# Patient Record
Sex: Female | Born: 1945 | Race: White | Hispanic: No | Marital: Married | Smoking: Former smoker
Health system: Southern US, Community
[De-identification: ages and names within clinical notes are randomized; demographics above are authoritative.]

## PROBLEM LIST (undated history)

## (undated) DIAGNOSIS — D219 Benign neoplasm of connective and other soft tissue, unspecified: Secondary | ICD-10-CM

## (undated) DIAGNOSIS — F419 Anxiety disorder, unspecified: Secondary | ICD-10-CM

## (undated) DIAGNOSIS — Z8719 Personal history of other diseases of the digestive system: Secondary | ICD-10-CM

## (undated) DIAGNOSIS — I10 Essential (primary) hypertension: Secondary | ICD-10-CM

## (undated) DIAGNOSIS — E039 Hypothyroidism, unspecified: Secondary | ICD-10-CM

## (undated) DIAGNOSIS — M1990 Unspecified osteoarthritis, unspecified site: Secondary | ICD-10-CM

## (undated) DIAGNOSIS — D649 Anemia, unspecified: Secondary | ICD-10-CM

## (undated) DIAGNOSIS — F32A Depression, unspecified: Secondary | ICD-10-CM

## (undated) DIAGNOSIS — H269 Unspecified cataract: Secondary | ICD-10-CM

## (undated) DIAGNOSIS — G2581 Restless legs syndrome: Secondary | ICD-10-CM

## (undated) DIAGNOSIS — G629 Polyneuropathy, unspecified: Secondary | ICD-10-CM

## (undated) DIAGNOSIS — N6021 Fibroadenosis of right breast: Secondary | ICD-10-CM

## (undated) DIAGNOSIS — F329 Major depressive disorder, single episode, unspecified: Secondary | ICD-10-CM

## (undated) DIAGNOSIS — C50911 Malignant neoplasm of unspecified site of right female breast: Secondary | ICD-10-CM

## (undated) DIAGNOSIS — E7800 Pure hypercholesterolemia, unspecified: Secondary | ICD-10-CM

## (undated) HISTORY — DX: Restless legs syndrome: G25.81

## (undated) HISTORY — PX: COLONOSCOPY: SHX174

## (undated) HISTORY — PX: TONSILLECTOMY: SUR1361

## (undated) HISTORY — PX: BUNIONECTOMY: SHX129

## (undated) HISTORY — DX: Pure hypercholesterolemia, unspecified: E78.00

## (undated) HISTORY — PX: CATARACT EXTRACTION: SUR2

## (undated) HISTORY — DX: Anemia, unspecified: D64.9

## (undated) HISTORY — DX: Essential (primary) hypertension: I10

## (undated) HISTORY — DX: Unspecified cataract: H26.9

## (undated) HISTORY — DX: Depression, unspecified: F32.A

## (undated) HISTORY — DX: Major depressive disorder, single episode, unspecified: F32.9

## (undated) HISTORY — DX: Anxiety disorder, unspecified: F41.9

## (undated) HISTORY — DX: Benign neoplasm of connective and other soft tissue, unspecified: D21.9

---

## 1967-09-13 HISTORY — PX: THYROIDECTOMY, PARTIAL: SHX18

## 1983-09-13 DIAGNOSIS — D219 Benign neoplasm of connective and other soft tissue, unspecified: Secondary | ICD-10-CM

## 1983-09-13 HISTORY — DX: Benign neoplasm of connective and other soft tissue, unspecified: D21.9

## 1985-09-12 HISTORY — PX: BREAST LUMPECTOMY: SHX2

## 1998-11-19 ENCOUNTER — Other Ambulatory Visit: Admission: RE | Admit: 1998-11-19 | Discharge: 1998-11-19 | Payer: Self-pay | Admitting: Family Medicine

## 1999-12-21 ENCOUNTER — Other Ambulatory Visit: Admission: RE | Admit: 1999-12-21 | Discharge: 1999-12-21 | Payer: Self-pay | Admitting: Family Medicine

## 2000-12-28 ENCOUNTER — Other Ambulatory Visit: Admission: RE | Admit: 2000-12-28 | Discharge: 2000-12-28 | Payer: Self-pay | Admitting: Family Medicine

## 2002-01-23 ENCOUNTER — Other Ambulatory Visit: Admission: RE | Admit: 2002-01-23 | Discharge: 2002-01-23 | Payer: Self-pay | Admitting: Family Medicine

## 2003-01-31 ENCOUNTER — Other Ambulatory Visit: Admission: RE | Admit: 2003-01-31 | Discharge: 2003-01-31 | Payer: Self-pay | Admitting: Internal Medicine

## 2004-03-18 ENCOUNTER — Other Ambulatory Visit: Admission: RE | Admit: 2004-03-18 | Discharge: 2004-03-18 | Payer: Self-pay | Admitting: Family Medicine

## 2005-05-30 ENCOUNTER — Other Ambulatory Visit: Admission: RE | Admit: 2005-05-30 | Discharge: 2005-05-30 | Payer: Self-pay | Admitting: Family Medicine

## 2006-07-28 ENCOUNTER — Ambulatory Visit: Payer: Self-pay | Admitting: Internal Medicine

## 2006-08-16 ENCOUNTER — Encounter (INDEPENDENT_AMBULATORY_CARE_PROVIDER_SITE_OTHER): Payer: Self-pay | Admitting: *Deleted

## 2006-08-16 ENCOUNTER — Ambulatory Visit: Payer: Self-pay | Admitting: Internal Medicine

## 2008-05-17 ENCOUNTER — Encounter: Admission: RE | Admit: 2008-05-17 | Discharge: 2008-05-17 | Payer: Self-pay | Admitting: Rheumatology

## 2010-06-30 ENCOUNTER — Encounter: Admission: RE | Admit: 2010-06-30 | Discharge: 2010-06-30 | Payer: Self-pay | Admitting: Orthopedic Surgery

## 2010-07-15 ENCOUNTER — Encounter: Payer: Self-pay | Admitting: Internal Medicine

## 2010-07-16 ENCOUNTER — Encounter (INDEPENDENT_AMBULATORY_CARE_PROVIDER_SITE_OTHER): Payer: Self-pay | Admitting: *Deleted

## 2010-08-30 ENCOUNTER — Encounter
Admission: RE | Admit: 2010-08-30 | Discharge: 2010-10-12 | Payer: Self-pay | Source: Home / Self Care | Attending: Orthopedic Surgery | Admitting: Orthopedic Surgery

## 2010-10-14 ENCOUNTER — Ambulatory Visit: Payer: BC Managed Care – PPO | Attending: Orthopedic Surgery | Admitting: Physical Therapy

## 2010-10-14 DIAGNOSIS — IMO0001 Reserved for inherently not codable concepts without codable children: Secondary | ICD-10-CM | POA: Insufficient documentation

## 2010-10-14 DIAGNOSIS — M25669 Stiffness of unspecified knee, not elsewhere classified: Secondary | ICD-10-CM | POA: Insufficient documentation

## 2010-10-14 DIAGNOSIS — R5381 Other malaise: Secondary | ICD-10-CM | POA: Insufficient documentation

## 2010-10-14 DIAGNOSIS — M25569 Pain in unspecified knee: Secondary | ICD-10-CM | POA: Insufficient documentation

## 2010-10-14 DIAGNOSIS — M6281 Muscle weakness (generalized): Secondary | ICD-10-CM | POA: Insufficient documentation

## 2010-10-14 NOTE — Letter (Signed)
Summary: New Patient letter  [facility]  [address] [place], [place] [address]   Phone: [phone]  Fax: [phone]       07/16/2010 MRN: [id number]  Phyllis Walker [address] [place], [place]  [address]  Dear Phyllis Walker,  Welcome to the Gastroenterology Division at [facility].    You are scheduled to see Dr. Marina Goodell on 09/07/2010 at 9:15AM on the 3rd floor at [facility], [address]  We ask that you try to arrive at our office 15 minutes prior to your appointment time to allow for check-in.  We would like you to complete the enclosed self-administered evaluation form prior to your visit and bring it with you on the day of your appointment.  We will review it with you.  Also, please bring a complete list of all your medications or, if you prefer, bring the medication bottles and we will list them.  Please bring your insurance card so that we may make a copy of it.  If your insurance requires a referral to see a specialist, please bring your referral form from your primary care physician.  Co-payments are due at the time of your visit and may be paid by cash, check or credit card.     Your office visit will consist of a consult with your physician (includes a physical exam), any laboratory testing he/she may order, scheduling of any necessary diagnostic testing (e.g. x-ray, ultrasound, CT-scan), and scheduling of a procedure (e.g. Endoscopy, Colonoscopy) if required.  Please allow enough time on your schedule to allow for any/all of these possibilities.    If you cannot keep your appointment, please call [phone] to cancel or reschedule prior to your appointment date.  This allows [place] the opportunity to schedule an appointment for another patient in need of care.  If you do not cancel or reschedule by 5 p.m. the business day prior to your appointment date, you will be charged a $50.00 late cancellation/no-show fee.    Thank you for choosing [facility] for your medical needs.  We appreciate the opportunity to care for you.  Please visit [place] at our website  to learn more about our practice.                     Sincerely,                                                             The Gastroenterology Division

## 2010-10-14 NOTE — Letter (Signed)
Summary: [facility]  [facility]   Imported By: Lennie Odor 07/29/2010 15:24:29  _____________________________________________________________________  External Attachment:    Type:   Image     Comment:   External Document

## 2011-03-08 ENCOUNTER — Ambulatory Visit: Payer: BC Managed Care – PPO | Attending: Orthopedic Surgery | Admitting: Physical Therapy

## 2011-03-08 DIAGNOSIS — M545 Low back pain, unspecified: Secondary | ICD-10-CM | POA: Insufficient documentation

## 2011-03-08 DIAGNOSIS — IMO0001 Reserved for inherently not codable concepts without codable children: Secondary | ICD-10-CM | POA: Insufficient documentation

## 2011-03-08 DIAGNOSIS — R5381 Other malaise: Secondary | ICD-10-CM | POA: Insufficient documentation

## 2011-03-09 ENCOUNTER — Ambulatory Visit: Payer: BC Managed Care – PPO | Admitting: Physical Therapy

## 2011-03-14 ENCOUNTER — Ambulatory Visit: Payer: BC Managed Care – PPO | Attending: Orthopedic Surgery | Admitting: Physical Therapy

## 2011-03-14 DIAGNOSIS — M545 Low back pain, unspecified: Secondary | ICD-10-CM | POA: Insufficient documentation

## 2011-03-14 DIAGNOSIS — IMO0001 Reserved for inherently not codable concepts without codable children: Secondary | ICD-10-CM | POA: Insufficient documentation

## 2011-03-14 DIAGNOSIS — R5381 Other malaise: Secondary | ICD-10-CM | POA: Insufficient documentation

## 2011-03-17 ENCOUNTER — Ambulatory Visit: Payer: BC Managed Care – PPO | Admitting: Physical Therapy

## 2011-03-22 ENCOUNTER — Ambulatory Visit: Payer: BC Managed Care – PPO | Admitting: *Deleted

## 2011-03-23 ENCOUNTER — Ambulatory Visit: Payer: BC Managed Care – PPO | Admitting: Physical Therapy

## 2011-03-29 ENCOUNTER — Ambulatory Visit: Payer: BC Managed Care – PPO | Admitting: Physical Therapy

## 2011-03-31 ENCOUNTER — Ambulatory Visit: Payer: BC Managed Care – PPO | Admitting: Physical Therapy

## 2011-04-05 ENCOUNTER — Ambulatory Visit: Payer: BC Managed Care – PPO | Admitting: *Deleted

## 2011-04-07 ENCOUNTER — Ambulatory Visit: Payer: BC Managed Care – PPO | Admitting: Physical Therapy

## 2011-12-21 ENCOUNTER — Ambulatory Visit (INDEPENDENT_AMBULATORY_CARE_PROVIDER_SITE_OTHER): Payer: BC Managed Care – PPO | Admitting: Women's Health

## 2011-12-21 ENCOUNTER — Encounter: Payer: Self-pay | Admitting: Women's Health

## 2011-12-21 VITALS — BP 138/86 | Ht 64.75 in | Wt 200.0 lb

## 2011-12-21 DIAGNOSIS — N949 Unspecified condition associated with female genital organs and menstrual cycle: Secondary | ICD-10-CM

## 2011-12-21 DIAGNOSIS — R1032 Left lower quadrant pain: Secondary | ICD-10-CM

## 2011-12-21 DIAGNOSIS — R102 Pelvic and perineal pain: Secondary | ICD-10-CM

## 2011-12-21 NOTE — Patient Instructions (Addendum)
zostovac  shinglesConstipation in Adults Constipation is having fewer than 2 bowel movements per week. Usually, the stools are hard. As we grow older, constipation is more common. If you try to fix constipation with laxatives, the problem may get worse. This is because laxatives taken over a long period of time make the colon muscles weaker. A low-fiber diet, not taking in enough fluids, and taking some medicines may make these problems worse. MEDICATIONS THAT MAY CAUSE CONSTIPATION  Water pills (diuretics).   Calcium channel blockers (used to control blood pressure and for the heart).   Certain pain medicines (narcotics).   Anticholinergics.   Anti-inflammatory agents.   Antacids that contain aluminum.  DISEASES THAT CONTRIBUTE TO CONSTIPATION  Diabetes.   Parkinson's disease.   Dementia.   Stroke.   Depression.   Illnesses that cause problems with salt and water metabolism.  HOME CARE INSTRUCTIONS   Constipation is usually best cared for without medicines. Increasing dietary fiber and eating more fruits and vegetables is the best way to manage constipation.   Slowly increase fiber intake to 25 to 38 grams per day. Whole grains, fruits, vegetables, and legumes are good sources of fiber. A dietitian can further help you incorporate high-fiber foods into your diet.   Drink enough water and fluids to keep your urine clear or pale yellow.   A fiber supplement may be added to your diet if you cannot get enough fiber from foods.   Increasing your activities also helps improve regularity.   Suppositories, as suggested by your caregiver, will also help. If you are using antacids, such as aluminum or calcium containing products, it will be helpful to switch to products containing magnesium if your caregiver says it is okay.   If you have been given a liquid injection (enema) today, this is only a temporary measure. It should not be relied on for treatment of longstanding (chronic)  constipation.   Stronger measures, such as magnesium sulfate, should be avoided if possible. This may cause uncontrollable diarrhea. Using magnesium sulfate may not allow you time to make it to the bathroom.  SEEK IMMEDIATE MEDICAL CARE IF:   There is bright red blood in the stool.   The constipation stays for more than 4 days.   There is belly (abdominal) or rectal pain.   You do not seem to be getting better.   You have any questions or concerns.  MAKE SURE YOU:   Understand these instructions.   Will watch your condition.   Will get help right away if you are not doing well or get worse.  Document Released: 05/27/2004 Document Revised: 08/18/2011 Document Reviewed: 08/02/2011 [facility] Patient Information 2012 [place], [place].Pelvic Pain Pelvic pain is pain below the belly button and located between your hips. Acute pain may last a few hours or days. Chronic pelvic pain may last weeks and months. The cause may be different for different types of pain. The pain may be dull or sharp, mild or severe and can interfere with your daily activities. Write down and tell your caregiver:   Exactly where the pain is located.   If it comes and goes or is there all the time.   When it happens (with sex, urination, bowel movement, etc.)   If the pain is related to your menstrual period or stress.  Your caregiver will take a full history and do a complete physical exam and Pap test. CAUSES   Painful menstrual periods (dysmenorrhea).   Normal ovulation (Mittelschmertz) that occurs  in the middle of the menstrual cycle every month.   The pelvic organs get engorged with blood just before the menstrual period (pelvic congestive syndrome).   Scar tissue from an infection or past surgery (pelvic adhesions).   Cancer of the female pelvic organs. When there is pain with cancer, it has been there for a long time.   The lining of the uterus (endometrium) abnormally grows in places like the pelvis  and on the pelvic organs (endometriosis).   A form of endometriosis with the lining of the uterus present inside of the muscle tissue of the uterus (adenomyosis).   Fibroid tumor (noncancerous) in the uterus.   Bladder problems such as infection, bladder spasms of the muscle tissue of the bladder.   Intestinal problems (irritable bowel syndrome, colitis, an ulcer or gastrointestinal infection).   Polyps of the cervix or uterus.   Pregnancy in the tube (ectopic pregnancy).   The opening of the cervix is too small for the menstrual blood to flow through it (cervical stenosis).   Physical or sexual abuse (past or present).   Musculo-skeletal problems from poor posture, problems with the vertebrae of the lower back or the uterine pelvic muscles falling (prolapse).   Psychological problems such as depression or stress.   IUD (intrauterine device) in the uterus.  DIAGNOSIS  Tests to make a diagnosis depends on the type, location, severity and what causes the pain to occur. Tests that may be needed include:  Blood tests.   Urine tests   Ultrasound.   X-rays.   CT Scan.   MRI.   Laparoscopy.   Major surgery.  TREATMENT  Treatment will depend on the cause of the pain, which includes:  Prescription or over-the-counter pain medication.   Antibiotics.   Birth control pills.   Hormone treatment.   Nerve blocking injections.   Physical therapy.   Antidepressants.   Counseling with a psychiatrist or psychologist.   Minor or major surgery.  HOME CARE INSTRUCTIONS   Only take over-the-counter or prescription medicines for pain, discomfort or fever as directed by your caregiver.   Follow your caregiver's advice to treat your pain.   Rest.   Avoid sexual intercourse if it causes the pain.   Apply warm or cold compresses (which ever works best) to the pain area.   Do relaxation exercises such as yoga or meditation.   Try acupuncture.   Avoid stressful  situations.   Try group therapy.   If the pain is because of a stomach/intestinal upset, drink clear liquids, eat a bland light food diet until the symptoms go away.  SEEK MEDICAL CARE IF:   You need stronger prescription pain medication.   You develop pain with sexual intercourse.   You have pain with urination.   You develop a temperature of 102 F (38.9 C) with the pain.   You are still in pain after 4 hours of taking prescription medication for the pain.   You need depression medication.   Your IUD is causing pain and you want it removed.  SEEK IMMEDIATE MEDICAL CARE IF:  You develop very severe pain or tenderness.   You faint, have chills, severe weakness or dehydration.   You develop heavy vaginal bleeding or passing solid tissue.   You develop a temperature of 102 F (38.9 C) with the pain.   You have blood in the urine.   You are being physically or sexually abused.   You have uncontrolled vomiting and diarrhea.  You are depressed and afraid of harming yourself or someone else.  Document Released: 10/06/2004 Document Revised: 08/18/2011 Document Reviewed: 07/03/2008 [facility] Patient Information 2012 [place], [place].

## 2011-12-21 NOTE — Progress Notes (Signed)
Patient ID: Phyllis Walker, female   DOB: 22-May-1946, 66 y.o.   MRN: [id number] Presents as a new patient - problem visit of left groin discomfort. Had annual exam with primary care for hypertension/ hypothyroidism/hypercholesterolemia/ anxiety/depression/restless leg syndrome with labs and meds. Was seen an employee health at [facility] and referred here. Postmenopausal on no HRT and no bleeding for greater than 10 years. Normal colonoscopy in 2011. Normal mammogram and Pap October 2012.  History of present problem: Intermittent dull pain for approximately 4 months in left lower abdomen/groin area, no pain today. Denies any nausea/ vomiting or fever, has had problems with intermittent constipation for years. States this has increased constipation since she has been on Lyrica, but has had less foot pain. States had pain yesterday, took a home fleets enema,  pain relief after fleets.  Exam: No CVAT, slight discomfort in the left lower hip that radiates down after sitting for long periods of time. Abdomen obese, no tenderness, rebound, or radiation of pain, no visible hernia. External genitalia within normal limits, speculum exam vaginal wall slight atrophy without discharge. Bimanual uterus nontender, no adnexal fullness or tenderness, somewhat limited exam due to abdominal girth. Rectal exam no masses or tenderness noted.  Left lower abdominal pain questionable constipation origin  Plan: Discussion on importance of increasing fiber rich foods, establishing a regular BM pattern, weight loss for health, Weight Watchers reviewed, and increasing regular daily walking, calcium rich diet encouraged. Pelvic ultrasound to rule out an ovarian problem. Will schedule.

## 2011-12-28 ENCOUNTER — Other Ambulatory Visit: Payer: Self-pay | Admitting: Gynecology

## 2011-12-28 ENCOUNTER — Ambulatory Visit (INDEPENDENT_AMBULATORY_CARE_PROVIDER_SITE_OTHER): Payer: BC Managed Care – PPO

## 2011-12-28 ENCOUNTER — Ambulatory Visit (INDEPENDENT_AMBULATORY_CARE_PROVIDER_SITE_OTHER): Payer: BC Managed Care – PPO | Admitting: Women's Health

## 2011-12-28 ENCOUNTER — Encounter: Payer: Self-pay | Admitting: Women's Health

## 2011-12-28 DIAGNOSIS — N83339 Acquired atrophy of ovary and fallopian tube, unspecified side: Secondary | ICD-10-CM

## 2011-12-28 DIAGNOSIS — N949 Unspecified condition associated with female genital organs and menstrual cycle: Secondary | ICD-10-CM

## 2011-12-28 DIAGNOSIS — N8300 Follicular cyst of ovary, unspecified side: Secondary | ICD-10-CM

## 2011-12-28 DIAGNOSIS — R102 Pelvic and perineal pain: Secondary | ICD-10-CM

## 2011-12-28 NOTE — Progress Notes (Signed)
Patient ID: Phyllis Walker, female   DOB: Jul 24, 1946, 66 y.o.   MRN: [id number] Was seen as a new patient on 410/13 for left groin/ pelvic pain and presents for ultrasound today. Normal colonoscopy in 2010. Problems with constipation for years that has increased since taking Lyrica. Denies any urinary symptoms or vaginal discharge. Not sexually active.  Ultrasound: Retroflexed uterus with 5.0 mm endometrial cavity. Right ovary difficult ID atrophic. Left ovary thin-walled echo-free follicle 12 x 9 mm. Negative cul-de-sac.   Left groin/pelvic pain  Plan: Reviewed probably not reason for pain, reviewed more likely GI/constipation causing discomfort. Encourage increased fiber greater than 25 g per day, increase fluids, avoid caffeinated beverages, MiraLax daily until regular BMs. Ultrasound reviewed with Dr. Lily Peer. We'll repeat ultrasound in 2-3 months to check for stability of follicle.

## 2012-01-17 ENCOUNTER — Emergency Department (HOSPITAL_COMMUNITY)
Admission: EM | Admit: 2012-01-17 | Discharge: 2012-01-17 | Disposition: A | Discharge status: Home / Self Care | Payer: BC Managed Care – PPO | Attending: Emergency Medicine | Admitting: Emergency Medicine

## 2012-01-17 ENCOUNTER — Encounter (HOSPITAL_COMMUNITY): Payer: Self-pay | Admitting: *Deleted

## 2012-01-17 DIAGNOSIS — D699 Hemorrhagic condition, unspecified: Secondary | ICD-10-CM | POA: Insufficient documentation

## 2012-01-17 DIAGNOSIS — E7800 Pure hypercholesterolemia, unspecified: Secondary | ICD-10-CM | POA: Insufficient documentation

## 2012-01-17 DIAGNOSIS — T148XXA Other injury of unspecified body region, initial encounter: Secondary | ICD-10-CM

## 2012-01-17 DIAGNOSIS — Z79899 Other long term (current) drug therapy: Secondary | ICD-10-CM | POA: Insufficient documentation

## 2012-01-17 DIAGNOSIS — F341 Dysthymic disorder: Secondary | ICD-10-CM | POA: Insufficient documentation

## 2012-01-17 DIAGNOSIS — Z9889 Other specified postprocedural states: Secondary | ICD-10-CM | POA: Insufficient documentation

## 2012-01-17 DIAGNOSIS — I10 Essential (primary) hypertension: Secondary | ICD-10-CM | POA: Insufficient documentation

## 2012-01-17 DIAGNOSIS — E079 Disorder of thyroid, unspecified: Secondary | ICD-10-CM | POA: Insufficient documentation

## 2012-01-17 LAB — CBC
HCT: 34.7 % — ABNORMAL LOW (ref 36.0–46.0)
MCH: 29.3 pg (ref 26.0–34.0)
MCHC: 33.7 g/dL (ref 30.0–36.0)
MCV: 86.8 fL (ref 78.0–100.0)
Platelets: 220 10*3/uL (ref 150–400)
RDW: 14 % (ref 11.5–15.5)
WBC: 10.3 10*3/uL (ref 4.0–10.5)

## 2012-01-17 LAB — DIFFERENTIAL
Basophils Absolute: 0.1 10*3/uL (ref 0.0–0.1)
Basophils Relative: 1 % (ref 0–1)
Eosinophils Absolute: 0.2 10*3/uL (ref 0.0–0.7)
Eosinophils Relative: 2 % (ref 0–5)
Monocytes Absolute: 0.8 10*3/uL (ref 0.1–1.0)

## 2012-01-17 LAB — PROTIME-INR: Prothrombin Time: 14.3 seconds (ref 11.6–15.2)

## 2012-01-17 MED ORDER — "THROMBI-PAD 3""X3"" EX PADS": 1.0000 | MEDICATED_PAD | Freq: Once | CUTANEOUS | Status: DC

## 2012-01-17 NOTE — ED Notes (Signed)
Reports had orthopedic sx today on rgt foot; dressing intact - partially saturated with blood; dressing removed; incision to medial aspect of foot at great toe area - sutures intact - small amt of bright red blood oozing from proximal aspect of incision; second incision to dorsal aspect of second toe as well as at base of toe - all sutures intact; pin intact to distal aspect of second toe - insertion into nailbed with partial protrusion from plantar aspect of second toe; small amt of bright red blood oozing from incision to dorsal aspect of second toe; strong palpable pedal pulse

## 2012-01-17 NOTE — Progress Notes (Signed)
Orthopedic Tech Progress Note Patient Details:  Phyllis Walker July 28, 1946 [id number]  Other Ortho Devices Type of Ortho Device: Postop boot Ortho Device Location: right foot Ortho Device Interventions: Application   Phyllis Walker 01/17/2012, 10:42 PM

## 2012-01-17 NOTE — ED Notes (Signed)
The pt had surgery for a bunion removal and a release of a hammer toe this am by dr Victorino Dike..  The wound appears to still be bleeding .  She had a block but she says the effects have not worn off yet

## 2012-01-17 NOTE — ED Notes (Signed)
No further bleeding noted at this time; dry dressing applied to rgt foot

## 2012-01-17 NOTE — ED Provider Notes (Signed)
History  This chart was scribed for Phyllis Booze, MD by Bennett Scrape. This patient was seen in room STRE8/STRE8 and the patient's care was started at 7:53PM.  CSN: [id number]  Arrival date & time 01/17/12  1847   First MD Initiated Contact with Patient 01/17/12 1953      Chief Complaint  Patient presents with  . bleeding foot     The history is provided by the patient. No language interpreter was used.    Phyllis Walker is a 66 y.o. female who presents to the Emergency Department complaining of a bleeding surgery wound on the right foot. Pt states that she had a bunion removal and relaease of a hammer toe by Dr. Victorino Dike this morning. Pt states that she had a block but says that the effects have not worn off yet. She denies having any pain. She denies having any bleeding problems before this. She denies any other injuries or illnesses at this time. She has a h/o HTN and hypercholesteremia.    Past Medical History  Diagnosis Date  . Hyperthyroidism   . Hypertension   . Depression   . Anxiety   . Anemia   . Hypercholesteremia     Past Surgical History  Procedure Date  . Thyroidectomy, partial 1969  . Breast lumpectomy 1987    benign fibroid tumor    Family History  Problem Relation Age of Onset  . Cancer Mother     pancreas  . Heart attack Father   . Hypertension Sister   . Ovarian cancer Maternal Aunt     History  Substance Use Topics  . Smoking status: Former Smoker    Quit date: 12/21/2006  . Smokeless tobacco: Never Used  . Alcohol Use: No    OB History    Grav Para Term Preterm Abortions TAB SAB Ect Mult Living   3 2   1  1   2       Review of Systems  Constitutional: Negative for fever and chills.  Respiratory: Negative for cough and shortness of breath.   Gastrointestinal: Negative for nausea and vomiting.  Skin: Positive for wound.       Bleeding toe after surgery today  Neurological: Negative for weakness.    Allergies  Review of patient's  allergies indicates no known allergies.  Home Medications   Current Outpatient Rx  Name Route Sig Dispense Refill  . ALPRAZOLAM 0.25 MG PO TABS Oral Take 0.25 mg by mouth 2 (two) times daily.    [name] BENAZEPRIL-HYDROCHLOROTHIAZIDE 20-12.5 MG PO TABS Oral Take 1 tablet by mouth daily.    . OMEGA-3 FATTY ACIDS 1000 MG PO CAPS Oral Take 2 g by mouth 2 (two) times daily.    [name] LEVOTHYROXINE SODIUM 75 MCG PO TABS Oral Take 75 mcg by mouth daily.    . OXYCODONE HCL 5 MG PO TABS Oral Take 5 mg by mouth every 4 (four) hours as needed. For pain    . PREGABALIN 75 MG PO CAPS Oral Take 75 mg by mouth daily.    [name] ROPINIROLE HCL 1 MG PO TABS Oral Take 1 mg by mouth every evening.     [name] SERTRALINE HCL 50 MG PO TABS Oral Take 25 mg by mouth daily.     [name] SIMVASTATIN 40 MG PO TABS Oral Take 40 mg by mouth every evening.    [name] ZOLPIDEM TARTRATE 10 MG PO TABS Oral Take 10 mg by mouth at bedtime as needed. For sleep  Triage Vitals: BP 127/63  Pulse 89  Temp(Src) 98.6 F (37 C) (Oral)  Resp 20  SpO2 99%  Physical Exam  Nursing note and vitals reviewed. Constitutional: She is oriented to person, place, and time. She appears well-developed and well-nourished. No distress.  HENT:  Head: Normocephalic and atraumatic.  Eyes: EOM are normal.  Neck: Neck supple. No tracheal deviation present.  Cardiovascular: Normal rate.   Pulmonary/Chest: Effort normal. No respiratory distress.  Musculoskeletal: Normal range of motion.  Neurological: She is alert and oriented to person, place, and time.  Skin: Skin is warm and dry.       Blood soaked dressing in place on right foot, when dressing is removed, blood pooling between the second and third toe, blood is oozing from right second toe along the dorsal aspect, no other bleeding site seen, incision over medial dorsal aspect of the right toe and the dorsum are dry.  Psychiatric: She has a normal mood and affect. Her behavior is normal.    ED Course  Procedures  (including critical care time)  DIAGNOSTIC STUDIES: Oxygen Saturation is 99% on room air, normal by my interpretation.    COORDINATION OF CARE: 7:59PM-Discussed lab work and coagulant powder with pt and pt agrees to plan.   Labs Reviewed - No data to display No results found.   Post op bleeding   MDM  Patient with no further bleeding - labs are re-assuring and patient is to follow up with Dr. Victorino Dike for any further complications - new post op show obtained.     Phyllis Walker [place], [place] 01/17/12 2212

## 2012-01-17 NOTE — Discharge Instructions (Signed)
Follow up as scheduled with Dr. Victorino Dike - return here with any further bleeding or concerns.

## 2012-01-17 NOTE — ED Notes (Signed)
Quick clot applied to incision to medial aspect of rgt foot at great toe area as well as to incision to dorsal aspect of second toe rgt foot

## 2012-01-21 NOTE — ED Provider Notes (Signed)
Medical screening examination/treatment/procedure(s) were conducted as a shared visit with non-physician practitioner(s) and myself.  I personally evaluated the patient during the encounter  I performed the initial history and physical examination. Mid-level arrange for disposition after appropriate hemostasis had been obtained.  [name], MD 01/21/12 [phone]

## 2012-12-03 ENCOUNTER — Encounter: Payer: Self-pay | Admitting: Nurse Practitioner

## 2012-12-21 ENCOUNTER — Encounter: Payer: Self-pay | Admitting: Nurse Practitioner

## 2012-12-21 ENCOUNTER — Ambulatory Visit (INDEPENDENT_AMBULATORY_CARE_PROVIDER_SITE_OTHER): Payer: Medicare Other | Admitting: Nurse Practitioner

## 2012-12-21 VITALS — BP 110/65 | HR 87 | Temp 97.1°F | Ht 65.0 in | Wt 208.0 lb

## 2012-12-21 DIAGNOSIS — F411 Generalized anxiety disorder: Secondary | ICD-10-CM | POA: Insufficient documentation

## 2012-12-21 DIAGNOSIS — E785 Hyperlipidemia, unspecified: Secondary | ICD-10-CM

## 2012-12-21 DIAGNOSIS — F32A Depression, unspecified: Secondary | ICD-10-CM | POA: Insufficient documentation

## 2012-12-21 DIAGNOSIS — G4700 Insomnia, unspecified: Secondary | ICD-10-CM

## 2012-12-21 DIAGNOSIS — I10 Essential (primary) hypertension: Secondary | ICD-10-CM | POA: Insufficient documentation

## 2012-12-21 DIAGNOSIS — F329 Major depressive disorder, single episode, unspecified: Secondary | ICD-10-CM

## 2012-12-21 DIAGNOSIS — E039 Hypothyroidism, unspecified: Secondary | ICD-10-CM | POA: Insufficient documentation

## 2012-12-21 DIAGNOSIS — G2581 Restless legs syndrome: Secondary | ICD-10-CM

## 2012-12-21 LAB — COMPLETE METABOLIC PANEL WITH GFR
Alkaline Phosphatase: 112 U/L (ref 39–117)
BUN: 19 mg/dL (ref 6–23)
CO2: 28 mEq/L (ref 19–32)
GFR, Est African American: 83 mL/min
GFR, Est Non African American: 72 mL/min
Glucose, Bld: 79 mg/dL (ref 70–99)
Sodium: 144 mEq/L (ref 135–145)
Total Bilirubin: 0.4 mg/dL (ref 0.3–1.2)
Total Protein: 6.4 g/dL (ref 6.0–8.3)

## 2012-12-21 LAB — THYROID PANEL WITH TSH: T4, Total: 10.9 ug/dL (ref 5.0–12.5)

## 2012-12-21 MED ORDER — ALPRAZOLAM 0.25 MG PO TABS: 0.2500 mg | ORAL_TABLET | Freq: Two times a day (BID) | ORAL | Status: DC

## 2012-12-21 MED ORDER — ZOLPIDEM TARTRATE 10 MG PO TABS: 10.0000 mg | ORAL_TABLET | Freq: Every evening | ORAL | Status: DC | PRN

## 2012-12-21 MED ORDER — ROPINIROLE HCL 1 MG PO TABS: 1.0000 mg | ORAL_TABLET | Freq: Every evening | ORAL | Status: DC

## 2012-12-21 MED ORDER — SIMVASTATIN 40 MG PO TABS: 40.0000 mg | ORAL_TABLET | Freq: Every evening | ORAL | Status: DC

## 2012-12-21 MED ORDER — SERTRALINE HCL 50 MG PO TABS: 50.0000 mg | ORAL_TABLET | Freq: Every day | ORAL | Status: DC

## 2012-12-21 MED ORDER — BENAZEPRIL-HYDROCHLOROTHIAZIDE 20-12.5 MG PO TABS: 1.0000 | ORAL_TABLET | Freq: Every day | ORAL | Status: DC

## 2012-12-21 MED ORDER — LEVOTHYROXINE SODIUM 75 MCG PO TABS: 75.0000 ug | ORAL_TABLET | Freq: Every day | ORAL | Status: DC

## 2012-12-21 NOTE — Patient Instructions (Signed)

## 2012-12-21 NOTE — Progress Notes (Signed)
Subjective:    Patient ID: Phyllis Walker, female    DOB: Feb 15, 1946, 67 y.o.   MRN: [id number]  Hyperlipidemia This is a chronic problem. The current episode started more than 1 year ago. The problem is controlled. Recent lipid tests were reviewed and are normal. Exacerbating diseases include hypothyroidism. Pertinent negatives include no chest pain, leg pain, myalgias or shortness of breath. Current antihyperlipidemic treatment includes statins. There are no compliance problems.  Risk factors for coronary artery disease include hypertension.  Anxiety Presents for follow-up visit. Symptoms include insomnia. Patient reports no chest pain, depressed mood, excessive worry, nervous/anxious behavior, palpitations or shortness of breath. Symptoms occur occasionally. The severity of symptoms is mild. The quality of sleep is fair. Nighttime awakenings: occasional.    Hypertension This is a chronic problem. The current episode started more than 1 year ago. The problem has been resolved since onset. The problem is controlled. Associated symptoms include anxiety. Pertinent negatives include no chest pain, palpitations, peripheral edema or shortness of breath. Risk factors for coronary artery disease include dyslipidemia. Past treatments include ACE inhibitors and diuretics. The current treatment provides significant improvement. There are no compliance problems.    Depression/Anxiety This is a chronic problem. Pt denies depressed mood, anxiety, or sleep disturbances. Has recently retired and feels less stressed.    Review of Systems  Respiratory: Negative for shortness of breath.   Cardiovascular: Negative for chest pain and palpitations.  Musculoskeletal: Negative for myalgias.  Psychiatric/Behavioral: The patient has insomnia. The patient is not nervous/anxious.   All other systems reviewed and are negative.       Objective:   Physical Exam  Constitutional: She is oriented to person, place, and  time. She appears well-developed and well-nourished.  HENT:  Nose: Nose normal.  Mouth/Throat: Oropharynx is clear and moist.  Eyes: EOM are normal.  Neck: Trachea normal, normal range of motion and full passive range of motion without pain. Neck supple. No JVD present. Carotid bruit is not present. No thyromegaly present.  Cardiovascular: Normal rate, regular rhythm, normal heart sounds and intact distal pulses.  Exam reveals no gallop and no friction rub.   No murmur heard. Pulmonary/Chest: Effort normal and breath sounds normal.  Abdominal: Soft. Bowel sounds are normal. She exhibits no distension and no mass. There is no tenderness.  Musculoskeletal: Normal range of motion.  Lymphadenopathy:    She has no cervical adenopathy.  Neurological: She is alert and oriented to person, place, and time. She has normal reflexes.  Skin: Skin is warm and dry.  Psychiatric: She has a normal mood and affect. Her behavior is normal. Judgment and thought content normal.    BP 110/65  Pulse 87  Temp(Src) 97.1 F (36.2 C) (Oral)  Ht 5\' 5"  (1.651 m)  Wt 208 lb (94.348 kg)  BMI 34.61 kg/m2       Assessment & Plan:  1. Other and unspecified hyperlipidemia Low fat diet Exercise - simvastatin (ZOCOR) 40 MG tablet; Take 1 tablet (40 mg total) by mouth every evening.  Dispense: 90 tablet; Refill: 1 - NMR Lipoprofile with Lipids  2. Unspecified hypothyroidism - levothyroxine (SYNTHROID, LEVOTHROID) 75 MCG tablet; Take 1 tablet (75 mcg total) by mouth daily.  Dispense: 90 tablet; Refill: 1 - Thyroid Panel With TSH  3. GAD (generalized anxiety disorder) Stress Managment  4. Hypertension, benign essential, goal below 140/90 Low Na+ diet Exercise - benazepril-hydrochlorthiazide (LOTENSIN HCT) 20-12.5 MG per tablet; Take 1 tablet by mouth daily.  Dispense: 90 tablet;  Refill: 1 - ALPRAZolam (XANAX) 0.25 MG tablet; Take 1 tablet (0.25 mg total) by mouth 2 (two) times daily.  Dispense: 180 tablet;  Refill: 0 - COMPLETE METABOLIC PANEL WITH GFR  5. Depression Stress Management  6. Insomnia Bedtime Ritual - zolpidem (AMBIEN) 10 MG tablet; Take 1 tablet (10 mg total) by mouth at bedtime as needed. For sleep  Dispense: 30 tablet; Refill: 1  7. Restless leg syndrome - rOPINIRole (REQUIP) 1 MG tablet; Take 1 tablet (1 mg total) by mouth every evening.  Dispense: 90 tablet; Refill: 1  Mary-Margaret Daphine Deutscher, FNP

## 2012-12-25 LAB — NMR LIPOPROFILE WITH LIPIDS
HDL Size: 9.1 nm — ABNORMAL LOW (ref >=9.2)
HDL-C: 55 mg/dL (ref >=40)
LDL Particle Number: 1481 nmol/L — ABNORMAL HIGH (ref <1000)
LDL Size: 20.7 nm (ref >20.5)
Large HDL-P: 7.3 umol/L (ref >=4.8)
Large VLDL-P: <0.8 nmol/L (ref <=2.7)
VLDL Size: 38.4 nm (ref <=46.6)

## 2013-01-03 ENCOUNTER — Other Ambulatory Visit: Payer: Self-pay | Admitting: Nurse Practitioner

## 2013-01-04 NOTE — Telephone Encounter (Signed)
RX READY FOR PICK UP  

## 2013-01-04 NOTE — Telephone Encounter (Signed)
2 mail order scripts, call pt at [phone] when ready for pickup

## 2013-01-04 NOTE — Telephone Encounter (Signed)
Patient aware.

## 2013-01-07 ENCOUNTER — Telehealth: Payer: Self-pay | Admitting: Nurse Practitioner

## 2013-01-09 NOTE — Telephone Encounter (Signed)
Received clarification form from [facility] to clarify date on rx mail in by patient. MMM FILLED IN DATE AND FAXED IN. PT NOTIFIED.

## 2013-01-24 NOTE — Telephone Encounter (Signed)
Pt was calling for appointment and appointment was made per Verdon Cummins this encounter was opened in error

## 2013-01-31 ENCOUNTER — Ambulatory Visit (INDEPENDENT_AMBULATORY_CARE_PROVIDER_SITE_OTHER): Payer: Medicare Other | Admitting: Nurse Practitioner

## 2013-01-31 ENCOUNTER — Encounter: Payer: Self-pay | Admitting: Nurse Practitioner

## 2013-01-31 ENCOUNTER — Telehealth: Payer: Self-pay | Admitting: Nurse Practitioner

## 2013-01-31 VITALS — BP 120/69 | HR 88 | Temp 97.2°F | Ht 65.0 in | Wt 201.0 lb

## 2013-01-31 DIAGNOSIS — J019 Acute sinusitis, unspecified: Secondary | ICD-10-CM

## 2013-01-31 DIAGNOSIS — B9689 Other specified bacterial agents as the cause of diseases classified elsewhere: Secondary | ICD-10-CM

## 2013-01-31 MED ORDER — AZITHROMYCIN 250 MG PO TABS: ORAL_TABLET | ORAL | Status: DC

## 2013-01-31 MED ORDER — HYDROCODONE-HOMATROPINE 5-1.5 MG/5ML PO SYRP: 5.0000 mL | ORAL_SOLUTION | Freq: Three times a day (TID) | ORAL | Status: DC | PRN

## 2013-01-31 NOTE — Progress Notes (Signed)
  Subjective:    Patient ID: Phyllis Walker, female    DOB: 06-18-1946, 67 y.o.   MRN: [id number]  HPI- Patient in today with sinus congestion. Sore throat stared Sunday- Now has bad productive cough. Has been taking zyrtec OTC with no relief.    Review of Systems  Constitutional: Negative for fever and chills.  HENT: Positive for ear pain, congestion, sore throat, rhinorrhea, sneezing, voice change and sinus pressure.   Respiratory: Positive for cough (productive greenish). Negative for shortness of breath.   Cardiovascular: Negative for chest pain, palpitations and leg swelling.       Objective:   Physical Exam  Constitutional: She is oriented to person, place, and time. She appears well-developed and well-nourished.  HENT:  Right Ear: Hearing, tympanic membrane, external ear and ear canal normal.  Left Ear: Hearing, tympanic membrane, external ear and ear canal normal.  Nose: Mucosal edema and rhinorrhea present. Right sinus exhibits maxillary sinus tenderness and frontal sinus tenderness. Left sinus exhibits maxillary sinus tenderness and frontal sinus tenderness.  Mouth/Throat: Posterior oropharyngeal edema present.  Cardiovascular: Normal rate, normal heart sounds and intact distal pulses.   Pulmonary/Chest: Effort normal and breath sounds normal.  Neurological: She is alert and oriented to person, place, and time.  Skin: Skin is warm.  Psychiatric: She has a normal mood and affect. Her behavior is normal. Judgment and thought content normal.   BP 120/69  Pulse 88  Temp(Src) 97.2 F (36.2 C) (Oral)  Ht 5\' 5"  (1.651 m)  Wt 201 lb (91.173 kg)  BMI 33.45 kg/m2        Assessment & Plan:  Acute Rhinosinusitis - Z pak as directed -Hycodan 1 tsp Po Q6 prn cough Force Fluids Rest  Mary-Margaret Daphine Deutscher, FNP

## 2013-01-31 NOTE — Patient Instructions (Signed)

## 2013-01-31 NOTE — Telephone Encounter (Signed)
APPT GIVEN

## 2013-03-01 ENCOUNTER — Telehealth: Payer: Self-pay | Admitting: Nurse Practitioner

## 2013-03-01 DIAGNOSIS — G4700 Insomnia, unspecified: Secondary | ICD-10-CM

## 2013-03-01 DIAGNOSIS — I10 Essential (primary) hypertension: Secondary | ICD-10-CM

## 2013-03-01 MED ORDER — ZOLPIDEM TARTRATE 10 MG PO TABS: 10.0000 mg | ORAL_TABLET | Freq: Every evening | ORAL | Status: DC | PRN

## 2013-03-01 MED ORDER — ALPRAZOLAM 0.25 MG PO TABS: 0.2500 mg | ORAL_TABLET | Freq: Two times a day (BID) | ORAL | Status: DC

## 2013-03-01 NOTE — Telephone Encounter (Signed)
Forward to mmm 

## 2013-03-01 NOTE — Telephone Encounter (Signed)
rx ready for pickup 

## 2013-03-01 NOTE — Telephone Encounter (Signed)
Pt aware to pick up rx's 

## 2013-03-11 ENCOUNTER — Other Ambulatory Visit: Payer: Self-pay

## 2013-03-11 DIAGNOSIS — G4700 Insomnia, unspecified: Secondary | ICD-10-CM

## 2013-03-11 MED ORDER — ZOLPIDEM TARTRATE 10 MG PO TABS: 10.0000 mg | ORAL_TABLET | Freq: Every evening | ORAL | Status: DC | PRN

## 2013-03-11 NOTE — Telephone Encounter (Signed)
rx called into pharmacy

## 2013-03-11 NOTE — Telephone Encounter (Signed)
Please call in rx ambien with 2 refills

## 2013-03-11 NOTE — Telephone Encounter (Signed)
Last seen 01/31/13  Last filled 03/01/13  MMM  If approved have nurse phone in and notify patient

## 2013-04-17 ENCOUNTER — Telehealth: Payer: Self-pay | Admitting: Nurse Practitioner

## 2013-04-17 ENCOUNTER — Ambulatory Visit (INDEPENDENT_AMBULATORY_CARE_PROVIDER_SITE_OTHER): Payer: Medicare Other | Admitting: Nurse Practitioner

## 2013-04-17 ENCOUNTER — Encounter: Payer: Self-pay | Admitting: Nurse Practitioner

## 2013-04-17 VITALS — BP 126/72 | HR 107 | Temp 97.9°F | Wt 197.0 lb

## 2013-04-17 DIAGNOSIS — N390 Urinary tract infection, site not specified: Secondary | ICD-10-CM

## 2013-04-17 DIAGNOSIS — R350 Frequency of micturition: Secondary | ICD-10-CM

## 2013-04-17 LAB — POCT URINALYSIS DIPSTICK
Bilirubin, UA: NEGATIVE
Nitrite, UA: POSITIVE
Spec Grav, UA: 1.01
pH, UA: 7

## 2013-04-17 LAB — POCT UA - MICROSCOPIC ONLY
Crystals, Ur, HPF, POC: NEGATIVE
Mucus, UA: NEGATIVE

## 2013-04-17 MED ORDER — CIPROFLOXACIN HCL 500 MG PO TABS: 500.0000 mg | ORAL_TABLET | Freq: Two times a day (BID) | ORAL | Status: DC

## 2013-04-17 NOTE — Patient Instructions (Signed)
Urinary Tract Infection  Urinary tract infections (UTIs) can develop anywhere along your urinary tract. Your urinary tract is your body's drainage system for removing wastes and extra water. Your urinary tract includes two kidneys, two ureters, a bladder, and a urethra. Your kidneys are a pair of bean-shaped organs. Each kidney is about the size of your fist. They are located below your ribs, one on each side of your spine.  CAUSES  Infections are caused by microbes, which are microscopic organisms, including fungi, viruses, and bacteria. These organisms are so small that they can only be seen through a microscope. Bacteria are the microbes that most commonly cause UTIs.  SYMPTOMS   Symptoms of UTIs may vary by age and gender of the patient and by the location of the infection. Symptoms in young women typically include a frequent and intense urge to urinate and a painful, burning feeling in the bladder or urethra during urination. Older women and men are more likely to be tired, shaky, and weak and have muscle aches and abdominal pain. A fever may mean the infection is in your kidneys. Other symptoms of a kidney infection include pain in your back or sides below the ribs, nausea, and vomiting.  DIAGNOSIS  To diagnose a UTI, your caregiver will ask you about your symptoms. Your caregiver also will ask to provide a urine sample. The urine sample will be tested for bacteria and white blood cells. White blood cells are made by your body to help fight infection.  TREATMENT   Typically, UTIs can be treated with medication. Because most UTIs are caused by a bacterial infection, they usually can be treated with the use of antibiotics. The choice of antibiotic and length of treatment depend on your symptoms and the type of bacteria causing your infection.  HOME CARE INSTRUCTIONS   If you were prescribed antibiotics, take them exactly as your caregiver instructs you. Finish the medication even if you feel better after you  have only taken some of the medication.   Drink enough water and fluids to keep your urine clear or pale yellow.   Avoid caffeine, tea, and carbonated beverages. They tend to irritate your bladder.   Empty your bladder often. Avoid holding urine for long periods of time.   Empty your bladder before and after sexual intercourse.   After a bowel movement, women should cleanse from front to back. Use each tissue only once.  SEEK MEDICAL CARE IF:    You have back pain.   You develop a fever.   Your symptoms do not begin to resolve within 3 days.  SEEK IMMEDIATE MEDICAL CARE IF:    You have severe back pain or lower abdominal pain.   You develop chills.   You have nausea or vomiting.   You have continued burning or discomfort with urination.  MAKE SURE YOU:    Understand these instructions.   Will watch your condition.   Will get help right away if you are not doing well or get worse.  Document Released: 06/08/2005 Document Revised: 02/28/2012 Document Reviewed: 10/07/2011  Exit[employer] Patient Information 2014 [employer].

## 2013-04-17 NOTE — Progress Notes (Addendum)
  Subjective:    Patient ID: Phyllis Walker, female    DOB: Feb 25, 1946, 68 y.o.   MRN: [id number]  HPI patinet in C/O urinary urgency and frequency- Started about 1-2 weeks ago- having suprapubic pressure- Always feels like she has to pee but not much comes out. Nocturia started about 1 week ago.    Review of Systems  Genitourinary: Positive for dysuria, urgency, frequency and difficulty urinating. Negative for hematuria and flank pain.       Objective:   Physical Exam  Constitutional: She appears well-developed and well-nourished.  Cardiovascular: Normal rate and normal heart sounds.   Pulmonary/Chest: Effort normal and breath sounds normal.  Abdominal: Soft. Bowel sounds are normal. There is tenderness (mild suprpubic pain on palpation).  Genitourinary:  No CVA tenderness   BP 126/72  Pulse 107  Temp(Src) 97.9 F (36.6 C)  Wt 197 lb (89.359 kg)  BMI 32.78 kg/m2  Results for orders placed in visit on 04/17/13  POCT UA - MICROSCOPIC ONLY      Result Value Range   WBC, Ur, HPF, POC 10-15     RBC, urine, microscopic 1-5     Bacteria, U Microscopic mod     Mucus, UA neg     Epithelial cells, urine per micros occ     Crystals, Ur, HPF, POC neg     Casts, Ur, LPF, POC neg     Yeast, UA neg    POCT URINALYSIS DIPSTICK      Result Value Range   Color, UA yellow     Clarity, UA cloudy     Glucose, UA neg     Bilirubin, UA neg     Ketones, UA neg     Spec Grav, UA 1.010     Blood, UA trace     pH, UA 7.0     Protein, UA neg     Urobilinogen, UA negative     Nitrite, UA pos     Leukocytes, UA moderate (2+)          Assessment & Plan:  1. Urine frequency  - POCT UA - Microscopic Only - POCT urinalysis dipstick  2. UTI (urinary tract infection) Force fluids AZO OTC rn - ciprofloxacin (CIPRO) 500 MG tablet; Take 1 tablet (500 mg total) by mouth 2 (two) times daily.  Dispense: 14 tablet; Refill: 0  Mary-Margaret Daphine Deutscher, FNP

## 2013-04-17 NOTE — Telephone Encounter (Signed)
appt scheduled for 10:30 today. Patient aware.

## 2013-05-03 ENCOUNTER — Other Ambulatory Visit: Payer: Self-pay

## 2013-05-03 DIAGNOSIS — I10 Essential (primary) hypertension: Secondary | ICD-10-CM

## 2013-05-03 DIAGNOSIS — G2581 Restless legs syndrome: Secondary | ICD-10-CM

## 2013-05-03 DIAGNOSIS — E039 Hypothyroidism, unspecified: Secondary | ICD-10-CM

## 2013-05-03 MED ORDER — LEVOTHYROXINE SODIUM 75 MCG PO TABS: 75.0000 ug | ORAL_TABLET | Freq: Every day | ORAL | Status: DC

## 2013-05-03 MED ORDER — SIMVASTATIN 40 MG PO TABS: 40.0000 mg | ORAL_TABLET | Freq: Every day | ORAL | Status: DC

## 2013-05-03 MED ORDER — SERTRALINE HCL 50 MG PO TABS: 50.0000 mg | ORAL_TABLET | Freq: Every day | ORAL | Status: DC

## 2013-05-03 MED ORDER — ROPINIROLE HCL 1 MG PO TABS: 1.0000 mg | ORAL_TABLET | Freq: Every evening | ORAL | Status: DC

## 2013-05-03 MED ORDER — BENAZEPRIL-HYDROCHLOROTHIAZIDE 20-12.5 MG PO TABS: 1.0000 | ORAL_TABLET | Freq: Every day | ORAL | Status: DC

## 2013-05-03 NOTE — Telephone Encounter (Signed)
Last seen 04/17/13 MMM  Last filled 12/21/12   Print for mail order and have nurse call patient to pick up

## 2013-05-06 ENCOUNTER — Other Ambulatory Visit: Payer: Self-pay | Admitting: *Deleted

## 2013-05-06 DIAGNOSIS — G2581 Restless legs syndrome: Secondary | ICD-10-CM

## 2013-05-06 DIAGNOSIS — E039 Hypothyroidism, unspecified: Secondary | ICD-10-CM

## 2013-05-06 DIAGNOSIS — I10 Essential (primary) hypertension: Secondary | ICD-10-CM

## 2013-05-06 MED ORDER — SIMVASTATIN 40 MG PO TABS: 40.0000 mg | ORAL_TABLET | Freq: Every day | ORAL | Status: DC

## 2013-05-06 MED ORDER — ROPINIROLE HCL 1 MG PO TABS: 1.0000 mg | ORAL_TABLET | Freq: Every evening | ORAL | Status: DC

## 2013-05-06 MED ORDER — BENAZEPRIL-HYDROCHLOROTHIAZIDE 20-12.5 MG PO TABS: 1.0000 | ORAL_TABLET | Freq: Every day | ORAL | Status: DC

## 2013-05-06 MED ORDER — SERTRALINE HCL 50 MG PO TABS: 50.0000 mg | ORAL_TABLET | Freq: Every day | ORAL | Status: DC

## 2013-05-06 MED ORDER — LEVOTHYROXINE SODIUM 75 MCG PO TABS: 75.0000 ug | ORAL_TABLET | Freq: Every day | ORAL | Status: DC

## 2013-05-06 NOTE — Telephone Encounter (Signed)
These meds were filled last week and sent to [facility] pharmacy. Were supposed to be printed for mail order. Could you please print and route to Pool B so nurse can call patient to come pick them up

## 2013-05-09 ENCOUNTER — Other Ambulatory Visit (HOSPITAL_COMMUNITY)
Admission: RE | Admit: 2013-05-09 | Discharge: 2013-05-09 | Disposition: A | Discharge status: Home / Self Care | Payer: Medicare Other | Source: Ambulatory Visit | Attending: Gynecology | Admitting: Gynecology

## 2013-05-09 ENCOUNTER — Ambulatory Visit (INDEPENDENT_AMBULATORY_CARE_PROVIDER_SITE_OTHER): Payer: Medicare Other | Admitting: Women's Health

## 2013-05-09 ENCOUNTER — Encounter: Payer: Self-pay | Admitting: Women's Health

## 2013-05-09 VITALS — BP 118/76 | Ht 65.0 in | Wt 194.0 lb

## 2013-05-09 DIAGNOSIS — N9489 Other specified conditions associated with female genital organs and menstrual cycle: Secondary | ICD-10-CM

## 2013-05-09 DIAGNOSIS — Z124 Encounter for screening for malignant neoplasm of cervix: Secondary | ICD-10-CM

## 2013-05-09 DIAGNOSIS — R350 Frequency of micturition: Secondary | ICD-10-CM

## 2013-05-09 DIAGNOSIS — R102 Pelvic and perineal pain: Secondary | ICD-10-CM

## 2013-05-09 LAB — URINALYSIS W MICROSCOPIC + REFLEX CULTURE
Bilirubin Urine: NEGATIVE
Casts: NONE SEEN
Crystals: NONE SEEN
Glucose, UA: NEGATIVE mg/dL
Protein, ur: NEGATIVE mg/dL
pH: 7 (ref 5.0–8.0)

## 2013-05-09 NOTE — Addendum Note (Signed)
Addended by: Richardson Chiquito on: 05/09/2013 12:20 PM   Modules accepted: Orders

## 2013-05-09 NOTE — Patient Instructions (Signed)
Health Recommendations for Postmenopausal Women Respected and ongoing research has looked at the most common causes of death, disability, and poor quality of life in postmenopausal women. The causes include heart disease, diseases of blood vessels, diabetes, depression, cancer, and bone loss (osteoporosis). Many things can be done to help lower the chances of developing these and other common problems: CARDIOVASCULAR DISEASE Heart Disease: A heart attack is a medical emergency. Know the signs and symptoms of a heart attack. Below are things women can do to reduce their risk for heart disease.   Do not smoke. If you smoke, quit.  Aim for a healthy weight. Being overweight causes many preventable deaths. Eat a healthy and balanced diet and drink an adequate amount of liquids.  Get moving. Make a commitment to be more physically active. Aim for 30 minutes of activity on most, if not all days of the week.  Eat for heart health. Choose a diet that is low in saturated fat and cholesterol and eliminate trans fat. Include whole grains, vegetables, and fruits. Read and understand the labels on food containers before buying.  Know your numbers. Ask your caregiver to check your blood pressure, cholesterol (total, HDL, LDL, triglycerides) and blood glucose. Work with your caregiver on improving your entire clinical picture.  High blood pressure. Limit or stop your table salt intake (try salt substitute and food seasonings). Avoid salty foods and drinks. Read labels on food containers before buying. Eating well and exercising can help control high blood pressure. STROKE  Stroke is a medical emergency. Stroke may be the result of a blood clot in a blood vessel in the brain or by a brain hemorrhage (bleeding). Know the signs and symptoms of a stroke. To lower the risk of developing a stroke:  Avoid fatty foods.  Quit smoking.  Control your diabetes, blood pressure, and irregular heart rate. THROMBOPHLEBITIS  (BLOOD CLOT) OF THE LEG  Becoming overweight and leading a stationary lifestyle may also contribute to developing blood clots. Controlling your diet and exercising will help lower the risk of developing blood clots. CANCER SCREENING  Breast Cancer: Take steps to reduce your risk of breast cancer.  You should practice "breast self-awareness." This means understanding the normal appearance and feel of your breasts and should include breast self-examination. Any changes detected, no matter how small, should be reported to your caregiver.  After age 40, you should have a clinical breast exam (CBE) every year.  Starting at age 40, you should consider having a mammogram (breast X-ray) every year.  If you have a family history of breast cancer, talk to your caregiver about genetic screening.  If you are at high risk for breast cancer, talk to your caregiver about having an MRI and a mammogram every year.  Intestinal or Stomach Cancer: Tests to consider are a rectal exam, fecal occult blood, sigmoidoscopy, and colonoscopy. Women who are high risk may need to be screened at an earlier age and more often.  Cervical Cancer:  Beginning at age 30, you should have a Pap test every 3 years as long as the past 3 Pap tests have been normal.  If you have had past treatment for cervical cancer or a condition that could lead to cancer, you need Pap tests and screening for cancer for at least 20 years after your treatment.  If you had a hysterectomy for a problem that was not cancer or a condition that could lead to cancer, then you no longer need Pap tests.    If you are between ages 65 and 70, and you have had normal Pap tests going back 10 years, you no longer need Pap tests.  If Pap tests have been discontinued, risk factors (such as a new sexual partner) need to be reassessed to determine if screening should be resumed.  Some medical problems can increase the chance of getting cervical cancer. In these  cases, your caregiver may recommend more frequent screening and Pap tests.  Uterine Cancer: If you have vaginal bleeding after reaching menopause, you should notify your caregiver.  Ovarian cancer: Other than yearly pelvic exams, there are no reliable tests available to screen for ovarian cancer at this time except for yearly pelvic exams.  Lung Cancer: Yearly chest X-rays can detect lung cancer and should be done on high risk women, such as cigarette smokers and women with chronic lung disease (emphysema).  Skin Cancer: A complete body skin exam should be done at your yearly examination. Avoid overexposure to the sun and ultraviolet light lamps. Use a strong sun block cream when in the sun. All of these things are important in lowering the risk of skin cancer. MENOPAUSE Menopause Symptoms: Hormone therapy products are effective for treating symptoms associated with menopause:  Moderate to severe hot flashes.  Night sweats.  Mood swings.  Headaches.  Tiredness.  Loss of sex drive.  Insomnia.  Other symptoms. Hormone replacement carries certain risks, especially in older women. Women who use or are thinking about using estrogen or estrogen with progestin treatments should discuss that with their caregiver. Your caregiver will help you understand the benefits and risks. The ideal dose of hormone replacement therapy is not known. The Food and Drug Administration (FDA) has concluded that hormone therapy should be used only at the lowest doses and for the shortest amount of time to reach treatment goals.  OSTEOPOROSIS Protecting Against Bone Loss and Preventing Fracture: If you use hormone therapy for prevention of bone loss (osteoporosis), the risks for bone loss must outweigh the risk of the therapy. Ask your caregiver about other medications known to be safe and effective for preventing bone loss and fractures. To guard against bone loss or fractures, the following is recommended:  If  you are less than age 50, take 1000 mg of calcium and at least 600 mg of Vitamin D per day.  If you are greater than age 50 but less than age 70, take 1200 mg of calcium and at least 600 mg of Vitamin D per day.  If you are greater than age 70, take 1200 mg of calcium and at least 800 mg of Vitamin D per day. Smoking and excessive alcohol intake increases the risk of osteoporosis. Eat foods rich in calcium and vitamin D and do weight bearing exercises several times a week as your caregiver suggests. DIABETES Diabetes Melitus: If you have Type I or Type 2 diabetes, you should keep your blood sugar under control with diet, exercise and recommended medication. Avoid too many sweets, starchy and fatty foods. Being overweight can make control more difficult. COGNITION AND MEMORY Cognition and Memory: Menopausal hormone therapy is not recommended for the prevention of cognitive disorders such as Alzheimer's disease or memory loss.  DEPRESSION  Depression may occur at any age, but is common in elderly women. The reasons may be because of physical, medical, social (loneliness), or financial problems and needs. If you are experiencing depression because of medical problems and control of symptoms, talk to your caregiver about this. Physical activity and   exercise may help with mood and sleep. Community and volunteer involvement may help your sense of value and worth. If you have depression and you feel that the problem is getting worse or becoming severe, talk to your caregiver about treatment options that are best for you. ACCIDENTS  Accidents are common and can be serious in the elderly woman. Prepare your house to prevent accidents. Eliminate throw rugs, place hand bars in the bath, shower and toilet areas. Avoid wearing high heeled shoes or walking on wet, snowy, and icy areas. Limit or stop driving if you have vision or hearing problems, or you feel you are unsteady with you movements and  reflexes. HEPATITIS C Hepatitis C is a type of viral infection affecting the liver. It is spread mainly through contact with blood from an infected person. It can be treated, but if left untreated, it can lead to severe liver damage over years. Many people who are infected do not know that the virus is in their blood. If you are a "baby-boomer", it is recommended that you have one screening test for Hepatitis C. IMMUNIZATIONS  Several immunizations are important to consider having during your senior years, including:   Tetanus, diptheria, and pertussis booster shot.  Influenza every year before the flu season begins.  Pneumonia vaccine.  Shingles vaccine.  Others as indicated based on your specific needs. Talk to your caregiver about these. Document Released: 10/21/2005 Document Revised: 08/15/2012 Document Reviewed: 06/16/2008 [employer] Patient Information 2014 [employer].  

## 2013-05-09 NOTE — Progress Notes (Signed)
Phyllis Walker 1946/01/09 [id number]    History:    The patient presents for breast and pelvic exam. Postmenopausal/no bleeding/no HRT. History of normal Paps and mammograms, had a benign  left breast biopsy 1987. Normal DEXA at primary care. Normal colonoscopy 2010. Has had pneumonia vaccine, not zostavac. Hypertension/hypothyroid/hypercholesteremia/anxiety/depression-primary care manages. Partial thyroidectomy 1969. Bilateral bunionectomy this year  Past medical history, past surgical history, family history and social history were all reviewed and documented in the EPIC chart. Recently retired from [employer]. Has lost 10 pounds since retirement with diet and exercise. Mother pancreatic cancer, father MI,  sister hypertension, maternal aunt ovarian cancer living. Son anesthesiologist, daughter doing well.  Exam:  Filed Vitals:   05/09/13 1058  BP: 118/76    General appearance:  Normal Head/Neck:  Normal, without cervical or supraclavicular adenopathy. Thyroid:  Symmetrical, normal in size, without palpable masses or nodularity. Respiratory  Effort:  Normal  Auscultation:  Clear without wheezing or rhonchi Cardiovascular  Auscultation:  Regular rate, without rubs, murmurs or gallops  Edema/varicosities:  Not grossly evident Abdominal  Soft,nontender, without masses, guarding or rebound.  Liver/spleen:  No organomegaly noted  Hernia:  None appreciated  Skin  Inspection:  Grossly normal  Palpation:  Grossly normal Neurologic/psychiatric  Orientation:  Normal with appropriate conversation.  Mood/affect:  Normal  Genitourinary    Breasts: Examined lying and sitting.     Right: Without masses, retractions, discharge or axillary adenopathy.     Left: Without masses, retractions, discharge or axillary adenopathy.   Inguinal/mons:  Normal without inguinal adenopathy  External genitalia:  Normal  BUS/Urethra/Skene's glands:  Normal  Bladder:  Normal  Vagina:  Normal  Cervix:   Normal  Uterus:   normal in size, shape and contour.  Midline and mobile  Adnexa/parametria:     Rt: Without masses or tenderness.   Lt: Without masses or tenderness.  Anus and perineum: Normal  Digital rectal exam: Normal sphincter tone without palpated masses or tenderness  Assessment/Plan:  66 y.o. DWF G2P2 for breast and pelvic exam and complaint of urinary frequency with urgency.  Postmenopausal/no bleeding/no HRT Hypertension/hypothyroid/ hypercholesteremia/anxiety/depression-primary care manages labs and meds Obesity  Plan: SBE's, continue annual mammogram and send report to our office, calcium rich diet, vitamin D 2000 daily encouraged. Home safety, fall prevention and importance of regular exercise discussed. Pap, Pap normal 2012, new screening guidelines reviewed. Zostavac vaccine discussed and encouraged. UA rare bacteria trace leukocytes. Urine culture pending.    Harrington Challenger [facility], 11:34 AM 05/09/2013

## 2013-05-12 LAB — URINE CULTURE

## 2013-05-23 DIAGNOSIS — M1711 Unilateral primary osteoarthritis, right knee: Secondary | ICD-10-CM | POA: Insufficient documentation

## 2013-05-24 ENCOUNTER — Encounter: Payer: Self-pay | Admitting: Nurse Practitioner

## 2013-05-24 ENCOUNTER — Ambulatory Visit (INDEPENDENT_AMBULATORY_CARE_PROVIDER_SITE_OTHER): Payer: Medicare Other | Admitting: Nurse Practitioner

## 2013-05-24 ENCOUNTER — Ambulatory Visit: Payer: Medicare Other | Admitting: Nurse Practitioner

## 2013-05-24 VITALS — BP 123/76 | HR 92 | Temp 98.4°F | Ht 65.0 in | Wt 195.0 lb

## 2013-05-24 DIAGNOSIS — Z2911 Encounter for prophylactic immunotherapy for respiratory syncytial virus (RSV): Secondary | ICD-10-CM

## 2013-05-24 DIAGNOSIS — I10 Essential (primary) hypertension: Secondary | ICD-10-CM

## 2013-05-24 DIAGNOSIS — F329 Major depressive disorder, single episode, unspecified: Secondary | ICD-10-CM

## 2013-05-24 DIAGNOSIS — E785 Hyperlipidemia, unspecified: Secondary | ICD-10-CM

## 2013-05-24 DIAGNOSIS — G2581 Restless legs syndrome: Secondary | ICD-10-CM

## 2013-05-24 DIAGNOSIS — G629 Polyneuropathy, unspecified: Secondary | ICD-10-CM

## 2013-05-24 DIAGNOSIS — E039 Hypothyroidism, unspecified: Secondary | ICD-10-CM

## 2013-05-24 DIAGNOSIS — F32A Depression, unspecified: Secondary | ICD-10-CM

## 2013-05-24 DIAGNOSIS — G589 Mononeuropathy, unspecified: Secondary | ICD-10-CM

## 2013-05-24 DIAGNOSIS — G4700 Insomnia, unspecified: Secondary | ICD-10-CM

## 2013-05-24 MED ORDER — GABAPENTIN 100 MG PO CAPS: 100.0000 mg | ORAL_CAPSULE | Freq: Three times a day (TID) | ORAL | Status: DC

## 2013-05-24 MED ORDER — ROPINIROLE HCL 1 MG PO TABS: 1.0000 mg | ORAL_TABLET | Freq: Every evening | ORAL | Status: DC

## 2013-05-24 MED ORDER — ZOLPIDEM TARTRATE 10 MG PO TABS: 10.0000 mg | ORAL_TABLET | Freq: Every evening | ORAL | Status: DC | PRN

## 2013-05-24 MED ORDER — SERTRALINE HCL 50 MG PO TABS: 50.0000 mg | ORAL_TABLET | Freq: Every day | ORAL | Status: DC

## 2013-05-24 MED ORDER — BENAZEPRIL-HYDROCHLOROTHIAZIDE 20-12.5 MG PO TABS: 1.0000 | ORAL_TABLET | Freq: Every day | ORAL | Status: DC

## 2013-05-24 MED ORDER — LEVOTHYROXINE SODIUM 75 MCG PO TABS: 75.0000 ug | ORAL_TABLET | Freq: Every day | ORAL | Status: DC

## 2013-05-24 MED ORDER — ALPRAZOLAM 0.25 MG PO TABS: 0.2500 mg | ORAL_TABLET | Freq: Two times a day (BID) | ORAL | Status: DC

## 2013-05-24 MED ORDER — SIMVASTATIN 40 MG PO TABS: 40.0000 mg | ORAL_TABLET | Freq: Every day | ORAL | Status: DC

## 2013-05-24 NOTE — Progress Notes (Signed)
Subjective:    Patient ID: Phyllis Walker, female    DOB: 1946/02/03, 67 y.o.   MRN: [id number]  Hyperlipidemia This is a chronic problem. The current episode started more than 1 year ago. The problem is controlled. Recent lipid tests were reviewed and are normal. Exacerbating diseases include hypothyroidism. Pertinent negatives include no chest pain, leg pain, myalgias or shortness of breath. Current antihyperlipidemic treatment includes statins. There are no compliance problems.  Risk factors for coronary artery disease include hypertension.  Anxiety Presents for follow-up visit. Symptoms include insomnia. Patient reports no chest pain, depressed mood, excessive worry, nervous/anxious behavior, palpitations or shortness of breath. Symptoms occur occasionally. The severity of symptoms is mild. The quality of sleep is fair. Nighttime awakenings: occasional.    Hypertension This is a chronic problem. The current episode started more than 1 year ago. The problem has been resolved since onset. The problem is controlled. Associated symptoms include anxiety. Pertinent negatives include no chest pain, palpitations, peripheral edema or shortness of breath. Risk factors for coronary artery disease include dyslipidemia. Past treatments include ACE inhibitors and diuretics. The current treatment provides significant improvement. There are no compliance problems.  Hypertensive end-organ damage includes a thyroid problem.  Thyroid Problem Presents for follow-up (hypothyroidism) visit. Patient reports no depressed mood, hair loss, heat intolerance, hoarse voice, leg swelling, menstrual problem, palpitations, weight gain or weight loss. Her past medical history is significant for hyperlipidemia.  Depression/Anxiety This is a chronic problem. Pt denies depressed mood, anxiety, or sleep disturbances. Has recently retired and feels less stressed. RLS requip helps a lot- couldn't sleep at night without  taking Insomnia Remus Loffler works well- feels rested in mornings Neuropathy Gabapentin helps with th eburning senesation in bil feet- no c/o side effects   Review of Systems  Constitutional: Negative for weight loss and weight gain.  HENT: Negative for hoarse voice.   Respiratory: Negative for shortness of breath.   Cardiovascular: Negative for chest pain and palpitations.  Endocrine: Negative for heat intolerance.  Genitourinary: Negative for menstrual problem.  Musculoskeletal: Negative for myalgias.  Psychiatric/Behavioral: The patient has insomnia. The patient is not nervous/anxious.   All other systems reviewed and are negative.       Objective:   Physical Exam  Constitutional: She is oriented to person, place, and time. She appears well-developed and well-nourished.  HENT:  Nose: Nose normal.  Mouth/Throat: Oropharynx is clear and moist.  Eyes: EOM are normal.  Neck: Trachea normal, normal range of motion and full passive range of motion without pain. Neck supple. No JVD present. Carotid bruit is not present. No thyromegaly present.  Cardiovascular: Normal rate, regular rhythm, normal heart sounds and intact distal pulses.  Exam reveals no gallop and no friction rub.   No murmur heard. Pulmonary/Chest: Effort normal and breath sounds normal.  Abdominal: Soft. Bowel sounds are normal. She exhibits no distension and no mass. There is no tenderness.  Musculoskeletal: Normal range of motion.  Lymphadenopathy:    She has no cervical adenopathy.  Neurological: She is alert and oriented to person, place, and time. She has normal reflexes.  Skin: Skin is warm and dry.  Psychiatric: She has a normal mood and affect. Her behavior is normal. Judgment and thought content normal.    BP 123/76  Pulse 92  Temp(Src) 98.4 F (36.9 C) (Oral)  Ht 5\' 5"  (1.651 m)  Wt 195 lb (88.451 kg)  BMI 32.45 kg/m2       Assessment & Plan:  1. Other  and unspecified hyperlipidemia Low fat  diet Exercise - simvastatin (ZOCOR) 40 MG tablet; Take 1 tablet (40 mg total) by mouth every evening.  Dispense: 90 tablet; Refill: 1 - NMR Lipoprofile with Lipids  2. Unspecified hypothyroidism - levothyroxine (SYNTHROID, LEVOTHROID) 75 MCG tablet; Take 1 tablet (75 mcg total) by mouth daily.  Dispense: 90 tablet; Refill: 1 - Thyroid Panel With TSH  3. GAD (generalized anxiety disorder) Stress Managment  4. Hypertension, benign essential, goal below 140/90 Low Na+ diet Exercise - benazepril-hydrochlorthiazide (LOTENSIN HCT) 20-12.5 MG per tablet; Take 1 tablet by mouth daily.  Dispense: 90 tablet; Refill: 1 - ALPRAZolam (XANAX) 0.25 MG tablet; Take 1 tablet (0.25 mg total) by mouth 2 (two) times daily.  Dispense: 180 tablet; Refill: 0 - COMPLETE METABOLIC PANEL WITH GFR  5. Depression Stress Management  6. Insomnia Bedtime Ritual - zolpidem (AMBIEN) 10 MG tablet; Take 1 tablet (10 mg total) by mouth at bedtime as needed. For sleep  Dispense: 30 tablet; Refill: 1  7. Restless leg syndrome - rOPINIRole (REQUIP) 1 MG tablet; Take 1 tablet (1 mg total) by mouth every evening.  Dispense: 90 tablet; Refill: 1  8. Neuropathy -gabapentin 100mg  1 po TID #270 1 refill  Mary-Margaret Daphine Deutscher, FNP

## 2013-05-24 NOTE — Patient Instructions (Signed)

## 2013-05-24 NOTE — Addendum Note (Signed)
Addended by: Bennie Pierini on: 05/24/2013 02:50 PM   Modules accepted: Orders

## 2013-05-24 NOTE — Addendum Note (Signed)
Addended by: Bernita Buffy on: 05/24/2013 03:34 PM   Modules accepted: Orders

## 2013-05-24 NOTE — Addendum Note (Signed)
Addended by: Bennie Pierini on: 05/24/2013 02:47 PM   Modules accepted: Orders

## 2013-05-26 LAB — NMR, LIPOPROFILE
Cholesterol: 189 mg/dL (ref <200)
LDL Particle Number: 1386 nmol/L — ABNORMAL HIGH (ref <1000)
LDLC SERPL CALC-MCNC: 115 mg/dL — ABNORMAL HIGH (ref <100)
LP-IR Score: <25 (ref <=45)
Triglycerides by NMR: 77 mg/dL (ref <150)

## 2013-05-26 LAB — THYROID PANEL WITH TSH
Free Thyroxine Index: 2.8 (ref 1.2–4.9)
T3 Uptake Ratio: 24 % (ref 24–39)
T4, Total: 11.5 ug/dL (ref 4.5–12.0)

## 2013-05-26 LAB — CMP14+EGFR
ALT: 10 IU/L (ref 0–32)
Albumin: 4.7 g/dL (ref 3.6–4.8)
Alkaline Phosphatase: 111 IU/L (ref 39–117)
BUN: 26 mg/dL (ref 8–27)
CO2: 25 mmol/L (ref 18–29)
Chloride: 98 mmol/L (ref 97–108)
Glucose: 116 mg/dL — ABNORMAL HIGH (ref 65–99)
Total Protein: 7.4 g/dL (ref 6.0–8.5)

## 2013-05-29 ENCOUNTER — Telehealth: Payer: Self-pay | Admitting: Nurse Practitioner

## 2013-05-29 ENCOUNTER — Other Ambulatory Visit: Payer: Self-pay | Admitting: Nurse Practitioner

## 2013-05-29 DIAGNOSIS — E039 Hypothyroidism, unspecified: Secondary | ICD-10-CM

## 2013-05-29 MED ORDER — LEVOTHYROXINE SODIUM 75 MCG PO TABS: 75.0000 ug | ORAL_TABLET | Freq: Every day | ORAL | Status: DC

## 2013-05-29 NOTE — Telephone Encounter (Signed)
rx printed instead of going electronically- redid an dent to pharmacy

## 2013-07-16 ENCOUNTER — Other Ambulatory Visit (INDEPENDENT_AMBULATORY_CARE_PROVIDER_SITE_OTHER): Payer: Medicare Other

## 2013-07-16 DIAGNOSIS — E039 Hypothyroidism, unspecified: Secondary | ICD-10-CM

## 2013-07-16 NOTE — Progress Notes (Signed)
Pt came in for labs only 

## 2013-07-16 NOTE — Progress Notes (Signed)
Labs only

## 2013-07-17 LAB — THYROID PANEL WITH TSH: T3 Uptake Ratio: 25 % (ref 24–39)

## 2013-08-05 ENCOUNTER — Other Ambulatory Visit: Payer: Self-pay | Admitting: Nurse Practitioner

## 2013-08-06 ENCOUNTER — Other Ambulatory Visit: Payer: Self-pay

## 2013-08-06 DIAGNOSIS — I10 Essential (primary) hypertension: Secondary | ICD-10-CM

## 2013-08-06 MED ORDER — ALPRAZOLAM 0.25 MG PO TABS: 0.2500 mg | ORAL_TABLET | Freq: Two times a day (BID) | ORAL | Status: DC

## 2013-08-06 NOTE — Telephone Encounter (Signed)
Last seen 05/24/13  MMM If approved print for mail order and route to nurse 

## 2013-08-06 NOTE — Telephone Encounter (Signed)
Please refill xanax

## 2013-08-13 ENCOUNTER — Telehealth: Payer: Self-pay | Admitting: Nurse Practitioner

## 2013-08-13 DIAGNOSIS — I10 Essential (primary) hypertension: Secondary | ICD-10-CM

## 2013-08-13 NOTE — Telephone Encounter (Signed)
Said that this medication was printed but it is not up front it was suppose to be fore mail order

## 2013-08-14 ENCOUNTER — Ambulatory Visit (INDEPENDENT_AMBULATORY_CARE_PROVIDER_SITE_OTHER): Payer: Medicare Other | Admitting: Family Medicine

## 2013-08-14 ENCOUNTER — Encounter: Payer: Self-pay | Admitting: Family Medicine

## 2013-08-14 VITALS — BP 115/78 | HR 90 | Temp 97.8°F | Ht 65.0 in | Wt 186.0 lb

## 2013-08-14 DIAGNOSIS — J029 Acute pharyngitis, unspecified: Secondary | ICD-10-CM

## 2013-08-14 DIAGNOSIS — I10 Essential (primary) hypertension: Secondary | ICD-10-CM

## 2013-08-14 MED ORDER — ALPRAZOLAM 0.25 MG PO TABS: 0.2500 mg | ORAL_TABLET | Freq: Two times a day (BID) | ORAL | Status: DC

## 2013-08-14 MED ORDER — AZITHROMYCIN 250 MG PO TABS: ORAL_TABLET | ORAL | Status: DC

## 2013-08-14 NOTE — Progress Notes (Signed)
   Subjective:    Patient ID: Phyllis Walker, female    DOB: [birth year], 67 y.o.   MRN: [id number]  Sore Throat  This is a new problem. The current episode started yesterday. The problem has been gradually worsening. The pain is worse on the right side. There has been no fever. The pain is at a severity of 3/10. The pain is mild. Associated symptoms include coughing, ear pain and a plugged ear sensation. Pertinent negatives include no congestion, ear discharge, headaches, shortness of breath or trouble swallowing. She has tried NSAIDs for the symptoms. The treatment provided mild relief.      Review of Systems  HENT: Positive for ear pain. Negative for congestion, ear discharge and trouble swallowing.   Respiratory: Positive for cough. Negative for shortness of breath.   Neurological: Negative for headaches.  All other systems reviewed and are negative.       Objective:   Physical Exam  Vitals reviewed. Constitutional: She is oriented to person, place, and time. She appears well-developed and well-nourished.  HENT:  Head: Normocephalic.  Right Ear: External ear normal.  Left Ear: External ear normal.  Nose: Nose normal.  Mouth/Throat: Posterior oropharyngeal erythema present.  Cardiovascular: Normal rate, regular rhythm, normal heart sounds and intact distal pulses.   No murmur heard. Pulmonary/Chest: Effort normal and breath sounds normal. No respiratory distress.  Musculoskeletal: Normal range of motion.  Neurological: She is alert and oriented to person, place, and time.  Skin: Skin is warm and dry.  Psychiatric: She has a normal mood and affect. Her behavior is normal. Judgment and thought content normal.    BP 115/78  Pulse 90  Temp(Src) 97.8 F (36.6 C) (Oral)  Ht 5\' 5"  (1.651 m)  Wt 186 lb (84.369 kg)  BMI 30.95 kg/m2       Assessment & Plan:  Hypertension, benign essential, goal below 140/90 - Plan: ALPRAZolam (XANAX) 0.25 MG tablet  Acute pharyngitis - Zpak  as directed.  Push po fluids, rest, tylenol and motrin otc prn as directed for fever, arthralgias, and myalgias.  Follow up prn if sx's continue or persist.  Deatra Canter FNP

## 2013-08-14 NOTE — Telephone Encounter (Signed)
K. Rutherford called rx into local pharmacy

## 2013-08-14 NOTE — Telephone Encounter (Signed)
Patient wants this rx for mail order

## 2013-08-14 NOTE — Patient Instructions (Signed)
Sore Throat A sore throat is pain, burning, irritation, or scratchiness of the throat. There is often pain or tenderness when swallowing or talking. A sore throat may be accompanied by other symptoms, such as coughing, sneezing, fever, and swollen neck glands. A sore throat is often the first sign of another sickness, such as a cold, flu, strep throat, or mononucleosis (commonly known as mono). Most sore throats go away without medical treatment. CAUSES  The most common causes of a sore throat include:  A viral infection, such as a cold, flu, or mono.  A bacterial infection, such as strep throat, tonsillitis, or whooping cough.  Seasonal allergies.  Dryness in the air.  Irritants, such as smoke or pollution.  Gastroesophageal reflux disease (GERD). HOME CARE INSTRUCTIONS   Only take over-the-counter medicines as directed by your caregiver.  Drink enough fluids to keep your urine clear or pale yellow.  Rest as needed.  Try using throat sprays, lozenges, or sucking on hard candy to ease any pain (if older than 4 years or as directed).  Sip warm liquids, such as broth, herbal tea, or warm water with honey to relieve pain temporarily. You may also eat or drink cold or frozen liquids such as frozen ice pops.  Gargle with salt water (mix 1 tsp salt with 8 oz of water).  Do not smoke and avoid secondhand smoke.  Put a cool-mist humidifier in your bedroom at night to moisten the air. You can also turn on a hot shower and sit in the bathroom with the door closed for 5 10 minutes. SEEK IMMEDIATE MEDICAL CARE IF:  You have difficulty breathing.  You are unable to swallow fluids, soft foods, or your saliva.  You have increased swelling in the throat.  Your sore throat does not get better in 7 days.  You have nausea and vomiting.  You have a fever or persistent symptoms for more than 2 3 days.  You have a fever and your symptoms suddenly get worse. MAKE SURE YOU:   Understand  these instructions.  Will watch your condition.  Will get help right away if you are not doing well or get worse. Document Released: 10/06/2004 Document Revised: 08/15/2012 Document Reviewed: 05/06/2012 [employer] Patient Information 2014 [employer].  

## 2013-08-14 NOTE — Telephone Encounter (Signed)
Please call in alprazolam 0.25 1 po BID 180 1 refill

## 2013-09-24 ENCOUNTER — Telehealth: Payer: Self-pay | Admitting: Nurse Practitioner

## 2013-09-24 ENCOUNTER — Ambulatory Visit (INDEPENDENT_AMBULATORY_CARE_PROVIDER_SITE_OTHER): Payer: Medicare HMO | Admitting: Family Medicine

## 2013-09-24 ENCOUNTER — Encounter: Payer: Self-pay | Admitting: Family Medicine

## 2013-09-24 VITALS — BP 105/62 | HR 76 | Temp 97.9°F | Ht 65.0 in | Wt 180.0 lb

## 2013-09-24 DIAGNOSIS — J40 Bronchitis, not specified as acute or chronic: Secondary | ICD-10-CM

## 2013-09-24 DIAGNOSIS — G4700 Insomnia, unspecified: Secondary | ICD-10-CM

## 2013-09-24 MED ORDER — PREDNISONE 50 MG PO TABS: ORAL_TABLET | ORAL | Status: DC

## 2013-09-24 MED ORDER — LEVOFLOXACIN 500 MG PO TABS: ORAL_TABLET | ORAL | Status: DC

## 2013-09-24 NOTE — Progress Notes (Signed)
   Subjective:    Patient ID: Phyllis Walker, female    DOB: 1946/01/29, 68 y.o.   MRN: [id number]  HPI URI Symptoms Onset: 4-5 weeks  Description: rhinorrea, post nasal drip, cough, wheezing  Modifying factors:  Former smoker ( 30+ pack years).   Symptoms Nasal discharge: post nasal drip ,mild Fever: no Sore throat: no Cough: yes Wheezing: yes Ear pain: no GI symptoms: no Sick contacts: no  Red Flags  Stiff neck: no Dyspnea: mild Rash: no Swallowing difficulty: no  Sinusitis Risk Factors Headache/face pain: no Double sickening: no tooth pain: no  Allergy Risk Factors Sneezing: no Itchy scratchy throat: no Seasonal symptoms: no  Flu Risk Factors Headache: no muscle aches: no severe fatigue: no  Patient Active Problem List   Diagnosis Date Noted  . Other and unspecified hyperlipidemia 12/21/2012  . Unspecified hypothyroidism 12/21/2012  . GAD (generalized anxiety disorder) 12/21/2012  . Hypertension, benign essential, goal below 140/90 12/21/2012  . Depression 12/21/2012      Review of Systems  All other systems reviewed and are negative.       Objective:   Physical Exam  Constitutional: She is oriented to person, place, and time. She appears well-developed and well-nourished.  HENT:  Head: Normocephalic and atraumatic.  Right Ear: External ear normal.  Left Ear: External ear normal.  +nasal erythema, rhinorrhea bilaterally, + post oropharyngeal erythema    Eyes: Conjunctivae are normal. Pupils are equal, round, and reactive to light.  Neck: Normal range of motion. Neck supple.  Cardiovascular: Normal rate and regular rhythm.   Pulmonary/Chest: Effort normal. She has wheezes.  Abdominal: Soft.  Neurological: She is alert and oriented to person, place, and time.  Skin: Skin is warm.   Filed Vitals:   09/24/13 1210  BP: 105/62  Pulse: 76  Temp: 97.9 F (36.6 C)          Assessment & Plan:  Bronchitis - Plan: predniSONE (DELTASONE) 50  MG tablet, levofloxacin (LEVAQUIN) 500 MG tablet  Suspect that there may be an element of untreated/undiagnosed obstructive lung disease in overall clinical picture.  Will place on course of prednisone.  Will add on course of levaquin if sxs still persist w/in 24-48 hours of prednisone.  No resp distress or hypoxia today.  Discussed infectious and resp red flags at length Will need oupt PFTs 6-8 weeks after resolution of sxs.  May consider pulm referral if sxs persist despite treatment.  Follow up as needed.

## 2013-09-25 MED ORDER — ZOLPIDEM TARTRATE 10 MG PO TABS: 10.0000 mg | ORAL_TABLET | Freq: Every evening | ORAL | Status: DC | PRN

## 2013-09-25 NOTE — Telephone Encounter (Signed)
Called into [facility] 

## 2013-09-25 NOTE — Telephone Encounter (Signed)
Please call in ambien 10 mg 1 po qhs #30 2 refills

## 2013-10-25 ENCOUNTER — Ambulatory Visit: Payer: Medicare HMO | Admitting: Nurse Practitioner

## 2013-11-08 ENCOUNTER — Ambulatory Visit: Payer: Medicare HMO | Admitting: Nurse Practitioner

## 2013-11-08 ENCOUNTER — Ambulatory Visit (INDEPENDENT_AMBULATORY_CARE_PROVIDER_SITE_OTHER): Payer: Medicare HMO | Admitting: Nurse Practitioner

## 2013-11-08 ENCOUNTER — Encounter: Payer: Self-pay | Admitting: Nurse Practitioner

## 2013-11-08 VITALS — BP 123/75 | HR 90 | Temp 97.0°F | Ht 65.0 in | Wt 177.0 lb

## 2013-11-08 DIAGNOSIS — E785 Hyperlipidemia, unspecified: Secondary | ICD-10-CM

## 2013-11-08 DIAGNOSIS — F411 Generalized anxiety disorder: Secondary | ICD-10-CM

## 2013-11-08 DIAGNOSIS — E039 Hypothyroidism, unspecified: Secondary | ICD-10-CM

## 2013-11-08 DIAGNOSIS — G629 Polyneuropathy, unspecified: Secondary | ICD-10-CM

## 2013-11-08 DIAGNOSIS — G589 Mononeuropathy, unspecified: Secondary | ICD-10-CM

## 2013-11-08 DIAGNOSIS — F329 Major depressive disorder, single episode, unspecified: Secondary | ICD-10-CM

## 2013-11-08 DIAGNOSIS — G4700 Insomnia, unspecified: Secondary | ICD-10-CM

## 2013-11-08 DIAGNOSIS — I10 Essential (primary) hypertension: Secondary | ICD-10-CM

## 2013-11-08 DIAGNOSIS — F32A Depression, unspecified: Secondary | ICD-10-CM

## 2013-11-08 DIAGNOSIS — F3289 Other specified depressive episodes: Secondary | ICD-10-CM

## 2013-11-08 MED ORDER — ROPINIROLE HCL 0.5 MG PO TABS
0.5000 mg | ORAL_TABLET | Freq: Every day | ORAL | Status: DC
Start: 2013-11-08 — End: 2014-04-26

## 2013-11-08 MED ORDER — TRAMADOL HCL 50 MG PO TABS: 50.0000 mg | ORAL_TABLET | Freq: Two times a day (BID) | ORAL | Status: DC

## 2013-11-08 MED ORDER — GABAPENTIN 100 MG PO CAPS: 100.0000 mg | ORAL_CAPSULE | Freq: Three times a day (TID) | ORAL | Status: DC

## 2013-11-08 MED ORDER — BENAZEPRIL-HYDROCHLOROTHIAZIDE 20-12.5 MG PO TABS: ORAL_TABLET | ORAL | Status: DC

## 2013-11-08 MED ORDER — ALPRAZOLAM 0.25 MG PO TABS: 0.2500 mg | ORAL_TABLET | Freq: Two times a day (BID) | ORAL | Status: DC

## 2013-11-08 MED ORDER — ZOLPIDEM TARTRATE 10 MG PO TABS: 10.0000 mg | ORAL_TABLET | Freq: Every evening | ORAL | Status: DC | PRN

## 2013-11-08 NOTE — Patient Instructions (Signed)

## 2013-11-08 NOTE — Progress Notes (Signed)
Subjective:    Patient ID: Phyllis Walker, female    DOB: 1945/09/22, 68 y.o.   MRN: [id number]  Hypertension This is a chronic problem. The current episode started more than 1 year ago. The problem is unchanged. The problem is controlled. Pertinent negatives include no blurred vision, chest pain, headaches, neck pain, palpitations, peripheral edema or shortness of breath. There are no associated agents to hypertension. Risk factors for coronary artery disease include dyslipidemia and family history. Past treatments include ACE inhibitors and diuretics. The current treatment provides moderate improvement. Compliance problems include diet and exercise.  Hypertensive end-organ damage includes a thyroid problem.  Thyroid Problem Presents for follow-up (hypothyroidism- was told with last labs to decrease levothroxin to 74mg but rx never got called in so she has remained on 784m daily.) visit. Patient reports no anxiety, diarrhea, dry skin, hoarse voice, menstrual problem, palpitations, visual change, weight gain or weight loss. Her past medical history is significant for hyperlipidemia. There is no history of diabetes.  Hyperlipidemia This is a chronic problem. The current episode started more than 1 year ago. The problem is controlled. Recent lipid tests were reviewed and are normal. Exacerbating diseases include hypothyroidism. She has no history of diabetes or obesity. Pertinent negatives include no chest pain or shortness of breath. Current antihyperlipidemic treatment includes statins. The current treatment provides mild improvement of lipids. Compliance problems include adherence to diet.  Risk factors for coronary artery disease include dyslipidemia, family history and post-menopausal.  Foot neuropathy Neurotin is helping- patient says she goes dancing a lot and her feet are doing ok- occasional burning Depression/Gad Zoloft and xanax- combination working well.  Review of Systems    Constitutional: Negative for weight loss and weight gain.  HENT: Negative for hoarse voice.   Eyes: Negative for blurred vision.  Respiratory: Negative for shortness of breath.   Cardiovascular: Negative for chest pain and palpitations.  Gastrointestinal: Negative for diarrhea.  Genitourinary: Negative for menstrual problem.  Musculoskeletal: Negative for neck pain.  Neurological: Negative for headaches.  All other systems reviewed and are negative.       Objective:   Physical Exam  Constitutional: She is oriented to person, place, and time. She appears well-developed and well-nourished.  HENT:  Nose: Nose normal.  Mouth/Throat: Oropharynx is clear and moist.  Eyes: EOM are normal.  Neck: Trachea normal, normal range of motion and full passive range of motion without pain. Neck supple. No JVD present. Carotid bruit is not present. No thyromegaly present.  Cardiovascular: Normal rate, regular rhythm, normal heart sounds and intact distal pulses.  Exam reveals no gallop and no friction rub.   No murmur heard. Pulmonary/Chest: Effort normal and breath sounds normal.  Abdominal: Soft. Bowel sounds are normal. She exhibits no distension and no mass. There is no tenderness.  Musculoskeletal: Normal range of motion.  Lymphadenopathy:    She has no cervical adenopathy.  Neurological: She is alert and oriented to person, place, and time. She has normal reflexes.  Skin: Skin is warm and dry.  Psychiatric: She has a normal mood and affect. Her behavior is normal. Judgment and thought content normal.   BP 123/75  Pulse 90  Temp(Src) 97 F (36.1 C) (Oral)  Ht '5\' 5"'  (1.651 m)  Wt 177 lb (80.287 kg)  BMI 29.45 kg/m2        Assessment & Plan:   1. Unspecified hypothyroidism   2. Other and unspecified hyperlipidemia   3. Hypertension, benign essential, goal below 140/90  4. GAD (generalized anxiety disorder)   5. Depression   6. Neuropathy   7. Insomnia    Orders Placed This  Encounter  Procedures  . CMP14+EGFR  . NMR, lipoprofile  . Thyroid Panel With TSH   Meds ordered this encounter  Medications  . DISCONTD: traMADol (ULTRAM) 50 MG tablet    Sig: Take 50 mg by mouth 2 (two) times daily.  [name] rOPINIRole (REQUIP) 0.5 MG tablet    Sig: Take 1 tablet (0.5 mg total) by mouth at bedtime.    Dispense:  30 tablet    Refill:  5    Order Specific Question:  Supervising Provider    Answer:  Chipper Herb [[id number]]  . benazepril-hydrochlorthiazide (LOTENSIN HCT) 20-12.5 MG per tablet    Sig: Take 1 tablet by mouth  daily    Dispense:  30 tablet    Refill:  5    Order Specific Question:  Supervising Provider    Answer:  Chipper Herb [[id number]]  . gabapentin (NEURONTIN) 100 MG capsule    Sig: Take 1 capsule (100 mg total) by mouth 3 (three) times daily.    Dispense:  90 capsule    Refill:  5    Order Specific Question:  Supervising Provider    Answer:  Chipper Herb [[id number]]  . traMADol (ULTRAM) 50 MG tablet    Sig: Take 1 tablet (50 mg total) by mouth 2 (two) times daily.    Dispense:  60 tablet    Refill:  1    Order Specific Question:  Supervising Provider    Answer:  Chipper Herb [[id number]]  . zolpidem (AMBIEN) 10 MG tablet    Sig: Take 1 tablet (10 mg total) by mouth at bedtime as needed. For sleep    Dispense:  30 tablet    Refill:  2    Order Specific Question:  Supervising Provider    Answer:  Chipper Herb [[id number]]  . ALPRAZolam (XANAX) 0.25 MG tablet    Sig: Take 1 tablet (0.25 mg total) by mouth 2 (two) times daily.    Dispense:  60 tablet    Refill:  1    Order Specific Question:  Supervising Provider    Answer:  Chipper Herb [[id number]]    Labs pending Health maintenance reviewed Diet and exercise encouraged Continue all meds Follow up  In 3 months   [place], FNP

## 2013-11-09 LAB — CMP14+EGFR
A/G RATIO: 1.7 (ref 1.1–2.5)
ALBUMIN: 4.1 g/dL (ref 3.6–4.8)
ALT: 11 IU/L (ref 0–32)
AST: 18 IU/L (ref 0–40)
Alkaline Phosphatase: 103 IU/L (ref 39–117)
BUN/Creatinine Ratio: 18 (ref 11–26)
BUN: 14 mg/dL (ref 8–27)
CO2: 27 mmol/L (ref 18–29)
Calcium: 9.1 mg/dL (ref 8.7–10.3)
Chloride: 100 mmol/L (ref 97–108)
Creatinine, Ser: 0.76 mg/dL (ref 0.57–1.00)
GFR calc Af Amer: 93 mL/min/{1.73_m2} (ref >59)
GFR, EST NON AFRICAN AMERICAN: 81 mL/min/{1.73_m2} (ref >59)
Globulin, Total: 2.4 g/dL (ref 1.5–4.5)
Glucose: 90 mg/dL (ref 65–99)
Potassium: 3.9 mmol/L (ref 3.5–5.2)
Sodium: 141 mmol/L (ref 134–144)
TOTAL PROTEIN: 6.5 g/dL (ref 6.0–8.5)
Total Bilirubin: 0.2 mg/dL (ref 0.0–1.2)

## 2013-11-09 LAB — NMR, LIPOPROFILE
Cholesterol: 163 mg/dL (ref <200)
HDL Cholesterol by NMR: 52 mg/dL (ref >=40)
HDL PARTICLE NUMBER: 36.1 umol/L (ref >=30.5)
LDL Particle Number: 1173 nmol/L — ABNORMAL HIGH (ref <1000)
LDL Size: 20.7 nm (ref >20.5)
LDLC SERPL CALC-MCNC: 88 mg/dL (ref <100)
LP-IR Score: <25 (ref <=45)
SMALL LDL PARTICLE NUMBER: 467 nmol/L (ref <=527)
TRIGLYCERIDES BY NMR: 117 mg/dL (ref <150)

## 2013-11-09 LAB — THYROID PANEL WITH TSH
Free Thyroxine Index: 2.7 (ref 1.2–4.9)
T3 Uptake Ratio: 26 % (ref 24–39)
T4 TOTAL: 10.4 ug/dL (ref 4.5–12.0)
TSH: 1.03 u[IU]/mL (ref 0.450–4.500)

## 2014-01-03 ENCOUNTER — Other Ambulatory Visit: Payer: Self-pay | Admitting: *Deleted

## 2014-01-03 MED ORDER — BENAZEPRIL-HYDROCHLOROTHIAZIDE 20-12.5 MG PO TABS: ORAL_TABLET | ORAL | Status: DC

## 2014-01-06 ENCOUNTER — Other Ambulatory Visit: Payer: Self-pay | Admitting: Nurse Practitioner

## 2014-01-07 NOTE — Telephone Encounter (Signed)
rx ready for pickup 

## 2014-01-07 NOTE — Telephone Encounter (Signed)
Last seen 11/08/13  MMM If approved print and route to nurse

## 2014-01-08 NOTE — Telephone Encounter (Signed)
Called in.

## 2014-01-09 ENCOUNTER — Encounter: Payer: Self-pay | Admitting: *Deleted

## 2014-02-04 ENCOUNTER — Other Ambulatory Visit: Payer: Self-pay | Admitting: Nurse Practitioner

## 2014-02-05 NOTE — Telephone Encounter (Signed)
Last seen and filled with 2 RF on 11/08/13. Call into [facility]

## 2014-02-06 ENCOUNTER — Ambulatory Visit (INDEPENDENT_AMBULATORY_CARE_PROVIDER_SITE_OTHER): Payer: Medicare HMO | Admitting: Nurse Practitioner

## 2014-02-06 ENCOUNTER — Encounter: Payer: Self-pay | Admitting: Nurse Practitioner

## 2014-02-06 VITALS — BP 121/63 | HR 52 | Temp 97.8°F | Ht 65.0 in | Wt 177.0 lb

## 2014-02-06 DIAGNOSIS — Z13820 Encounter for screening for osteoporosis: Secondary | ICD-10-CM

## 2014-02-06 DIAGNOSIS — F32A Depression, unspecified: Secondary | ICD-10-CM

## 2014-02-06 DIAGNOSIS — F3289 Other specified depressive episodes: Secondary | ICD-10-CM

## 2014-02-06 DIAGNOSIS — I10 Essential (primary) hypertension: Secondary | ICD-10-CM

## 2014-02-06 DIAGNOSIS — E039 Hypothyroidism, unspecified: Secondary | ICD-10-CM

## 2014-02-06 DIAGNOSIS — G589 Mononeuropathy, unspecified: Secondary | ICD-10-CM

## 2014-02-06 DIAGNOSIS — E785 Hyperlipidemia, unspecified: Secondary | ICD-10-CM

## 2014-02-06 DIAGNOSIS — Z713 Dietary counseling and surveillance: Secondary | ICD-10-CM

## 2014-02-06 DIAGNOSIS — Z6829 Body mass index (BMI) 29.0-29.9, adult: Secondary | ICD-10-CM

## 2014-02-06 DIAGNOSIS — F411 Generalized anxiety disorder: Secondary | ICD-10-CM

## 2014-02-06 DIAGNOSIS — F329 Major depressive disorder, single episode, unspecified: Secondary | ICD-10-CM

## 2014-02-06 DIAGNOSIS — G629 Polyneuropathy, unspecified: Secondary | ICD-10-CM

## 2014-02-06 DIAGNOSIS — G4700 Insomnia, unspecified: Secondary | ICD-10-CM

## 2014-02-06 MED ORDER — TRAMADOL HCL 50 MG PO TABS: ORAL_TABLET | ORAL | Status: DC

## 2014-02-06 MED ORDER — SIMVASTATIN 40 MG PO TABS: ORAL_TABLET | ORAL | Status: DC

## 2014-02-06 MED ORDER — ZOLPIDEM TARTRATE 10 MG PO TABS: ORAL_TABLET | ORAL | Status: DC

## 2014-02-06 MED ORDER — LEVOTHYROXINE SODIUM 75 MCG PO TABS: 75.0000 ug | ORAL_TABLET | Freq: Every day | ORAL | Status: DC

## 2014-02-06 MED ORDER — GABAPENTIN 100 MG PO CAPS: 100.0000 mg | ORAL_CAPSULE | Freq: Three times a day (TID) | ORAL | Status: DC

## 2014-02-06 MED ORDER — ALPRAZOLAM 0.25 MG PO TABS: ORAL_TABLET | ORAL | Status: DC

## 2014-02-06 NOTE — Progress Notes (Signed)
Subjective:    Patient ID: Phyllis Walker, female    DOB: 04/25/1946, 68 y.o.   MRN: [id number]  Patient here today for follow up of chronic medical problems.  Hypertension This is a chronic problem. The current episode started more than 1 year ago. The problem is unchanged. The problem is controlled. Pertinent negatives include no blurred vision, chest pain, headaches, neck pain, palpitations, peripheral edema or shortness of breath. There are no associated agents to hypertension. Risk factors for coronary artery disease include dyslipidemia and family history. Past treatments include ACE inhibitors and diuretics. The current treatment provides moderate improvement. Compliance problems include diet and exercise.  Hypertensive end-organ damage includes a thyroid problem.  Thyroid Problem Presents for follow-up (hypothyroidism- was told with last labs to decrease levothroxin to 8mg but rx never got called in so she has remained on 720m daily.) visit. Patient reports no anxiety, diarrhea, dry skin, hoarse voice, menstrual problem, palpitations, visual change, weight gain or weight loss. Her past medical history is significant for hyperlipidemia. There is no history of diabetes.  Hyperlipidemia This is a chronic problem. The current episode started more than 1 year ago. The problem is controlled. Recent lipid tests were reviewed and are normal. Exacerbating diseases include hypothyroidism. She has no history of diabetes or obesity. Pertinent negatives include no chest pain or shortness of breath. Current antihyperlipidemic treatment includes statins. The current treatment provides mild improvement of lipids. Compliance problems include adherence to diet.  Risk factors for coronary artery disease include dyslipidemia, family history and post-menopausal.  Foot neuropathy Neurotin is helping- patient says she goes dancing a lot and her feet are doing ok- occasional burning Depression/Gad Zoloft and  xanax- combination working well.  Review of Systems  Constitutional: Negative for weight loss and weight gain.  HENT: Negative for hoarse voice.   Eyes: Negative for blurred vision.  Respiratory: Negative for shortness of breath.   Cardiovascular: Negative for chest pain and palpitations.  Gastrointestinal: Negative for diarrhea.  Genitourinary: Negative for menstrual problem.  Musculoskeletal: Negative for neck pain.  Neurological: Negative for headaches.  All other systems reviewed and are negative.      Objective:   Physical Exam  Constitutional: She is oriented to person, place, and time. She appears well-developed and well-nourished.  HENT:  Nose: Nose normal.  Mouth/Throat: Oropharynx is clear and moist.  Eyes: EOM are normal.  Neck: Trachea normal, normal range of motion and full passive range of motion without pain. Neck supple. No JVD present. Carotid bruit is not present. No thyromegaly present.  Cardiovascular: Normal rate, regular rhythm, normal heart sounds and intact distal pulses.  Exam reveals no gallop and no friction rub.   No murmur heard. Pulmonary/Chest: Effort normal and breath sounds normal.  Abdominal: Soft. Bowel sounds are normal. She exhibits no distension and no mass. There is no tenderness.  Musculoskeletal: Normal range of motion.  Lymphadenopathy:    She has no cervical adenopathy.  Neurological: She is alert and oriented to person, place, and time. She has normal reflexes.  Skin: Skin is warm and dry.  Psychiatric: She has a normal mood and affect. Her behavior is normal. Judgment and thought content normal.   BP 121/63  Pulse 52  Temp(Src) 97.8 F (36.6 C) (Oral)  Ht '5\' 5"'  (1.651 m)  Wt 177 lb (80.287 kg)  BMI 29.45 kg/m2        Assessment & Plan:   1. Unspecified hypothyroidism   2. Other and unspecified hyperlipidemia  3. Hypertension, benign essential, goal below 140/90   4. GAD (generalized anxiety disorder)   5. Depression     6. Neuropathy   7. Insomnia   8. Osteoporosis screening   9. BMI 29.0-29.9,adult   10. Weight loss counseling, encounter for     Orders Placed This Encounter  Procedures  . DG Bone Density    Standing Status: Future     Number of Occurrences:      Standing Expiration Date: 04/08/2015    Order Specific Question:  Reason for Exam (SYMPTOM  OR DIAGNOSIS REQUIRED)    Answer:  screening    Order Specific Question:  Preferred imaging location?    Answer:  Internal  . CMP14+EGFR  . NMR, lipoprofile   Meds ordered this encounter  Medications  . gabapentin (NEURONTIN) 100 MG capsule    Sig: Take 1 capsule (100 mg total) by mouth 3 (three) times daily.    Dispense:  90 capsule    Refill:  5    Order Specific Question:  Supervising Provider    Answer:  Chipper Herb [[id number]]  . levothyroxine (SYNTHROID, LEVOTHROID) 75 MCG tablet    Sig: Take 1 tablet (75 mcg total) by mouth daily.    Dispense:  90 tablet    Refill:  2    Order Specific Question:  Supervising Provider    Answer:  Chipper Herb [[id number]]  . ALPRAZolam (XANAX) 0.25 MG tablet    Sig: TAKE  (1)  TABLET TWICE A DAY AS NEEDED.    Dispense:  60 tablet    Refill:  1    Order Specific Question:  Supervising Provider    Answer:  Chipper Herb [[id number]]  . simvastatin (ZOCOR) 40 MG tablet    Sig: Take 1 tablet by mouth at  bedtime    Dispense:  90 tablet    Refill:  1    Order Specific Question:  Supervising Provider    Answer:  Chipper Herb [[id number]]  . zolpidem (AMBIEN) 10 MG tablet    Sig: TAKE 1 TABLET AT BEDTIME AS NEEDED FOR SLEEP    Dispense:  30 tablet    Refill:  1    Order Specific Question:  Supervising Provider    Answer:  Chipper Herb [[id number]]  . traMADol (ULTRAM) 50 MG tablet    Sig: TAKE  (1)  TABLET TWICE A DAY AS NEEDED.    Dispense:  60 tablet    Refill:  1    Order Specific Question:  Supervising Provider    Answer:  Chipper Herb [[id number]]    Labs pending Health maintenance reviewed Diet and  exercise encouraged Continue all meds Follow up  In 3 months   [place], FNP

## 2014-02-06 NOTE — Telephone Encounter (Signed)
Please call in ambien with 1 refills 

## 2014-02-06 NOTE — Telephone Encounter (Signed)
CALLED IN 

## 2014-02-06 NOTE — Patient Instructions (Signed)
Medicare Annual Wellness Visit  [place] and the medical providers at [facility] strive to bring you the best medical care.  In doing so we not only want to address your current medical conditions and concerns but also to detect new conditions early and prevent illness, disease and health-related problems.    Medicare offers a yearly Wellness Visit which allows our clinical staff to assess your need for preventative services including immunizations, lifestyle education, counseling to decrease risk of preventable diseases and screening for fall risk and other medical concerns.    This visit is provided free of charge (no copay) for all Medicare recipients. The clinical pharmacists at [facility] have begun to conduct these Wellness Visits which will also include a thorough review of all your medications.    As you primary medical provider recommend that you make an appointment for your Annual Wellness Visit if you have not done so already this year.  You may set up this appointment before you leave today or you may call back ([phone]) and schedule an appointment.  Please make sure when you call that you mention that you are scheduling your Annual Wellness Visit with the clinical pharmacist so that the appointment may be made for the proper length of time.    

## 2014-02-07 ENCOUNTER — Ambulatory Visit: Payer: Medicare HMO | Admitting: Nurse Practitioner

## 2014-02-07 LAB — CMP14+EGFR
ALK PHOS: 97 IU/L (ref 39–117)
ALT: 9 IU/L (ref 0–32)
AST: 16 IU/L (ref 0–40)
Albumin/Globulin Ratio: 2.2 (ref 1.1–2.5)
Albumin: 4.3 g/dL (ref 3.6–4.8)
BILIRUBIN TOTAL: 0.3 mg/dL (ref 0.0–1.2)
BUN / CREAT RATIO: 20 (ref 11–26)
BUN: 15 mg/dL (ref 8–27)
CHLORIDE: 101 mmol/L (ref 97–108)
CO2: 27 mmol/L (ref 18–29)
Calcium: 9.2 mg/dL (ref 8.7–10.3)
Creatinine, Ser: 0.75 mg/dL (ref 0.57–1.00)
GFR calc non Af Amer: 82 mL/min/{1.73_m2} (ref >59)
GFR, EST AFRICAN AMERICAN: 95 mL/min/{1.73_m2} (ref >59)
Globulin, Total: 2 g/dL (ref 1.5–4.5)
Glucose: 75 mg/dL (ref 65–99)
POTASSIUM: 4.6 mmol/L (ref 3.5–5.2)
Sodium: 143 mmol/L (ref 134–144)
Total Protein: 6.3 g/dL (ref 6.0–8.5)

## 2014-02-07 LAB — NMR, LIPOPROFILE
Cholesterol: 167 mg/dL (ref 100–199)
HDL Cholesterol by NMR: 67 mg/dL (ref >39)
HDL Particle Number: 39.6 umol/L (ref >=30.5)
LDL PARTICLE NUMBER: 1053 nmol/L — AB (ref <1000)
LDL SIZE: 21.2 nm (ref >20.5)
LDLC SERPL CALC-MCNC: 77 mg/dL (ref 0–99)
Small LDL Particle Number: 318 nmol/L (ref <=527)
Triglycerides by NMR: 113 mg/dL (ref 0–149)

## 2014-02-21 ENCOUNTER — Ambulatory Visit (INDEPENDENT_AMBULATORY_CARE_PROVIDER_SITE_OTHER): Payer: Medicare HMO | Admitting: Pharmacist

## 2014-02-21 ENCOUNTER — Encounter: Payer: Self-pay | Admitting: Pharmacist

## 2014-02-21 VITALS — BP 110/64 | HR 62 | Ht 64.0 in | Wt 174.0 lb

## 2014-02-21 DIAGNOSIS — Z0000 Encounter for general adult medical examination without abnormal findings: Secondary | ICD-10-CM

## 2014-02-21 DIAGNOSIS — G2581 Restless legs syndrome: Secondary | ICD-10-CM | POA: Insufficient documentation

## 2014-02-21 DIAGNOSIS — G629 Polyneuropathy, unspecified: Secondary | ICD-10-CM | POA: Insufficient documentation

## 2014-02-21 NOTE — Patient Instructions (Signed)
Health Maintenance Summary    INFLUENZA VACCINE Next Due 04/12/2014  Last 06/18/2013    MAMMOGRAM Next Due 07/11/2014  Last 10/30/213   DEXA / bone density Next Due  04/2014 Last 04/2012   Shingles / Zostavax vaccine Completed 05/24/2013    Pneumonia Vaccine Completed 07/29/2010     TETANUS/TDAP Next Due 04/12/2018  Last 04/12/2008    COLONOSCOPY Next Due 06/12/2020  Last 06/12/2010          Preventive Care for Adults, Female A healthy lifestyle and preventive care can promote health and wellness. Preventive health guidelines for women include the following key practices.  A routine yearly physical is a good way to check with your health care provider about your health and preventive screening. It is a chance to share any concerns and updates on your health and to receive a thorough exam.  Visit your dentist for a routine exam and preventive care every 6 months. Brush your teeth twice a day and floss once a day. Good oral hygiene prevents tooth decay and gum disease.  The frequency of eye exams is based on your age, health, family medical history, use of contact lenses, and other factors. Follow your health care provider's recommendations for frequency of eye exams.  Eat a healthy diet. Foods like vegetables, fruits, whole grains, low-fat dairy products, and lean protein foods contain the nutrients you need without too many calories. Decrease your intake of foods high in solid fats, added sugars, and salt. Eat the right amount of calories for you.Get information about a proper diet from your health care provider, if necessary.  Regular physical exercise is one of the most important things you can do for your health. Most adults should get at least 150 minutes of moderate-intensity exercise (any activity that increases your heart rate and causes you to sweat) each week. In addition, most adults need muscle-strengthening exercises on 2 or more days a week.  Maintain a healthy weight. The body  mass index (BMI) is a screening tool to identify possible weight problems. It provides an estimate of body fat based on height and weight. Your health care provider can find your BMI, and can help you achieve or maintain a healthy weight.For adults 20 years and older:  A BMI below 18.5 is considered underweight.  A BMI of 18.5 to 24.9 is normal.  A BMI of 25 to 29.9 is considered overweight.  A BMI of 30 and above is considered obese.  Maintain normal blood lipids and cholesterol levels by exercising and minimizing your intake of saturated fat. Eat a balanced diet with plenty of fruit and vegetables. Blood tests for lipids and cholesterol should begin at age 54 and be repeated every 5 years. If your lipid or cholesterol levels are high, you are over 50, or you are at high risk for heart disease, you may need your cholesterol levels checked more frequently.Ongoing high lipid and cholesterol levels should be treated with medicines if diet and exercise are not working.  If you smoke, find out from your health care provider how to quit. If you do not use tobacco, do not start.  Lung cancer screening is recommended for adults aged 47 80 years who are at high risk for developing lung cancer because of a history of smoking. A yearly low-dose CT scan of the lungs is recommended for people who have at least a 30-pack-year history of smoking and are a current smoker or have quit within the past 15 years. A pack  year of smoking is smoking an average of 1 pack of cigarettes a day for 1 year (for example: 1 pack a day for 30 years or 2 packs a day for 15 years). Yearly screening should continue until the smoker has stopped smoking for at least 15 years. Yearly screening should be stopped for people who develop a health problem that would prevent them from having lung cancer treatment.  If you are pregnant, do not drink alcohol. If you are breastfeeding, be very cautious about drinking alcohol. If you are not  pregnant and choose to drink alcohol, do not have more than 1 drink per day. One drink is considered to be 12 ounces (355 mL) of beer, 5 ounces (148 mL) of wine, or 1.5 ounces (44 mL) of liquor.  Avoid use of street drugs. Do not share needles with anyone. Ask for help if you need support or instructions about stopping the use of drugs.  High blood pressure causes heart disease and increases the risk of stroke. Your blood pressure should be checked at least every 1 to 2 years. Ongoing high blood pressure should be treated with medicines if weight loss and exercise do not work.  If you are 97 69 years old, ask your health care provider if you should take aspirin to prevent strokes.  Diabetes screening involves taking a blood sample to check your fasting blood sugar level. This should be done once every 3 years, after age 6, if you are within normal weight and without risk factors for diabetes. Testing should be considered at a younger age or be carried out more frequently if you are overweight and have at least 1 risk factor for diabetes.  Breast cancer screening is essential preventive care for women. You should practice "breast self-awareness." This means understanding the normal appearance and feel of your breasts and may include breast self-examination. Any changes detected, no matter how small, should be reported to a health care provider. Women in their 79s and 30s should have a clinical breast exam (CBE) by a health care provider as part of a regular health exam every 1 to 3 years. After age 72, women should have a CBE every year. Starting at age 29, women should consider having a mammogram (breast X-ray test) every year. Women who have a family history of breast cancer should talk to their health care provider about genetic screening. Women at a high risk of breast cancer should talk to their health care providers about having an MRI and a mammogram every year.  Breast cancer gene (BRCA)-related  cancer risk assessment is recommended for women who have family members with BRCA-related cancers. BRCA-related cancers include breast, ovarian, tubal, and peritoneal cancers. Having family members with these cancers may be associated with an increased risk for harmful changes (mutations) in the breast cancer genes BRCA1 and BRCA2. Results of the assessment will determine the need for genetic counseling and BRCA1 and BRCA2 testing.  The Pap test is a screening test for cervical cancer. A Pap test can show cell changes on the cervix that might become cervical cancer if left untreated. A Pap test is a procedure in which cells are obtained and examined from the lower end of the uterus (cervix).  Women should have a Pap test starting at age 34.  Between ages 55 and 4, Pap tests should be repeated every 2 years.  Beginning at age 56, you should have a Pap test every 3 years as long as the past 3 Pap  tests have been normal.  Some women have medical problems that increase the chance of getting cervical cancer. Talk to your health care provider about these problems. It is especially important to talk to your health care provider if a new problem develops soon after your last Pap test. In these cases, your health care provider may recommend more frequent screening and Pap tests.  The above recommendations are the same for women who have or have not gotten the vaccine for human papillomavirus (HPV).  If you had a hysterectomy for a problem that was not cancer or a condition that could lead to cancer, then you no longer need Pap tests. Even if you no longer need a Pap test, a regular exam is a good idea to make sure no other problems are starting.  If you are between ages 59 and 29 years, and you have had normal Pap tests going back 10 years, you no longer need Pap tests. Even if you no longer need a Pap test, a regular exam is a good idea to make sure no other problems are starting.  If you have had past  treatment for cervical cancer or a condition that could lead to cancer, you need Pap tests and screening for cancer for at least 20 years after your treatment.  If Pap tests have been discontinued, risk factors (such as a new sexual partner) need to be reassessed to determine if screening should be resumed.  The HPV test is an additional test that may be used for cervical cancer screening. The HPV test looks for the virus that can cause the cell changes on the cervix. The cells collected during the Pap test can be tested for HPV. The HPV test could be used to screen women aged 38 years and older, and should be used in women of any age who have unclear Pap test results. After the age of 31, women should have HPV testing at the same frequency as a Pap test.  Colorectal cancer can be detected and often prevented. Most routine colorectal cancer screening begins at the age of 81 years and continues through age 47 years. However, your health care provider may recommend screening at an earlier age if you have risk factors for colon cancer. On a yearly basis, your health care provider may provide home test kits to check for hidden blood in the stool. Use of a small camera at the end of a tube, to directly examine the colon (sigmoidoscopy or colonoscopy), can detect the earliest forms of colorectal cancer. Talk to your health care provider about this at age 31, when routine screening begins. Direct exam of the colon should be repeated every 5 10 years through age 56 years, unless early forms of pre-cancerous polyps or small growths are found.  People who are at an increased risk for hepatitis B should be screened for this virus. You are considered at high risk for hepatitis B if:  You were born in a country where hepatitis B occurs often. Talk with your health care provider about which countries are considered high risk.  Your parents were born in a high-risk country and you have not received a shot to protect  against hepatitis B (hepatitis B vaccine).  You have HIV or AIDS.  You use needles to inject street drugs.  You live with, or have sex with, someone who has Hepatitis B.  You get hemodialysis treatment.  You take certain medicines for conditions like cancer, organ transplantation, and autoimmune conditions.  Hepatitis C blood testing is recommended for all people born from [birth year] through 1966 and any individual with known risks for hepatitis C.  Practice safe sex. Use condoms and avoid high-risk sexual practices to reduce the spread of sexually transmitted infections (STIs). STIs include gonorrhea, chlamydia, syphilis, trichomonas, herpes, HPV, and human immunodeficiency virus (HIV). Herpes, HIV, and HPV are viral illnesses that have no cure. They can result in disability, cancer, and death. Sexually active women aged 58 years and younger should be checked for chlamydia. Older women with new or multiple partners should also be tested for chlamydia. Testing for other STIs is recommended if you are sexually active and at increased risk.  Osteoporosis is a disease in which the bones lose minerals and strength with aging. This can result in serious bone fractures or breaks. The risk of osteoporosis can be identified using a bone density scan. Women ages 54 years and over and women at risk for fractures or osteoporosis should discuss screening with their health care providers. Ask your health care provider whether you should take a calcium supplement or vitamin D to reduce the rate of osteoporosis.  Menopause can be associated with physical symptoms and risks. Hormone replacement therapy is available to decrease symptoms and risks. You should talk to your health care provider about whether hormone replacement therapy is right for you.  Use sunscreen. Apply sunscreen liberally and repeatedly throughout the day. You should seek shade when your shadow is shorter than you. Protect yourself by wearing long  sleeves, pants, a wide-brimmed hat, and sunglasses year round, whenever you are outdoors.  Once a month, do a whole body skin exam, using a mirror to look at the skin on your back. Tell your health care provider of new moles, moles that have irregular borders, moles that are larger than a pencil eraser, or moles that have changed in shape or color.  Stay current with required vaccines (immunizations).  Influenza vaccine. All adults should be immunized every year.  Tetanus, diphtheria, and acellular pertussis (Td, Tdap) vaccine. Pregnant women should receive 1 dose of Tdap vaccine during each pregnancy. The dose should be obtained regardless of the length of time since the last dose. Immunization is preferred during the 27th 36th week of gestation. An adult who has not previously received Tdap or who does not know her vaccine status should receive 1 dose of Tdap. This initial dose should be followed by tetanus and diphtheria toxoids (Td) booster doses every 10 years. Adults with an unknown or incomplete history of completing a 3-dose immunization series with Td-containing vaccines should begin or complete a primary immunization series including a Tdap dose. Adults should receive a Td booster every 10 years.  Varicella vaccine. An adult without evidence of immunity to varicella should receive 2 doses or a second dose if she has previously received 1 dose. Pregnant females who do not have evidence of immunity should receive the first dose after pregnancy. This first dose should be obtained before leaving the health care facility. The second dose should be obtained 4 8 weeks after the first dose.  Human papillomavirus (HPV) vaccine. Females aged 28 26 years who have not received the vaccine previously should obtain the 3-dose series. The vaccine is not recommended for use in pregnant females. However, pregnancy testing is not needed before receiving a dose. If a female is found to be pregnant after receiving  a dose, no treatment is needed. In that case, the remaining doses should be delayed until after  the pregnancy. Immunization is recommended for any person with an immunocompromised condition through the age of 59 years if she did not get any or all doses earlier. During the 3-dose series, the second dose should be obtained 4 8 weeks after the first dose. The third dose should be obtained 24 weeks after the first dose and 16 weeks after the second dose.  Zoster vaccine. One dose is recommended for adults aged 72 years or older unless certain conditions are present.  Measles, mumps, and rubella (MMR) vaccine. Adults born before [birth year] generally are considered immune to measles and mumps. Adults born in [birth year] or later should have 1 or more doses of MMR vaccine unless there is a contraindication to the vaccine or there is laboratory evidence of immunity to each of the three diseases. A routine second dose of MMR vaccine should be obtained at least 28 days after the first dose for students attending postsecondary schools, health care workers, or international travelers. People who received inactivated measles vaccine or an unknown type of measles vaccine during 1963 1967 should receive 2 doses of MMR vaccine. People who received inactivated mumps vaccine or an unknown type of mumps vaccine before 1979 and are at high risk for mumps infection should consider immunization with 2 doses of MMR vaccine. For females of childbearing age, rubella immunity should be determined. If there is no evidence of immunity, females who are not pregnant should be vaccinated. If there is no evidence of immunity, females who are pregnant should delay immunization until after pregnancy. Unvaccinated health care workers born before [birth year] who lack laboratory evidence of measles, mumps, or rubella immunity or laboratory confirmation of disease should consider measles and mumps immunization with 2 doses of MMR vaccine or rubella immunization with 1  dose of MMR vaccine.  Pneumococcal 13-valent conjugate (PCV13) vaccine. When indicated, a person who is uncertain of her immunization history and has no record of immunization should receive the PCV13 vaccine. An adult aged 87 years or older who has certain medical conditions and has not been previously immunized should receive 1 dose of PCV13 vaccine. This PCV13 should be followed with a dose of pneumococcal polysaccharide (PPSV23) vaccine. The PPSV23 vaccine dose should be obtained at least 8 weeks after the dose of PCV13 vaccine. An adult aged 109 years or older who has certain medical conditions and previously received 1 or more doses of PPSV23 vaccine should receive 1 dose of PCV13. The PCV13 vaccine dose should be obtained 1 or more years after the last PPSV23 vaccine dose.  Pneumococcal polysaccharide (PPSV23) vaccine. When PCV13 is also indicated, PCV13 should be obtained first. All adults aged 72 years and older should be immunized. An adult younger than age 44 years who has certain medical conditions should be immunized. Any person who resides in a nursing home or long-term care facility should be immunized. An adult smoker should be immunized. People with an immunocompromised condition and certain other conditions should receive both PCV13 and PPSV23 vaccines. People with human immunodeficiency virus (HIV) infection should be immunized as soon as possible after diagnosis. Immunization during chemotherapy or radiation therapy should be avoided. Routine use of PPSV23 vaccine is not recommended for American Indians, [place] Natives, or people younger than 65 years unless there are medical conditions that require PPSV23 vaccine. When indicated, people who have unknown immunization and have no record of immunization should receive PPSV23 vaccine. One-time revaccination 5 years after the first dose of PPSV23 is recommended for people aged 22 64  years who have chronic kidney failure, nephrotic syndrome,  asplenia, or immunocompromised conditions. People who received 1 2 doses of PPSV23 before age 35 years should receive another dose of PPSV23 vaccine at age 83 years or later if at least 5 years have passed since the previous dose. Doses of PPSV23 are not needed for people immunized with PPSV23 at or after age 95 years.  Meningococcal vaccine. Adults with asplenia or persistent complement component deficiencies should receive 2 doses of quadrivalent meningococcal conjugate (MenACWY-D) vaccine. The doses should be obtained at least 2 months apart. Microbiologists working with certain meningococcal bacteria, [employer], people at risk during an outbreak, and people who travel to or live in countries with a high rate of meningitis should be immunized. A first-year college student up through age 9 years who is living in a residence hall should receive a dose if she did not receive a dose on or after her 16th birthday. Adults who have certain high-risk conditions should receive one or more doses of vaccine.  Hepatitis A vaccine. Adults who wish to be protected from this disease, have certain high-risk conditions, work with hepatitis A-infected animals, work in hepatitis A research labs, or travel to or work in countries with a high rate of hepatitis A should be immunized. Adults who were previously unvaccinated and who anticipate close contact with an international adoptee during the first 60 days after arrival in the [place] from a country with a high rate of hepatitis A should be immunized.  Hepatitis B vaccine. Adults who wish to be protected from this disease, have certain high-risk conditions, may be exposed to blood or other infectious body fluids, are household contacts or sex partners of hepatitis B positive people, are clients or workers in certain care facilities, or travel to or work in countries with a high rate of hepatitis B should be immunized.

## 2014-02-21 NOTE — Progress Notes (Signed)
Subjective:    Phyllis Walker is a 68 y.o. female who presents for Medicare Initial Wellness Visit.  Preventive Screening-Counseling & Management  Tobacco History  Smoking status  . Former Smoker  . Types: Cigarettes  . Quit date: 12/21/2006  Smokeless tobacco  . Never Used    Current Problems (verified) Patient Active Problem List   Diagnosis Date Noted  . Restless leg syndrome 02/21/2014  . Neuropathy 02/21/2014  . Other and unspecified hyperlipidemia 12/21/2012  . Unspecified hypothyroidism 12/21/2012  . GAD (generalized anxiety disorder) 12/21/2012  . Hypertension, benign essential, goal below 140/90 12/21/2012  . Depression 12/21/2012    Medications Prior to Visit Current Outpatient Prescriptions on File Prior to Visit  Medication Sig Dispense Refill  . ALPRAZolam (XANAX) 0.25 MG tablet TAKE  (1)  TABLET TWICE A DAY AS NEEDED.  60 tablet  1  . benazepril-hydrochlorthiazide (LOTENSIN HCT) 20-12.5 MG per tablet Take 1 tablet by mouth  daily  90 tablet  1  . cetirizine (ZYRTEC) 10 MG tablet Take 10 mg by mouth daily.      [name] gabapentin (NEURONTIN) 100 MG capsule Take 1 capsule (100 mg total) by mouth 3 (three) times daily.  90 capsule  5  . levothyroxine (SYNTHROID, LEVOTHROID) 75 MCG tablet Take 1 tablet (75 mcg total) by mouth daily.  90 tablet  2  . rOPINIRole (REQUIP) 0.5 MG tablet Take 1 tablet (0.5 mg total) by mouth at bedtime.  30 tablet  5  . sertraline (ZOLOFT) 50 MG tablet Take 1 tablet by mouth  daily  90 tablet  0  . simvastatin (ZOCOR) 40 MG tablet Take 1 tablet by mouth at  bedtime  90 tablet  1  . traMADol (ULTRAM) 50 MG tablet TAKE  (1)  TABLET TWICE A DAY AS NEEDED.  60 tablet  1  . zolpidem (AMBIEN) 10 MG tablet TAKE 1 TABLET AT BEDTIME AS NEEDED FOR SLEEP  30 tablet  1   No current facility-administered medications on file prior to visit.    Current Medications (verified) Current Outpatient Prescriptions  Medication Sig Dispense Refill  . ALPRAZolam  (XANAX) 0.25 MG tablet TAKE  (1)  TABLET TWICE A DAY AS NEEDED.  60 tablet  1  . benazepril-hydrochlorthiazide (LOTENSIN HCT) 20-12.5 MG per tablet Take 1 tablet by mouth  daily  90 tablet  1  . cetirizine (ZYRTEC) 10 MG tablet Take 10 mg by mouth daily.      [name] gabapentin (NEURONTIN) 100 MG capsule Take 1 capsule (100 mg total) by mouth 3 (three) times daily.  90 capsule  5  . levothyroxine (SYNTHROID, LEVOTHROID) 75 MCG tablet Take 1 tablet (75 mcg total) by mouth daily.  90 tablet  2  . rOPINIRole (REQUIP) 0.5 MG tablet Take 1 tablet (0.5 mg total) by mouth at bedtime.  30 tablet  5  . sertraline (ZOLOFT) 50 MG tablet Take 1 tablet by mouth  daily  90 tablet  0  . simvastatin (ZOCOR) 40 MG tablet Take 1 tablet by mouth at  bedtime  90 tablet  1  . traMADol (ULTRAM) 50 MG tablet TAKE  (1)  TABLET TWICE A DAY AS NEEDED.  60 tablet  1  . zolpidem (AMBIEN) 10 MG tablet TAKE 1 TABLET AT BEDTIME AS NEEDED FOR SLEEP  30 tablet  1   No current facility-administered medications for this visit.     Allergies (verified) Review of patient's allergies indicates no known allergies.   PAST HISTORY  Family History Family History  Problem Relation Age of Onset  . Cancer Mother     pancreas  . Heart attack Father   . Hypertension Sister   . Hyperlipidemia Sister   . Ovarian cancer Maternal Aunt   . Hypertension Sister   . Hyperlipidemia Sister   . Early death Sister 63    accident    Social History History  Substance Use Topics  . Smoking status: Former Smoker    Types: Cigarettes    Quit date: 12/21/2006  . Smokeless tobacco: Never Used  . Alcohol Use: No     Are there smokers in your home (other than you)? No  Risk Factors Current exercise habits: Home exercise routine includes regular dancing.  Dietary issues discussed: none - patient has lost about 30# over the last year with changes in diet and increased exercise  Cardiac risk factors: advanced age (older than 24 for men, 25 for  women), dyslipidemia and hypertension.  Depression Screen - currently taking sertraline daily (Note: if answer to either of the following is "Yes", a more complete depression screening is indicated)   Over the past 2 weeks, have you felt down, depressed or hopeless? No  Over the past 2 weeks, have you felt little interest or pleasure in doing things? No  Have you lost interest or pleasure in daily life? No  Do you often feel hopeless? No  Do you cry easily over simple problems? No  Activities of Daily Living In your present state of health, do you have any difficulty performing the following activities?:  Driving? No Managing money?  No Feeding yourself? No Getting from bed to chair? No  Climbing a flight of stairs? No Preparing food and eating?: No Bathing or showering? No Getting dressed: No Getting to the toilet? No Using the toilet:No Moving around from place to place: No In the past year have you fallen or had a near fall?:No   Are you sexually active?  No  Do you have more than one partner?  No  Hearing Difficulties: No Do you often ask people to speak up or repeat themselves? No Do you experience ringing or noises in your ears? No Do you have difficulty understanding soft or whispered voices? No   Do you feel that you have a problem with memory? No  Do you often misplace items? No  Do you feel safe at home?  Yes  Cognitive Testing  Alert? Yes  Normal Appearance?Yes  Oriented to person? Yes  Place? Yes   Time? Yes  Recall of three objects?  Yes  Can perform simple calculations? Yes  Displays appropriate judgment?Yes  Can read the correct time from a watch face?Yes   Advanced Directives have been discussed with the patient? Yes  List the Names of Other Physician/Practitioners you currently use: 1.  Dr Sabra Heck - opthamologist  Indicate any recent Medical Services you may have received from other than [facility] providers in the past year (date may be  approximate).  Immunization History  Administered Date(s) Administered  . Influenza Split 06/18/2013  . Zoster 05/24/2013    Screening Tests Health Maintenance  Topic Date Due  . Influenza Vaccine  04/12/2014  . Mammogram  07/11/2014  . Tetanus/tdap  04/12/2018  . Colonoscopy  06/12/2020  . Pneumococcal Polysaccharide Vaccine Age 23 And Over  Completed  . Zostavax  Completed   Last eye exam 2 years ago - due to recheck in next 1-2 months Repeat Dexa is scheduled for August  2015  All answers were reviewed with the patient and necessary referrals were made:  Cherre Robins, [facility]   02/21/2014   History reviewed: allergies, current medications, past family history, past medical history, past social history, past surgical history and problem list  Objective:     Body mass index is 29.85 kg/(m^2). BP 110/64  Pulse 62  Ht 5\' 4"  (1.626 m)  Wt 174 lb (78.926 kg)  BMI 29.85 kg/m2   Assessment:     Initial Annual Medicare Wellness Visit     Plan:     During the course of the visit the patient was educated and counseled about appropriate screening and preventive services including:    Pneumococcal vaccine   Influenza vaccine  Hepatitis B vaccine  Td vaccine  Screening electrocardiogram  Screening mammography  Screening Pap smear and pelvic exam   Bone densitometry screening  Colorectal cancer screening  Diabetes screening  Glaucoma screening  Advanced directives: has NO advanced directive  - add't info requested. Referral to SW: given [facility] packet  Continue regular exercise and dietary changes  Recommend add B12 level, Vitamin D and magnesium to next lab draw due 04/2014  Diet review for nutrition referral? Yes ____  Not Indicated ___X_   Patient Instructions (the written plan) was given to the patient.  Medicare Attestation I have personally reviewed: The patient's medical and social history Their use of alcohol, tobacco or  illicit drugs Their current medications and supplements The patient's functional ability including ADLs,fall risks, home safety risks, cognitive, and hearing and visual impairment Diet and physical activities Evidence for depression or mood disorders  The patient's weight, height, BMI, and BP/HR have been recorded in the chart.  I have made referrals, counseling, and provided education to the patient based on review of the above and I have provided the patient with a written personalized care plan for preventive services.     Cherre Robins, [facility]   02/21/2014

## 2014-03-31 ENCOUNTER — Other Ambulatory Visit: Payer: Self-pay | Admitting: Nurse Practitioner

## 2014-04-01 NOTE — Telephone Encounter (Signed)
RX called in .

## 2014-04-01 NOTE — Telephone Encounter (Signed)
Patient last seen in office on 02-21-14 by pharmacist. Last seen by you on 02-06-14. Rx last filled on 03-03-14 for #30. Please advise. If approved please route to Pool B so nurse can phone in to pharmacy

## 2014-04-01 NOTE — Telephone Encounter (Signed)
Please call in ambien with 1 refills 

## 2014-04-26 ENCOUNTER — Other Ambulatory Visit: Payer: Self-pay | Admitting: Nurse Practitioner

## 2014-04-28 NOTE — Telephone Encounter (Signed)
Last refill 5/15.

## 2014-04-30 ENCOUNTER — Ambulatory Visit: Payer: Medicare HMO

## 2014-04-30 ENCOUNTER — Ambulatory Visit (INDEPENDENT_AMBULATORY_CARE_PROVIDER_SITE_OTHER): Payer: Medicare HMO

## 2014-04-30 DIAGNOSIS — Z13820 Encounter for screening for osteoporosis: Secondary | ICD-10-CM

## 2014-04-30 DIAGNOSIS — M810 Age-related osteoporosis without current pathological fracture: Secondary | ICD-10-CM

## 2014-05-12 ENCOUNTER — Encounter: Payer: Self-pay | Admitting: Nurse Practitioner

## 2014-05-12 ENCOUNTER — Ambulatory Visit (INDEPENDENT_AMBULATORY_CARE_PROVIDER_SITE_OTHER): Payer: Medicare HMO | Admitting: Nurse Practitioner

## 2014-05-12 VITALS — BP 106/63 | HR 86 | Temp 97.8°F | Ht 64.0 in | Wt 172.0 lb

## 2014-05-12 DIAGNOSIS — G629 Polyneuropathy, unspecified: Secondary | ICD-10-CM

## 2014-05-12 DIAGNOSIS — E0789 Other specified disorders of thyroid: Secondary | ICD-10-CM

## 2014-05-12 DIAGNOSIS — E034 Atrophy of thyroid (acquired): Secondary | ICD-10-CM

## 2014-05-12 DIAGNOSIS — E038 Other specified hypothyroidism: Secondary | ICD-10-CM

## 2014-05-12 DIAGNOSIS — G2581 Restless legs syndrome: Secondary | ICD-10-CM

## 2014-05-12 DIAGNOSIS — F329 Major depressive disorder, single episode, unspecified: Secondary | ICD-10-CM

## 2014-05-12 DIAGNOSIS — F32A Depression, unspecified: Secondary | ICD-10-CM

## 2014-05-12 DIAGNOSIS — I10 Essential (primary) hypertension: Secondary | ICD-10-CM

## 2014-05-12 DIAGNOSIS — F411 Generalized anxiety disorder: Secondary | ICD-10-CM

## 2014-05-12 DIAGNOSIS — F3289 Other specified depressive episodes: Secondary | ICD-10-CM

## 2014-05-12 DIAGNOSIS — E039 Hypothyroidism, unspecified: Secondary | ICD-10-CM | POA: Insufficient documentation

## 2014-05-12 DIAGNOSIS — G589 Mononeuropathy, unspecified: Secondary | ICD-10-CM

## 2014-05-12 DIAGNOSIS — E785 Hyperlipidemia, unspecified: Secondary | ICD-10-CM

## 2014-05-12 MED ORDER — TRAMADOL HCL 50 MG PO TABS: ORAL_TABLET | ORAL | Status: DC

## 2014-05-12 MED ORDER — ZOLPIDEM TARTRATE 10 MG PO TABS: ORAL_TABLET | ORAL | Status: DC

## 2014-05-12 MED ORDER — ALPRAZOLAM 0.25 MG PO TABS: ORAL_TABLET | ORAL | Status: DC

## 2014-05-12 NOTE — Patient Instructions (Signed)

## 2014-05-12 NOTE — Progress Notes (Signed)
Subjective:    Patient ID: Phyllis Walker, female    DOB: 05-13-1946, 68 y.o.   MRN: [id number]  Patient here today for follow up of chronic medical problems.  Hypertension This is a chronic problem. The current episode started more than 1 year ago. The problem is unchanged. The problem is controlled. Pertinent negatives include no blurred vision, chest pain, headaches, neck pain, palpitations, peripheral edema or shortness of breath. There are no associated agents to hypertension. Risk factors for coronary artery disease include dyslipidemia and family history. Past treatments include ACE inhibitors and diuretics. The current treatment provides moderate improvement. Compliance problems include diet and exercise.  Hypertensive end-organ damage includes a thyroid problem.  Thyroid Problem Presents for follow-up (hypothyroidism- was told with last labs to decrease levothroxin to 46mg but rx never got called in so she has remained on 767m daily.) visit. Patient reports no anxiety, diarrhea, dry skin, hoarse voice, menstrual problem, palpitations, visual change, weight gain or weight loss. Her past medical history is significant for hyperlipidemia. There is no history of diabetes.  Hyperlipidemia This is a chronic problem. The current episode started more than 1 year ago. The problem is controlled. Recent lipid tests were reviewed and are normal. Exacerbating diseases include hypothyroidism. She has no history of diabetes or obesity. Pertinent negatives include no chest pain or shortness of breath. Current antihyperlipidemic treatment includes statins. The current treatment provides mild improvement of lipids. Compliance problems include adherence to diet.  Risk factors for coronary artery disease include dyslipidemia, family history and post-menopausal.  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 She also takes tramadol on  occasion. Depression/Gad Zoloft and xanax- combination working well. RLS Patient on requip which is working great.     Review of Systems  Constitutional: Negative for weight loss and weight gain.  HENT: Negative for hoarse voice.   Eyes: Negative for blurred vision.  Respiratory: Negative for shortness of breath.   Cardiovascular: Negative for chest pain and palpitations.  Gastrointestinal: Negative for diarrhea.  Genitourinary: Negative for menstrual problem.  Musculoskeletal: Negative for neck pain.  Neurological: Negative for headaches.  All other systems reviewed and are negative.      Objective:   Physical Exam  Constitutional: She is oriented to person, place, and time. She appears well-developed and well-nourished.  HENT:  Nose: Nose normal.  Mouth/Throat: Oropharynx is clear and moist.  Eyes: EOM are normal.  Neck: Trachea normal, normal range of motion and full passive range of motion without pain. Neck supple. No JVD present. Carotid bruit is not present. No thyromegaly present.  Cardiovascular: Normal rate, regular rhythm, normal heart sounds and intact distal pulses.  Exam reveals no gallop and no friction rub.   No murmur heard. Pulmonary/Chest: Effort normal and breath sounds normal.  Abdominal: Soft. Bowel sounds are normal. She exhibits no distension and no mass. There is no tenderness.  Musculoskeletal: Normal range of motion.  Lymphadenopathy:    She has no cervical adenopathy.  Neurological: She is alert and oriented to person, place, and time. She has normal reflexes.  Skin: Skin is warm and dry.  Psychiatric: She has a normal mood and affect. Her behavior is normal. Judgment and thought content normal.   BP 106/63  Pulse 86  Temp(Src) 97.8 F (36.6 C) (Oral)  Ht '5\' 4"'  (1.626 m)  Wt 172 lb (78.019 kg)  BMI 29.51 kg/m2  Assessment & Plan:   1. Hypothyroidism due to acquired atrophy of thyroid   2. Hypertension, benign essential, goal  below 140/90   3. GAD (generalized anxiety disorder)   4. Hyperlipidemia with target LDL less than 100   5. Depression   6. Neuropathy   7. Restless leg syndrome    Orders Placed This Encounter  Procedures  . CMP14+EGFR  . NMR, lipoprofile  . Vit D  25 hydroxy (rtn osteoporosis monitoring)  . Vitamin B12  . Magnesium, RBC   Meds ordered this encounter  Medications  . traMADol (ULTRAM) 50 MG tablet    Sig: TAKE  (1)  TABLET TWICE A DAY AS NEEDED.    Dispense:  60 tablet    Refill:  1    Order Specific Question:  Supervising Provider    Answer:  Chipper Herb [[id number]]  . zolpidem (AMBIEN) 10 MG tablet    Sig: TAKE 1 TABLET AT BEDTIME AS NEEDED FOR SLEEP    Dispense:  30 tablet    Refill:  1    Order Specific Question:  Supervising Provider    Answer:  Chipper Herb [[id number]]  . ALPRAZolam (XANAX) 0.25 MG tablet    Sig: TAKE  (1)  TABLET TWICE A DAY AS NEEDED.    Dispense:  60 tablet    Refill:  1    Order Specific Question:  Supervising Provider    Answer:  Chipper Herb [[id number]]    Labs pending Health maintenance reviewed Diet and exercise encouraged Continue all meds Follow up  In 3 months   [place], FNP

## 2014-05-13 LAB — CMP14+EGFR
ALT: 14 IU/L (ref 0–32)
AST: 19 IU/L (ref 0–40)
Albumin/Globulin Ratio: 1.7 (ref 1.1–2.5)
Albumin: 4.2 g/dL (ref 3.6–4.8)
Alkaline Phosphatase: 98 IU/L (ref 39–117)
BUN/Creatinine Ratio: 20 (ref 11–26)
BUN: 15 mg/dL (ref 8–27)
CALCIUM: 9.2 mg/dL (ref 8.7–10.3)
CHLORIDE: 101 mmol/L (ref 97–108)
CO2: 25 mmol/L (ref 18–29)
Creatinine, Ser: 0.75 mg/dL (ref 0.57–1.00)
GFR calc Af Amer: 95 mL/min/{1.73_m2} (ref >59)
GFR, EST NON AFRICAN AMERICAN: 82 mL/min/{1.73_m2} (ref >59)
Globulin, Total: 2.5 g/dL (ref 1.5–4.5)
Glucose: 90 mg/dL (ref 65–99)
POTASSIUM: 4.2 mmol/L (ref 3.5–5.2)
SODIUM: 143 mmol/L (ref 134–144)
Total Bilirubin: 0.3 mg/dL (ref 0.0–1.2)
Total Protein: 6.7 g/dL (ref 6.0–8.5)

## 2014-05-13 LAB — VITAMIN B12: VITAMIN B 12: 345 pg/mL (ref 211–946)

## 2014-05-13 LAB — SPECIMEN STATUS REPORT

## 2014-05-13 LAB — NMR, LIPOPROFILE
Cholesterol: 165 mg/dL (ref 100–199)
HDL CHOLESTEROL BY NMR: 60 mg/dL (ref >39)
HDL Particle Number: 35.7 umol/L (ref >=30.5)
LDL PARTICLE NUMBER: 1056 nmol/L — AB (ref <1000)
LDL SIZE: 20.7 nm (ref >20.5)
LDLC SERPL CALC-MCNC: 88 mg/dL (ref 0–99)
LP-IR Score: 25 (ref <=45)
SMALL LDL PARTICLE NUMBER: 416 nmol/L (ref <=527)
TRIGLYCERIDES BY NMR: 87 mg/dL (ref 0–149)

## 2014-05-13 LAB — MAGNESIUM, RBC

## 2014-05-13 LAB — VITAMIN D 25 HYDROXY (VIT D DEFICIENCY, FRACTURES): VIT D 25 HYDROXY: 42.8 ng/mL (ref 30.0–100.0)

## 2014-05-13 LAB — MAGNESIUM: Magnesium: 2.1 mg/dL (ref 1.6–2.6)

## 2014-05-22 ENCOUNTER — Encounter: Payer: Self-pay | Admitting: Pharmacist

## 2014-05-22 ENCOUNTER — Ambulatory Visit (INDEPENDENT_AMBULATORY_CARE_PROVIDER_SITE_OTHER): Payer: Medicare HMO | Admitting: Pharmacist

## 2014-05-22 VITALS — Ht 63.5 in | Wt 173.0 lb

## 2014-05-22 DIAGNOSIS — M8080XD Other osteoporosis with current pathological fracture, unspecified site, subsequent encounter for fracture with routine healing: Secondary | ICD-10-CM

## 2014-05-22 DIAGNOSIS — M80079D Age-related osteoporosis with current pathological fracture, unspecified ankle and foot, subsequent encounter for fracture with routine healing: Secondary | ICD-10-CM

## 2014-05-22 DIAGNOSIS — M80079A Age-related osteoporosis with current pathological fracture, unspecified ankle and foot, initial encounter for fracture: Secondary | ICD-10-CM | POA: Insufficient documentation

## 2014-05-22 DIAGNOSIS — M8448XD Pathological fracture, other site, subsequent encounter for fracture with routine healing: Secondary | ICD-10-CM

## 2014-05-22 DIAGNOSIS — M810 Age-related osteoporosis without current pathological fracture: Secondary | ICD-10-CM

## 2014-05-22 NOTE — Progress Notes (Signed)
Osteoporosis Clinic Current Height: Height: 5' 3.5" (161.3 cm)      Max Lifetime Height:  5' 5.5" Current Weight: Weight: 173 lb (78.472 kg)       Ethnicity:Caucasian    HPI:  History of osteoporosis but has never taken pharmacotherapy for osteoporosis (she has refused in past)  Back Pain?  Yes   Kyphosis?  No Prior fracture?  Yes - foot about 10 years ago Med(s) for Osteoporosis/Osteopenia:  none Med(s) previously tried for Osteoporosis/Osteopenia:  none                                                             PMH: Age at menopause:  Mid 82's Hysterectomy?  No Oophorectomy?  No HRT? Yes - Former.  Type/duration: estrogen for only a few months Steroid Use?  No Thyroid med?  Yes History of cancer?  No History of digestive disorders (ie Crohn's)?  No Current or previous eating disorders?  No Last Vitamin D Result:  42.8 (04/2014) Last GFR Result:  82 (04/2014)   FH/SH: Family history of osteoporosis?  Yes - mother Parent with history of hip fracture?  Yes - mother Family history of breast cancer?  No Exercise?  Yes - dancing at least 2-3 days per week Smoking?  No Alcohol?  No    Calcium Assessment Calcium Intake  # of servings/day  Calcium mg  Milk (8 oz) 0  x  300  = 0  Yogurt (4 oz) 2x per week x  200 = 0  Cheese (1 oz) 1 x  200 = 200mg   Other Calcium sources   250mg   Ca supplement 500mg  qd = 500mg    Estimated calcium intake per day 950mg     DEXA Results Date of Test T-Score for AP Spine L1-L4 T-Score for Total Left Hip T-Score for Total Right Hip  04/30/2014 -2.7 -1.9 -2.1  06/09/2010 -3.1 -1.9 -2.0  05/28/2008 -3.0 -1.4 -1.5  04/30/2002 -2.2 -0.9 --   Assessment: Osteoporosis with stable BMD   Recommendations: 1.  Discussed fracture risk - patient continues to refuse pharmacotherapy for osteoporosis 2.  recommend calcium 1200mg  daily through supplementation or diet.  3.  continue weight bearing exercise - 30 minutes at least 4 days per week.   4.   Counseled and educated about fall risk and prevention.  Recheck DEXA:  2 years  Time spent counseling patient:  20 minutes  Phyllis Walker, PharmD, CPP

## 2014-06-09 ENCOUNTER — Ambulatory Visit (INDEPENDENT_AMBULATORY_CARE_PROVIDER_SITE_OTHER): Payer: Medicare HMO

## 2014-06-09 DIAGNOSIS — Z23 Encounter for immunization: Secondary | ICD-10-CM

## 2014-06-30 ENCOUNTER — Other Ambulatory Visit: Payer: Self-pay | Admitting: Nurse Practitioner

## 2014-07-14 ENCOUNTER — Encounter: Payer: Self-pay | Admitting: Pharmacist

## 2014-07-31 ENCOUNTER — Other Ambulatory Visit: Payer: Self-pay | Admitting: Nurse Practitioner

## 2014-08-01 NOTE — Telephone Encounter (Signed)
All was filled 06/30/14, last seen 05/12/14. All will print

## 2014-08-01 NOTE — Telephone Encounter (Signed)
rx ready for pickup 

## 2014-08-01 NOTE — Telephone Encounter (Signed)
Patient aware rx ready to be picked up 

## 2014-08-20 ENCOUNTER — Encounter: Payer: Self-pay | Admitting: Nurse Practitioner

## 2014-08-20 ENCOUNTER — Ambulatory Visit (INDEPENDENT_AMBULATORY_CARE_PROVIDER_SITE_OTHER): Payer: Medicare HMO | Admitting: Nurse Practitioner

## 2014-08-20 VITALS — BP 125/74 | HR 72 | Temp 97.5°F | Ht 63.0 in | Wt 175.0 lb

## 2014-08-20 DIAGNOSIS — E039 Hypothyroidism, unspecified: Secondary | ICD-10-CM

## 2014-08-20 DIAGNOSIS — E038 Other specified hypothyroidism: Secondary | ICD-10-CM

## 2014-08-20 DIAGNOSIS — F32A Depression, unspecified: Secondary | ICD-10-CM

## 2014-08-20 DIAGNOSIS — E034 Atrophy of thyroid (acquired): Secondary | ICD-10-CM

## 2014-08-20 DIAGNOSIS — E785 Hyperlipidemia, unspecified: Secondary | ICD-10-CM

## 2014-08-20 DIAGNOSIS — F411 Generalized anxiety disorder: Secondary | ICD-10-CM

## 2014-08-20 DIAGNOSIS — F329 Major depressive disorder, single episode, unspecified: Secondary | ICD-10-CM

## 2014-08-20 DIAGNOSIS — G2581 Restless legs syndrome: Secondary | ICD-10-CM

## 2014-08-20 DIAGNOSIS — G629 Polyneuropathy, unspecified: Secondary | ICD-10-CM

## 2014-08-20 DIAGNOSIS — I10 Essential (primary) hypertension: Secondary | ICD-10-CM

## 2014-08-20 DIAGNOSIS — G4700 Insomnia, unspecified: Secondary | ICD-10-CM

## 2014-08-20 MED ORDER — ZOLPIDEM TARTRATE 10 MG PO TABS: 10.0000 mg | ORAL_TABLET | Freq: Every evening | ORAL | Status: DC | PRN

## 2014-08-20 MED ORDER — ALPRAZOLAM 0.25 MG PO TABS: ORAL_TABLET | ORAL | Status: DC

## 2014-08-20 MED ORDER — LEVOTHYROXINE SODIUM 75 MCG PO TABS
75.0000 ug | ORAL_TABLET | Freq: Every day | ORAL | Status: DC
Start: 2014-08-20 — End: 2015-03-09

## 2014-08-20 NOTE — Progress Notes (Signed)
Subjective:    Patient ID: Phyllis Walker, female    DOB: Nov 04, 1945, 68 y.o.   MRN: [id number]  Patient here today for follow up of chronic medical problems. No complaints today.  Hypertension This is a chronic problem. The current episode started more than 1 year ago. The problem is controlled. Pertinent negatives include no chest pain, headaches, neck pain, palpitations or shortness of breath. Risk factors for coronary artery disease include dyslipidemia and post-menopausal state. Past treatments include diuretics and ACE inhibitors. The current treatment provides moderate improvement. There are no compliance problems.  Hypertensive end-organ damage includes a thyroid problem.  Thyroid Problem Presents for follow-up visit. Patient reports no diarrhea, menstrual problem, palpitations or visual change. The symptoms have been stable. Her past medical history is significant for hyperlipidemia. There is no history of diabetes.  Hyperlipidemia This is a chronic problem. The current episode started more than 1 year ago. The problem is controlled. Recent lipid tests were reviewed and are normal. Exacerbating diseases include hypothyroidism. She has no history of diabetes. Pertinent negatives include no chest pain or shortness of breath. She is currently on no antihyperlipidemic treatment. Compliance problems include adherence to diet.  Risk factors for coronary artery disease include dyslipidemia, hypertension and post-menopausal.  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 She also takes tramadol on occasion. Depression/Gad Zoloft and xanax- combination working well. RLS Patient on requip which is working great.     Review of Systems  Constitutional: Negative.   HENT: Negative.   Respiratory: Negative for shortness of breath.   Cardiovascular: Negative for chest pain and palpitations.  Gastrointestinal: Negative for  diarrhea.  Genitourinary: Negative for menstrual problem.  Musculoskeletal: Negative for neck pain.  Neurological: Negative for headaches.  All other systems reviewed and are negative.      Objective:   Physical Exam  Constitutional: She is oriented to person, place, and time. She appears well-developed and well-nourished.  HENT:  Nose: Nose normal.  Mouth/Throat: Oropharynx is clear and moist.  Eyes: EOM are normal.  Neck: Trachea normal, normal range of motion and full passive range of motion without pain. Neck supple. No JVD present. Carotid bruit is not present. No thyromegaly present.  Cardiovascular: Normal rate, regular rhythm, normal heart sounds and intact distal pulses.  Exam reveals no gallop and no friction rub.   No murmur heard. Pulmonary/Chest: Effort normal and breath sounds normal.  Abdominal: Soft. Bowel sounds are normal. She exhibits no distension and no mass. There is no tenderness.  Musculoskeletal: Normal range of motion.  Lymphadenopathy:    She has no cervical adenopathy.  Neurological: She is alert and oriented to person, place, and time. She has normal reflexes.  Skin: Skin is warm and dry.  Psychiatric: She has a normal mood and affect. Her behavior is normal. Judgment and thought content normal.   BP 125/74 mmHg  Pulse 72  Temp(Src) 97.5 F (36.4 C) (Oral)  Ht '5\' 3"'  (1.6 m)  Wt 175 lb (79.379 kg)  BMI 31.01 kg/m2        Assessment & Plan:   1. Hypertension, benign essential, goal below 140/90 Avoid salt in diet - CMP14+EGFR  2. Hypothyroidism due to acquired atrophy of thyroid  3. Neuropathy  4. GAD (generalized anxiety disorder) Stress management - ALPRAZolam (XANAX) 0.25 MG tablet; TAKE  (1)  TABLET TWICE A DAY AS NEEDED.  Dispense: 60 tablet; Refill: 1  5. Depression Stress manangement  6. Restless leg syndrome Keep legs warm at night  7. Hyperlipidemia with target LDL less than 100 Low fat diet - NMR, lipoprofile  8.  Hypothyroidism, unspecified hypothyroidism type - levothyroxine (SYNTHROID, LEVOTHROID) 75 MCG tablet; Take 1 tablet (75 mcg total) by mouth daily.  Dispense: 90 tablet; Refill: 2 - Thyroid Panel With TSH  9. Insomnia Bedtime ritual - zolpidem (AMBIEN) 10 MG tablet; Take 1 tablet (10 mg total) by mouth at bedtime as needed. for sleep  Dispense: 30 tablet; Refill: 1    Labs pending Health maintenance reviewed Diet and exercise encouraged Continue all meds Follow up  In 3 months   [place], FNP

## 2014-08-20 NOTE — Patient Instructions (Signed)

## 2014-08-21 LAB — CMP14+EGFR
A/G RATIO: 1.7 (ref 1.1–2.5)
ALBUMIN: 4.5 g/dL (ref 3.6–4.8)
ALT: 12 IU/L (ref 0–32)
AST: 17 IU/L (ref 0–40)
Alkaline Phosphatase: 104 IU/L (ref 39–117)
BUN/Creatinine Ratio: 23 (ref 11–26)
BUN: 18 mg/dL (ref 8–27)
CALCIUM: 9.4 mg/dL (ref 8.7–10.3)
CO2: 22 mmol/L (ref 18–29)
CREATININE: 0.78 mg/dL (ref 0.57–1.00)
Chloride: 101 mmol/L (ref 97–108)
GFR, EST AFRICAN AMERICAN: 90 mL/min/{1.73_m2} (ref >59)
GFR, EST NON AFRICAN AMERICAN: 78 mL/min/{1.73_m2} (ref >59)
GLOBULIN, TOTAL: 2.6 g/dL (ref 1.5–4.5)
GLUCOSE: 81 mg/dL (ref 65–99)
Potassium: 4.1 mmol/L (ref 3.5–5.2)
Sodium: 143 mmol/L (ref 134–144)
TOTAL PROTEIN: 7.1 g/dL (ref 6.0–8.5)
Total Bilirubin: 0.3 mg/dL (ref 0.0–1.2)

## 2014-08-21 LAB — THYROID PANEL WITH TSH
Free Thyroxine Index: 2.6 (ref 1.2–4.9)
T3 Uptake Ratio: 25 % (ref 24–39)
T4 TOTAL: 10.3 ug/dL (ref 4.5–12.0)
TSH: 0.362 u[IU]/mL — ABNORMAL LOW (ref 0.450–4.500)

## 2014-08-21 LAB — NMR, LIPOPROFILE
Cholesterol: 174 mg/dL (ref 100–199)
HDL Cholesterol by NMR: 84 mg/dL (ref >39)
HDL PARTICLE NUMBER: 37.3 umol/L (ref >=30.5)
LDL Particle Number: 758 nmol/L (ref <1000)
LDL Size: 21 nm (ref >20.5)
LDL-C: 79 mg/dL (ref 0–99)
LP-IR Score: <25 (ref <=45)
TRIGLYCERIDES BY NMR: 54 mg/dL (ref 0–149)

## 2014-08-28 ENCOUNTER — Other Ambulatory Visit: Payer: Self-pay | Admitting: Nurse Practitioner

## 2014-08-28 NOTE — Telephone Encounter (Signed)
Last seen 08/20/14  MMM If approved print and route to nurse

## 2014-08-28 NOTE — Telephone Encounter (Signed)
Please call in xanax and ambien with 1 refills each

## 2014-08-29 NOTE — Telephone Encounter (Signed)
Phoned in xanax

## 2014-09-29 ENCOUNTER — Other Ambulatory Visit: Payer: Self-pay | Admitting: Nurse Practitioner

## 2014-10-10 ENCOUNTER — Telehealth: Payer: Self-pay | Admitting: Nurse Practitioner

## 2014-10-10 MED ORDER — ZOLPIDEM TARTRATE 10 MG PO TABS: 10.0000 mg | ORAL_TABLET | Freq: Every evening | ORAL | Status: DC | PRN

## 2014-10-10 NOTE — Telephone Encounter (Signed)
Phoned into pharmacy, pt aware.

## 2014-10-10 NOTE — Telephone Encounter (Signed)
ambien 10mg  1 po qhs #30 3 refills

## 2014-11-10 ENCOUNTER — Encounter: Payer: Self-pay | Admitting: Nurse Practitioner

## 2014-11-10 ENCOUNTER — Ambulatory Visit (INDEPENDENT_AMBULATORY_CARE_PROVIDER_SITE_OTHER): Payer: PPO | Admitting: Nurse Practitioner

## 2014-11-10 VITALS — BP 125/74 | HR 84 | Temp 97.3°F | Ht 63.0 in | Wt 181.0 lb

## 2014-11-10 DIAGNOSIS — J029 Acute pharyngitis, unspecified: Secondary | ICD-10-CM

## 2014-11-10 MED ORDER — DOXYCYCLINE HYCLATE 100 MG PO TABS: 100.0000 mg | ORAL_TABLET | Freq: Two times a day (BID) | ORAL | Status: DC

## 2014-11-10 NOTE — Progress Notes (Signed)
  Subjective:     Phyllis Walker is a 69 y.o. female who presents for evaluation of sore throat. Associated symptoms include chills, hoarseness, nasal blockage, yellow nasal discharge, pain while swallowing, sore throat and swollen glands. Onset of symptoms was 4 days ago, and have been gradually worsening since that time. She is drinking plenty of fluids. She has not had a recent close exposure to someone with proven streptococcal pharyngitis.     Review of Systems Constitutional: negative except for chills Ears, nose, mouth, throat, and face: negative except for earaches, hoarseness, nasal congestion and sore throat Respiratory: negative except for sputum Cardiovascular: negative    Objective:    General appearance: alert, cooperative, appears stated age and no distress Head: Normocephalic, without obvious abnormality Eyes: Conjunctiva and corneas clear and intact bilaterally, no discharge.  Ears: normal TM's and external ear canals both ears Nose: Nares normal. Septum midline. Mucosa normal. No drainage or sinus tenderness., mild congestion, deviated septum Throat: normal findings: buccal mucosa normal, palate normal, tongue midline and normal and tonsils mildly injected +1 bilaterally.  Neck: mild anterior cervical adenopathy and supple, symmetrical, trachea midline Lungs: clear to auscultation bilaterally Heart: regular rate and rhythm, S1, S2 normal, no murmur, click, rub or gallop  Laboratory Strep test not done.   Assessment:    Acute pharyngitis .   Plan:      Force fluids Motrin or tylenol OTC OTC decongestant Throat lozenges if help New toothbrush in 3 days Meds ordered this encounter  Medications  . doxycycline (VIBRA-TABS) 100 MG tablet    Sig: Take 1 tablet (100 mg total) by mouth 2 (two) times daily. 1 po bid    Dispense:  20 tablet    Refill:  0    Order Specific Question:  Supervising Provider    Answer:  Chipper Herb [[id number]]   Mary-Margaret Hassell Done,  FNP

## 2014-11-10 NOTE — Patient Instructions (Signed)

## 2014-11-20 ENCOUNTER — Encounter: Payer: Self-pay | Admitting: Nurse Practitioner

## 2014-11-20 ENCOUNTER — Ambulatory Visit (INDEPENDENT_AMBULATORY_CARE_PROVIDER_SITE_OTHER): Payer: PPO | Admitting: Nurse Practitioner

## 2014-11-20 VITALS — BP 135/73 | HR 82 | Temp 97.4°F | Ht 63.0 in | Wt 183.0 lb

## 2014-11-20 DIAGNOSIS — Z23 Encounter for immunization: Secondary | ICD-10-CM

## 2014-11-20 DIAGNOSIS — E785 Hyperlipidemia, unspecified: Secondary | ICD-10-CM

## 2014-11-20 DIAGNOSIS — E034 Atrophy of thyroid (acquired): Secondary | ICD-10-CM | POA: Diagnosis not present

## 2014-11-20 DIAGNOSIS — G629 Polyneuropathy, unspecified: Secondary | ICD-10-CM

## 2014-11-20 DIAGNOSIS — I10 Essential (primary) hypertension: Secondary | ICD-10-CM

## 2014-11-20 DIAGNOSIS — F411 Generalized anxiety disorder: Secondary | ICD-10-CM

## 2014-11-20 DIAGNOSIS — F32A Depression, unspecified: Secondary | ICD-10-CM

## 2014-11-20 DIAGNOSIS — E038 Other specified hypothyroidism: Secondary | ICD-10-CM

## 2014-11-20 DIAGNOSIS — G2581 Restless legs syndrome: Secondary | ICD-10-CM

## 2014-11-20 DIAGNOSIS — F329 Major depressive disorder, single episode, unspecified: Secondary | ICD-10-CM

## 2014-11-20 MED ORDER — SERTRALINE HCL 50 MG PO TABS: ORAL_TABLET | ORAL | Status: DC

## 2014-11-20 MED ORDER — TRAMADOL HCL 50 MG PO TABS: ORAL_TABLET | ORAL | Status: DC

## 2014-11-20 MED ORDER — ROPINIROLE HCL 0.5 MG PO TABS: 0.5000 mg | ORAL_TABLET | Freq: Every day | ORAL | Status: DC

## 2014-11-20 MED ORDER — BENAZEPRIL-HYDROCHLOROTHIAZIDE 20-12.5 MG PO TABS: 1.0000 | ORAL_TABLET | Freq: Every day | ORAL | Status: DC

## 2014-11-20 MED ORDER — SIMVASTATIN 40 MG PO TABS: ORAL_TABLET | ORAL | Status: DC

## 2014-11-20 MED ORDER — ALPRAZOLAM 0.25 MG PO TABS: ORAL_TABLET | ORAL | Status: DC

## 2014-11-20 NOTE — Progress Notes (Signed)
Subjective:    Patient ID: Phyllis Walker, female    DOB: [birth year], 69 y.o.   MRN: [id number]  Patient here today for follow up of chronic medical problems. No complaints today.  Hypertension This is a chronic problem. The current episode started more than 1 year ago. The problem is controlled. Pertinent negatives include no chest pain, headaches, neck pain, palpitations or shortness of breath. Risk factors for coronary artery disease include dyslipidemia and post-menopausal state. Past treatments include diuretics and ACE inhibitors. The current treatment provides moderate improvement. There are no compliance problems.  Hypertensive end-organ damage includes a thyroid problem.  Thyroid Problem Presents for follow-up visit. Patient reports no diarrhea, menstrual problem, palpitations or visual change. The symptoms have been stable. Her past medical history is significant for hyperlipidemia. There is no history of diabetes.  Hyperlipidemia This is a chronic problem. The current episode started more than 1 year ago. The problem is controlled. Recent lipid tests were reviewed and are normal. Exacerbating diseases include hypothyroidism. She has no history of diabetes. Pertinent negatives include no chest pain or shortness of breath. She is currently on no antihyperlipidemic treatment. Compliance problems include adherence to diet.  Risk factors for coronary artery disease include dyslipidemia, hypertension and post-menopausal.  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 She also takes tramadol on occasion. Depression/Gad Zoloft and xanax- combination working well. RLS Patient on requip which is working great.     Review of Systems  Constitutional: Negative.   HENT: Negative.   Respiratory: Negative for shortness of breath.   Cardiovascular: Negative for chest pain and palpitations.  Gastrointestinal: Negative for  diarrhea.  Genitourinary: Negative for menstrual problem.  Musculoskeletal: Negative for neck pain.  Neurological: Negative for headaches.  All other systems reviewed and are negative.      Objective:   Physical Exam  Constitutional: She is oriented to person, place, and time. She appears well-developed and well-nourished.  HENT:  Nose: Nose normal.  Mouth/Throat: Oropharynx is clear and moist.  Eyes: EOM are normal.  Neck: Trachea normal, normal range of motion and full passive range of motion without pain. Neck supple. No JVD present. Carotid bruit is not present. No thyromegaly present.  Cardiovascular: Normal rate, regular rhythm, normal heart sounds and intact distal pulses.  Exam reveals no gallop and no friction rub.   No murmur heard. Pulmonary/Chest: Effort normal and breath sounds normal.  Abdominal: Soft. Bowel sounds are normal. She exhibits no distension and no mass. There is no tenderness.  Musculoskeletal: Normal range of motion.  Lymphadenopathy:    She has no cervical adenopathy.  Neurological: She is alert and oriented to person, place, and time. She has normal reflexes.  Skin: Skin is warm and dry.  Psychiatric: She has a normal mood and affect. Her behavior is normal. Judgment and thought content normal.   BP 135/73 mmHg  Pulse 82  Temp(Src) 97.4 F (36.3 C) (Oral)  Ht '5\' 3"'  (1.6 m)  Wt 183 lb (83.008 kg)  BMI 32.43 kg/m2        Assessment & Plan:   1. Hypertension, benign essential, goal below 140/90 Do not add slat to diet - benazepril-hydrochlorthiazide (LOTENSIN HCT) 20-12.5 MG per tablet; Take 1 tablet by mouth daily.  Dispense: 90 tablet; Refill: 1 - CMP14+EGFR  2. Hypothyroidism due to acquired atrophy of thyroid - Thyroid Panel With TSH  3. Neuropathy - traMADol (ULTRAM) 50  MG tablet; TAKE  (1)  TABLET TWICE A DAY AS NEEDED.  Dispense: 60 tablet; Refill: 0  4. Restless leg syndrome - rOPINIRole (REQUIP) 0.5 MG tablet; Take 1 tablet (0.5  mg total) by mouth at bedtime.  Dispense: 30 tablet; Refill: 2  5. Hyperlipidemia with target LDL less than 100 Low fat diet - simvastatin (ZOCOR) 40 MG tablet; Take 1 tablet by mouth at  bedtime  Dispense: 90 tablet; Refill: 1 - NMR, lipoprofile  6. GAD (generalized anxiety disorder) Stress management - ALPRAZolam (XANAX) 0.25 MG tablet; TAKE  (1)  TABLET TWICE A DAY AS NEEDED.  Dispense: 60 tablet; Refill: 1  7. Depression  - sertraline (ZOLOFT) 50 MG tablet; Take 1 tablet by mouth  daily  Dispense: 90 tablet; Refill: 1   prevnar 23 today Labs pending Health maintenance reviewed Diet and exercise encouraged Continue all meds Follow up  In 3 month   [place], FNP

## 2014-11-20 NOTE — Addendum Note (Signed)
Addended by: Rolena Infante on: 11/20/2014 12:31 PM   Modules accepted: Orders

## 2014-11-21 LAB — CMP14+EGFR
A/G RATIO: 2 (ref 1.1–2.5)
ALK PHOS: 99 IU/L (ref 39–117)
ALT: 12 IU/L (ref 0–32)
AST: 20 IU/L (ref 0–40)
Albumin: 4.3 g/dL (ref 3.6–4.8)
BUN / CREAT RATIO: 13 (ref 11–26)
BUN: 12 mg/dL (ref 8–27)
Bilirubin Total: 0.3 mg/dL (ref 0.0–1.2)
CO2: 27 mmol/L (ref 18–29)
CREATININE: 0.92 mg/dL (ref 0.57–1.00)
Calcium: 9.1 mg/dL (ref 8.7–10.3)
Chloride: 99 mmol/L (ref 97–108)
GFR calc Af Amer: 73 mL/min/{1.73_m2} (ref >59)
GFR, EST NON AFRICAN AMERICAN: 64 mL/min/{1.73_m2} (ref >59)
Globulin, Total: 2.1 g/dL (ref 1.5–4.5)
Glucose: 68 mg/dL (ref 65–99)
Potassium: 4.4 mmol/L (ref 3.5–5.2)
SODIUM: 142 mmol/L (ref 134–144)
Total Protein: 6.4 g/dL (ref 6.0–8.5)

## 2014-11-21 LAB — THYROID PANEL WITH TSH
Free Thyroxine Index: 2.6 (ref 1.2–4.9)
T3 Uptake Ratio: 26 % (ref 24–39)
T4, Total: 10 ug/dL (ref 4.5–12.0)
TSH: 0.89 u[IU]/mL (ref 0.450–4.500)

## 2014-11-21 LAB — NMR, LIPOPROFILE
CHOLESTEROL: 152 mg/dL (ref 100–199)
HDL Cholesterol by NMR: 57 mg/dL (ref >39)
HDL PARTICLE NUMBER: 36.6 umol/L (ref >=30.5)
LDL Particle Number: 1124 nmol/L — ABNORMAL HIGH (ref <1000)
LDL Size: 21.1 nm (ref >20.5)
LDL-C: 76 mg/dL (ref 0–99)
Small LDL Particle Number: 398 nmol/L (ref <=527)
TRIGLYCERIDES BY NMR: 97 mg/dL (ref 0–149)

## 2014-12-01 ENCOUNTER — Other Ambulatory Visit: Payer: Self-pay | Admitting: Nurse Practitioner

## 2015-01-26 ENCOUNTER — Other Ambulatory Visit: Payer: Self-pay | Admitting: Nurse Practitioner

## 2015-01-26 NOTE — Telephone Encounter (Signed)
Last seen 11/20/14 MMM If approved print

## 2015-01-26 NOTE — Telephone Encounter (Signed)
Patient aware rx is ready to be picked up 

## 2015-01-26 NOTE — Telephone Encounter (Signed)
Ultram rx ready for pick up  

## 2015-02-27 ENCOUNTER — Other Ambulatory Visit: Payer: Self-pay | Admitting: Nurse Practitioner

## 2015-02-27 NOTE — Telephone Encounter (Signed)
Last seen 11/20/14 MMM If approved print

## 2015-03-09 ENCOUNTER — Encounter: Payer: Self-pay | Admitting: Nurse Practitioner

## 2015-03-09 ENCOUNTER — Ambulatory Visit (INDEPENDENT_AMBULATORY_CARE_PROVIDER_SITE_OTHER): Payer: PPO | Admitting: Nurse Practitioner

## 2015-03-09 VITALS — BP 125/73 | HR 86 | Temp 97.1°F | Ht 63.0 in | Wt 182.6 lb

## 2015-03-09 DIAGNOSIS — G2581 Restless legs syndrome: Secondary | ICD-10-CM

## 2015-03-09 DIAGNOSIS — R5383 Other fatigue: Secondary | ICD-10-CM | POA: Diagnosis not present

## 2015-03-09 DIAGNOSIS — F411 Generalized anxiety disorder: Secondary | ICD-10-CM

## 2015-03-09 DIAGNOSIS — E785 Hyperlipidemia, unspecified: Secondary | ICD-10-CM

## 2015-03-09 DIAGNOSIS — G629 Polyneuropathy, unspecified: Secondary | ICD-10-CM | POA: Diagnosis not present

## 2015-03-09 DIAGNOSIS — I10 Essential (primary) hypertension: Secondary | ICD-10-CM | POA: Diagnosis not present

## 2015-03-09 DIAGNOSIS — F329 Major depressive disorder, single episode, unspecified: Secondary | ICD-10-CM

## 2015-03-09 DIAGNOSIS — F32A Depression, unspecified: Secondary | ICD-10-CM

## 2015-03-09 DIAGNOSIS — E034 Atrophy of thyroid (acquired): Secondary | ICD-10-CM

## 2015-03-09 DIAGNOSIS — E038 Other specified hypothyroidism: Secondary | ICD-10-CM | POA: Diagnosis not present

## 2015-03-09 DIAGNOSIS — G4700 Insomnia, unspecified: Secondary | ICD-10-CM | POA: Diagnosis not present

## 2015-03-09 DIAGNOSIS — E039 Hypothyroidism, unspecified: Secondary | ICD-10-CM | POA: Diagnosis not present

## 2015-03-09 MED ORDER — ROPINIROLE HCL 0.5 MG PO TABS: 0.5000 mg | ORAL_TABLET | Freq: Every day | ORAL | Status: DC

## 2015-03-09 MED ORDER — LEVOTHYROXINE SODIUM 75 MCG PO TABS: 75.0000 ug | ORAL_TABLET | Freq: Every day | ORAL | Status: DC

## 2015-03-09 MED ORDER — ZOLPIDEM TARTRATE 10 MG PO TABS: 10.0000 mg | ORAL_TABLET | Freq: Every evening | ORAL | Status: DC | PRN

## 2015-03-09 MED ORDER — BENAZEPRIL-HYDROCHLOROTHIAZIDE 20-12.5 MG PO TABS: 1.0000 | ORAL_TABLET | Freq: Every day | ORAL | Status: DC

## 2015-03-09 MED ORDER — GABAPENTIN 100 MG PO CAPS: ORAL_CAPSULE | ORAL | Status: DC

## 2015-03-09 MED ORDER — SIMVASTATIN 40 MG PO TABS: ORAL_TABLET | ORAL | Status: DC

## 2015-03-09 MED ORDER — SERTRALINE HCL 50 MG PO TABS: ORAL_TABLET | ORAL | Status: DC

## 2015-03-09 MED ORDER — ALPRAZOLAM 0.25 MG PO TABS: ORAL_TABLET | ORAL | Status: DC

## 2015-03-09 NOTE — Progress Notes (Signed)
Subjective:    Patient ID: Phyllis Walker, female    DOB: 02-14-1946, 69 y.o.   MRN: [id number]  Patient here today for follow up of chronic medical problems. No complaints today.  Hyperlipidemia This is a chronic problem. The current episode started more than 1 year ago. The problem is controlled. Recent lipid tests were reviewed and are normal. Exacerbating diseases include hypothyroidism. She has no history of diabetes. Pertinent negatives include no chest pain or shortness of breath. She is currently on no antihyperlipidemic treatment. Compliance problems include adherence to diet.  Risk factors for coronary artery disease include dyslipidemia, hypertension and post-menopausal.  Hypertension This is a chronic problem. The current episode started more than 1 year ago. The problem is controlled. Pertinent negatives include no chest pain, headaches, neck pain, palpitations or shortness of breath. Risk factors for coronary artery disease include dyslipidemia and post-menopausal state. Past treatments include diuretics and ACE inhibitors. The current treatment provides moderate improvement. There are no compliance problems.  Hypertensive end-organ damage includes a thyroid problem.  Thyroid Problem Presents for follow-up visit. Patient reports no diarrhea, menstrual problem, palpitations or visual change. The symptoms have been stable. Her past medical history is significant for hyperlipidemia. There is no history of diabetes.  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 She also takes tramadol on occasion. Depression/Gad Zoloft and xanax- combination working well. RLS Patient on requip which is working great.     Review of Systems  Constitutional: Negative.   HENT: Negative.   Respiratory: Negative for shortness of breath.   Cardiovascular: Negative for chest pain and palpitations.  Gastrointestinal: Negative for  diarrhea.  Genitourinary: Negative for menstrual problem.  Musculoskeletal: Negative for neck pain.  Neurological: Negative for headaches.  All other systems reviewed and are negative.      Objective:   Physical Exam  Constitutional: She is oriented to person, place, and time. She appears well-developed and well-nourished.  HENT:  Nose: Nose normal.  Mouth/Throat: Oropharynx is clear and moist.  Eyes: EOM are normal.  Neck: Trachea normal, normal range of motion and full passive range of motion without pain. Neck supple. No JVD present. Carotid bruit is not present. No thyromegaly present.  Cardiovascular: Normal rate, regular rhythm, normal heart sounds and intact distal pulses.  Exam reveals no gallop and no friction rub.   No murmur heard. Pulmonary/Chest: Effort normal and breath sounds normal.  Abdominal: Soft. Bowel sounds are normal. She exhibits no distension and no mass. There is no tenderness.  Musculoskeletal: Normal range of motion.  Lymphadenopathy:    She has no cervical adenopathy.  Neurological: She is alert and oriented to person, place, and time. She has normal reflexes.  Skin: Skin is warm and dry.  Psychiatric: She has a normal mood and affect. Her behavior is normal. Judgment and thought content normal.   BP 125/73 mmHg  Pulse 86  Temp(Src) 97.1 F (36.2 C) (Oral)  Ht '5\' 3"'  (1.6 m)  Wt 182 lb 9.6 oz (82.827 kg)  BMI 32.35 kg/m2        Assessment & Plan:   1. Hypertension, benign essential, goal below 140/90 Do not add salt to diet - benazepril-hydrochlorthiazide (LOTENSIN HCT) 20-12.5 MG per tablet; Take 1 tablet by mouth daily.  Dispense: 90 tablet; Refill: 1 - CMP14+EGFR  2. Hypothyroidism due to acquired atrophy of thyroid - Thyroid Panel With TSH  3. Neuropathy - gabapentin (  NEURONTIN) 100 MG capsule; TAKE (1) CAPSULE THREE TIMES DAILY.  Dispense: 90 capsule; Refill: 5  4. Restless leg syndrome - rOPINIRole (REQUIP) 0.5 MG tablet; Take 1  tablet (0.5 mg total) by mouth at bedtime.  Dispense: 90 tablet; Refill: 1  5. Hyperlipidemia with target LDL less than 100 Low fta diet - simvastatin (ZOCOR) 40 MG tablet; Take 1 tablet by mouth at  bedtime  Dispense: 90 tablet; Refill: 1 - Lipid panel  6. GAD (generalized anxiety disorder) Stress management - ALPRAZolam (XANAX) 0.25 MG tablet; TAKE  (1)  TABLET TWICE A DAY AS NEEDED.  Dispense: 60 tablet; Refill: 1  7. Depression - sertraline (ZOLOFT) 50 MG tablet; Take 1 tablet by mouth  daily  Dispense: 90 tablet; Refill: 1  8. Hypothyroidism, unspecified hypothyroidism type - levothyroxine (SYNTHROID, LEVOTHROID) 75 MCG tablet; Take 1 tablet (75 mcg total) by mouth daily.  Dispense: 90 tablet; Refill: 2  9. Insomnia bedtime ritual - zolpidem (AMBIEN) 10 MG tablet; Take 1 tablet (10 mg total) by mouth at bedtime as needed. for sleep  Dispense: 30 tablet; Refill: 2  10. Other fatigue - Anemia Profile B    Labs pending Health maintenance reviewed Diet and exercise encouraged Continue all meds Follow up  In 3 month   [place], FNP

## 2015-03-09 NOTE — Patient Instructions (Signed)
Bone Health Our bones do many things. They provide structure, protect organs, anchor muscles, and store calcium. Adequate calcium in your diet and weight-bearing physical activity help build strong bones, improve bone amounts, and may reduce the risk of weakening of bones (osteoporosis) later in life. PEAK BONE MASS By age 70, the average woman has acquired most of her skeletal bone mass. A large decline occurs in older adults which increases the risk of osteoporosis. In women this occurs around the time of menopause. It is important for young girls to reach their peak bone mass in order to maintain bone health throughout life. A person with high bone mass as a young adult will be more likely to have a higher bone mass later in life. Not enough calcium consumption and physical activity early on could result in a failure to achieve optimum bone mass in adulthood. OSTEOPOROSIS Osteoporosis is a disease of the bones. It is defined as low bone mass with deterioration of bone structure. Osteoporosis leads to an increase risk of fractures with falls. These fractures commonly happen in the wrist, hip, and spine. While men and women of all ages and background can develop osteoporosis, some of the risk factors for osteoporosis are:  Female.  White.  Postmenopausal.  Older adults.  Small in body size.  Eating a diet low in calcium.  Physically inactive.  Smoking.  Use of some medications.  Family history. CALCIUM Calcium is a mineral needed by the body for healthy bones, teeth, and proper function of the heart, muscles, and nerves. The body cannot produce calcium so it must be absorbed through food. Good sources of calcium include:  Dairy products (low fat or nonfat milk, cheese, and yogurt).  Dark green leafy vegetables (bok choy and broccoli).  Calcium fortified foods (orange juice, cereal, bread, soy beverages, and tofu products).  Nuts (almonds). Recommended amounts of calcium vary  for individuals. RECOMMENDED CALCIUM INTAKES Age and Amount in mg per day  Children 1 to 3 years / 700 mg  Children 4 to 8 years / 1,000 mg  Children 9 to 13 years / 1,300 mg  Teens 14 to 18 years / 1,300 mg  Adults 19 to 50 years / 1,000 mg  Adult women 51 to 70 years / 1,200 mg  Adults 71 years and older / 1,200 mg  Pregnant and breastfeeding teens / 1,300 mg  Pregnant and breastfeeding adults / 1,000 mg Vitamin D also plays an important role in healthy bone development. Vitamin D helps in the absorption of calcium. WEIGHT-BEARING PHYSICAL ACTIVITY Regular physical activity has many positive health benefits. Benefits include strong bones. Weight-bearing physical activity early in life is important in reaching peak bone mass. Weight-bearing physical activities cause muscles and bones to work against gravity. Some examples of weight bearing physical activities include:  Walking, jogging, or running.  Field Hockey.  Jumping rope.  Dancing.  Soccer.  Tennis or Racquetball.  Stair climbing.  Basketball.  Hiking.  Weight lifting.  Aerobic fitness classes. Including weight-bearing physical activity into an exercise plan is a great way to keep bones healthy. Adults: Engage in at least 30 minutes of moderate physical activity on most, preferably all, days of the week. Children: Engage in at least 60 minutes of moderate physical activity on most, preferably all, days of the week. FOR MORE INFORMATION [place] Department of Agriculture, Center for Nutrition Policy and Promotion: www.cnpp.usda.gov National Osteoporosis Foundation: www.nof.org Document Released: 11/19/2003 Document Revised: 12/24/2012 Document Reviewed: 02/18/2009 ExitCare Patient Information   2015 [employer]. This information is not intended to replace advice given to you by your health care provider. Make sure you discuss any questions you have with your health care provider.  

## 2015-03-09 NOTE — Addendum Note (Signed)
Addended by: Chevis Pretty on: 03/09/2015 10:40 AM   Modules accepted: Orders

## 2015-03-10 LAB — CMP14+EGFR
A/G RATIO: 1.9 (ref 1.1–2.5)
ALBUMIN: 4.1 g/dL (ref 3.6–4.8)
ALT: 9 IU/L (ref 0–32)
AST: 15 IU/L (ref 0–40)
Alkaline Phosphatase: 102 IU/L (ref 39–117)
BUN/Creatinine Ratio: 20 (ref 11–26)
BUN: 15 mg/dL (ref 8–27)
Bilirubin Total: 0.3 mg/dL (ref 0.0–1.2)
CO2: 24 mmol/L (ref 18–29)
CREATININE: 0.76 mg/dL (ref 0.57–1.00)
Calcium: 9.2 mg/dL (ref 8.7–10.3)
Chloride: 104 mmol/L (ref 97–108)
GFR calc Af Amer: 93 mL/min/{1.73_m2} (ref >59)
GFR calc non Af Amer: 80 mL/min/{1.73_m2} (ref >59)
GLOBULIN, TOTAL: 2.2 g/dL (ref 1.5–4.5)
GLUCOSE: 114 mg/dL — AB (ref 65–99)
Potassium: 4.3 mmol/L (ref 3.5–5.2)
SODIUM: 145 mmol/L — AB (ref 134–144)
Total Protein: 6.3 g/dL (ref 6.0–8.5)

## 2015-03-10 LAB — ANEMIA PROFILE B
BASOS: 1 %
Basophils Absolute: 0.1 10*3/uL (ref 0.0–0.2)
EOS (ABSOLUTE): 0.2 10*3/uL (ref 0.0–0.4)
EOS: 4 %
Ferritin: 10 ng/mL — ABNORMAL LOW (ref 15–150)
Folate: 16.4 ng/mL (ref >3.0)
HEMATOCRIT: 33.3 % — AB (ref 34.0–46.6)
HEMOGLOBIN: 10.7 g/dL — AB (ref 11.1–15.9)
IMMATURE GRANS (ABS): 0 10*3/uL (ref 0.0–0.1)
Immature Granulocytes: 0 %
Iron Saturation: 9 % — CL (ref 15–55)
Iron: 35 ug/dL (ref 27–139)
Lymphocytes Absolute: 1.7 10*3/uL (ref 0.7–3.1)
Lymphs: 34 %
MCH: 27.5 pg (ref 26.6–33.0)
MCHC: 32.1 g/dL (ref 31.5–35.7)
MCV: 86 fL (ref 79–97)
MONOCYTES: 7 %
MONOS ABS: 0.4 10*3/uL (ref 0.1–0.9)
NEUTROS ABS: 2.7 10*3/uL (ref 1.4–7.0)
Neutrophils: 54 %
Platelets: 225 10*3/uL (ref 150–379)
RBC: 3.89 x10E6/uL (ref 3.77–5.28)
RDW: 13.7 % (ref 12.3–15.4)
Retic Ct Pct: 0.7 % (ref 0.6–2.6)
TIBC: 376 ug/dL (ref 250–450)
UIBC: 341 ug/dL (ref 118–369)
Vitamin B-12: 227 pg/mL (ref 211–946)
WBC: 5 10*3/uL (ref 3.4–10.8)

## 2015-03-10 LAB — LIPID PANEL
CHOLESTEROL TOTAL: 159 mg/dL (ref 100–199)
Chol/HDL Ratio: 2.6 ratio units (ref 0.0–4.4)
HDL: 62 mg/dL (ref >39)
LDL Calculated: 77 mg/dL (ref 0–99)
TRIGLYCERIDES: 99 mg/dL (ref 0–149)
VLDL Cholesterol Cal: 20 mg/dL (ref 5–40)

## 2015-03-10 LAB — THYROID PANEL WITH TSH
Free Thyroxine Index: 2.6 (ref 1.2–4.9)
T3 Uptake Ratio: 25 % (ref 24–39)
T4, Total: 10.2 ug/dL (ref 4.5–12.0)
TSH: 0.382 u[IU]/mL — ABNORMAL LOW (ref 0.450–4.500)

## 2015-03-12 ENCOUNTER — Other Ambulatory Visit: Payer: PPO

## 2015-03-12 DIAGNOSIS — Z1212 Encounter for screening for malignant neoplasm of rectum: Secondary | ICD-10-CM

## 2015-03-12 NOTE — Progress Notes (Signed)
Lab only 

## 2015-03-13 LAB — FECAL OCCULT BLOOD, IMMUNOCHEMICAL: Fecal Occult Bld: NEGATIVE

## 2015-03-20 ENCOUNTER — Encounter: Payer: Self-pay | Admitting: Nurse Practitioner

## 2015-03-31 ENCOUNTER — Other Ambulatory Visit: Payer: Self-pay | Admitting: Family Medicine

## 2015-03-31 NOTE — Telephone Encounter (Signed)
Ultram rx ready for pick up  

## 2015-03-31 NOTE — Telephone Encounter (Signed)
Last seen 03/09/15 MMM  IF approved print

## 2015-03-31 NOTE — Telephone Encounter (Signed)
Patient aware that rx is ready to be picked up.  

## 2015-04-02 ENCOUNTER — Encounter: Payer: Self-pay | Admitting: *Deleted

## 2015-04-30 ENCOUNTER — Other Ambulatory Visit: Payer: Self-pay | Admitting: Family Medicine

## 2015-04-30 NOTE — Telephone Encounter (Signed)
Ultram rx ready for pick up  

## 2015-04-30 NOTE — Telephone Encounter (Signed)
Last filled 04/01/15, last seen 03/09/15

## 2015-05-27 ENCOUNTER — Encounter: Payer: Self-pay | Admitting: Pediatrics

## 2015-05-27 ENCOUNTER — Ambulatory Visit (INDEPENDENT_AMBULATORY_CARE_PROVIDER_SITE_OTHER): Payer: PPO | Admitting: Pediatrics

## 2015-05-27 VITALS — BP 130/69 | HR 87 | Temp 97.4°F | Ht 63.0 in | Wt 180.6 lb

## 2015-05-27 DIAGNOSIS — R739 Hyperglycemia, unspecified: Secondary | ICD-10-CM | POA: Diagnosis not present

## 2015-05-27 DIAGNOSIS — I10 Essential (primary) hypertension: Secondary | ICD-10-CM | POA: Diagnosis not present

## 2015-05-27 DIAGNOSIS — Z0000 Encounter for general adult medical examination without abnormal findings: Secondary | ICD-10-CM | POA: Diagnosis not present

## 2015-05-27 DIAGNOSIS — D649 Anemia, unspecified: Secondary | ICD-10-CM | POA: Insufficient documentation

## 2015-05-27 LAB — GLUCOSE, POCT (MANUAL RESULT ENTRY): POC GLUCOSE: 115 mg/dL — AB (ref 70–99)

## 2015-05-27 NOTE — Assessment & Plan Note (Deleted)
Negative fecal occult card 3 months ago. Hg 10.7, Iron panel consistent with iron deficiency. Will repeat CBC today, if still low repeat fecal occult card. ___Has been taking iron pills.

## 2015-05-27 NOTE — Progress Notes (Signed)
Subjective:   Phyllis Walker is a 69 y.o. female who presents for an Initial Medicare Annual Wellness Visit.  Moving to [place] [facility] 06/04/2015.  Review of Systems  Review of Systems  Constitutional: Negative for fever, chills and weight loss.  HENT: Negative for congestion, hearing loss, nosebleeds and sore throat.   Eyes: Negative for blurred vision and double vision.  Respiratory: Negative for cough, shortness of breath and wheezing.   Cardiovascular: Negative for chest pain and palpitations.  Gastrointestinal: Negative for nausea, vomiting, abdominal pain, diarrhea and constipation.       Stools are dark since starting iron PO pill  Genitourinary: Negative for dysuria, urgency and frequency.  Musculoskeletal: Positive for joint pain. Negative for myalgias, back pain, falls and neck pain.       Pain in R knee, imrpoved with home remedies for OA.  Skin: Negative for itching and rash.  Neurological: Negative for dizziness, sensory change, focal weakness and headaches.  Psychiatric/Behavioral: Negative for depression. The patient is not nervous/anxious and does not have insomnia.     Current Medications (verified) Outpatient Encounter Prescriptions as of 05/27/2015  Medication Sig  . ALPRAZolam (XANAX) 0.25 MG tablet TAKE  (1)  TABLET TWICE A DAY AS NEEDED.  [name] benazepril-hydrochlorthiazide (LOTENSIN HCT) 20-12.5 MG per tablet Take 1 tablet by mouth daily.  . cetirizine (ZYRTEC) 10 MG tablet Take 10 mg by mouth daily.  [name] gabapentin (NEURONTIN) 100 MG capsule TAKE (1) CAPSULE THREE TIMES DAILY.  [name] levothyroxine (SYNTHROID, LEVOTHROID) 75 MCG tablet Take 1 tablet (75 mcg total) by mouth daily.  [name] rOPINIRole (REQUIP) 0.5 MG tablet Take 1 tablet (0.5 mg total) by mouth at bedtime.  . sertraline (ZOLOFT) 50 MG tablet Take 1 tablet by mouth  daily  . simvastatin (ZOCOR) 40 MG tablet Take 1 tablet by mouth at  bedtime  . traMADol (ULTRAM) 50 MG tablet TAKE  (1)  TABLET  TWICE A DAY AS NEEDED.  [name] zolpidem (AMBIEN) 10 MG tablet Take 1 tablet (10 mg total) by mouth at bedtime as needed. for sleep   No facility-administered encounter medications on file as of 05/27/2015.    Allergies (verified) Amoxicillin-pot clavulanate   History: Past Medical History  Diagnosis Date  . Hyperthyroidism   . Hypertension   . Depression   . Anxiety   . Anemia   . Hypercholesteremia   . Restless leg syndrome   . Fibroid tumor 1985    in breast  . Cataract    Past Surgical History  Procedure Laterality Date  . Thyroidectomy, partial  1969  . Breast lumpectomy  1987    benign fibroid tumor  . Bunionectomy Bilateral   . Cataract extraction     Family History  Problem Relation Age of Onset  . Cancer Mother     pancreas  . Heart attack Father   . Hypertension Sister   . Hyperlipidemia Sister   . Ovarian cancer Maternal Aunt   . Hypertension Sister   . Hyperlipidemia Sister   . Early death Sister 30    accident   Social History   Occupational History  . Not on file.   Social History Main Topics  . Smoking status: Former Smoker    Types: Cigarettes    Quit date: 12/21/2006  . Smokeless tobacco: Never Used  . Alcohol Use: No  . Drug Use: No  . Sexual Activity: No    Do you feel safe at home?  No, looking to move  into retirement community in the next two weeks because was too much to keep up.   Dietary issues and exercise activities: dancing regularly, trying to eat vegetables regularly     Objective:    Today's Vitals   05/27/15 1309  BP: 130/69  Pulse: 87  Temp: 97.4 F (36.3 C)  TempSrc: Oral  Height: 5\' 3"  (1.6 m)  Weight: 180 lb 9.6 oz (81.92 kg)   Body mass index is 32 kg/(m^2).  Activities of Daily Living In your present state of health, do you have any difficulty performing the following activities: 08/20/2014  Hearing? N  Vision? N  Difficulty concentrating or making decisions? N  Walking or climbing stairs? N  Dressing or  bathing? N  Doing errands, shopping? N    Are there smokers in your home (other than you)? No      Depression Screen PHQ 2/9 Scores 05/27/2015 03/09/2015 11/20/2014 08/20/2014  PHQ - 2 Score 0 1 0 0    Fall Risk Fall Risk  05/27/2015 03/09/2015 11/20/2014 08/20/2014 05/12/2014  Falls in the past year? No No No No No    Cognitive Function: No flowsheet data found.  Immunizations and Health Maintenance Immunization History  Administered Date(s) Administered  . Influenza Split 06/18/2013  . Influenza,inj,Quad PF,36+ Mos 06/09/2014  . Pneumococcal Conjugate-13 11/20/2014  . Zoster 05/24/2013   Health Maintenance Due  Topic Date Due  . Hepatitis C Screening  07-Oct-1945  . INFLUENZA VACCINE  04/13/2015    Patient Care Team: Chevis Pretty, FNP as PCP - General (Nurse Practitioner)  Indicate any recent Medical Services you may have received from other than [facility] providers in the past year (date may be approximate).    Assessment:    Annual Wellness Visit    Screening Tests Health Maintenance  Topic Date Due  . Hepatitis C Screening  August 27, 1946  . INFLUENZA VACCINE  04/13/2015  . PNA vac Low Risk Adult (2 of 2 - PPSV23) 11/20/2015  . MAMMOGRAM  07/07/2016  . TETANUS/TDAP  04/12/2018  . COLONOSCOPY  06/12/2020  . DEXA SCAN  Completed  . ZOSTAVAX  Completed        Plan:   During the course of the visit Phyllis Walker was educated and counseled about the following appropriate screening and preventive services:   Vaccines to include UTD  Colorectal cancer screening -- UTD  Cardiovascular disease screening-- UTD  Diabetes screening --UTD   Bone Denisty / Osteoporosis Screening --UTD  Mammogram-- Due 2017  PAP-- last 2 yrs ago, sees Phyllis Walker  Glaucoma screening-- Last saw eye doctor 2 yrs ago  Nutrition counseling-- discussed healthy choices  Smoking cessation counseling-- N/A  Advanced Directives-- Daughter Phyllis Walker [phone], she would want to make  decisions for her.   Goals    None     Neuropathy: Continues to have neuropathy symptoms in feet. She has been told possibly due to knee pain. Also has restless leg syndrome, vitamin B12 level low-normal at 227 at recent check. Rec she start 500-1019mcg of B12 PO daily.  Anemia: Negative fecal occult card x 1, iron panel consistent with iron deficinecy, also with b12 borderline deficiency, MCV normal (80s). Has f/u appt with PCP in 1 month, has been on iron supplement past month.   Hypothyroidism: has f/u appt with PCP in 1 month.  Patient Instructions (the written plan) were given to the patient.   Eustaquio Maize, MD   05/27/2015

## 2015-05-27 NOTE — Patient Instructions (Addendum)
Take 560mcg to 1069mcg of B12 (also called Cyanocobalamin) once a day to help keep vitamin B 12 levels up.    Health Maintenance Adopting a healthy lifestyle and getting preventive care can go a long way to promote health and wellness. Talk with your health care provider about what schedule of regular examinations is right for you. This is a good chance for you to check in with your provider about disease prevention and staying healthy. In between checkups, there are plenty of things you can do on your own. Experts have done a lot of research about which lifestyle changes and preventive measures are most likely to keep you healthy. Ask your health care provider for more information. WEIGHT AND DIET  Eat a healthy diet  Be sure to include plenty of vegetables, fruits, low-fat dairy products, and lean protein.  Do not eat a lot of foods high in solid fats, added sugars, or salt.  Get regular exercise. This is one of the most important things you can do for your health.  Most adults should exercise for at least 150 minutes each week. The exercise should increase your heart rate and make you sweat (moderate-intensity exercise).  Most adults should also do strengthening exercises at least twice a week. This is in addition to the moderate-intensity exercise.  Maintain a healthy weight  Body mass index (BMI) is a measurement that can be used to identify possible weight problems. It estimates body fat based on height and weight. Your health care provider can help determine your BMI and help you achieve or maintain a healthy weight.  For females 54 years of age and older:   A BMI below 18.5 is considered underweight.  A BMI of 18.5 to 24.9 is normal.  A BMI of 25 to 29.9 is considered overweight.  A BMI of 30 and above is considered obese.  Watch levels of cholesterol and blood lipids  You should start having your blood tested for lipids and cholesterol at 70 years of age, then have this  test every 5 years.  You may need to have your cholesterol levels checked more often if:  Your lipid or cholesterol levels are high.  You are older than 70 years of age.  You are at high risk for heart disease.  CANCER SCREENING   Lung Cancer  Lung cancer screening is recommended for adults 97-15 years old who are at high risk for lung cancer because of a history of smoking.  A yearly low-dose CT scan of the lungs is recommended for people who:  Currently smoke.  Have quit within the past 15 years.  Have at least a 30-pack-year history of smoking. A pack year is smoking an average of one pack of cigarettes a day for 1 year.  Yearly screening should continue until it has been 15 years since you quit.  Yearly screening should stop if you develop a health problem that would prevent you from having lung cancer treatment.  Breast Cancer  Practice breast self-awareness. This means understanding how your breasts normally appear and feel.  It also means doing regular breast self-exams. Let your health care provider know about any changes, no matter how small.  If you are in your 20s or 30s, you should have a clinical breast exam (CBE) by a health care provider every 1-3 years as part of a regular health exam.  If you are 58 or older, have a CBE every year. Also consider having a breast X-ray (mammogram) every year.  If you have a family history of breast cancer, talk to your health care provider about genetic screening.  If you are at high risk for breast cancer, talk to your health care provider about having an MRI and a mammogram every year.  Breast cancer gene (BRCA) assessment is recommended for women who have family members with BRCA-related cancers. BRCA-related cancers include:  Breast.  Ovarian.  Tubal.  Peritoneal cancers.  Results of the assessment will determine the need for genetic counseling and BRCA1 and BRCA2 testing. Cervical Cancer Routine pelvic  examinations to screen for cervical cancer are no longer recommended for nonpregnant women who are considered low risk for cancer of the pelvic organs (ovaries, uterus, and vagina) and who do not have symptoms. A pelvic examination may be necessary if you have symptoms including those associated with pelvic infections. Ask your health care provider if a screening pelvic exam is right for you.   The Pap test is the screening test for cervical cancer for women who are considered at risk.  If you had a hysterectomy for a problem that was not cancer or a condition that could lead to cancer, then you no longer need Pap tests.  If you are older than 65 years, and you have had normal Pap tests for the past 10 years, you no longer need to have Pap tests.  If you have had past treatment for cervical cancer or a condition that could lead to cancer, you need Pap tests and screening for cancer for at least 20 years after your treatment.  If you no longer get a Pap test, assess your risk factors if they change (such as having a new sexual partner). This can affect whether you should start being screened again.  Some women have medical problems that increase their chance of getting cervical cancer. If this is the case for you, your health care provider may recommend more frequent screening and Pap tests.  The human papillomavirus (HPV) test is another test that may be used for cervical cancer screening. The HPV test looks for the virus that can cause cell changes in the cervix. The cells collected during the Pap test can be tested for HPV.  The HPV test can be used to screen women 64 years of age and older. Getting tested for HPV can extend the interval between normal Pap tests from three to five years.  An HPV test also should be used to screen women of any age who have unclear Pap test results.  After 70 years of age, women should have HPV testing as often as Pap tests.  Colorectal Cancer  This type of  cancer can be detected and often prevented.  Routine colorectal cancer screening usually begins at 70 years of age and continues through 70 years of age.  Your health care provider may recommend screening at an earlier age if you have risk factors for colon cancer.  Your health care provider may also recommend using home test kits to check for hidden blood in the stool.  A small camera at the end of a tube can be used to examine your colon directly (sigmoidoscopy or colonoscopy). This is done to check for the earliest forms of colorectal cancer.  Routine screening usually begins at age 48.  Direct examination of the colon should be repeated every 5-10 years through 71 years of age. However, you may need to be screened more often if early forms of precancerous polyps or small growths are found. Skin Cancer  Check your skin from head to toe regularly.  Tell your health care provider about any new moles or changes in moles, especially if there is a change in a mole's shape or color.  Also tell your health care provider if you have a mole that is larger than the size of a pencil eraser.  Always use sunscreen. Apply sunscreen liberally and repeatedly throughout the day.  Protect yourself by wearing long sleeves, pants, a wide-brimmed hat, and sunglasses whenever you are outside. HEART DISEASE, DIABETES, AND HIGH BLOOD PRESSURE   Have your blood pressure checked at least every 1-2 years. High blood pressure causes heart disease and increases the risk of stroke.  If you are between 50 years and 48 years old, ask your health care provider if you should take aspirin to prevent strokes.  Have regular diabetes screenings. This involves taking a blood sample to check your fasting blood sugar level.  If you are at a normal weight and have a low risk for diabetes, have this test once every three years after 70 years of age.  If you are overweight and have a high risk for diabetes, consider being  tested at a younger age or more often. PREVENTING INFECTION  Hepatitis B  If you have a higher risk for hepatitis B, you should be screened for this virus. You are considered at high risk for hepatitis B if:  You were born in a country where hepatitis B is common. Ask your health care provider which countries are considered high risk.  Your parents were born in a high-risk country, and you have not been immunized against hepatitis B (hepatitis B vaccine).  You have HIV or AIDS.  You use needles to inject street drugs.  You live with someone who has hepatitis B.  You have had sex with someone who has hepatitis B.  You get hemodialysis treatment.  You take certain medicines for conditions, including cancer, organ transplantation, and autoimmune conditions. Hepatitis C  Blood testing is recommended for:  Everyone born from [birth year] through 1966.  Anyone with known risk factors for hepatitis C. Sexually transmitted infections (STIs)  You should be screened for sexually transmitted infections (STIs) including gonorrhea and chlamydia if:  You are sexually active and are younger than 70 years of age.  You are older than 70 years of age and your health care provider tells you that you are at risk for this type of infection.  Your sexual activity has changed since you were last screened and you are at an increased risk for chlamydia or gonorrhea. Ask your health care provider if you are at risk.  If you do not have HIV, but are at risk, it may be recommended that you take a prescription medicine daily to prevent HIV infection. This is called pre-exposure prophylaxis (PrEP). You are considered at risk if:  You are sexually active and do not regularly use condoms or know the HIV status of your partner(s).  You take drugs by injection.  You are sexually active with a partner who has HIV. Talk with your health care provider about whether you are at high risk of being infected with HIV. If  you choose to begin PrEP, you should first be tested for HIV. You should then be tested every 3 months for as long as you are taking PrEP.  PREGNANCY   If you are premenopausal and you may become pregnant, ask your health care provider about preconception counseling.  If you may become pregnant,  take 400 to 800 micrograms (mcg) of folic acid every day.  If you want to prevent pregnancy, talk to your health care provider about birth control (contraception). OSTEOPOROSIS AND MENOPAUSE   Osteoporosis is a disease in which the bones lose minerals and strength with aging. This can result in serious bone fractures. Your risk for osteoporosis can be identified using a bone density scan.  If you are 10 years of age or older, or if you are at risk for osteoporosis and fractures, ask your health care provider if you should be screened.  Ask your health care provider whether you should take a calcium or vitamin D supplement to lower your risk for osteoporosis.  Menopause may have certain physical symptoms and risks.  Hormone replacement therapy may reduce some of these symptoms and risks. Talk to your health care provider about whether hormone replacement therapy is right for you.  HOME CARE INSTRUCTIONS   Schedule regular health, dental, and eye exams.  Stay current with your immunizations.   Do not use any tobacco products including cigarettes, chewing tobacco, or electronic cigarettes.  If you are pregnant, do not drink alcohol.  If you are breastfeeding, limit how much and how often you drink alcohol.  Limit alcohol intake to no more than 1 drink per day for nonpregnant women. One drink equals 12 ounces of beer, 5 ounces of wine, or 1 ounces of hard liquor.  Do not use street drugs.  Do not share needles.  Ask your health care provider for help if you need support or information about quitting drugs.  Tell your health care provider if you often feel depressed.  Tell your health  care provider if you have ever been abused or do not feel safe at home. Document Released: 03/14/2011 Document Revised: 01/13/2014 Document Reviewed: 07/31/2013 [facility] Patient Information 2015 [place], [place]. This information is not intended to replace advice given to you by your health care provider. Make sure you discuss any questions you have with your health care provider.

## 2015-05-27 NOTE — Assessment & Plan Note (Deleted)
Symptoms stable. No recent changes, TSH has been trending down over past few checks. Check again today. Continue synthroid.

## 2015-05-28 LAB — HEPATITIS C ANTIBODY: Hep C Virus Ab: <0.1 s/co ratio (ref 0.0–0.9)

## 2015-06-01 ENCOUNTER — Other Ambulatory Visit: Payer: Self-pay | Admitting: Nurse Practitioner

## 2015-06-01 ENCOUNTER — Other Ambulatory Visit: Payer: Self-pay | Admitting: Family Medicine

## 2015-06-01 NOTE — Telephone Encounter (Signed)
rx called to pharam[facility]

## 2015-06-01 NOTE — Telephone Encounter (Signed)
Last seen 03/09/15 MMM If approved route to nurse to call into [facility]

## 2015-06-01 NOTE — Telephone Encounter (Signed)
Please call in alprazolam with 1 refills 

## 2015-06-01 NOTE — Telephone Encounter (Signed)
Last seen 03/09/15  MMM if approve print

## 2015-06-01 NOTE — Telephone Encounter (Signed)
Patient aware that rx is ready to be picked up.  

## 2015-06-01 NOTE — Telephone Encounter (Signed)
Ultram rx ready for pick up  

## 2015-06-17 ENCOUNTER — Ambulatory Visit: Payer: PPO | Admitting: Nurse Practitioner

## 2015-06-19 ENCOUNTER — Encounter: Payer: Self-pay | Admitting: Nurse Practitioner

## 2015-06-19 ENCOUNTER — Ambulatory Visit (INDEPENDENT_AMBULATORY_CARE_PROVIDER_SITE_OTHER): Payer: PPO | Admitting: Nurse Practitioner

## 2015-06-19 VITALS — BP 112/69 | HR 95 | Temp 96.9°F | Ht 63.0 in | Wt 182.0 lb

## 2015-06-19 DIAGNOSIS — E034 Atrophy of thyroid (acquired): Secondary | ICD-10-CM | POA: Diagnosis not present

## 2015-06-19 DIAGNOSIS — Z23 Encounter for immunization: Secondary | ICD-10-CM

## 2015-06-19 DIAGNOSIS — G629 Polyneuropathy, unspecified: Secondary | ICD-10-CM

## 2015-06-19 DIAGNOSIS — D649 Anemia, unspecified: Secondary | ICD-10-CM

## 2015-06-19 DIAGNOSIS — F411 Generalized anxiety disorder: Secondary | ICD-10-CM | POA: Diagnosis not present

## 2015-06-19 DIAGNOSIS — G4700 Insomnia, unspecified: Secondary | ICD-10-CM

## 2015-06-19 DIAGNOSIS — F32A Depression, unspecified: Secondary | ICD-10-CM

## 2015-06-19 DIAGNOSIS — E785 Hyperlipidemia, unspecified: Secondary | ICD-10-CM | POA: Diagnosis not present

## 2015-06-19 DIAGNOSIS — I10 Essential (primary) hypertension: Secondary | ICD-10-CM

## 2015-06-19 DIAGNOSIS — Z6832 Body mass index (BMI) 32.0-32.9, adult: Secondary | ICD-10-CM | POA: Diagnosis not present

## 2015-06-19 DIAGNOSIS — R300 Dysuria: Secondary | ICD-10-CM | POA: Diagnosis not present

## 2015-06-19 DIAGNOSIS — F329 Major depressive disorder, single episode, unspecified: Secondary | ICD-10-CM

## 2015-06-19 DIAGNOSIS — E038 Other specified hypothyroidism: Secondary | ICD-10-CM

## 2015-06-19 DIAGNOSIS — Z6831 Body mass index (BMI) 31.0-31.9, adult: Secondary | ICD-10-CM | POA: Insufficient documentation

## 2015-06-19 LAB — POCT UA - MICROSCOPIC ONLY
Casts, Ur, LPF, POC: NEGATIVE
Crystals, Ur, HPF, POC: NEGATIVE
Mucus, UA: NEGATIVE
Yeast, UA: NEGATIVE

## 2015-06-19 LAB — POCT URINALYSIS DIPSTICK
Bilirubin, UA: NEGATIVE
GLUCOSE UA: NEGATIVE
Ketones, UA: NEGATIVE
Nitrite, UA: NEGATIVE
PROTEIN UA: NEGATIVE
Spec Grav, UA: 1.01
UROBILINOGEN UA: NEGATIVE
pH, UA: 7

## 2015-06-19 MED ORDER — SIMVASTATIN 40 MG PO TABS: ORAL_TABLET | ORAL | Status: DC

## 2015-06-19 MED ORDER — SERTRALINE HCL 50 MG PO TABS: ORAL_TABLET | ORAL | Status: DC

## 2015-06-19 MED ORDER — ALPRAZOLAM 0.25 MG PO TABS: ORAL_TABLET | ORAL | Status: DC

## 2015-06-19 MED ORDER — ZOLPIDEM TARTRATE 10 MG PO TABS: 10.0000 mg | ORAL_TABLET | Freq: Every evening | ORAL | Status: DC | PRN

## 2015-06-19 MED ORDER — TRAMADOL HCL 50 MG PO TABS: ORAL_TABLET | ORAL | Status: DC

## 2015-06-19 NOTE — Progress Notes (Signed)
Subjective:    Patient ID: Phyllis Walker, female    DOB: Sep 25, 1945, 69 y.o.   MRN: [id number]  Patient here today for follow up of chronic medical problems. No complaints today.  Hyperlipidemia This is a chronic problem. The current episode started more than 1 year ago. The problem is controlled. Recent lipid tests were reviewed and are normal. Exacerbating diseases include hypothyroidism. She has no history of diabetes. Pertinent negatives include no chest pain or shortness of breath. She is currently on no antihyperlipidemic treatment. Compliance problems include adherence to diet.  Risk factors for coronary artery disease include dyslipidemia, hypertension and post-menopausal.  Hypertension This is a chronic problem. The current episode started more than 1 year ago. The problem is controlled. Pertinent negatives include no chest pain, headaches, neck pain, palpitations or shortness of breath. Risk factors for coronary artery disease include dyslipidemia and post-menopausal state. Past treatments include diuretics and ACE inhibitors. The current treatment provides moderate improvement. There are no compliance problems.  Hypertensive end-organ damage includes a thyroid problem.  Thyroid Problem Presents for follow-up visit. Patient reports no diarrhea, menstrual problem, palpitations or visual change. The symptoms have been stable. Her past medical history is significant for hyperlipidemia. There is no history of diabetes.  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 She also takes tramadol on occasion. Depression/Gad Zoloft and xanax- combination working well. RLS Patient on requip which is working great. anemia Long history currently not on anything insomnia ambien nightly helps her rest well   Review of Systems  Constitutional: Negative.   HENT: Negative.   Respiratory: Negative for shortness of breath.     Cardiovascular: Negative for chest pain and palpitations.  Gastrointestinal: Negative for diarrhea.  Genitourinary: Negative for menstrual problem.  Musculoskeletal: Negative for neck pain.  Neurological: Negative for headaches.  All other systems reviewed and are negative.      Objective:   Physical Exam  Constitutional: She is oriented to person, place, and time. She appears well-developed and well-nourished.  HENT:  Nose: Nose normal.  Mouth/Throat: Oropharynx is clear and moist.  Eyes: EOM are normal.  Neck: Trachea normal, normal range of motion and full passive range of motion without pain. Neck supple. No JVD present. Carotid bruit is not present. No thyromegaly present.  Cardiovascular: Normal rate, regular rhythm, normal heart sounds and intact distal pulses.  Exam reveals no gallop and no friction rub.   No murmur heard. Pulmonary/Chest: Effort normal and breath sounds normal.  Abdominal: Soft. Bowel sounds are normal. She exhibits no distension and no mass. There is no tenderness.  Musculoskeletal: Normal range of motion.  Lymphadenopathy:    She has no cervical adenopathy.  Neurological: She is alert and oriented to person, place, and time. She has normal reflexes.  Skin: Skin is warm and dry.  Psychiatric: She has a normal mood and affect. Her behavior is normal. Judgment and thought content normal.   BP 112/69 mmHg  Pulse 95  Temp(Src) 96.9 F (36.1 C) (Oral)  Ht '5\' 3"'  (1.6 m)  Wt 182 lb (82.555 kg)  BMI 32.25 kg/m2       Assessment & Plan:   1. Dysuria Urine normal - POCT UA - Microscopic Only - POCT urinalysis dipstick  2. Hypertension, benign essential, goal below 140/90 Do not add salt to diet - CMP14+EGFR  3. Hypothyroidism due to acquired atrophy of thyroid  4. Neuropathy ([place]) -  traMADol (ULTRAM) 50 MG tablet; TAKE  (1)  TABLET TWICE A DAY AS NEEDED.  Dispense: 60 tablet; Refill: 1  5. GAD (generalized anxiety disorder) Stress  management - ALPRAZolam (XANAX) 0.25 MG tablet; TAKE  (1)  TABLET TWICE A DAY AS NEEDED.  Dispense: 60 tablet; Refill: 1  6. Depression - sertraline (ZOLOFT) 50 MG tablet; Take 1 tablet by mouth  daily  Dispense: 90 tablet; Refill: 1  7. Insomnia Bedtime ritual - zolpidem (AMBIEN) 10 MG tablet; Take 1 tablet (10 mg total) by mouth at bedtime as needed. for sleep  Dispense: 30 tablet; Refill: 2  8. Anemia, unspecified anemia type  9. Hyperlipidemia with target LDL less than 100 Low fat diet - simvastatin (ZOCOR) 40 MG tablet; Take 1 tablet by mouth at  bedtime  Dispense: 90 tablet; Refill: 1 - Lipid panel  10. BMI 32.0-32.9,adult Discussed diet and exercise for person with BMI >25 Will recheck weight in 3-6 months     Labs pending Health maintenance reviewed Diet and exercise encouraged Continue all meds Follow up  In 3 month   [place], FNP

## 2015-06-19 NOTE — Patient Instructions (Signed)
Bone Health Bones protect organs, store calcium, and anchor muscles. Good health habits, such as eating nutritious foods and exercising regularly, are important for maintaining healthy bones. They can also help to prevent a condition that causes bones to lose density and become weak and brittle (osteoporosis). WHY IS BONE MASS IMPORTANT? Bone mass refers to the amount of bone tissue that you have. The higher your bone mass, the stronger your bones. An important step toward having healthy bones throughout life is to have strong and dense bones during childhood. A young adult who has a high bone mass is more likely to have a high bone mass later in life. Bone mass at its greatest it is called peak bone mass. A large decline in bone mass occurs in older adults. In women, it occurs about the time of menopause. During this time, it is important to practice good health habits, because if more bone is lost than what is replaced, the bones will become less healthy and more likely to break (fracture). If you find that you have a low bone mass, you may be able to prevent osteoporosis or further bone loss by changing your diet and lifestyle. HOW CAN I FIND OUT IF MY BONE MASS IS LOW? Bone mass can be measured with an X-ray test that is called a bone mineral density (BMD) test. This test is recommended for all women who are age 65 or older. It may also be recommended for men who are age 70 or older, or for people who are more likely to develop osteoporosis due to:  Having bones that break easily.  Having a long-term disease that weakens bones, such as kidney disease or rheumatoid arthritis.  Having menopause earlier than normal.  Taking medicine that weakens bones, such as steroids, thyroid hormones, or hormone treatment for breast cancer or prostate cancer.  Smoking.  Drinking three or more alcoholic drinks each day. WHAT ARE THE NUTRITIONAL RECOMMENDATIONS FOR HEALTHY BONES? To have healthy bones, you need  to get enough of the right minerals and vitamins. Most nutrition experts recommend getting these nutrients from the foods that you eat. Nutritional recommendations vary from person to person. Ask your health care provider what is healthy for you. Here are some general guidelines. Calcium Recommendations Calcium is the most important (essential) mineral for bone health. Most people can get enough calcium from their diet, but supplements may be recommended for people who are at risk for osteoporosis. Good sources of calcium include:  Dairy products, such as low-fat or nonfat milk, cheese, and yogurt.  Dark green leafy vegetables, such as bok choy and broccoli.  Calcium-fortified foods, such as orange juice, cereal, bread, soy beverages, and tofu products.  Nuts, such as almonds. Follow these recommended amounts for daily calcium intake:  Children, age 1-3: 700 mg.  Children, age 4-8: 1,000 mg.  Children, age 9-13: 1,300 mg.  Teens, age 14-18: 1,300 mg.  Adults, age 19-50: 1,000 mg.  Adults, age 51-70:  Men: 1,000 mg.  Women: 1,200 mg.  Adults, age 71 or older: 1,200 mg.  Pregnant and breastfeeding females:  Teens: 1,300 mg.  Adults: 1,000 mg. Vitamin D Recommendations Vitamin D is the most essential vitamin for bone health. It helps the body to absorb calcium. Sunlight stimulates the skin to make vitamin D, so be sure to get enough sunlight. If you live in a cold climate or you do not get outside often, your health care provider may recommend that you take vitamin D supplements. Good   sources of vitamin D in your diet include:  Egg yolks.  Saltwater fish.  Milk and cereal fortified with vitamin D. Follow these recommended amounts for daily vitamin D intake:  Children and teens, age 1-18: 600 international units.  Adults, age 50 or younger: 400-800 international units.  Adults, age 51 or older: 800-1,000 international units. Other Nutrients Other nutrients for bone  health include:  Phosphorus. This mineral is found in meat, poultry, dairy foods, nuts, and legumes. The recommended daily intake for adult men and adult women is 700 mg.  Magnesium. This mineral is found in seeds, nuts, dark green vegetables, and legumes. The recommended daily intake for adult men is 400-420 mg. For adult women, it is 310-320 mg.  Vitamin K. This vitamin is found in green leafy vegetables. The recommended daily intake is 120 mg for adult men and 90 mg for adult women. WHAT TYPE OF PHYSICAL ACTIVITY IS BEST FOR BUILDING AND MAINTAINING HEALTHY BONES? Weight-bearing and strength-building activities are important for building and maintaining peak bone mass. Weight-bearing activities cause muscles and bones to work against gravity. Strength-building activities increases muscle strength that supports bones. Weight-bearing and muscle-building activities include:  Walking and hiking.  Jogging and running.  Dancing.  Gym exercises.  Lifting weights.  Tennis and racquetball.  Climbing stairs.  Aerobics. Adults should get at least 30 minutes of moderate physical activity on most days. Children should get at least 60 minutes of moderate physical activity on most days. Ask your health care provide what type of exercise is best for you. WHERE CAN I FIND MORE INFORMATION? For more information, check out the following websites:  National Osteoporosis Foundation: http://nof.org/learn/basics  National Institutes of Health: http://www.niams.nih.gov/Health_Info/Bone/Bone_Health/bone_health_for_life.asp   This information is not intended to replace advice given to you by your health care provider. Make sure you discuss any questions you have with your health care provider.   Document Released: 11/19/2003 Document Revised: 01/13/2015 Document Reviewed: 09/03/2014 Elsevier Interactive Patient Education 2016 [employer]  

## 2015-06-20 LAB — ANEMIA PROFILE B
Basophils Absolute: 0.1 10*3/uL (ref 0.0–0.2)
Basos: 1 %
EOS (ABSOLUTE): 0.3 10*3/uL (ref 0.0–0.4)
EOS: 5 %
Ferritin: 59 ng/mL (ref 15–150)
Folate: 9.6 ng/mL (ref >3.0)
HEMOGLOBIN: 12.8 g/dL (ref 11.1–15.9)
Hematocrit: 37.3 % (ref 34.0–46.6)
IMMATURE GRANS (ABS): 0 10*3/uL (ref 0.0–0.1)
IMMATURE GRANULOCYTES: 0 %
IRON SATURATION: 33 % (ref 15–55)
IRON: 96 ug/dL (ref 27–139)
Lymphocytes Absolute: 1.8 10*3/uL (ref 0.7–3.1)
Lymphs: 32 %
MCH: 30.1 pg (ref 26.6–33.0)
MCHC: 34.3 g/dL (ref 31.5–35.7)
MCV: 88 fL (ref 79–97)
Monocytes Absolute: 0.4 10*3/uL (ref 0.1–0.9)
Monocytes: 7 %
NEUTROS PCT: 55 %
Neutrophils Absolute: 3.1 10*3/uL (ref 1.4–7.0)
Platelets: 209 10*3/uL (ref 150–379)
RBC: 4.25 x10E6/uL (ref 3.77–5.28)
RDW: 14.2 % (ref 12.3–15.4)
RETIC CT PCT: 0.7 % (ref 0.6–2.6)
TIBC: 292 ug/dL (ref 250–450)
UIBC: 196 ug/dL (ref 118–369)
Vitamin B-12: 470 pg/mL (ref 211–946)
WBC: 5.7 10*3/uL (ref 3.4–10.8)

## 2015-06-20 LAB — CMP14+EGFR
ALBUMIN: 4.4 g/dL (ref 3.6–4.8)
ALT: 12 IU/L (ref 0–32)
AST: 18 IU/L (ref 0–40)
Albumin/Globulin Ratio: 1.9 (ref 1.1–2.5)
Alkaline Phosphatase: 91 IU/L (ref 39–117)
BILIRUBIN TOTAL: 0.5 mg/dL (ref 0.0–1.2)
BUN / CREAT RATIO: 23 (ref 11–26)
BUN: 17 mg/dL (ref 8–27)
CHLORIDE: 100 mmol/L (ref 97–108)
CO2: 29 mmol/L (ref 18–29)
Calcium: 9.4 mg/dL (ref 8.7–10.3)
Creatinine, Ser: 0.75 mg/dL (ref 0.57–1.00)
GFR calc non Af Amer: 82 mL/min/{1.73_m2} (ref >59)
GFR, EST AFRICAN AMERICAN: 94 mL/min/{1.73_m2} (ref >59)
GLOBULIN, TOTAL: 2.3 g/dL (ref 1.5–4.5)
GLUCOSE: 89 mg/dL (ref 65–99)
Potassium: 4.2 mmol/L (ref 3.5–5.2)
Sodium: 143 mmol/L (ref 134–144)
TOTAL PROTEIN: 6.7 g/dL (ref 6.0–8.5)

## 2015-06-20 LAB — LIPID PANEL
Chol/HDL Ratio: 2.7 ratio units (ref 0.0–4.4)
Cholesterol, Total: 178 mg/dL (ref 100–199)
HDL: 65 mg/dL (ref >39)
LDL CALC: 98 mg/dL (ref 0–99)
Triglycerides: 77 mg/dL (ref 0–149)
VLDL CHOLESTEROL CAL: 15 mg/dL (ref 5–40)

## 2015-08-31 ENCOUNTER — Other Ambulatory Visit: Payer: Self-pay | Admitting: Nurse Practitioner

## 2015-09-01 NOTE — Telephone Encounter (Signed)
Patient notified that rx up front and ready for pick up 

## 2015-09-01 NOTE — Telephone Encounter (Signed)
Tramadol rx ready for pick up

## 2015-09-01 NOTE — Telephone Encounter (Signed)
Last seen 06/19/15  MMM If approved print

## 2015-09-21 ENCOUNTER — Encounter: Payer: Self-pay | Admitting: Nurse Practitioner

## 2015-09-21 ENCOUNTER — Ambulatory Visit (INDEPENDENT_AMBULATORY_CARE_PROVIDER_SITE_OTHER): Payer: PPO | Admitting: Nurse Practitioner

## 2015-09-21 VITALS — BP 125/69 | HR 80 | Temp 97.1°F | Ht 63.0 in | Wt 178.0 lb

## 2015-09-21 DIAGNOSIS — M80079D Age-related osteoporosis with current pathological fracture, unspecified ankle and foot, subsequent encounter for fracture with routine healing: Secondary | ICD-10-CM | POA: Diagnosis not present

## 2015-09-21 DIAGNOSIS — G4700 Insomnia, unspecified: Secondary | ICD-10-CM

## 2015-09-21 DIAGNOSIS — G629 Polyneuropathy, unspecified: Secondary | ICD-10-CM

## 2015-09-21 DIAGNOSIS — E039 Hypothyroidism, unspecified: Secondary | ICD-10-CM

## 2015-09-21 DIAGNOSIS — E785 Hyperlipidemia, unspecified: Secondary | ICD-10-CM

## 2015-09-21 DIAGNOSIS — I10 Essential (primary) hypertension: Secondary | ICD-10-CM | POA: Diagnosis not present

## 2015-09-21 DIAGNOSIS — F411 Generalized anxiety disorder: Secondary | ICD-10-CM

## 2015-09-21 DIAGNOSIS — G2581 Restless legs syndrome: Secondary | ICD-10-CM | POA: Diagnosis not present

## 2015-09-21 DIAGNOSIS — Z6832 Body mass index (BMI) 32.0-32.9, adult: Secondary | ICD-10-CM | POA: Diagnosis not present

## 2015-09-21 DIAGNOSIS — F32A Depression, unspecified: Secondary | ICD-10-CM

## 2015-09-21 DIAGNOSIS — F329 Major depressive disorder, single episode, unspecified: Secondary | ICD-10-CM | POA: Diagnosis not present

## 2015-09-21 MED ORDER — ROPINIROLE HCL 0.5 MG PO TABS: 0.5000 mg | ORAL_TABLET | Freq: Every day | ORAL | Status: DC

## 2015-09-21 MED ORDER — ALPRAZOLAM 0.25 MG PO TABS: ORAL_TABLET | ORAL | Status: DC

## 2015-09-21 MED ORDER — ZOLPIDEM TARTRATE 10 MG PO TABS: 10.0000 mg | ORAL_TABLET | Freq: Every evening | ORAL | Status: DC | PRN

## 2015-09-21 MED ORDER — LEVOTHYROXINE SODIUM 75 MCG PO TABS: 75.0000 ug | ORAL_TABLET | Freq: Every day | ORAL | Status: DC

## 2015-09-21 MED ORDER — BENAZEPRIL-HYDROCHLOROTHIAZIDE 20-12.5 MG PO TABS: 1.0000 | ORAL_TABLET | Freq: Every day | ORAL | Status: DC

## 2015-09-21 MED ORDER — TRAMADOL HCL 50 MG PO TABS: ORAL_TABLET | ORAL | Status: DC

## 2015-09-21 MED ORDER — GABAPENTIN 100 MG PO CAPS: ORAL_CAPSULE | ORAL | Status: DC

## 2015-09-21 NOTE — Progress Notes (Signed)
   Subjective:    Patient ID: Phyllis Walker, female    DOB: Jul 24, 1946, 70 y.o.   MRN: [id number]  HPI    Review of Systems     Objective:   Physical Exam        Assessment & Plan:

## 2015-09-21 NOTE — Progress Notes (Signed)
Subjective:    Patient ID: MADAILEIN LONDO, female    DOB: 12/31/1945, 70 y.o.   MRN: [id number]  Patient here today for follow up of chronic medical problems. No complaints today.  Hyperlipidemia This is a chronic problem. The current episode started more than 1 year ago. The problem is controlled. Recent lipid tests were reviewed and are normal. Exacerbating diseases include hypothyroidism. She has no history of diabetes. Pertinent negatives include no chest pain or shortness of breath. She is currently on no antihyperlipidemic treatment. Compliance problems include adherence to diet.  Risk factors for coronary artery disease include dyslipidemia, hypertension and post-menopausal.  Hypertension This is a chronic problem. The current episode started more than 1 year ago. The problem is controlled. Pertinent negatives include no chest pain, headaches, neck pain, palpitations or shortness of breath. Risk factors for coronary artery disease include dyslipidemia and post-menopausal state. Past treatments include diuretics and ACE inhibitors. The current treatment provides moderate improvement. There are no compliance problems.  Hypertensive end-organ damage includes a thyroid problem.  Thyroid Problem Presents for follow-up visit. Patient reports no diarrhea, menstrual problem, palpitations or visual change. The symptoms have been stable. Her past medical history is significant for hyperlipidemia. There is no history of diabetes.  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 She also takes tramadol on occasion. Depression/Gad Zoloft and xanax- combination working well. RLS Patient on requip which is working great.     Review of Systems  Constitutional: Negative.   HENT: Negative.   Respiratory: Negative for shortness of breath.   Cardiovascular: Negative for chest pain and palpitations.  Gastrointestinal: Negative for  diarrhea.  Genitourinary: Negative for menstrual problem.  Musculoskeletal: Negative for neck pain.  Neurological: Negative for headaches.  All other systems reviewed and are negative.      Objective:   Physical Exam  Constitutional: She is oriented to person, place, and time. She appears well-developed and well-nourished.  HENT:  Nose: Nose normal.  Mouth/Throat: Oropharynx is clear and moist.  Eyes: EOM are normal.  Neck: Trachea normal, normal range of motion and full passive range of motion without pain. Neck supple. No JVD present. Carotid bruit is not present. No thyromegaly present.  Cardiovascular: Normal rate, regular rhythm, normal heart sounds and intact distal pulses.  Exam reveals no gallop and no friction rub.   No murmur heard. Pulmonary/Chest: Effort normal and breath sounds normal.  Abdominal: Soft. Bowel sounds are normal. She exhibits no distension and no mass. There is no tenderness.  Musculoskeletal: Normal range of motion.  Lymphadenopathy:    She has no cervical adenopathy.  Neurological: She is alert and oriented to person, place, and time. She has normal reflexes.  Skin: Skin is warm and dry.  Psychiatric: She has a normal mood and affect. Her behavior is normal. Judgment and thought content normal.   BP 125/69 mmHg  Pulse 80  Temp(Src) 97.1 F (36.2 C) (Oral)  Ht _0  (1.6 m)  Wt 178 lb (80.74 kg)  BMI 31.54 kg/m2        Assessment & Plan:  1. GAD (generalized anxiety disorder) Stress management - ALPRAZolam (XANAX) 0.25 MG tablet; TAKE  (1)  TABLET TWICE A DAY AS NEEDED.  Dispense: 60 tablet; Refill: 1  2. Hypertension, benign essential, goal below 140/90 Do not ad  Salt to diet - benazepril-hydrochlorthiazide (LOTENSIN HCT) 20-12.5 MG tablet; Take 1 tablet by mouth daily.  Dispense: 90 tablet; Refill: 1 - CMP14+EGFR  3. Hypothyroidism, unspecified hypothyroidism type - levothyroxine (SYNTHROID, LEVOTHROID) 75 MCG tablet; Take 1 tablet (75  mcg total) by mouth daily.  Dispense: 90 tablet; Refill: 2  4. Insomnia Bedtime ritual - zolpidem (AMBIEN) 10 MG tablet; Take 1 tablet (10 mg total) by mouth at bedtime as needed. for sleep  Dispense: 30 tablet; Refill: 2  5. Neuropathy (HCC) - gabapentin (NEURONTIN) 100 MG capsule; TAKE (1) CAPSULE THREE TIMES DAILY.  Dispense: 90 capsule; Refill: 5  6. Restless leg syndrome Keep  Legs warm - rOPINIRole (REQUIP) 0.5 MG tablet; Take 1 tablet (0.5 mg total) by mouth at bedtime.  Dispense: 90 tablet; Refill: 1  7. Depression Stress management  8. Hyperlipidemia with target LDL less than 100 Low fat diet - Lipid panel  9. BMI 32.0-32.9,adult Discussed diet and exercise for person with BMI >25 Will recheck weight in 3-6 months   10. 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  Dispense: 60 tablet; Refill: 1    Labs pending Health maintenance reviewed Diet and exercise encouraged Continue all meds Follow up  In 3 months   [place], FNP

## 2015-09-21 NOTE — Patient Instructions (Signed)
Bone Health Bones protect organs, store calcium, and anchor muscles. Good health habits, such as eating nutritious foods and exercising regularly, are important for maintaining healthy bones. They can also help to prevent a condition that causes bones to lose density and become weak and brittle (osteoporosis). WHY IS BONE MASS IMPORTANT? Bone mass refers to the amount of bone tissue that you have. The higher your bone mass, the stronger your bones. An important step toward having healthy bones throughout life is to have strong and dense bones during childhood. A young adult who has a high bone mass is more likely to have a high bone mass later in life. Bone mass at its greatest it is called peak bone mass. A large decline in bone mass occurs in older adults. In women, it occurs about the time of menopause. During this time, it is important to practice good health habits, because if more bone is lost than what is replaced, the bones will become less healthy and more likely to break (fracture). If you find that you have a low bone mass, you may be able to prevent osteoporosis or further bone loss by changing your diet and lifestyle. HOW CAN I FIND OUT IF MY BONE MASS IS LOW? Bone mass can be measured with an X-ray test that is called a bone mineral density (BMD) test. This test is recommended for all women who are age 65 or older. It may also be recommended for men who are age 70 or older, or for people who are more likely to develop osteoporosis due to:  Having bones that break easily.  Having a long-term disease that weakens bones, such as kidney disease or rheumatoid arthritis.  Having menopause earlier than normal.  Taking medicine that weakens bones, such as steroids, thyroid hormones, or hormone treatment for breast cancer or prostate cancer.  Smoking.  Drinking three or more alcoholic drinks each day. WHAT ARE THE NUTRITIONAL RECOMMENDATIONS FOR HEALTHY BONES? To have healthy bones, you need  to get enough of the right minerals and vitamins. Most nutrition experts recommend getting these nutrients from the foods that you eat. Nutritional recommendations vary from person to person. Ask your health care provider what is healthy for you. Here are some general guidelines. Calcium Recommendations Calcium is the most important (essential) mineral for bone health. Most people can get enough calcium from their diet, but supplements may be recommended for people who are at risk for osteoporosis. Good sources of calcium include:  Dairy products, such as low-fat or nonfat milk, cheese, and yogurt.  Dark green leafy vegetables, such as bok choy and broccoli.  Calcium-fortified foods, such as orange juice, cereal, bread, soy beverages, and tofu products.  Nuts, such as almonds. Follow these recommended amounts for daily calcium intake:  Children, age 1-3: 700 mg.  Children, age 4-8: 1,000 mg.  Children, age 9-13: 1,300 mg.  Teens, age 14-18: 1,300 mg.  Adults, age 19-50: 1,000 mg.  Adults, age 51-70:  Men: 1,000 mg.  Women: 1,200 mg.  Adults, age 71 or older: 1,200 mg.  Pregnant and breastfeeding females:  Teens: 1,300 mg.  Adults: 1,000 mg. Vitamin D Recommendations Vitamin D is the most essential vitamin for bone health. It helps the body to absorb calcium. Sunlight stimulates the skin to make vitamin D, so be sure to get enough sunlight. If you live in a cold climate or you do not get outside often, your health care provider may recommend that you take vitamin D supplements. Good   sources of vitamin D in your diet include:  Egg yolks.  Saltwater fish.  Milk and cereal fortified with vitamin D. Follow these recommended amounts for daily vitamin D intake:  Children and teens, age 1-18: 600 international units.  Adults, age 50 or younger: 400-800 international units.  Adults, age 51 or older: 800-1,000 international units. Other Nutrients Other nutrients for bone  health include:  Phosphorus. This mineral is found in meat, poultry, dairy foods, nuts, and legumes. The recommended daily intake for adult men and adult women is 700 mg.  Magnesium. This mineral is found in seeds, nuts, dark green vegetables, and legumes. The recommended daily intake for adult men is 400-420 mg. For adult women, it is 310-320 mg.  Vitamin K. This vitamin is found in green leafy vegetables. The recommended daily intake is 120 mg for adult men and 90 mg for adult women. WHAT TYPE OF PHYSICAL ACTIVITY IS BEST FOR BUILDING AND MAINTAINING HEALTHY BONES? Weight-bearing and strength-building activities are important for building and maintaining peak bone mass. Weight-bearing activities cause muscles and bones to work against gravity. Strength-building activities increases muscle strength that supports bones. Weight-bearing and muscle-building activities include:  Walking and hiking.  Jogging and running.  Dancing.  Gym exercises.  Lifting weights.  Tennis and racquetball.  Climbing stairs.  Aerobics. Adults should get at least 30 minutes of moderate physical activity on most days. Children should get at least 60 minutes of moderate physical activity on most days. Ask your health care provide what type of exercise is best for you. WHERE CAN I FIND MORE INFORMATION? For more information, check out the following websites:  National Osteoporosis Foundation: http://nof.org/learn/basics  National Institutes of Health: http://www.niams.nih.gov/Health_Info/Bone/Bone_Health/bone_health_for_life.asp   This information is not intended to replace advice given to you by your health care provider. Make sure you discuss any questions you have with your health care provider.   Document Released: 11/19/2003 Document Revised: 01/13/2015 Document Reviewed: 09/03/2014 Elsevier Interactive Patient Education 2016 [employer]  

## 2015-09-22 LAB — CMP14+EGFR
ALBUMIN: 4.4 g/dL (ref 3.5–4.8)
ALT: 12 IU/L (ref 0–32)
AST: 19 IU/L (ref 0–40)
Albumin/Globulin Ratio: 1.9 (ref 1.1–2.5)
Alkaline Phosphatase: 99 IU/L (ref 39–117)
BUN/Creatinine Ratio: 21 (ref 11–26)
BUN: 16 mg/dL (ref 8–27)
Bilirubin Total: 0.3 mg/dL (ref 0.0–1.2)
CALCIUM: 9 mg/dL (ref 8.7–10.3)
CHLORIDE: 97 mmol/L (ref 96–106)
CO2: 30 mmol/L — AB (ref 18–29)
CREATININE: 0.75 mg/dL (ref 0.57–1.00)
GFR, EST AFRICAN AMERICAN: 93 mL/min/{1.73_m2} (ref >59)
GFR, EST NON AFRICAN AMERICAN: 81 mL/min/{1.73_m2} (ref >59)
GLUCOSE: 84 mg/dL (ref 65–99)
Globulin, Total: 2.3 g/dL (ref 1.5–4.5)
Potassium: 3.8 mmol/L (ref 3.5–5.2)
Sodium: 140 mmol/L (ref 134–144)
TOTAL PROTEIN: 6.7 g/dL (ref 6.0–8.5)

## 2015-09-22 LAB — LIPID PANEL
CHOL/HDL RATIO: 2.6 ratio (ref 0.0–4.4)
Cholesterol, Total: 168 mg/dL (ref 100–199)
HDL: 65 mg/dL (ref >39)
LDL CALC: 73 mg/dL (ref 0–99)
Triglycerides: 152 mg/dL — ABNORMAL HIGH (ref 0–149)
VLDL CHOLESTEROL CAL: 30 mg/dL (ref 5–40)

## 2015-11-26 ENCOUNTER — Other Ambulatory Visit: Payer: Self-pay | Admitting: Nurse Practitioner

## 2015-11-26 NOTE — Telephone Encounter (Signed)
Last seen 09/21/15  MMM If approved route to nurse to call into [facility] 

## 2015-11-27 NOTE — Telephone Encounter (Signed)
rx called into pharmacy

## 2015-11-27 NOTE — Telephone Encounter (Signed)
Please call in xanax and ambien with 1 refill each 

## 2015-12-21 ENCOUNTER — Ambulatory Visit (INDEPENDENT_AMBULATORY_CARE_PROVIDER_SITE_OTHER): Payer: PPO | Admitting: Nurse Practitioner

## 2015-12-21 ENCOUNTER — Encounter: Payer: Self-pay | Admitting: Nurse Practitioner

## 2015-12-21 VITALS — BP 116/71 | HR 78 | Temp 96.7°F | Ht 63.0 in | Wt 178.0 lb

## 2015-12-21 DIAGNOSIS — Z6831 Body mass index (BMI) 31.0-31.9, adult: Secondary | ICD-10-CM | POA: Diagnosis not present

## 2015-12-21 DIAGNOSIS — E034 Atrophy of thyroid (acquired): Secondary | ICD-10-CM | POA: Diagnosis not present

## 2015-12-21 DIAGNOSIS — D649 Anemia, unspecified: Secondary | ICD-10-CM | POA: Diagnosis not present

## 2015-12-21 DIAGNOSIS — F411 Generalized anxiety disorder: Secondary | ICD-10-CM

## 2015-12-21 DIAGNOSIS — M25551 Pain in right hip: Secondary | ICD-10-CM

## 2015-12-21 DIAGNOSIS — F32A Depression, unspecified: Secondary | ICD-10-CM

## 2015-12-21 DIAGNOSIS — J302 Other seasonal allergic rhinitis: Secondary | ICD-10-CM

## 2015-12-21 DIAGNOSIS — G4700 Insomnia, unspecified: Secondary | ICD-10-CM

## 2015-12-21 DIAGNOSIS — F329 Major depressive disorder, single episode, unspecified: Secondary | ICD-10-CM

## 2015-12-21 DIAGNOSIS — G629 Polyneuropathy, unspecified: Secondary | ICD-10-CM

## 2015-12-21 DIAGNOSIS — M80079D Age-related osteoporosis with current pathological fracture, unspecified ankle and foot, subsequent encounter for fracture with routine healing: Secondary | ICD-10-CM

## 2015-12-21 DIAGNOSIS — E785 Hyperlipidemia, unspecified: Secondary | ICD-10-CM | POA: Diagnosis not present

## 2015-12-21 DIAGNOSIS — I10 Essential (primary) hypertension: Secondary | ICD-10-CM

## 2015-12-21 DIAGNOSIS — E038 Other specified hypothyroidism: Secondary | ICD-10-CM

## 2015-12-21 DIAGNOSIS — G2581 Restless legs syndrome: Secondary | ICD-10-CM | POA: Diagnosis not present

## 2015-12-21 MED ORDER — ALPRAZOLAM 0.25 MG PO TABS: ORAL_TABLET | ORAL | Status: DC

## 2015-12-21 MED ORDER — LEVOTHYROXINE SODIUM 75 MCG PO TABS: 75.0000 ug | ORAL_TABLET | Freq: Every day | ORAL | Status: DC

## 2015-12-21 MED ORDER — TRAMADOL HCL 50 MG PO TABS: ORAL_TABLET | ORAL | Status: DC

## 2015-12-21 MED ORDER — ROPINIROLE HCL 0.5 MG PO TABS: 0.5000 mg | ORAL_TABLET | Freq: Every day | ORAL | Status: DC

## 2015-12-21 MED ORDER — SERTRALINE HCL 50 MG PO TABS: ORAL_TABLET | ORAL | Status: DC

## 2015-12-21 MED ORDER — ZOLPIDEM TARTRATE 10 MG PO TABS: 10.0000 mg | ORAL_TABLET | Freq: Every evening | ORAL | Status: DC | PRN

## 2015-12-21 MED ORDER — SIMVASTATIN 40 MG PO TABS: ORAL_TABLET | ORAL | Status: DC

## 2015-12-21 MED ORDER — BENAZEPRIL-HYDROCHLOROTHIAZIDE 20-12.5 MG PO TABS: 1.0000 | ORAL_TABLET | Freq: Every day | ORAL | Status: DC

## 2015-12-21 MED ORDER — METHYLPREDNISOLONE ACETATE 80 MG/ML IJ SUSP
80.0000 mg | Freq: Once | INTRAMUSCULAR | Status: AC
  Administered 2015-12-21: 80 mg via INTRAMUSCULAR

## 2015-12-21 MED ORDER — CETIRIZINE HCL 10 MG PO TABS: 10.0000 mg | ORAL_TABLET | Freq: Every day | ORAL | Status: DC

## 2015-12-21 MED ORDER — GABAPENTIN 100 MG PO CAPS: ORAL_CAPSULE | ORAL | Status: DC

## 2015-12-21 NOTE — Patient Instructions (Signed)
Health Maintenance, Female Adopting a healthy lifestyle and getting preventive care can go a long way to promote health and wellness. Talk with your health care provider about what schedule of regular examinations is right for you. This is a good chance for you to check in with your provider about disease prevention and staying healthy. In between checkups, there are plenty of things you can do on your own. Experts have done a lot of research about which lifestyle changes and preventive measures are most likely to keep you healthy. Ask your health care provider for more information. WEIGHT AND DIET  Eat a healthy diet  Be sure to include plenty of vegetables, fruits, low-fat dairy products, and lean protein.  Do not eat a lot of foods high in solid fats, added sugars, or salt.  Get regular exercise. This is one of the most important things you can do for your health.  Most adults should exercise for at least 150 minutes each week. The exercise should increase your heart rate and make you sweat (moderate-intensity exercise).  Most adults should also do strengthening exercises at least twice a week. This is in addition to the moderate-intensity exercise.  Maintain a healthy weight  Body mass index (BMI) is a measurement that can be used to identify possible weight problems. It estimates body fat based on height and weight. Your health care provider can help determine your BMI and help you achieve or maintain a healthy weight.  For females 20 years of age and older:   A BMI below 18.5 is considered underweight.  A BMI of 18.5 to 24.9 is normal.  A BMI of 25 to 29.9 is considered overweight.  A BMI of 30 and above is considered obese.  Watch levels of cholesterol and blood lipids  You should start having your blood tested for lipids and cholesterol at 71 years of age, then have this test every 5 years.  You may need to have your cholesterol levels checked more often if:  Your lipid  or cholesterol levels are high.  You are older than 71 years of age.  You are at high risk for heart disease.  CANCER SCREENING   Lung Cancer  Lung cancer screening is recommended for adults 55-80 years old who are at high risk for lung cancer because of a history of smoking.  A yearly low-dose CT scan of the lungs is recommended for people who:  Currently smoke.  Have quit within the past 15 years.  Have at least a 30-pack-year history of smoking. A pack year is smoking an average of one pack of cigarettes a day for 1 year.  Yearly screening should continue until it has been 15 years since you quit.  Yearly screening should stop if you develop a health problem that would prevent you from having lung cancer treatment.  Breast Cancer  Practice breast self-awareness. This means understanding how your breasts normally appear and feel.  It also means doing regular breast self-exams. Let your health care provider know about any changes, no matter how small.  If you are in your 20s or 30s, you should have a clinical breast exam (CBE) by a health care provider every 1-3 years as part of a regular health exam.  If you are 40 or older, have a CBE every year. Also consider having a breast X-ray (mammogram) every year.  If you have a family history of breast cancer, talk to your health care provider about genetic screening.  If you   are at high risk for breast cancer, talk to your health care provider about having an MRI and a mammogram every year.  Breast cancer gene (BRCA) assessment is recommended for women who have family members with BRCA-related cancers. BRCA-related cancers include:  Breast.  Ovarian.  Tubal.  Peritoneal cancers.  Results of the assessment will determine the need for genetic counseling and BRCA1 and BRCA2 testing. Cervical Cancer Your health care provider may recommend that you be screened regularly for cancer of the pelvic organs (ovaries, uterus, and  vagina). This screening involves a pelvic examination, including checking for microscopic changes to the surface of your cervix (Pap test). You may be encouraged to have this screening done every 3 years, beginning at age 21.  For women ages 30-65, health care providers may recommend pelvic exams and Pap testing every 3 years, or they may recommend the Pap and pelvic exam, combined with testing for human papilloma virus (HPV), every 5 years. Some types of HPV increase your risk of cervical cancer. Testing for HPV may also be done on women of any age with unclear Pap test results.  Other health care providers may not recommend any screening for nonpregnant women who are considered low risk for pelvic cancer and who do not have symptoms. Ask your health care provider if a screening pelvic exam is right for you.  If you have had past treatment for cervical cancer or a condition that could lead to cancer, you need Pap tests and screening for cancer for at least 20 years after your treatment. If Pap tests have been discontinued, your risk factors (such as having a new sexual partner) need to be reassessed to determine if screening should resume. Some women have medical problems that increase the chance of getting cervical cancer. In these cases, your health care provider may recommend more frequent screening and Pap tests. Colorectal Cancer  This type of cancer can be detected and often prevented.  Routine colorectal cancer screening usually begins at 71 years of age and continues through 71 years of age.  Your health care provider may recommend screening at an earlier age if you have risk factors for colon cancer.  Your health care provider may also recommend using home test kits to check for hidden blood in the stool.  A small camera at the end of a tube can be used to examine your colon directly (sigmoidoscopy or colonoscopy). This is done to check for the earliest forms of colorectal  cancer.  Routine screening usually begins at age 50.  Direct examination of the colon should be repeated every 5-10 years through 71 years of age. However, you may need to be screened more often if early forms of precancerous polyps or small growths are found. Skin Cancer  Check your skin from head to toe regularly.  Tell your health care provider about any new moles or changes in moles, especially if there is a change in a mole's shape or color.  Also tell your health care provider if you have a mole that is larger than the size of a pencil eraser.  Always use sunscreen. Apply sunscreen liberally and repeatedly throughout the day.  Protect yourself by wearing long sleeves, pants, a wide-brimmed hat, and sunglasses whenever you are outside. HEART DISEASE, DIABETES, AND HIGH BLOOD PRESSURE   High blood pressure causes heart disease and increases the risk of stroke. High blood pressure is more likely to develop in:  People who have blood pressure in the high end   of the normal range (130-139/85-89 mm Hg).  People who are overweight or obese.  People who are African American.  If you are 38-23 years of age, have your blood pressure checked every 3-5 years. If you are 61 years of age or older, have your blood pressure checked every year. You should have your blood pressure measured twice--once when you are at a hospital or clinic, and once when you are not at a hospital or clinic. Record the average of the two measurements. To check your blood pressure when you are not at a hospital or clinic, you can use:  An automated blood pressure machine at a pharmacy.  A home blood pressure monitor.  If you are between 45 years and 39 years old, ask your health care provider if you should take aspirin to prevent strokes.  Have regular diabetes screenings. This involves taking a blood sample to check your fasting blood sugar level.  If you are at a normal weight and have a low risk for diabetes,  have this test once every three years after 71 years of age.  If you are overweight and have a high risk for diabetes, consider being tested at a younger age or more often. PREVENTING INFECTION  Hepatitis B  If you have a higher risk for hepatitis B, you should be screened for this virus. You are considered at high risk for hepatitis B if:  You were born in a country where hepatitis B is common. Ask your health care provider which countries are considered high risk.  Your parents were born in a high-risk country, and you have not been immunized against hepatitis B (hepatitis B vaccine).  You have HIV or AIDS.  You use needles to inject street drugs.  You live with someone who has hepatitis B.  You have had sex with someone who has hepatitis B.  You get hemodialysis treatment.  You take certain medicines for conditions, including cancer, organ transplantation, and autoimmune conditions. Hepatitis C  Blood testing is recommended for:  Everyone born from [birth year] through 1966.  Anyone with known risk factors for hepatitis C. Sexually transmitted infections (STIs)  You should be screened for sexually transmitted infections (STIs) including gonorrhea and chlamydia if:  You are sexually active and are younger than 71 years of age.  You are older than 71 years of age and your health care provider tells you that you are at risk for this type of infection.  Your sexual activity has changed since you were last screened and you are at an increased risk for chlamydia or gonorrhea. Ask your health care provider if you are at risk.  If you do not have HIV, but are at risk, it may be recommended that you take a prescription medicine daily to prevent HIV infection. This is called pre-exposure prophylaxis (PrEP). You are considered at risk if:  You are sexually active and do not regularly use condoms or know the HIV status of your partner(s).  You take drugs by injection.  You are sexually  active with a partner who has HIV. Talk with your health care provider about whether you are at high risk of being infected with HIV. If you choose to begin PrEP, you should first be tested for HIV. You should then be tested every 3 months for as long as you are taking PrEP.  PREGNANCY   If you are premenopausal and you may become pregnant, ask your health care provider about preconception counseling.  If you may  become pregnant, take 400 to 800 micrograms (mcg) of folic acid every day.  If you want to prevent pregnancy, talk to your health care provider about birth control (contraception). OSTEOPOROSIS AND MENOPAUSE   Osteoporosis is a disease in which the bones lose minerals and strength with aging. This can result in serious bone fractures. Your risk for osteoporosis can be identified using a bone density scan.  If you are 61 years of age or older, or if you are at risk for osteoporosis and fractures, ask your health care provider if you should be screened.  Ask your health care provider whether you should take a calcium or vitamin D supplement to lower your risk for osteoporosis.  Menopause may have certain physical symptoms and risks.  Hormone replacement therapy may reduce some of these symptoms and risks. Talk to your health care provider about whether hormone replacement therapy is right for you.  HOME CARE INSTRUCTIONS   Schedule regular health, dental, and eye exams.  Stay current with your immunizations.   Do not use any tobacco products including cigarettes, chewing tobacco, or electronic cigarettes.  If you are pregnant, do not drink alcohol.  If you are breastfeeding, limit how much and how often you drink alcohol.  Limit alcohol intake to no more than 1 drink per day for nonpregnant women. One drink equals 12 ounces of beer, 5 ounces of wine, or 1 ounces of hard liquor.  Do not use street drugs.  Do not share needles.  Ask your health care provider for help if  you need support or information about quitting drugs.  Tell your health care provider if you often feel depressed.  Tell your health care provider if you have ever been abused or do not feel safe at home.   This information is not intended to replace advice given to you by your health care provider. Make sure you discuss any questions you have with your health care provider.   Document Released: 03/14/2011 Document Revised: 09/19/2014 Document Reviewed: 07/31/2013 Elsevier Interactive Patient Education [employer].

## 2015-12-21 NOTE — Addendum Note (Signed)
Addended by: Chevis Pretty on: 12/21/2015 11:49 AM   Modules accepted: Orders

## 2015-12-21 NOTE — Addendum Note (Signed)
Addended by: Chevis Pretty on: 12/21/2015 11:52 AM   Modules accepted: Orders

## 2015-12-21 NOTE — Progress Notes (Addendum)
Subjective:    Patient ID: Phyllis Walker, female    DOB: 1946-01-08, 70 y.o.   MRN: [id number]  Patient here today for follow up of chronic medical problems.  Outpatient Encounter Prescriptions as of 12/21/2015  Medication Sig  . ALPRAZolam (XANAX) 0.25 MG tablet TAKE  (1)  TABLET TWICE A DAY AS NEEDED.  [name] benazepril-hydrochlorthiazide (LOTENSIN HCT) 20-12.5 MG tablet Take 1 tablet by mouth daily.  . cetirizine (ZYRTEC) 10 MG tablet Take 10 mg by mouth daily.  [name] gabapentin (NEURONTIN) 100 MG capsule TAKE (1) CAPSULE THREE TIMES DAILY.  [name] levothyroxine (SYNTHROID, LEVOTHROID) 75 MCG tablet Take 1 tablet (75 mcg total) by mouth daily.  [name] rOPINIRole (REQUIP) 0.5 MG tablet Take 1 tablet (0.5 mg total) by mouth at bedtime.  . sertraline (ZOLOFT) 50 MG tablet Take 1 tablet by mouth  daily  . simvastatin (ZOCOR) 40 MG tablet Take 1 tablet by mouth at  bedtime  . traMADol (ULTRAM) 50 MG tablet TAKE  (1)  TABLET TWICE A DAY AS NEEDED.  [name] zolpidem (AMBIEN) 10 MG tablet TAKE 1 TABLET AT BEDTIME AS NEEDED FOR SLEEP   No facility-administered encounter medications on file as of 12/21/2015.    * C/o right hip pain- worse at night- hurts to lay on it.  Hyperlipidemia This is a chronic problem. The current episode started more than 1 year ago. The problem is controlled. Recent lipid tests were reviewed and are normal. Exacerbating diseases include hypothyroidism. She has no history of diabetes. Pertinent negatives include no chest pain or shortness of breath. She is currently on no antihyperlipidemic treatment. Compliance problems include adherence to diet.  Risk factors for coronary artery disease include dyslipidemia, hypertension and post-menopausal.  Hypertension This is a chronic problem. The current episode started more than 1 year ago. The problem is controlled. Pertinent negatives include no chest pain, headaches, neck pain, palpitations or shortness of breath. Risk factors for coronary artery  disease include dyslipidemia and post-menopausal state. Past treatments include diuretics and ACE inhibitors. The current treatment provides moderate improvement. There are no compliance problems.  Hypertensive end-organ damage includes a thyroid problem.  Thyroid Problem Presents for follow-up visit. Patient reports no diarrhea, menstrual problem, palpitations or visual change. The symptoms have been stable. Her past medical history is significant for hyperlipidemia. There is no history of diabetes.  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 She also takes tramadol on occasion. Depression/Gad Zoloft and xanax- combination working well. RLS Patient on requip which is working great. Insomnia Currently on ambien- working well- feels rested in mornings Hx anemia-  Have not checked labs in awhile- o c/o fatigue Osteoporosis with past fracture Currently on no meds- no c/o back pain    Review of Systems  Constitutional: Negative.   HENT: Negative.   Respiratory: Negative for shortness of breath.   Cardiovascular: Negative for chest pain and palpitations.  Gastrointestinal: Negative for diarrhea.  Genitourinary: Negative for menstrual problem.  Musculoskeletal: Negative for neck pain.  Neurological: Negative for headaches.  All other systems reviewed and are negative.      Objective:   Physical Exam  Constitutional: She is oriented to person, place, and time. She appears well-developed and well-nourished.  HENT:  Nose: Nose normal.  Mouth/Throat: Oropharynx is clear and moist.  Eyes: EOM are normal.  Neck: Trachea normal, normal range of motion and full passive range of motion without pain. Neck  supple. No JVD present. Carotid bruit is not present. No thyromegaly present.  Cardiovascular: Normal rate, regular rhythm, normal heart sounds and intact distal pulses.  Exam reveals no gallop and no friction rub.     No murmur heard. Pulmonary/Chest: Effort normal and breath sounds normal.  Abdominal: Soft. Bowel sounds are normal. She exhibits no distension and no mass. There is no tenderness.  Musculoskeletal: Normal range of motion.  FROM of right hip without increasing pain  Lymphadenopathy:    She has no cervical adenopathy.  Neurological: She is alert and oriented to person, place, and time. She has normal reflexes.  Skin: Skin is warm and dry.  Psychiatric: She has a normal mood and affect. Her behavior is normal. Judgment and thought content normal.   BP 116/71 mmHg  Pulse 78  Temp(Src) 96.7 F (35.9 C) (Oral)  Ht _0  (1.6 m)  Wt 178 lb (80.74 kg)  BMI 31.54 kg/m2       Assessment & Plan:  1. Hypertension, benign essential, goal below 140/90 Do not add salt to diet - benazepril-hydrochlorthiazide (LOTENSIN HCT) 20-12.5 MG tablet; Take 1 tablet by mouth daily.  Dispense: 90 tablet; Refill: 1 - CMP14+EGFR  2. Hypothyroidism due to acquired atrophy of thyroid - levothyroxine (SYNTHROID, LEVOTHROID) 75 MCG tablet; Take 1 tablet (75 mcg total) by mouth daily.  Dispense: 90 tablet; Refill: 2 - Thyroid Panel With TSH  3. Neuropathy (HCC) - gabapentin (NEURONTIN) 100 MG capsule; TAKE (1) CAPSULE THREE TIMES DAILY.  Dispense: 90 capsule; Refill: 5  4. GAD (generalized anxiety disorder) Stress management - ALPRAZolam (XANAX) 0.25 MG tablet; TAKE  (1)  TABLET TWICE A DAY AS NEEDED.  Dispense: 60 tablet; Refill: 1  5. Depression - sertraline (ZOLOFT) 50 MG tablet; Take 1 tablet by mouth  daily  Dispense: 90 tablet; Refill: 1  6. Restless leg syndrome Keep legs warm at night - rOPINIRole (REQUIP) 0.5 MG tablet; Take 1 tablet (0.5 mg total) by mouth at bedtime.  Dispense: 90 tablet; Refill: 1  7. Hyperlipidemia with target LDL less than 100 Low fat diet - simvastatin (ZOCOR) 40 MG tablet; Take 1 tablet by mouth at  bedtime  Dispense: 90 tablet; Refill: 1 - Lipid panel  8.  Insomnia Bedtime ritual - zolpidem (AMBIEN) 10 MG tablet; Take 1 tablet (10 mg total) by mouth at bedtime as needed. for sleep  Dispense: 30 tablet; Refill: 1  9. Anemia, unspecified anemia type Labs pending  10. Osteoporosis with pathological fracture of ankle and foot, unspecified laterality, with routine healing, subsequent encounter Weigh bearing exerecises - traMADol (ULTRAM) 50 MG tablet; TAKE  (1)  TABLET TWICE A DAY AS NEEDED.  Dispense: 60 tablet; Refill: 1  11. BMI 31.0-31.9,adult Discussed diet and exercise for person with BMI >25 Will recheck weight in 3-6 months  12. Other seasonal allergic rhinitis - cetirizine (ZYRTEC) 10 MG tablet; Take 1 tablet (10 mg total) by mouth daily.  Dispense: 90 tablet; Refill: 1  13. Right hip pain - depomedrol 41m IM now   Labs pending Health maintenance reviewed Diet and exercise encouraged Continue all meds Follow up  In 3 months   [place] FNP

## 2015-12-22 LAB — CMP14+EGFR
A/G RATIO: 1.8 (ref 1.2–2.2)
ALBUMIN: 4.3 g/dL (ref 3.5–4.8)
ALT: 13 IU/L (ref 0–32)
AST: 19 IU/L (ref 0–40)
Alkaline Phosphatase: 92 IU/L (ref 39–117)
BILIRUBIN TOTAL: 0.3 mg/dL (ref 0.0–1.2)
BUN / CREAT RATIO: 24 (ref 12–28)
BUN: 19 mg/dL (ref 8–27)
CALCIUM: 9 mg/dL (ref 8.7–10.3)
CHLORIDE: 96 mmol/L (ref 96–106)
CO2: 26 mmol/L (ref 18–29)
Creatinine, Ser: 0.79 mg/dL (ref 0.57–1.00)
GFR, EST AFRICAN AMERICAN: 88 mL/min/{1.73_m2} (ref >59)
GFR, EST NON AFRICAN AMERICAN: 76 mL/min/{1.73_m2} (ref >59)
GLUCOSE: 87 mg/dL (ref 65–99)
Globulin, Total: 2.4 g/dL (ref 1.5–4.5)
Potassium: 4.3 mmol/L (ref 3.5–5.2)
Sodium: 140 mmol/L (ref 134–144)
Total Protein: 6.7 g/dL (ref 6.0–8.5)

## 2015-12-22 LAB — LIPID PANEL
CHOL/HDL RATIO: 2.7 ratio (ref 0.0–4.4)
Cholesterol, Total: 171 mg/dL (ref 100–199)
HDL: 63 mg/dL (ref >39)
LDL Calculated: 85 mg/dL (ref 0–99)
Triglycerides: 113 mg/dL (ref 0–149)
VLDL CHOLESTEROL CAL: 23 mg/dL (ref 5–40)

## 2015-12-22 LAB — THYROID PANEL WITH TSH
FREE THYROXINE INDEX: 2.7 (ref 1.2–4.9)
T3 Uptake Ratio: 25 % (ref 24–39)
T4 TOTAL: 10.9 ug/dL (ref 4.5–12.0)
TSH: 1.47 u[IU]/mL (ref 0.450–4.500)

## 2015-12-24 ENCOUNTER — Other Ambulatory Visit: Payer: Self-pay | Admitting: Nurse Practitioner

## 2015-12-24 NOTE — Telephone Encounter (Signed)
Ultram rx ready for pick up  

## 2015-12-24 NOTE — Telephone Encounter (Signed)
Pt notified RX is ready for pick up RX to front for pt pick up 

## 2015-12-24 NOTE — Telephone Encounter (Signed)
Last seen 12/21/15  MMM  If approved print

## 2016-01-08 DIAGNOSIS — M1711 Unilateral primary osteoarthritis, right knee: Secondary | ICD-10-CM | POA: Diagnosis not present

## 2016-01-15 DIAGNOSIS — L570 Actinic keratosis: Secondary | ICD-10-CM | POA: Diagnosis not present

## 2016-01-15 DIAGNOSIS — L905 Scar conditions and fibrosis of skin: Secondary | ICD-10-CM | POA: Diagnosis not present

## 2016-03-14 ENCOUNTER — Other Ambulatory Visit: Payer: Self-pay | Admitting: Nurse Practitioner

## 2016-03-14 DIAGNOSIS — Z1231 Encounter for screening mammogram for malignant neoplasm of breast: Secondary | ICD-10-CM

## 2016-03-18 ENCOUNTER — Ambulatory Visit (INDEPENDENT_AMBULATORY_CARE_PROVIDER_SITE_OTHER): Payer: PPO | Admitting: Women's Health

## 2016-03-18 ENCOUNTER — Encounter: Payer: Self-pay | Admitting: Women's Health

## 2016-03-18 VITALS — BP 106/68 | Ht 64.25 in | Wt 180.4 lb

## 2016-03-18 DIAGNOSIS — Z01419 Encounter for gynecological examination (general) (routine) without abnormal findings: Secondary | ICD-10-CM

## 2016-03-18 NOTE — Progress Notes (Signed)
Phyllis Walker 06-22-1946 [id number]    History:    Presents for breast and pelvic exam with no complaints. Postmenopausal on no HRT with no bleeding. Reports normal DEXA at primary care. Hypertension/hypercholesterolemia/hyperthyroidism managed by primary care. Current on vaccines, has received both the Pneumovax and Zostavax. Planning right knee replacement in November 2017. 2010 negative colonoscopy.  Past medical history, past surgical history, family history and social history were all reviewed and documented in the EPIC chart. 2 children both doing well. Moved to  retirement community is not enjoying and plans to move out.  ROS:  A ROS was performed and pertinent positives and negatives are included.  Exam:  Filed Vitals:   03/18/16 1157  BP: 106/68    General appearance:  Normal Thyroid:  Symmetrical, normal in size, without palpable masses or nodularity. Respiratory  Auscultation:  Clear without wheezing or rhonchi Cardiovascular  Auscultation:  Regular rate, without rubs, murmurs or gallops  Edema/varicosities:  Not grossly evident Abdominal  Soft,nontender, without masses, guarding or rebound.  Liver/spleen:  No organomegaly noted  Hernia:  None appreciated  Skin  Inspection:  Grossly normal   Breasts: Examined lying and sitting.     Right: Without masses, retractions, discharge or axillary adenopathy.     Left: Without masses, retractions, discharge or axillary adenopathy. Gentitourinary   Inguinal/mons:  Normal without inguinal adenopathy  External genitalia:  Normal  BUS/Urethra/Skene's glands:  Normal  Vagina:  Normal  Cervix:  Normal  Uterus:  normal in size, shape and contour.  Midline and mobile  Adnexa/parametria:     Rt: Without masses or tenderness.   Lt: Without masses or tenderness.  Anus and perineum: Normal  Digital rectal exam: Normal sphincter tone without palpated masses or tenderness  Assessment/Plan:  70 y.o. DW F G2 P2 for breast and pelvic  exam with no complaints.  Postmenopausal/no HRT/no bleeding/not sexually active. Hypertension/hypercholesterolemia/hyperthyroidism/anxiety and depression-primary care manages labs and meds Obesity  Plan: SBE's, continue annual screening mammogram has scheduled next week. Reviewed home safety, fall prevention and importance of regular weightbearing exercise. Yoga encouraged. Encouraged to decrease calories and increase exercise for weight loss.    Huel Cote [facility], 12:33 PM 03/18/2016

## 2016-03-18 NOTE — Patient Instructions (Signed)

## 2016-03-21 ENCOUNTER — Ambulatory Visit (INDEPENDENT_AMBULATORY_CARE_PROVIDER_SITE_OTHER): Payer: PPO | Admitting: Nurse Practitioner

## 2016-03-21 ENCOUNTER — Encounter: Payer: Self-pay | Admitting: Nurse Practitioner

## 2016-03-21 VITALS — BP 109/60 | HR 69 | Temp 96.9°F | Ht 64.0 in | Wt 181.0 lb

## 2016-03-21 DIAGNOSIS — G4700 Insomnia, unspecified: Secondary | ICD-10-CM

## 2016-03-21 DIAGNOSIS — I10 Essential (primary) hypertension: Secondary | ICD-10-CM

## 2016-03-21 DIAGNOSIS — E038 Other specified hypothyroidism: Secondary | ICD-10-CM

## 2016-03-21 DIAGNOSIS — G629 Polyneuropathy, unspecified: Secondary | ICD-10-CM

## 2016-03-21 DIAGNOSIS — F329 Major depressive disorder, single episode, unspecified: Secondary | ICD-10-CM | POA: Diagnosis not present

## 2016-03-21 DIAGNOSIS — E785 Hyperlipidemia, unspecified: Secondary | ICD-10-CM

## 2016-03-21 DIAGNOSIS — Z6831 Body mass index (BMI) 31.0-31.9, adult: Secondary | ICD-10-CM | POA: Diagnosis not present

## 2016-03-21 DIAGNOSIS — F411 Generalized anxiety disorder: Secondary | ICD-10-CM

## 2016-03-21 DIAGNOSIS — G2581 Restless legs syndrome: Secondary | ICD-10-CM | POA: Diagnosis not present

## 2016-03-21 DIAGNOSIS — D649 Anemia, unspecified: Secondary | ICD-10-CM

## 2016-03-21 DIAGNOSIS — E034 Atrophy of thyroid (acquired): Secondary | ICD-10-CM

## 2016-03-21 DIAGNOSIS — F32A Depression, unspecified: Secondary | ICD-10-CM

## 2016-03-21 MED ORDER — SERTRALINE HCL 50 MG PO TABS: ORAL_TABLET | ORAL | Status: DC

## 2016-03-21 MED ORDER — BENAZEPRIL-HYDROCHLOROTHIAZIDE 20-12.5 MG PO TABS: 1.0000 | ORAL_TABLET | Freq: Every day | ORAL | Status: DC

## 2016-03-21 MED ORDER — TRAMADOL HCL 50 MG PO TABS: 50.0000 mg | ORAL_TABLET | Freq: Two times a day (BID) | ORAL | Status: DC

## 2016-03-21 MED ORDER — GABAPENTIN 100 MG PO CAPS: ORAL_CAPSULE | ORAL | Status: DC

## 2016-03-21 MED ORDER — SIMVASTATIN 40 MG PO TABS: ORAL_TABLET | ORAL | Status: DC

## 2016-03-21 MED ORDER — ROPINIROLE HCL 0.5 MG PO TABS: 0.5000 mg | ORAL_TABLET | Freq: Every day | ORAL | Status: DC

## 2016-03-21 MED ORDER — ZOLPIDEM TARTRATE 10 MG PO TABS: 10.0000 mg | ORAL_TABLET | Freq: Every evening | ORAL | Status: DC | PRN

## 2016-03-21 MED ORDER — ALPRAZOLAM 0.25 MG PO TABS: ORAL_TABLET | ORAL | Status: DC

## 2016-03-21 MED ORDER — LEVOTHYROXINE SODIUM 75 MCG PO TABS: 75.0000 ug | ORAL_TABLET | Freq: Every day | ORAL | Status: DC

## 2016-03-21 NOTE — Patient Instructions (Signed)
Health Maintenance, Female Adopting a healthy lifestyle and getting preventive care can go a long way to promote health and wellness. Talk with your health care provider about what schedule of regular examinations is right for you. This is a good chance for you to check in with your provider about disease prevention and staying healthy. In between checkups, there are plenty of things you can do on your own. Experts have done a lot of research about which lifestyle changes and preventive measures are most likely to keep you healthy. Ask your health care provider for more information. WEIGHT AND DIET  Eat a healthy diet  Be sure to include plenty of vegetables, fruits, low-fat dairy products, and lean protein.  Do not eat a lot of foods high in solid fats, added sugars, or salt.  Get regular exercise. This is one of the most important things you can do for your health.  Most adults should exercise for at least 150 minutes each week. The exercise should increase your heart rate and make you sweat (moderate-intensity exercise).  Most adults should also do strengthening exercises at least twice a week. This is in addition to the moderate-intensity exercise.  Maintain a healthy weight  Body mass index (BMI) is a measurement that can be used to identify possible weight problems. It estimates body fat based on height and weight. Your health care provider can help determine your BMI and help you achieve or maintain a healthy weight.  For females 20 years of age and older:   A BMI below 18.5 is considered underweight.  A BMI of 18.5 to 24.9 is normal.  A BMI of 25 to 29.9 is considered overweight.  A BMI of 30 and above is considered obese.  Watch levels of cholesterol and blood lipids  You should start having your blood tested for lipids and cholesterol at 71 years of age, then have this test every 5 years.  You may need to have your cholesterol levels checked more often if:  Your lipid  or cholesterol levels are high.  You are older than 71 years of age.  You are at high risk for heart disease.  CANCER SCREENING   Lung Cancer  Lung cancer screening is recommended for adults 55-80 years old who are at high risk for lung cancer because of a history of smoking.  A yearly low-dose CT scan of the lungs is recommended for people who:  Currently smoke.  Have quit within the past 15 years.  Have at least a 30-pack-year history of smoking. A pack year is smoking an average of one pack of cigarettes a day for 1 year.  Yearly screening should continue until it has been 15 years since you quit.  Yearly screening should stop if you develop a health problem that would prevent you from having lung cancer treatment.  Breast Cancer  Practice breast self-awareness. This means understanding how your breasts normally appear and feel.  It also means doing regular breast self-exams. Let your health care provider know about any changes, no matter how small.  If you are in your 20s or 30s, you should have a clinical breast exam (CBE) by a health care provider every 1-3 years as part of a regular health exam.  If you are 40 or older, have a CBE every year. Also consider having a breast X-ray (mammogram) every year.  If you have a family history of breast cancer, talk to your health care provider about genetic screening.  If you   are at high risk for breast cancer, talk to your health care provider about having an MRI and a mammogram every year.  Breast cancer gene (BRCA) assessment is recommended for women who have family members with BRCA-related cancers. BRCA-related cancers include:  Breast.  Ovarian.  Tubal.  Peritoneal cancers.  Results of the assessment will determine the need for genetic counseling and BRCA1 and BRCA2 testing. Cervical Cancer Your health care provider may recommend that you be screened regularly for cancer of the pelvic organs (ovaries, uterus, and  vagina). This screening involves a pelvic examination, including checking for microscopic changes to the surface of your cervix (Pap test). You may be encouraged to have this screening done every 3 years, beginning at age 21.  For women ages 30-65, health care providers may recommend pelvic exams and Pap testing every 3 years, or they may recommend the Pap and pelvic exam, combined with testing for human papilloma virus (HPV), every 5 years. Some types of HPV increase your risk of cervical cancer. Testing for HPV may also be done on women of any age with unclear Pap test results.  Other health care providers may not recommend any screening for nonpregnant women who are considered low risk for pelvic cancer and who do not have symptoms. Ask your health care provider if a screening pelvic exam is right for you.  If you have had past treatment for cervical cancer or a condition that could lead to cancer, you need Pap tests and screening for cancer for at least 20 years after your treatment. If Pap tests have been discontinued, your risk factors (such as having a new sexual partner) need to be reassessed to determine if screening should resume. Some women have medical problems that increase the chance of getting cervical cancer. In these cases, your health care provider may recommend more frequent screening and Pap tests. Colorectal Cancer  This type of cancer can be detected and often prevented.  Routine colorectal cancer screening usually begins at 71 years of age and continues through 71 years of age.  Your health care provider may recommend screening at an earlier age if you have risk factors for colon cancer.  Your health care provider may also recommend using home test kits to check for hidden blood in the stool.  A small camera at the end of a tube can be used to examine your colon directly (sigmoidoscopy or colonoscopy). This is done to check for the earliest forms of colorectal  cancer.  Routine screening usually begins at age 50.  Direct examination of the colon should be repeated every 5-10 years through 71 years of age. However, you may need to be screened more often if early forms of precancerous polyps or small growths are found. Skin Cancer  Check your skin from head to toe regularly.  Tell your health care provider about any new moles or changes in moles, especially if there is a change in a mole's shape or color.  Also tell your health care provider if you have a mole that is larger than the size of a pencil eraser.  Always use sunscreen. Apply sunscreen liberally and repeatedly throughout the day.  Protect yourself by wearing long sleeves, pants, a wide-brimmed hat, and sunglasses whenever you are outside. HEART DISEASE, DIABETES, AND HIGH BLOOD PRESSURE   High blood pressure causes heart disease and increases the risk of stroke. High blood pressure is more likely to develop in:  People who have blood pressure in the high end   of the normal range (130-139/85-89 mm Hg).  People who are overweight or obese.  People who are African American.  If you are 38-23 years of age, have your blood pressure checked every 3-5 years. If you are 61 years of age or older, have your blood pressure checked every year. You should have your blood pressure measured twice--once when you are at a hospital or clinic, and once when you are not at a hospital or clinic. Record the average of the two measurements. To check your blood pressure when you are not at a hospital or clinic, you can use:  An automated blood pressure machine at a pharmacy.  A home blood pressure monitor.  If you are between 45 years and 39 years old, ask your health care provider if you should take aspirin to prevent strokes.  Have regular diabetes screenings. This involves taking a blood sample to check your fasting blood sugar level.  If you are at a normal weight and have a low risk for diabetes,  have this test once every three years after 71 years of age.  If you are overweight and have a high risk for diabetes, consider being tested at a younger age or more often. PREVENTING INFECTION  Hepatitis B  If you have a higher risk for hepatitis B, you should be screened for this virus. You are considered at high risk for hepatitis B if:  You were born in a country where hepatitis B is common. Ask your health care provider which countries are considered high risk.  Your parents were born in a high-risk country, and you have not been immunized against hepatitis B (hepatitis B vaccine).  You have HIV or AIDS.  You use needles to inject street drugs.  You live with someone who has hepatitis B.  You have had sex with someone who has hepatitis B.  You get hemodialysis treatment.  You take certain medicines for conditions, including cancer, organ transplantation, and autoimmune conditions. Hepatitis C  Blood testing is recommended for:  Everyone born from [birth year] through 1966.  Anyone with known risk factors for hepatitis C. Sexually transmitted infections (STIs)  You should be screened for sexually transmitted infections (STIs) including gonorrhea and chlamydia if:  You are sexually active and are younger than 71 years of age.  You are older than 71 years of age and your health care provider tells you that you are at risk for this type of infection.  Your sexual activity has changed since you were last screened and you are at an increased risk for chlamydia or gonorrhea. Ask your health care provider if you are at risk.  If you do not have HIV, but are at risk, it may be recommended that you take a prescription medicine daily to prevent HIV infection. This is called pre-exposure prophylaxis (PrEP). You are considered at risk if:  You are sexually active and do not regularly use condoms or know the HIV status of your partner(s).  You take drugs by injection.  You are sexually  active with a partner who has HIV. Talk with your health care provider about whether you are at high risk of being infected with HIV. If you choose to begin PrEP, you should first be tested for HIV. You should then be tested every 3 months for as long as you are taking PrEP.  PREGNANCY   If you are premenopausal and you may become pregnant, ask your health care provider about preconception counseling.  If you may  become pregnant, take 400 to 800 micrograms (mcg) of folic acid every day.  If you want to prevent pregnancy, talk to your health care provider about birth control (contraception). OSTEOPOROSIS AND MENOPAUSE   Osteoporosis is a disease in which the bones lose minerals and strength with aging. This can result in serious bone fractures. Your risk for osteoporosis can be identified using a bone density scan.  If you are 61 years of age or older, or if you are at risk for osteoporosis and fractures, ask your health care provider if you should be screened.  Ask your health care provider whether you should take a calcium or vitamin D supplement to lower your risk for osteoporosis.  Menopause may have certain physical symptoms and risks.  Hormone replacement therapy may reduce some of these symptoms and risks. Talk to your health care provider about whether hormone replacement therapy is right for you.  HOME CARE INSTRUCTIONS   Schedule regular health, dental, and eye exams.  Stay current with your immunizations.   Do not use any tobacco products including cigarettes, chewing tobacco, or electronic cigarettes.  If you are pregnant, do not drink alcohol.  If you are breastfeeding, limit how much and how often you drink alcohol.  Limit alcohol intake to no more than 1 drink per day for nonpregnant women. One drink equals 12 ounces of beer, 5 ounces of wine, or 1 ounces of hard liquor.  Do not use street drugs.  Do not share needles.  Ask your health care provider for help if  you need support or information about quitting drugs.  Tell your health care provider if you often feel depressed.  Tell your health care provider if you have ever been abused or do not feel safe at home.   This information is not intended to replace advice given to you by your health care provider. Make sure you discuss any questions you have with your health care provider.   Document Released: 03/14/2011 Document Revised: 09/19/2014 Document Reviewed: 07/31/2013 Elsevier Interactive Patient Education [employer].

## 2016-03-21 NOTE — Progress Notes (Signed)
Subjective:    Patient ID: Phyllis Walker, female    DOB: 22-Oct-1945, 70 y.o.   MRN: [id number]  Patient here today for follow up of chronic medical problems.  Outpatient Encounter Prescriptions as of 03/21/2016  Medication Sig  . ALPRAZolam (XANAX) 0.25 MG tablet TAKE  (1)  TABLET TWICE A DAY AS NEEDED.  [name] benazepril-hydrochlorthiazide (LOTENSIN HCT) 20-12.5 MG tablet Take 1 tablet by mouth daily.  [name] gabapentin (NEURONTIN) 100 MG capsule TAKE (1) CAPSULE THREE TIMES DAILY.  [name] levothyroxine (SYNTHROID, LEVOTHROID) 75 MCG tablet Take 1 tablet (75 mcg total) by mouth daily.  [name] rOPINIRole (REQUIP) 0.5 MG tablet Take 1 tablet (0.5 mg total) by mouth at bedtime.  . sertraline (ZOLOFT) 50 MG tablet Take 1 tablet by mouth  daily  . simvastatin (ZOCOR) 40 MG tablet Take 1 tablet by mouth at  bedtime  . traMADol (ULTRAM) 50 MG tablet TAKE  (1)  TABLET TWICE A DAY.  [name] zolpidem (AMBIEN) 10 MG tablet Take 1 tablet (10 mg total) by mouth at bedtime as needed. for sleep  . [DISCONTINUED] cetirizine (ZYRTEC) 10 MG tablet Take 1 tablet (10 mg total) by mouth daily.   No facility-administered encounter medications on file as of 03/21/2016.    Hyperlipidemia This is a chronic problem. The current episode started more than 1 year ago. The problem is controlled. Recent lipid tests were reviewed and are normal. Exacerbating diseases include hypothyroidism. She has no history of diabetes. Pertinent negatives include no chest pain or shortness of breath. She is currently on no antihyperlipidemic treatment. Compliance problems include adherence to diet.  Risk factors for coronary artery disease include dyslipidemia, hypertension and post-menopausal.  Hypertension This is a chronic problem. The current episode started more than 1 year ago. The problem is controlled. Pertinent negatives include no chest pain, headaches, neck pain, palpitations or shortness of breath. Risk factors for coronary artery disease include  dyslipidemia and post-menopausal state. Past treatments include diuretics and ACE inhibitors. The current treatment provides moderate improvement. There are no compliance problems.  Hypertensive end-organ damage includes a thyroid problem.  Thyroid Problem Presents for follow-up visit. Patient reports no diarrhea, menstrual problem, palpitations or visual change. The symptoms have been stable. Her past medical history is significant for hyperlipidemia. There is no history of diabetes.  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 She also takes tramadol on occasion. Depression/Gad Zoloft and xanax- combination working well. RLS Patient on requip which is working great. Insomnia Currently on ambien- working well- feels rested in mornings Hx anemia-  Have not checked labs in awhile- o c/o fatigue Osteoporosis with past fracture Currently on no meds- no c/o back pain    Review of Systems  Constitutional: Negative.   HENT: Negative.   Respiratory: Negative for shortness of breath.   Cardiovascular: Negative for chest pain and palpitations.  Gastrointestinal: Negative for diarrhea.  Genitourinary: Negative for menstrual problem.  Musculoskeletal: Negative for neck pain.  Neurological: Negative for headaches.  All other systems reviewed and are negative.      Objective:   Physical Exam  Constitutional: She is oriented to person, place, and time. She appears well-developed and well-nourished.  HENT:  Nose: Nose normal.  Mouth/Throat: Oropharynx is clear and moist.  Eyes: EOM are normal.  Neck: Trachea normal, normal range of motion and full passive range of motion without pain. Neck supple. No JVD present. Carotid bruit is  not present. No thyromegaly present.  Cardiovascular: Normal rate, regular rhythm, normal heart sounds and intact distal pulses.  Exam reveals no gallop and no friction rub.   No murmur  heard. Pulmonary/Chest: Effort normal and breath sounds normal.  Abdominal: Soft. Bowel sounds are normal. She exhibits no distension and no mass. There is no tenderness.  Musculoskeletal: Normal range of motion.  FROM of right hip without increasing pain  Lymphadenopathy:    She has no cervical adenopathy.  Neurological: She is alert and oriented to person, place, and time. She has normal reflexes.  Skin: Skin is warm and dry.  Psychiatric: She has a normal mood and affect. Her behavior is normal. Judgment and thought content normal.   BP 109/60 mmHg  Pulse 69  Temp(Src) 96.9 F (36.1 C) (Oral)  Ht '5\' 4"'  (1.626 m)  Wt 181 lb (82.101 kg)  BMI 31.05 kg/m2       Assessment & Plan:  1. Hypertension, benign essential, goal below 140/90 Do not add salt to diet - benazepril-hydrochlorthiazide (LOTENSIN HCT) 20-12.5 MG tablet; Take 1 tablet by mouth daily.  Dispense: 90 tablet; Refill: 1 - CMP14+EGFR  2. Hypothyroidism due to acquired atrophy of thyroid - levothyroxine (SYNTHROID, LEVOTHROID) 75 MCG tablet; Take 1 tablet (75 mcg total) by mouth daily.  Dispense: 90 tablet; Refill: 2  3. Neuropathy ([place]) Do noit go barefooted - gabapentin (NEURONTIN) 100 MG capsule; TAKE (1) CAPSULE THREE TIMES DAILY.  Dispense: 90 capsule; Refill: 5 - traMADol (ULTRAM) 50 MG tablet; Take 1 tablet (50 mg total) by mouth 2 (two) times daily.  Dispense: 60 tablet; Refill: 2  4. GAD (generalized anxiety disorder) Stress management - ALPRAZolam (XANAX) 0.25 MG tablet; TAKE  (1)  TABLET TWICE A DAY AS NEEDED.  Dispense: 60 tablet; Refill: 1  5. Depression - sertraline (ZOLOFT) 50 MG tablet; Take 1 tablet by mouth  daily  Dispense: 90 tablet; Refill: 1  6. Restless leg syndrome Keep legs warm - rOPINIRole (REQUIP) 0.5 MG tablet; Take 1 tablet (0.5 mg total) by mouth at bedtime.  Dispense: 90 tablet; Refill: 1  7. Hyperlipidemia with target LDL less than 100 Low fat diet - simvastatin (ZOCOR) 40 MG  tablet; Take 1 tablet by mouth at  bedtime  Dispense: 90 tablet; Refill: 1 - Lipid panel  8. Insomnia Bedtime ritual - zolpidem (AMBIEN) 10 MG tablet; Take 1 tablet (10 mg total) by mouth at bedtime as needed. for sleep  Dispense: 30 tablet; Refill: 1  9. Anemia, unspecified anemia type  10. BMI 31.0-31.9,adult Discussed diet and exercise for person with BMI >25 Will recheck weight in 3-6 months     Labs pending Health maintenance reviewed Diet and exercise encouraged Continue all meds Follow up  In 3 month   [place], FNP

## 2016-03-22 LAB — CMP14+EGFR
A/G RATIO: 1.8 (ref 1.2–2.2)
ALBUMIN: 4.1 g/dL (ref 3.5–4.8)
ALK PHOS: 98 IU/L (ref 39–117)
ALT: 9 IU/L (ref 0–32)
AST: 16 IU/L (ref 0–40)
BILIRUBIN TOTAL: 0.4 mg/dL (ref 0.0–1.2)
BUN / CREAT RATIO: 18 (ref 12–28)
BUN: 14 mg/dL (ref 8–27)
CO2: 26 mmol/L (ref 18–29)
CREATININE: 0.8 mg/dL (ref 0.57–1.00)
Calcium: 9.1 mg/dL (ref 8.7–10.3)
Chloride: 99 mmol/L (ref 96–106)
GFR calc Af Amer: 86 mL/min/{1.73_m2} (ref >59)
GFR calc non Af Amer: 75 mL/min/{1.73_m2} (ref >59)
GLOBULIN, TOTAL: 2.3 g/dL (ref 1.5–4.5)
Glucose: 88 mg/dL (ref 65–99)
POTASSIUM: 4.4 mmol/L (ref 3.5–5.2)
SODIUM: 141 mmol/L (ref 134–144)
Total Protein: 6.4 g/dL (ref 6.0–8.5)

## 2016-03-22 LAB — LIPID PANEL
CHOL/HDL RATIO: 2.4 ratio (ref 0.0–4.4)
CHOLESTEROL TOTAL: 169 mg/dL (ref 100–199)
HDL: 69 mg/dL (ref >39)
LDL CALC: 77 mg/dL (ref 0–99)
Triglycerides: 117 mg/dL (ref 0–149)
VLDL Cholesterol Cal: 23 mg/dL (ref 5–40)

## 2016-03-25 ENCOUNTER — Other Ambulatory Visit: Payer: Self-pay | Admitting: Nurse Practitioner

## 2016-03-28 ENCOUNTER — Ambulatory Visit (HOSPITAL_COMMUNITY)
Admission: RE | Admit: 2016-03-28 | Discharge: 2016-03-28 | Disposition: A | Discharge status: Home / Self Care | Payer: PPO | Source: Ambulatory Visit | Attending: Nurse Practitioner | Admitting: Nurse Practitioner

## 2016-03-28 DIAGNOSIS — Z1231 Encounter for screening mammogram for malignant neoplasm of breast: Secondary | ICD-10-CM

## 2016-04-11 ENCOUNTER — Encounter: Payer: PPO | Admitting: *Deleted

## 2016-04-19 DIAGNOSIS — M25561 Pain in right knee: Secondary | ICD-10-CM | POA: Diagnosis not present

## 2016-04-19 DIAGNOSIS — M25562 Pain in left knee: Secondary | ICD-10-CM | POA: Diagnosis not present

## 2016-04-19 DIAGNOSIS — M170 Bilateral primary osteoarthritis of knee: Secondary | ICD-10-CM | POA: Diagnosis not present

## 2016-04-27 ENCOUNTER — Ambulatory Visit (INDEPENDENT_AMBULATORY_CARE_PROVIDER_SITE_OTHER): Payer: PPO | Admitting: Nurse Practitioner

## 2016-04-27 ENCOUNTER — Encounter: Payer: Self-pay | Admitting: Nurse Practitioner

## 2016-04-27 ENCOUNTER — Ambulatory Visit (INDEPENDENT_AMBULATORY_CARE_PROVIDER_SITE_OTHER): Payer: PPO

## 2016-04-27 VITALS — BP 91/57 | HR 80 | Temp 97.7°F | Ht 64.0 in | Wt 181.0 lb

## 2016-04-27 DIAGNOSIS — S81801A Unspecified open wound, right lower leg, initial encounter: Secondary | ICD-10-CM

## 2016-04-27 DIAGNOSIS — S81811A Laceration without foreign body, right lower leg, initial encounter: Secondary | ICD-10-CM

## 2016-04-27 DIAGNOSIS — Z0000 Encounter for general adult medical examination without abnormal findings: Secondary | ICD-10-CM

## 2016-04-27 DIAGNOSIS — Z01818 Encounter for other preprocedural examination: Secondary | ICD-10-CM

## 2016-04-27 MED ORDER — CEPHALEXIN 500 MG PO CAPS: 500.0000 mg | ORAL_CAPSULE | Freq: Three times a day (TID) | ORAL | 0 refills | Status: DC

## 2016-04-27 NOTE — Patient Instructions (Signed)
Skin Tear Care  A skin tear is a wound in which the top layer of skin has peeled off. This is a common problem with aging because the skin becomes thinner and more fragile as a person gets older. In addition, some medicines, such as oral corticosteroids, can lead to skin thinning if taken for long periods of time.   A skin tear is often repaired with tape or skin adhesive strips. This keeps the skin that has been peeled off in contact with the healthier skin beneath. Depending on the location of the wound, a bandage (dressing) may be applied over the tape or skin adhesive strips. Sometimes, during the healing process, the skin turns black and dies. Even when this happens, the torn skin acts as a good dressing until the skin underneath gets healthier and repairs itself.  HOME CARE INSTRUCTIONS    Change dressings once per day or as directed by your caregiver.   Gently clean the skin tear and the area around the tear using saline solution or mild soap and water.   Do not rub the injured skin dry. Let the area air dry.   Apply petroleum jelly or an antibiotic cream or ointment to keep the tear moist. This will help the wound heal. Do not allow a scab to form.   If the dressing sticks before the next dressing change, moisten it with warm soapy water and gently remove it.   Protect the injured skin until it has healed.   Only take over-the-counter or prescription medicines as directed by your caregiver.   Take showers or baths using warm soapy water. Apply a new dressing after the shower or bath.   Keep all follow-up appointments as directed by your caregiver.   SEEK IMMEDIATE MEDICAL CARE IF:    You have redness, swelling, or increasing pain in the skin tear.   You havepus coming from the skin tear.   You have chills.   You have a red streak that goes away from the skin tear.   You have a bad smell coming from the tear or dressing.   You have a fever or persistent symptoms for more than 2-3 days.   You  have a fever and your symptoms suddenly get worse.  MAKE SURE YOU:   Understand these instructions.   Will watch this condition.   Will get help right away if your child is not doing well or gets worse.     This information is not intended to replace advice given to you by your health care provider. Make sure you discuss any questions you have with your health care provider.     Document Released: 05/24/2001 Document Revised: 05/23/2012 Document Reviewed: 03/12/2012  Elsevier Interactive Patient Education 2016 [employer]

## 2016-04-27 NOTE — Progress Notes (Signed)
   Subjective:    Patient ID: Phyllis Walker, female    DOB: Jan 29, 1946, 70 y.o.   MRN: [id number]  HPI  Patient comes in today for surgical clearance. SHe is scheduled for a total knee replacement in December. SHe is doing well without complaints.   Review of Systems  Constitutional: Negative.   HENT: Negative.   Respiratory: Negative.   Cardiovascular: Negative.   Gastrointestinal: Negative.   Genitourinary: Negative.   Neurological: Negative.   Psychiatric/Behavioral: Negative.   All other systems reviewed and are negative.      Objective:   Physical Exam  Constitutional: She is oriented to person, place, and time. She appears well-developed and well-nourished.  HENT:  Nose: Nose normal.  Mouth/Throat: Oropharynx is clear and moist.  Eyes: EOM are normal.  Neck: Trachea normal, normal range of motion and full passive range of motion without pain. Neck supple. No JVD present. Carotid bruit is not present. No thyromegaly present.  Cardiovascular: Normal rate, regular rhythm, normal heart sounds and intact distal pulses.  Exam reveals no gallop and no friction rub.   No murmur heard. Pulmonary/Chest: Effort normal and breath sounds normal.  Abdominal: Soft. Bowel sounds are normal. She exhibits no distension and no mass. There is no tenderness.  Musculoskeletal: Normal range of motion.  Lymphadenopathy:    She has no cervical adenopathy.  Neurological: She is alert and oriented to person, place, and time. She has normal reflexes.  Skin: Skin is warm and dry.  Healing skin tear to left lowerleg- erythema surrounding.  Psychiatric: She has a normal mood and affect. Her behavior is normal. Judgment and thought content normal.    BP (!) 91/57   Pulse 80   Temp 97.7 F (36.5 C) (Oral)   Ht 5\' 4"  (1.626 m)   Wt 181 lb (82.1 kg)   BMI 31.07 kg/m   Adella Nissen, FNP Chest xray- no cardiopulmonary abnormalities-Preliminary reading by Ronnald Collum, FNP   [facility]      Assessment & Plan:  1. Preoperative clearance Patient will need lab work prior to surgery - DG Chest 2 View; Future - EKG 12-Lead  2. Skin tear of right lower leg without complication, initial encounter Wound care discussed Follow up prn - cephALEXin (KEFLEX) 500 MG capsule; Take 1 capsule (500 mg total) by mouth 3 (three) times daily.  Dispense: 30 capsule; Refill: 0  Mary-Margaret Hassell Done, FNP

## 2016-05-09 ENCOUNTER — Other Ambulatory Visit: Payer: Self-pay | Admitting: Orthopedic Surgery

## 2016-05-23 ENCOUNTER — Other Ambulatory Visit: Payer: Self-pay | Admitting: Nurse Practitioner

## 2016-05-23 DIAGNOSIS — G4700 Insomnia, unspecified: Secondary | ICD-10-CM

## 2016-05-23 DIAGNOSIS — F411 Generalized anxiety disorder: Secondary | ICD-10-CM

## 2016-05-23 NOTE — Telephone Encounter (Signed)
Forwarding to PCP/MMM 

## 2016-05-24 NOTE — Telephone Encounter (Signed)
Please call in ambiem and xanax with 1 refill each

## 2016-06-17 ENCOUNTER — Other Ambulatory Visit: Payer: Self-pay | Admitting: Nurse Practitioner

## 2016-06-17 DIAGNOSIS — G629 Polyneuropathy, unspecified: Secondary | ICD-10-CM

## 2016-06-17 NOTE — Telephone Encounter (Signed)
rx ready for pick up. Will ntbs in future for pain meds

## 2016-06-28 ENCOUNTER — Ambulatory Visit (INDEPENDENT_AMBULATORY_CARE_PROVIDER_SITE_OTHER): Payer: PPO | Admitting: Nurse Practitioner

## 2016-06-28 ENCOUNTER — Encounter: Payer: Self-pay | Admitting: Nurse Practitioner

## 2016-06-28 VITALS — BP 118/67 | HR 95 | Temp 97.0°F | Ht 64.0 in | Wt 180.0 lb

## 2016-06-28 DIAGNOSIS — F411 Generalized anxiety disorder: Secondary | ICD-10-CM | POA: Diagnosis not present

## 2016-06-28 DIAGNOSIS — D649 Anemia, unspecified: Secondary | ICD-10-CM | POA: Diagnosis not present

## 2016-06-28 DIAGNOSIS — G629 Polyneuropathy, unspecified: Secondary | ICD-10-CM

## 2016-06-28 DIAGNOSIS — Z6831 Body mass index (BMI) 31.0-31.9, adult: Secondary | ICD-10-CM | POA: Diagnosis not present

## 2016-06-28 DIAGNOSIS — F3342 Major depressive disorder, recurrent, in full remission: Secondary | ICD-10-CM

## 2016-06-28 DIAGNOSIS — E785 Hyperlipidemia, unspecified: Secondary | ICD-10-CM | POA: Diagnosis not present

## 2016-06-28 DIAGNOSIS — I10 Essential (primary) hypertension: Secondary | ICD-10-CM

## 2016-06-28 DIAGNOSIS — G2581 Restless legs syndrome: Secondary | ICD-10-CM

## 2016-06-28 DIAGNOSIS — F5101 Primary insomnia: Secondary | ICD-10-CM

## 2016-06-28 DIAGNOSIS — E034 Atrophy of thyroid (acquired): Secondary | ICD-10-CM | POA: Diagnosis not present

## 2016-06-28 MED ORDER — ALPRAZOLAM 0.25 MG PO TABS: ORAL_TABLET | ORAL | 1 refills | Status: DC

## 2016-06-28 MED ORDER — ZOLPIDEM TARTRATE 10 MG PO TABS: 10.0000 mg | ORAL_TABLET | Freq: Every evening | ORAL | 1 refills | Status: DC | PRN

## 2016-06-28 NOTE — Progress Notes (Signed)
Subjective:    Patient ID: Phyllis Walker, female    DOB: [birth year], 70 y.o.   MRN: [id number]  Patient here today for follow up of chronic medical problems. No change since last vist. No complaints today.  Outpatient Encounter Prescriptions as of 06/28/2016  Medication Sig  . ALPRAZolam (XANAX) 0.25 MG tablet TAKE  (1)  TABLET TWICE A DAY AS NEEDED.  [name] benazepril-hydrochlorthiazide (LOTENSIN HCT) 20-12.5 MG tablet Take 1 tablet by mouth daily. (Patient taking differently: Take 1 tablet by mouth daily. Patient is taking 1/2 tab daily)  . gabapentin (NEURONTIN) 100 MG capsule TAKE (1) CAPSULE THREE TIMES DAILY.  [name] levothyroxine (SYNTHROID, LEVOTHROID) 75 MCG tablet Take 1 tablet (75 mcg total) by mouth daily.  [name] rOPINIRole (REQUIP) 0.5 MG tablet Take 1 tablet (0.5 mg total) by mouth at bedtime.  . sertraline (ZOLOFT) 50 MG tablet Take 1 tablet by mouth  daily  . traMADol (ULTRAM) 50 MG tablet TAKE  (1)  TABLET TWICE A DAY.  [name] zolpidem (AMBIEN) 10 MG tablet TAKE 1 TABLET AT BEDTIME AS NEEDED FOR SLEEP     Hyperlipidemia  This is a chronic problem. The current episode started more than 1 year ago. The problem is controlled. Recent lipid tests were reviewed and are normal. Exacerbating diseases include hypothyroidism. She has no history of diabetes. Pertinent negatives include no chest pain or shortness of breath. She is currently on no antihyperlipidemic treatment (has stopped her statins and she feels the best she has ever felt- drinking red wine). Compliance problems include adherence to diet.  Risk factors for coronary artery disease include dyslipidemia, hypertension and post-menopausal.  Hypertension  This is a chronic problem. The current episode started more than 1 year ago. The problem is controlled. Pertinent negatives include no chest pain, headaches, neck pain, palpitations or shortness of breath. Risk factors for coronary artery disease include dyslipidemia and post-menopausal state.  Past treatments include diuretics and ACE inhibitors. The current treatment provides moderate improvement. There are no compliance problems.  Hypertensive end-organ damage includes a thyroid problem.  Thyroid Problem  Presents for follow-up visit. Patient reports no diarrhea, menstrual problem, palpitations or visual change. The symptoms have been stable. Her past medical history is significant for hyperlipidemia. There is no history of diabetes.  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 She also takes tramadol on occasion. Depression/Gad Zoloft and xanax- combination working well. RLS Patient on requip which is working great. Insomnia Currently on ambien- working well- feels rested in mornings Hx anemia-  Have not checked labs in awhile- o c/o fatigue Osteoporosis with past fracture Currently on no meds- no c/o back pain    Review of Systems  Constitutional: Negative.   HENT: Negative.   Respiratory: Negative for shortness of breath.   Cardiovascular: Negative for chest pain and palpitations.  Gastrointestinal: Negative for diarrhea.  Genitourinary: Negative for menstrual problem.  Musculoskeletal: Negative for neck pain.  Neurological: Negative for headaches.  All other systems reviewed and are negative.      Objective:   Physical Exam  Constitutional: She is oriented to person, place, and time. She appears well-developed and well-nourished.  HENT:  Nose: Nose normal.  Mouth/Throat: Oropharynx is clear and moist.  Eyes: EOM are normal.  Neck: Trachea normal, normal range of motion and full passive range of motion without pain. Neck supple. No JVD present. Carotid bruit is not present. No thyromegaly present.  Cardiovascular: Normal rate, regular rhythm, normal heart sounds and intact distal pulses.  Exam reveals no gallop and no friction rub.   No murmur heard. Pulmonary/Chest: Effort normal and  breath sounds normal.  Abdominal: Soft. Bowel sounds are normal. She exhibits no distension and no mass. There is no tenderness.  Musculoskeletal: Normal range of motion.  FROM of right hip without increasing pain  Lymphadenopathy:    She has no cervical adenopathy.  Neurological: She is alert and oriented to person, place, and time. She has normal reflexes.  Skin: Skin is warm and dry.  Psychiatric: She has a normal mood and affect. Her behavior is normal. Judgment and thought content normal.   BP 118/67   Pulse 95   Temp 97 F (36.1 C) (Oral)   Ht '5\' 4"'  (1.626 m)   Wt 180 lb (81.6 kg)   BMI 30.90 kg/m        Assessment & Plan:  1. Hypertension, benign essential, goal below 140/90 Do not add salt to diet - CMP14+EGFR  2. Hypothyroidism due to acquired atrophy of thyroid - Thyroid Panel With TSH  3. Neuropathy ([place])   4. Anemia, unspecified type - CBC with Differential/Platelet  5. BMI 31.0-31.9,adult Discussed diet and exercise for person with BMI >25 Will recheck weight in 3-6 months  6. Recurrent major depressive disorder, in full remission [facility]) Stress management  7. GAD (generalized anxiety disorder) - ALPRAZolam (XANAX) 0.25 MG tablet; TAKE  (1)  TABLET TWICE A DAY AS NEEDED.  Dispense: 60 tablet; Refill: 1  8. Hyperlipidemia with target LDL less than 100 Low fat diet - Lipid panel  9. Primary insomnia Bedtime ritual - zolpidem (AMBIEN) 10 MG tablet; Take 1 tablet (10 mg total) by mouth at bedtime as needed. for sleep  Dispense: 30 tablet; Refill: 1  10. Restless leg syndrome Keep legs warm at night    Labs pending Health maintenance reviewed Diet and exercise encouraged Continue all meds Follow up  In 6 months   [place], FNP

## 2016-06-29 LAB — CBC WITH DIFFERENTIAL/PLATELET
BASOS ABS: 0.1 10*3/uL (ref 0.0–0.2)
Basos: 1 %
EOS (ABSOLUTE): 0.3 10*3/uL (ref 0.0–0.4)
EOS: 4 %
HEMATOCRIT: 34.6 % (ref 34.0–46.6)
HEMOGLOBIN: 11.3 g/dL (ref 11.1–15.9)
IMMATURE GRANULOCYTES: 0 %
Immature Grans (Abs): 0 10*3/uL (ref 0.0–0.1)
LYMPHS ABS: 2.4 10*3/uL (ref 0.7–3.1)
LYMPHS: 32 %
MCH: 28 pg (ref 26.6–33.0)
MCHC: 32.7 g/dL (ref 31.5–35.7)
MCV: 86 fL (ref 79–97)
MONOCYTES: 7 %
Monocytes Absolute: 0.5 10*3/uL (ref 0.1–0.9)
NEUTROS PCT: 56 %
Neutrophils Absolute: 4.1 10*3/uL (ref 1.4–7.0)
Platelets: 253 10*3/uL (ref 150–379)
RBC: 4.03 x10E6/uL (ref 3.77–5.28)
RDW: 13.9 % (ref 12.3–15.4)
WBC: 7.5 10*3/uL (ref 3.4–10.8)

## 2016-06-29 LAB — LIPID PANEL
CHOL/HDL RATIO: 3.9 ratio (ref 0.0–4.4)
Cholesterol, Total: 231 mg/dL — ABNORMAL HIGH (ref 100–199)
HDL: 59 mg/dL (ref >39)
LDL Calculated: 143 mg/dL — ABNORMAL HIGH (ref 0–99)
Triglycerides: 146 mg/dL (ref 0–149)
VLDL CHOLESTEROL CAL: 29 mg/dL (ref 5–40)

## 2016-06-29 LAB — CMP14+EGFR
A/G RATIO: 1.5 (ref 1.2–2.2)
ALBUMIN: 4 g/dL (ref 3.5–4.8)
ALT: 11 IU/L (ref 0–32)
AST: 22 IU/L (ref 0–40)
Alkaline Phosphatase: 104 IU/L (ref 39–117)
BILIRUBIN TOTAL: 0.3 mg/dL (ref 0.0–1.2)
BUN / CREAT RATIO: 18 (ref 12–28)
BUN: 14 mg/dL (ref 8–27)
CALCIUM: 8.9 mg/dL (ref 8.7–10.3)
CO2: 27 mmol/L (ref 18–29)
Chloride: 100 mmol/L (ref 96–106)
Creatinine, Ser: 0.8 mg/dL (ref 0.57–1.00)
GFR, EST AFRICAN AMERICAN: 86 mL/min/{1.73_m2} (ref >59)
GFR, EST NON AFRICAN AMERICAN: 75 mL/min/{1.73_m2} (ref >59)
GLOBULIN, TOTAL: 2.7 g/dL (ref 1.5–4.5)
Glucose: 80 mg/dL (ref 65–99)
POTASSIUM: 4.4 mmol/L (ref 3.5–5.2)
Sodium: 142 mmol/L (ref 134–144)
TOTAL PROTEIN: 6.7 g/dL (ref 6.0–8.5)

## 2016-06-29 LAB — THYROID PANEL WITH TSH
FREE THYROXINE INDEX: 2.5 (ref 1.2–4.9)
T3 Uptake Ratio: 28 % (ref 24–39)
T4 TOTAL: 8.8 ug/dL (ref 4.5–12.0)
TSH: 1.55 u[IU]/mL (ref 0.450–4.500)

## 2016-07-01 ENCOUNTER — Encounter: Payer: Self-pay | Admitting: Internal Medicine

## 2016-07-06 ENCOUNTER — Other Ambulatory Visit: Payer: Self-pay | Admitting: Nurse Practitioner

## 2016-07-06 DIAGNOSIS — G629 Polyneuropathy, unspecified: Secondary | ICD-10-CM

## 2016-07-22 DIAGNOSIS — M1711 Unilateral primary osteoarthritis, right knee: Secondary | ICD-10-CM | POA: Diagnosis not present

## 2016-08-08 ENCOUNTER — Encounter (HOSPITAL_COMMUNITY)
Admission: RE | Admit: 2016-08-08 | Discharge: 2016-08-08 | Disposition: A | Discharge status: Home / Self Care | Payer: PPO | Source: Ambulatory Visit | Attending: Orthopedic Surgery | Admitting: Orthopedic Surgery

## 2016-08-08 ENCOUNTER — Encounter (HOSPITAL_COMMUNITY): Payer: Self-pay

## 2016-08-08 DIAGNOSIS — I700 Atherosclerosis of aorta: Secondary | ICD-10-CM | POA: Insufficient documentation

## 2016-08-08 DIAGNOSIS — K449 Diaphragmatic hernia without obstruction or gangrene: Secondary | ICD-10-CM | POA: Insufficient documentation

## 2016-08-08 DIAGNOSIS — R918 Other nonspecific abnormal finding of lung field: Secondary | ICD-10-CM | POA: Diagnosis not present

## 2016-08-08 DIAGNOSIS — E041 Nontoxic single thyroid nodule: Secondary | ICD-10-CM | POA: Insufficient documentation

## 2016-08-08 DIAGNOSIS — J398 Other specified diseases of upper respiratory tract: Secondary | ICD-10-CM | POA: Insufficient documentation

## 2016-08-08 HISTORY — DX: Polyneuropathy, unspecified: G62.9

## 2016-08-08 HISTORY — DX: Unspecified osteoarthritis, unspecified site: M19.90

## 2016-08-08 HISTORY — DX: Personal history of other diseases of the digestive system: Z87.19

## 2016-08-08 HISTORY — DX: Hypothyroidism, unspecified: E03.9

## 2016-08-08 LAB — CBC WITH DIFFERENTIAL/PLATELET
BASOS PCT: 1 %
Basophils Absolute: 0.1 10*3/uL (ref 0.0–0.1)
Eosinophils Absolute: 0.4 10*3/uL (ref 0.0–0.7)
Eosinophils Relative: 4 %
HEMATOCRIT: 37.1 % (ref 36.0–46.0)
HEMOGLOBIN: 11.6 g/dL — AB (ref 12.0–15.0)
LYMPHS ABS: 2.6 10*3/uL (ref 0.7–4.0)
Lymphocytes Relative: 30 %
MCH: 27.4 pg (ref 26.0–34.0)
MCHC: 31.3 g/dL (ref 30.0–36.0)
MCV: 87.7 fL (ref 78.0–100.0)
MONOS PCT: 6 %
Monocytes Absolute: 0.5 10*3/uL (ref 0.1–1.0)
NEUTROS ABS: 5.2 10*3/uL (ref 1.7–7.7)
NEUTROS PCT: 59 %
Platelets: 246 10*3/uL (ref 150–400)
RBC: 4.23 MIL/uL (ref 3.87–5.11)
RDW: 14.3 % (ref 11.5–15.5)
WBC: 8.7 10*3/uL (ref 4.0–10.5)

## 2016-08-08 LAB — COMPREHENSIVE METABOLIC PANEL
ALBUMIN: 3.7 g/dL (ref 3.5–5.0)
ALK PHOS: 94 U/L (ref 38–126)
ALT: 16 U/L (ref 14–54)
ANION GAP: 9 (ref 5–15)
AST: 24 U/L (ref 15–41)
BILIRUBIN TOTAL: 0.5 mg/dL (ref 0.3–1.2)
BUN: 12 mg/dL (ref 6–20)
CALCIUM: 9.1 mg/dL (ref 8.9–10.3)
CO2: 28 mmol/L (ref 22–32)
CREATININE: 0.75 mg/dL (ref 0.44–1.00)
Chloride: 103 mmol/L (ref 101–111)
GFR calc Af Amer: >60 mL/min (ref >60)
GFR calc non Af Amer: >60 mL/min (ref >60)
GLUCOSE: 90 mg/dL (ref 65–99)
Potassium: 3.5 mmol/L (ref 3.5–5.1)
Sodium: 140 mmol/L (ref 135–145)
TOTAL PROTEIN: 6.9 g/dL (ref 6.5–8.1)

## 2016-08-08 LAB — SURGICAL PCR SCREEN
MRSA, PCR: NEGATIVE
Staphylococcus aureus: NEGATIVE

## 2016-08-08 LAB — PROTIME-INR
INR: 1.09
Prothrombin Time: 14.1 seconds (ref 11.4–15.2)

## 2016-08-08 NOTE — Pre-Procedure Instructions (Signed)
BETZAIDA AHLSTRAND  08/08/2016      [name], [place] [address] Phone: [phone] Fax: [phone]  [facility], [place] [address] [place] [address] Phone: [phone] Fax: [phone]  [id number] - [place], [place] - [address] [place] [address] Phone: [phone] Fax: [place], [facility] [address] [place] [address] [place] [address] Phone: [phone] Fax: [phone]    Your procedure is scheduled on Monday November 4.  Report to [facility] Admitting at 5:30 A.M.  Call this number if you have problems the morning of surgery:  [phone]   Remember:  Do not eat food or drink liquids after midnight.  Take these medicines the morning of surgery with A SIP OF WATER: gabapentin (neurontin), levothyroxine (synthroid), ropinirole (Requip), sertraline (zoloft), tramadol (ultram)  7 days prior to surgery STOP taking any Aspirin, Aleve, Naproxen, Ibuprofen, Motrin, Advil, Goody's, BC's, all herbal medications, fish oil, and all vitamins    Do not wear jewelry, make-up or nail polish.  Do not wear lotions, powders, or perfumes, or deoderant.  Do not shave 48 hours prior to surgery.  Men may shave face and neck.  Do not bring valuables to the hospital.  [facility] is not responsible for any belongings or valuables.  Contacts, dentures or bridgework may not be worn into surgery.  Leave your suitcase in the car.  After surgery it may be brought to your room.  For patients admitted to the hospital, discharge time will be determined by your treatment team.  Patients discharged the day of surgery will not be allowed to drive home.   Special instructions:    [place]- Preparing For Surgery  Before surgery, you can play an important role.  Because skin is not sterile, your skin needs to be as free of germs as possible. You can reduce the number of germs on your skin by washing with CHG (chlorahexidine gluconate) Soap before surgery.  CHG is an antiseptic cleaner which kills germs and bonds with the skin to continue killing germs even after washing.  Please do not use if you have an allergy to CHG or antibacterial soaps. If your skin becomes reddened/irritated stop using the CHG.  Do not shave (including legs and underarms) for at least 48 hours prior to first CHG shower. It is OK to shave your face.  Please follow these instructions carefully.   1. Shower the NIGHT BEFORE SURGERY and the MORNING OF SURGERY with CHG.   2. If you chose to wash your hair, wash your hair first as usual with your normal shampoo.  3. After you shampoo, rinse your hair and body thoroughly to remove the shampoo.  4. Use CHG as you would any other liquid soap. You can apply CHG directly to the skin and wash gently with a scrungie or a clean washcloth.   5. Apply the CHG Soap to your body ONLY FROM THE NECK DOWN.  Do not use on open wounds or open sores. Avoid contact with your eyes, ears, mouth and genitals (private parts). Wash genitals (private parts) with your normal soap.  6. Wash thoroughly, paying special attention to the area where your surgery will be performed.  7. Thoroughly rinse your body with warm water from the neck down.  8. DO NOT shower/wash with your normal soap after using and rinsing off the CHG Soap.  9. Pat yourself dry with a CLEAN TOWEL.   10. Wear CLEAN PAJAMAS   11. Place CLEAN SHEETS on your bed the night of your first shower and DO NOT SLEEP WITH PETS.    Day of Surgery: Do not apply any deodorants/lotions. Please wear clean clothes to the hospital/surgery center.      Please read over the following fact sheets that you were given. Total Joint Packet and MRSA Information

## 2016-08-08 NOTE — Progress Notes (Signed)
PCP: Chevis Pretty No cardiologist or cardiac hx per pt.   CXR: 04/27/16, chart forwarded to anesthesia d/t CXR.  EKG: 04/27/16  Pt denies SOB, chest pain, or signs of infection at PAT appointment.

## 2016-08-08 NOTE — Progress Notes (Signed)
Anesthesia PAT Evaluation: Patient is a 70 year old female scheduled for right TKA on 08/15/16 by Dr. Ronnie Derby. I evaluated patient during her 08/08/16 PAT visit.  Her son is an anesthesiologist in [place]. He will be with her on the day of surgery. She is anticipating spinal anesthesia with block (adductor canal block). I would also anticipate intra-articular injection by surgeon.   History includes former smoker (quit '08), HTN, hypercholesterolemia, RLS, anxiety, depression, anemia, hiatal hernia, arthritis, neuropathy (feet), partial thyroidectomy (right lobectomy by U/S, "parts of both" per patient due to goiter and "trouble keeping my thyroid regulated"; reportedly benign pathology; also reports benign pathology of subsequent left thyroid nodule biopsy), hypothyroidism.  Family history of thyroid cancer (sister) and what sounds like Graves disease in several of her relatives. Dad had MI in his 49's, died at 14. She reports left thyroid nodule has been stable for years. She denies SOB or significant dysphagia--she does feel like corn bread can be harder to "go down" but she has attributed this to her hiatal hernia. She has an oral front upper plate--she doesn't want to remove it, particularly since she is anticipating spinal anesthesia. I advised that she can discuss further with her anesthesiologist, but will likely be asked to remove her plate prior to surgery. If it has to be removed, she wants to wait until just before going into the OR.   PCP is Mary-Margaret Hassell Done, FNP with [facility] (see Epic). She medically cleared patient for surgery pending acceptable lab results (scanned under Media tab).  Meds include Xanax, Lotensin HCT, Zyrtec, ferrous sulfate, gabapentin, Norco, levothyroxine (recently decreased from 88 mcg to 75 mcg), fish oil, Requip, Zoloft, tramadol, Ambien.   BP (!) 127/59 Comment: rechecked  Pulse 91   Temp 36.6 C   Resp 20   Ht 5\' 4"  (1.626 m)   Wt 177 lb  6.4 oz (80.5 kg)   SpO2 99%   BMI 30.45 kg/m  Exam shows a pleasant Caucasian female in NAD. Heart RRR. I did not appreciate a murmur or carotid bruits. There is firmness along her left thyroid. Lungs clear. She denied any known valvular disease, bleeding disorders, or prior back surgeries. No CP or SOB.   04/27/16 EKG: NSR.  04/27/16 CXR: FINDINGS: Normal heart size. Lower mediastinal widening attributed to small hiatal hernia. Trachea is deviated to the right by a peripherally calcified opacity. This correlates with CT report 07/13/2011. There is no edema, consolidation, effusion, or pneumothorax. Exaggerated thoracic kyphosis with dextroscoliosis. No acute osseous finding. IMPRESSION: 1. No active cardiopulmonary disease. 2.  Aortic Atherosclerosis (ICD10-170.0) 3. Rightward tracheal deviation at the thoracic inlet correlating with reported calcified thyroid nodule on 2012 CT. 4. Suspect small hiatal hernia.  Additional thyroid imaging found in PACS: 07/13/11 Thyroid U/S: Findings: There is noted a 3.1 x 2.8 cm solid nodule at the left lobe of the thyroid. The nodule is partially calcified as best demonstrated on CT. The right lobe of the thyroid is quite small and not well visualized. The left lobe is slightly enlarged due to the presence of the enlarged nodule.   Impression: 3 cm solid nodule in the left lobe of the thyroid that is partially calcified. Recommend needle biopsy at the patient is not hyperthyroid. If the patient is hyperthyroid the nuclear medicine scan should be performed.  07/13/11 CT Chest w/ CM: IMPRESSION:  1. Ill-defined nodular and somewhat linear parenchymal infiltrate in the right midlung. There is some confluence of the nodularity laterally.  Findings could represent underlying inflammatory process and would recommend short interval CT scan in 3 months to document resolution. There is no evidence of adenopathy. 2. Incidental note made of hiatal hernia. 3. 1 cm  lesion in the left lobe of the liver likely representing hepatic cysts. 4. Soft tissue mass adjacent to the thyroid measuring up to 2.8 cm in maximal transverse diameter and likely representing left lobe thyroid nodule. Further evaluation with ultrasound can be performed as needed clinically. The peripheral calcifications more often associated with benign etiology. However further management can be based on clinical indication ultrasound appearance.  Patient reports that the left thyroid nodule was biopsied several years ago and was negative. She had previously been followed by Dr. Kerman Passey in [place]. Her thyroid is now being monitored by her PCP.   Preoperative labs noted. Last thyroid studies on 06/28/16 showed a normal TSH (1.550), T4, free thyroxine index, and T3 uptake ratio.  If no acute changes then I anticipate that she can proceed as planned.  George Hugh [facility] Short Stay Center/Anesthesiology Phone [phone] 08/09/2016 10:04 AM

## 2016-08-12 MED ORDER — TRANEXAMIC ACID 1000 MG/10ML IV SOLN
1000.0000 mg | INTRAVENOUS | Status: AC
  Administered 2016-08-15: 1000 mg via INTRAVENOUS
  Filled 2016-08-12: qty 10

## 2016-08-15 ENCOUNTER — Encounter (HOSPITAL_COMMUNITY): Payer: Self-pay | Admitting: *Deleted

## 2016-08-15 ENCOUNTER — Inpatient Hospital Stay (HOSPITAL_COMMUNITY): Payer: PPO | Admitting: Vascular Surgery

## 2016-08-15 ENCOUNTER — Inpatient Hospital Stay (HOSPITAL_COMMUNITY)
Admission: RE | Admit: 2016-08-15 | Discharge: 2016-08-16 | DRG: 470 | Disposition: A | Discharge status: Home Health | Payer: PPO | Source: Ambulatory Visit | Attending: Orthopedic Surgery | Admitting: Orthopedic Surgery

## 2016-08-15 ENCOUNTER — Inpatient Hospital Stay (HOSPITAL_COMMUNITY): Payer: PPO | Admitting: Certified Registered Nurse Anesthetist

## 2016-08-15 ENCOUNTER — Encounter (HOSPITAL_COMMUNITY)
Admission: RE | Disposition: A | Discharge status: Home Health | Payer: Self-pay | Source: Ambulatory Visit | Attending: Orthopedic Surgery

## 2016-08-15 DIAGNOSIS — K449 Diaphragmatic hernia without obstruction or gangrene: Secondary | ICD-10-CM | POA: Diagnosis not present

## 2016-08-15 DIAGNOSIS — Z880 Allergy status to penicillin: Secondary | ICD-10-CM

## 2016-08-15 DIAGNOSIS — G4700 Insomnia, unspecified: Secondary | ICD-10-CM | POA: Diagnosis not present

## 2016-08-15 DIAGNOSIS — J302 Other seasonal allergic rhinitis: Secondary | ICD-10-CM | POA: Diagnosis not present

## 2016-08-15 DIAGNOSIS — Z972 Presence of dental prosthetic device (complete) (partial): Secondary | ICD-10-CM | POA: Diagnosis not present

## 2016-08-15 DIAGNOSIS — H269 Unspecified cataract: Secondary | ICD-10-CM | POA: Diagnosis present

## 2016-08-15 DIAGNOSIS — Z96659 Presence of unspecified artificial knee joint: Secondary | ICD-10-CM

## 2016-08-15 DIAGNOSIS — Z79899 Other long term (current) drug therapy: Secondary | ICD-10-CM

## 2016-08-15 DIAGNOSIS — Z8249 Family history of ischemic heart disease and other diseases of the circulatory system: Secondary | ICD-10-CM

## 2016-08-15 DIAGNOSIS — G8918 Other acute postprocedural pain: Secondary | ICD-10-CM | POA: Diagnosis not present

## 2016-08-15 DIAGNOSIS — G2581 Restless legs syndrome: Secondary | ICD-10-CM | POA: Diagnosis present

## 2016-08-15 DIAGNOSIS — F329 Major depressive disorder, single episode, unspecified: Secondary | ICD-10-CM | POA: Diagnosis present

## 2016-08-15 DIAGNOSIS — Z8041 Family history of malignant neoplasm of ovary: Secondary | ICD-10-CM

## 2016-08-15 DIAGNOSIS — G629 Polyneuropathy, unspecified: Secondary | ICD-10-CM | POA: Diagnosis present

## 2016-08-15 DIAGNOSIS — Z87891 Personal history of nicotine dependence: Secondary | ICD-10-CM

## 2016-08-15 DIAGNOSIS — M1711 Unilateral primary osteoarthritis, right knee: Secondary | ICD-10-CM | POA: Diagnosis not present

## 2016-08-15 DIAGNOSIS — E039 Hypothyroidism, unspecified: Secondary | ICD-10-CM | POA: Diagnosis not present

## 2016-08-15 DIAGNOSIS — I10 Essential (primary) hypertension: Secondary | ICD-10-CM | POA: Diagnosis not present

## 2016-08-15 DIAGNOSIS — E7800 Pure hypercholesterolemia, unspecified: Secondary | ICD-10-CM | POA: Diagnosis not present

## 2016-08-15 DIAGNOSIS — F411 Generalized anxiety disorder: Secondary | ICD-10-CM | POA: Diagnosis present

## 2016-08-15 DIAGNOSIS — M25561 Pain in right knee: Secondary | ICD-10-CM | POA: Diagnosis not present

## 2016-08-15 HISTORY — PX: TOTAL KNEE ARTHROPLASTY: SHX125

## 2016-08-15 SURGERY — ARTHROPLASTY, KNEE, TOTAL
Anesthesia: Spinal | Laterality: Right

## 2016-08-15 MED ORDER — FENTANYL CITRATE (PF) 100 MCG/2ML IJ SOLN: 25.0000 ug | INTRAMUSCULAR | Status: DC | PRN

## 2016-08-15 MED ORDER — CELECOXIB 200 MG PO CAPS
200.0000 mg | ORAL_CAPSULE | Freq: Two times a day (BID) | ORAL | Status: DC
  Administered 2016-08-15 – 2016-08-16 (×3): 200 mg via ORAL
  Filled 2016-08-15 (×3): qty 1

## 2016-08-15 MED ORDER — ONDANSETRON HCL 4 MG/2ML IJ SOLN
INTRAMUSCULAR | Status: DC | PRN
  Administered 2016-08-15: 4 mg via INTRAVENOUS

## 2016-08-15 MED ORDER — SODIUM CHLORIDE 0.9 % IJ SOLN
INTRAMUSCULAR | Status: DC | PRN
Start: 2016-08-15 — End: 2016-08-15
  Administered 2016-08-15: 20 mL

## 2016-08-15 MED ORDER — OXYCODONE HCL 5 MG PO TABS
5.0000 mg | ORAL_TABLET | ORAL | Status: DC | PRN
  Administered 2016-08-15: 10 mg via ORAL
  Administered 2016-08-15 (×2): 5 mg via ORAL
  Administered 2016-08-16 (×3): 10 mg via ORAL
  Filled 2016-08-15: qty 2
  Filled 2016-08-15: qty 1
  Filled 2016-08-15 (×3): qty 2

## 2016-08-15 MED ORDER — PHENOL 1.4 % MT LIQD: 1.0000 | OROMUCOSAL | Status: DC | PRN

## 2016-08-15 MED ORDER — LACTATED RINGERS IV SOLN
INTRAVENOUS | Status: DC | PRN
  Administered 2016-08-15 (×2): via INTRAVENOUS

## 2016-08-15 MED ORDER — HYDROMORPHONE HCL 2 MG/ML IJ SOLN: 1.0000 mg | INTRAMUSCULAR | Status: DC | PRN

## 2016-08-15 MED ORDER — BUPIVACAINE LIPOSOME 1.3 % IJ SUSP
20.0000 mL | INTRAMUSCULAR | Status: DC
  Filled 2016-08-15: qty 20

## 2016-08-15 MED ORDER — METOCLOPRAMIDE HCL 5 MG/ML IJ SOLN: 5.0000 mg | Freq: Three times a day (TID) | INTRAMUSCULAR | Status: DC | PRN

## 2016-08-15 MED ORDER — SERTRALINE HCL 50 MG PO TABS
50.0000 mg | ORAL_TABLET | Freq: Every day | ORAL | Status: DC
  Administered 2016-08-16: 50 mg via ORAL
  Filled 2016-08-15: qty 1

## 2016-08-15 MED ORDER — LIDOCAINE 2% (20 MG/ML) 5 ML SYRINGE
INTRAMUSCULAR | Status: DC | PRN
  Administered 2016-08-15: 40 mg via INTRAVENOUS

## 2016-08-15 MED ORDER — FENTANYL CITRATE (PF) 100 MCG/2ML IJ SOLN
INTRAMUSCULAR | Status: DC | PRN
  Administered 2016-08-15 (×2): 50 ug via INTRAVENOUS

## 2016-08-15 MED ORDER — ONDANSETRON HCL 4 MG/2ML IJ SOLN
INTRAMUSCULAR | Status: AC
  Filled 2016-08-15: qty 2

## 2016-08-15 MED ORDER — METOCLOPRAMIDE HCL 5 MG PO TABS
5.0000 mg | ORAL_TABLET | Freq: Three times a day (TID) | ORAL | Status: DC | PRN
Start: 2016-08-15 — End: 2016-08-16

## 2016-08-15 MED ORDER — BUPIVACAINE LIPOSOME 1.3 % IJ SUSP
INTRAMUSCULAR | Status: DC | PRN
  Administered 2016-08-15: 20 mL

## 2016-08-15 MED ORDER — MENTHOL 3 MG MT LOZG: 1.0000 | LOZENGE | OROMUCOSAL | Status: DC | PRN

## 2016-08-15 MED ORDER — PROPOFOL 10 MG/ML IV BOLUS
INTRAVENOUS | Status: DC | PRN
  Administered 2016-08-15: 20 mg via INTRAVENOUS

## 2016-08-15 MED ORDER — METHOCARBAMOL 1000 MG/10ML IJ SOLN
500.0000 mg | Freq: Four times a day (QID) | INTRAVENOUS | Status: DC | PRN
  Filled 2016-08-15: qty 5

## 2016-08-15 MED ORDER — BENAZEPRIL-HYDROCHLOROTHIAZIDE 20-12.5 MG PO TABS: 1.0000 | ORAL_TABLET | Freq: Every day | ORAL | Status: DC

## 2016-08-15 MED ORDER — CHLORHEXIDINE GLUCONATE 4 % EX LIQD: 60.0000 mL | Freq: Once | CUTANEOUS | Status: DC

## 2016-08-15 MED ORDER — DIPHENHYDRAMINE HCL 12.5 MG/5ML PO ELIX: 12.5000 mg | ORAL_SOLUTION | ORAL | Status: DC | PRN

## 2016-08-15 MED ORDER — DEXAMETHASONE SODIUM PHOSPHATE 10 MG/ML IJ SOLN
10.0000 mg | Freq: Once | INTRAMUSCULAR | Status: AC
  Administered 2016-08-16: 10 mg via INTRAVENOUS
  Filled 2016-08-15: qty 1

## 2016-08-15 MED ORDER — ASPIRIN EC 325 MG PO TBEC
325.0000 mg | DELAYED_RELEASE_TABLET | Freq: Two times a day (BID) | ORAL | Status: DC
  Administered 2016-08-15 – 2016-08-16 (×2): 325 mg via ORAL
  Filled 2016-08-15 (×2): qty 1

## 2016-08-15 MED ORDER — TRANEXAMIC ACID 1000 MG/10ML IV SOLN
1000.0000 mg | Freq: Once | INTRAVENOUS | Status: AC
  Administered 2016-08-15: 1000 mg via INTRAVENOUS
  Filled 2016-08-15: qty 10

## 2016-08-15 MED ORDER — BUPIVACAINE-EPINEPHRINE (PF) 0.5% -1:200000 IJ SOLN
INTRAMUSCULAR | Status: DC | PRN
  Administered 2016-08-15: 30 mL via PERINEURAL

## 2016-08-15 MED ORDER — SENNOSIDES-DOCUSATE SODIUM 8.6-50 MG PO TABS: 1.0000 | ORAL_TABLET | Freq: Every evening | ORAL | Status: DC | PRN

## 2016-08-15 MED ORDER — SODIUM CHLORIDE 0.9 % IV SOLN: INTRAVENOUS | Status: DC

## 2016-08-15 MED ORDER — MIDAZOLAM HCL 5 MG/5ML IJ SOLN
INTRAMUSCULAR | Status: DC | PRN
  Administered 2016-08-15 (×2): 1 mg via INTRAVENOUS

## 2016-08-15 MED ORDER — BUPIVACAINE HCL (PF) 0.25 % IJ SOLN
INTRAMUSCULAR | Status: AC
  Filled 2016-08-15: qty 30

## 2016-08-15 MED ORDER — ROPINIROLE HCL 0.5 MG PO TABS
0.2500 mg | ORAL_TABLET | Freq: Three times a day (TID) | ORAL | Status: DC
  Administered 2016-08-15 – 2016-08-16 (×2): 0.25 mg via ORAL
  Filled 2016-08-15 (×2): qty 1

## 2016-08-15 MED ORDER — METHOCARBAMOL 500 MG PO TABS
ORAL_TABLET | ORAL | Status: AC
  Filled 2016-08-15: qty 1

## 2016-08-15 MED ORDER — OXYCODONE-ACETAMINOPHEN 5-325 MG PO TABS
ORAL_TABLET | ORAL | Status: AC
  Filled 2016-08-15: qty 1

## 2016-08-15 MED ORDER — CLINDAMYCIN PHOSPHATE 600 MG/50ML IV SOLN
600.0000 mg | Freq: Four times a day (QID) | INTRAVENOUS | Status: AC
  Administered 2016-08-15 (×2): 600 mg via INTRAVENOUS
  Filled 2016-08-15 (×2): qty 50

## 2016-08-15 MED ORDER — SODIUM CHLORIDE 0.9 % IR SOLN
Status: DC | PRN
  Administered 2016-08-15: 3000 mL

## 2016-08-15 MED ORDER — OXYCODONE HCL 5 MG PO TABS
ORAL_TABLET | ORAL | Status: AC
  Filled 2016-08-15: qty 1

## 2016-08-15 MED ORDER — ACETAMINOPHEN 500 MG PO TABS
1000.0000 mg | ORAL_TABLET | Freq: Once | ORAL | Status: AC
  Administered 2016-08-15: 1000 mg via ORAL
  Filled 2016-08-15: qty 2

## 2016-08-15 MED ORDER — BENAZEPRIL HCL 5 MG PO TABS
10.0000 mg | ORAL_TABLET | Freq: Every day | ORAL | Status: DC
  Administered 2016-08-16: 10 mg via ORAL
  Filled 2016-08-15 (×2): qty 2

## 2016-08-15 MED ORDER — ONDANSETRON HCL 4 MG PO TABS: 4.0000 mg | ORAL_TABLET | Freq: Four times a day (QID) | ORAL | Status: DC | PRN

## 2016-08-15 MED ORDER — LEVOTHYROXINE SODIUM 75 MCG PO TABS
75.0000 ug | ORAL_TABLET | Freq: Every day | ORAL | Status: DC
  Administered 2016-08-16: 75 ug via ORAL
  Filled 2016-08-15: qty 1

## 2016-08-15 MED ORDER — PHENYLEPHRINE HCL 10 MG/ML IJ SOLN
INTRAMUSCULAR | Status: DC | PRN
  Administered 2016-08-15 (×2): 40 ug via INTRAVENOUS

## 2016-08-15 MED ORDER — ACETAMINOPHEN 325 MG PO TABS: 650.0000 mg | ORAL_TABLET | Freq: Four times a day (QID) | ORAL | Status: DC | PRN

## 2016-08-15 MED ORDER — SODIUM CHLORIDE 0.9 % IV SOLN
INTRAVENOUS | Status: DC
  Administered 2016-08-15: 14:00:00 via INTRAVENOUS

## 2016-08-15 MED ORDER — MIDAZOLAM HCL 2 MG/2ML IJ SOLN
INTRAMUSCULAR | Status: AC
  Filled 2016-08-15: qty 2

## 2016-08-15 MED ORDER — LIDOCAINE 2% (20 MG/ML) 5 ML SYRINGE
INTRAMUSCULAR | Status: AC
  Filled 2016-08-15: qty 5

## 2016-08-15 MED ORDER — ALUM & MAG HYDROXIDE-SIMETH 200-200-20 MG/5ML PO SUSP: 30.0000 mL | ORAL | Status: DC | PRN

## 2016-08-15 MED ORDER — METHOCARBAMOL 500 MG PO TABS
500.0000 mg | ORAL_TABLET | Freq: Four times a day (QID) | ORAL | Status: DC | PRN
  Administered 2016-08-15 – 2016-08-16 (×4): 500 mg via ORAL
  Filled 2016-08-15 (×3): qty 1

## 2016-08-15 MED ORDER — BISACODYL 5 MG PO TBEC: 5.0000 mg | DELAYED_RELEASE_TABLET | Freq: Every day | ORAL | Status: DC | PRN

## 2016-08-15 MED ORDER — PROPOFOL 500 MG/50ML IV EMUL
INTRAVENOUS | Status: DC | PRN
  Administered 2016-08-15: 75 ug/kg/min via INTRAVENOUS

## 2016-08-15 MED ORDER — HYDROCHLOROTHIAZIDE 12.5 MG PO CAPS
12.5000 mg | ORAL_CAPSULE | Freq: Every day | ORAL | Status: DC
  Administered 2016-08-16: 12.5 mg via ORAL
  Filled 2016-08-15 (×2): qty 1

## 2016-08-15 MED ORDER — DEXAMETHASONE SODIUM PHOSPHATE 10 MG/ML IJ SOLN
8.0000 mg | Freq: Once | INTRAMUSCULAR | Status: AC
  Administered 2016-08-15: 8 mg via INTRAVENOUS
  Filled 2016-08-15: qty 1

## 2016-08-15 MED ORDER — GABAPENTIN 100 MG PO CAPS
100.0000 mg | ORAL_CAPSULE | Freq: Three times a day (TID) | ORAL | Status: DC
  Administered 2016-08-15 – 2016-08-16 (×3): 100 mg via ORAL
  Filled 2016-08-15 (×3): qty 1

## 2016-08-15 MED ORDER — ZOLPIDEM TARTRATE 5 MG PO TABS
5.0000 mg | ORAL_TABLET | Freq: Every evening | ORAL | Status: DC | PRN
  Administered 2016-08-15: 5 mg via ORAL
  Filled 2016-08-15: qty 1

## 2016-08-15 MED ORDER — FLEET ENEMA 7-19 GM/118ML RE ENEM: 1.0000 | ENEMA | Freq: Once | RECTAL | Status: DC | PRN

## 2016-08-15 MED ORDER — FENTANYL CITRATE (PF) 100 MCG/2ML IJ SOLN
INTRAMUSCULAR | Status: AC
  Filled 2016-08-15: qty 2

## 2016-08-15 MED ORDER — PHENYLEPHRINE 40 MCG/ML (10ML) SYRINGE FOR IV PUSH (FOR BLOOD PRESSURE SUPPORT)
PREFILLED_SYRINGE | INTRAVENOUS | Status: AC
  Filled 2016-08-15: qty 10

## 2016-08-15 MED ORDER — OXYCODONE HCL ER 10 MG PO T12A
10.0000 mg | EXTENDED_RELEASE_TABLET | Freq: Two times a day (BID) | ORAL | Status: DC
  Administered 2016-08-15 – 2016-08-16 (×3): 10 mg via ORAL
  Filled 2016-08-15 (×3): qty 1

## 2016-08-15 MED ORDER — PROPOFOL 10 MG/ML IV BOLUS
INTRAVENOUS | Status: AC
  Filled 2016-08-15: qty 20

## 2016-08-15 MED ORDER — CLINDAMYCIN PHOSPHATE 900 MG/50ML IV SOLN
900.0000 mg | INTRAVENOUS | Status: AC
  Administered 2016-08-15: 900 mg via INTRAVENOUS
  Filled 2016-08-15: qty 50

## 2016-08-15 MED ORDER — ALPRAZOLAM 0.25 MG PO TABS: 0.2500 mg | ORAL_TABLET | Freq: Two times a day (BID) | ORAL | Status: DC | PRN

## 2016-08-15 MED ORDER — BUPIVACAINE HCL (PF) 0.25 % IJ SOLN
INTRAMUSCULAR | Status: DC | PRN
  Administered 2016-08-15: 20 mL

## 2016-08-15 MED ORDER — BUPIVACAINE IN DEXTROSE 0.75-8.25 % IT SOLN
INTRATHECAL | Status: DC | PRN
  Administered 2016-08-15: 1.5 mL via INTRATHECAL

## 2016-08-15 MED ORDER — MEPERIDINE HCL 25 MG/ML IJ SOLN: 6.2500 mg | INTRAMUSCULAR | Status: DC | PRN

## 2016-08-15 MED ORDER — METOCLOPRAMIDE HCL 5 MG/ML IJ SOLN: 10.0000 mg | Freq: Once | INTRAMUSCULAR | Status: DC | PRN

## 2016-08-15 MED ORDER — ONDANSETRON HCL 4 MG/2ML IJ SOLN: 4.0000 mg | Freq: Four times a day (QID) | INTRAMUSCULAR | Status: DC | PRN

## 2016-08-15 MED ORDER — DOCUSATE SODIUM 100 MG PO CAPS
100.0000 mg | ORAL_CAPSULE | Freq: Two times a day (BID) | ORAL | Status: DC
  Administered 2016-08-15 – 2016-08-16 (×2): 100 mg via ORAL
  Filled 2016-08-15 (×2): qty 1

## 2016-08-15 MED ORDER — ACETAMINOPHEN 650 MG RE SUPP: 650.0000 mg | Freq: Four times a day (QID) | RECTAL | Status: DC | PRN

## 2016-08-15 SURGICAL SUPPLY — 55 items
BANDAGE ACE 6X5 VEL STRL LF (GAUZE/BANDAGES/DRESSINGS) ×2 IMPLANT
BANDAGE ESMARK 6X9 LF (GAUZE/BANDAGES/DRESSINGS) ×1 IMPLANT
BLADE SAGITTAL 13X1.27X60 (BLADE) ×2 IMPLANT
BLADE SAW SGTL 83.5X18.5 (BLADE) ×2 IMPLANT
BLADE SURG 10 STRL SS (BLADE) ×2 IMPLANT
BNDG ESMARK 6X9 LF (GAUZE/BANDAGES/DRESSINGS) ×2
BOWL SMART MIX CTS (DISPOSABLE) ×2 IMPLANT
CAPT KNEE TOTAL 3 ×2 IMPLANT
CEMENT BONE SIMPLEX SPEEDSET (Cement) ×4 IMPLANT
COVER SURGICAL LIGHT HANDLE (MISCELLANEOUS) ×2 IMPLANT
CUFF TOURNIQUET SINGLE 34IN LL (TOURNIQUET CUFF) ×2 IMPLANT
DRAPE EXTREMITY T 121X128X90 (DRAPE) ×2 IMPLANT
DRAPE HALF SHEET 40X57 (DRAPES) ×2 IMPLANT
DRAPE INCISE IOBAN 66X45 STRL (DRAPES) ×4 IMPLANT
DRAPE U-SHAPE 47X51 STRL (DRAPES) ×2 IMPLANT
DRSG AQUACEL AG ADV 3.5X10 (GAUZE/BANDAGES/DRESSINGS) ×2 IMPLANT
DURAPREP 26ML APPLICATOR (WOUND CARE) ×4 IMPLANT
ELECT REM PT RETURN 9FT ADLT (ELECTROSURGICAL) ×2
ELECTRODE REM PT RTRN 9FT ADLT (ELECTROSURGICAL) ×1 IMPLANT
GLOVE BIOGEL M 7.0 STRL (GLOVE) IMPLANT
GLOVE BIOGEL PI IND STRL 7.5 (GLOVE) IMPLANT
GLOVE BIOGEL PI IND STRL 8.5 (GLOVE) ×1 IMPLANT
GLOVE BIOGEL PI INDICATOR 7.5 (GLOVE)
GLOVE BIOGEL PI INDICATOR 8.5 (GLOVE) ×1
GLOVE SURG ORTHO 8.0 STRL STRW (GLOVE) ×4 IMPLANT
GOWN STRL REUS W/ TWL LRG LVL3 (GOWN DISPOSABLE) ×1 IMPLANT
GOWN STRL REUS W/ TWL XL LVL3 (GOWN DISPOSABLE) ×2 IMPLANT
GOWN STRL REUS W/TWL 2XL LVL3 (GOWN DISPOSABLE) ×2 IMPLANT
GOWN STRL REUS W/TWL LRG LVL3 (GOWN DISPOSABLE) ×1
GOWN STRL REUS W/TWL XL LVL3 (GOWN DISPOSABLE) ×2
HANDPIECE INTERPULSE COAX TIP (DISPOSABLE) ×1
HOOD PEEL AWAY FACE SHEILD DIS (HOOD) ×6 IMPLANT
KIT BASIN OR (CUSTOM PROCEDURE TRAY) ×2 IMPLANT
KIT ROOM TURNOVER OR (KITS) ×2 IMPLANT
KNEE CAPITATED TOTAL 3 ×1 IMPLANT
MANIFOLD NEPTUNE II (INSTRUMENTS) ×2 IMPLANT
NEEDLE 22X1 1/2 (OR ONLY) (NEEDLE) ×4 IMPLANT
NS IRRIG 1000ML POUR BTL (IV SOLUTION) ×2 IMPLANT
PACK TOTAL JOINT (CUSTOM PROCEDURE TRAY) ×2 IMPLANT
PAD ARMBOARD 7.5X6 YLW CONV (MISCELLANEOUS) IMPLANT
SET HNDPC FAN SPRY TIP SCT (DISPOSABLE) ×1 IMPLANT
STRIP CLOSURE SKIN 1/2X4 (GAUZE/BANDAGES/DRESSINGS) ×2 IMPLANT
SUCTION FRAZIER HANDLE 10FR (MISCELLANEOUS)
SUCTION TUBE FRAZIER 10FR DISP (MISCELLANEOUS) IMPLANT
SUT MNCRL AB 3-0 PS2 18 (SUTURE) ×2 IMPLANT
SUT VIC AB 0 CTB1 27 (SUTURE) ×4 IMPLANT
SUT VIC AB 1 CT1 27 (SUTURE) ×2
SUT VIC AB 1 CT1 27XBRD ANBCTR (SUTURE) ×2 IMPLANT
SUT VIC AB 2-0 CT1 27 (SUTURE) ×2
SUT VIC AB 2-0 CT1 TAPERPNT 27 (SUTURE) ×2 IMPLANT
SYR 20CC LL (SYRINGE) ×4 IMPLANT
TOWEL OR 17X24 6PK STRL BLUE (TOWEL DISPOSABLE) ×2 IMPLANT
TOWEL OR 17X26 10 PK STRL BLUE (TOWEL DISPOSABLE) ×2 IMPLANT
TRAY CATH 16FR W/PLASTIC CATH (SET/KITS/TRAYS/PACK) IMPLANT
WRAP KNEE MAXI GEL POST OP (GAUZE/BANDAGES/DRESSINGS) ×2 IMPLANT

## 2016-08-15 NOTE — Progress Notes (Signed)
Orthopedic Tech Progress Note Patient Details:  Phyllis Walker 08-02-1946 [id number]  Ortho Devices Ortho Device/Splint Location: footsie roll Ortho Device/Splint Interventions: Criss Alvine 08/15/2016, 3:50 PM

## 2016-08-15 NOTE — Anesthesia Procedure Notes (Addendum)
Procedure Name: MAC Date/Time: 08/15/2016 7:32 AM Performed by: Candis Shine Pre-anesthesia Checklist: Patient identified, Emergency Drugs available, Suction available, Patient being monitored and Timeout performed Patient Re-evaluated:Patient Re-evaluated prior to inductionOxygen Delivery Method: Simple face mask Dental Injury: Teeth and Oropharynx as per pre-operative assessment

## 2016-08-15 NOTE — Progress Notes (Signed)
Orthopedic Tech Progress Note Patient Details:  Phyllis Walker February 14, 1946 [id number] Put on cpm at 1825 Patient ID: Phyllis Walker, female   DOB: 1945-11-22, 70 y.o.   MRN: [id number]   Phyllis Walker 08/15/2016, 6:27 PM

## 2016-08-15 NOTE — Transfer of Care (Signed)
Immediate Anesthesia Transfer of Care Note  Patient: Phyllis Walker  Procedure(s) Performed: Procedure(s) with comments: TOTAL KNEE ARTHROPLASTY (Right) - RNFA  Patient Location: PACU  Anesthesia Type:MAC and Spinal  Level of Consciousness: awake, alert  and oriented  Airway & Oxygen Therapy: Patient Spontanous Breathing  Post-op Assessment: Report given to RN and Post -op Vital signs reviewed and stable  Post vital signs: Reviewed and stable  Last Vitals:  Vitals:   08/15/16 0621  BP: (!) 156/74  Pulse: 84  Resp: 20  Temp: 36.7 C    Last Pain: There were no vitals filed for this visit.    Patients Stated Pain Goal: 3 ([id number])  Complications: No apparent anesthesia complications

## 2016-08-15 NOTE — H&P (Signed)
Phyllis Walker MRN:  [id number] DOB/SEX:  02/08/1946/female  CHIEF COMPLAINT:  Painful right Knee  HISTORY: Patient is a 70 y.o. female presented with a history of pain in the right knee. Onset of symptoms was gradual starting a few years ago with gradually worsening course since that time. Patient has been treated conservatively with over-the-counter NSAIDs and activity modification. Patient currently rates pain in the knee at 10 out of 10 with activity. There is pain at night.  PAST MEDICAL HISTORY: Patient Active Problem List   Diagnosis Date Noted  . Other seasonal allergic rhinitis 12/21/2015  . BMI 31.0-31.9,adult 06/19/2015  . Anemia 05/27/2015  . Insomnia 03/09/2015  . Osteoporosis with pathological fracture of ankle and foot 05/22/2014  . Hypothyroidism 05/12/2014  . Hyperlipidemia with target LDL less than 100 05/12/2014  . Restless leg syndrome 02/21/2014  . Neuropathy ([place]) 02/21/2014  . GAD (generalized anxiety disorder) 12/21/2012  . Hypertension, benign essential, goal below 140/90 12/21/2012  . Depression 12/21/2012   Past Medical History:  Diagnosis Date  . Anemia   . Anxiety   . Arthritis   . Cataract   . Depression   . Fibroid tumor 1985   in breast  . History of hiatal hernia   . Hypercholesteremia   . Hypertension   . Hypothyroidism   . Neuropathy ([place])    in feet  . Restless leg syndrome    Past Surgical History:  Procedure Laterality Date  . BREAST LUMPECTOMY  1987   benign fibroid tumor  . BUNIONECTOMY Bilateral   . CATARACT EXTRACTION    . COLONOSCOPY    . THYROIDECTOMY, PARTIAL  1969  . TONSILLECTOMY       MEDICATIONS:   Prescriptions Prior to Admission  Medication Sig Dispense Refill Last Dose  . ALPRAZolam (XANAX) 0.25 MG tablet TAKE  (1)  TABLET TWICE A DAY AS NEEDED. (Patient taking differently: TAKE  (1)  TABLET TWICE A DAY AS NEEDED FOR ANXIETY) 60 tablet 1   . benazepril-hydrochlorthiazide (LOTENSIN HCT) 20-12.5 MG tablet Take 1  tablet by mouth daily. (Patient taking differently: Take 1 tablet by mouth daily. Patient is taking 1/2 tab daily) 90 tablet 1 Taking  . cetirizine (ZYRTEC) 10 MG tablet Take 10 mg by mouth at bedtime.     [name] FERROUS SULFATE PO Take 1 tablet by mouth daily.     [name] gabapentin (NEURONTIN) 100 MG capsule TAKE (1) CAPSULE THREE TIMES DAILY. 90 capsule 5 Taking  . levothyroxine (SYNTHROID, LEVOTHROID) 75 MCG tablet Take 1 tablet (75 mcg total) by mouth daily. 90 tablet 2 Taking  . Omega-3 Fatty Acids (FISH OIL PO) Take 2 tablets by mouth daily.   08/06/2016  . rOPINIRole (REQUIP) 0.5 MG tablet Take 1 tablet (0.5 mg total) by mouth at bedtime. (Patient taking differently: Take 0.25 mg by mouth 3 (three) times daily. ) 90 tablet 1 Taking  . sertraline (ZOLOFT) 50 MG tablet Take 1 tablet by mouth  daily 90 tablet 1 Taking  . traMADol (ULTRAM) 50 MG tablet TAKE  (1)  TABLET TWICE A DAY. (Patient taking differently: Take 50mg s three times daily) 60 tablet 0 Taking  . zolpidem (AMBIEN) 10 MG tablet Take 1 tablet (10 mg total) by mouth at bedtime as needed. for sleep (Patient taking differently: Take 5 mg by mouth at bedtime. for sleep) 30 tablet 1   . HYDROcodone-acetaminophen (NORCO/VICODIN) 5-325 MG tablet Take 1 tablet by mouth every 6 (six) hours as needed for moderate pain.  ALLERGIES:   Allergies  Allergen Reactions  . Amoxicillin-Pot Clavulanate Nausea And Vomiting and Other (See Comments)    STOMACH CRAMPS  Has patient had a PCN reaction causing immediate rash, facial/tongue/throat swelling, SOB or lightheadedness with hypotension: No Has patient had a PCN reaction causing severe rash involving mucus membranes or skin necrosis: No Has patient had a PCN reaction that required hospitalization No Has patient had a PCN reaction occurring within the last 10 years: Yes If all of the above answers are "NO", then may proceed with Cephalosporin use.     REVIEW OF SYSTEMS:  A comprehensive review  of systems was negative except for: Musculoskeletal: positive for arthralgias and bone pain   FAMILY HISTORY:   Family History  Problem Relation Age of Onset  . Cancer Mother     pancreas  . Heart attack Father   . Hypertension Sister   . Hyperlipidemia Sister   . Hypertension Sister   . Hyperlipidemia Sister   . Early death Sister 23    accident  . Ovarian cancer Maternal Aunt     SOCIAL HISTORY:   Social History  Substance Use Topics  . Smoking status: Former Smoker    Types: Cigarettes    Quit date: 12/21/2006  . Smokeless tobacco: Never Used  . Alcohol use No     EXAMINATION:  Vital signs in last 24 hours:    There were no vitals taken for this visit.  General Appearance:    Alert, cooperative, no distress, appears stated age  Head:    Normocephalic, without obvious abnormality, atraumatic  Eyes:    PERRL, conjunctiva/corneas clear, EOM's intact, fundi    benign, both eyes  Ears:    Normal TM's and external ear canals, both ears  Nose:   Nares normal, septum midline, mucosa normal, no drainage    or sinus tenderness  Throat:   Lips, mucosa, and tongue normal; teeth and gums normal  Neck:   Supple, symmetrical, trachea midline, no adenopathy;    thyroid:  no enlargement/tenderness/nodules; no carotid   bruit or JVD  Back:     Symmetric, no curvature, ROM normal, no CVA tenderness  Lungs:     Clear to auscultation bilaterally, respirations unlabored  Chest Wall:    No tenderness or deformity   Heart:    Regular rate and rhythm, S1 and S2 normal, no murmur, rub   or gallop  Breast Exam:    No tenderness, masses, or nipple abnormality  Abdomen:     Soft, non-tender, bowel sounds active all four quadrants,    no masses, no organomegaly  Genitalia:    Normal female without lesion, discharge or tenderness  Rectal:    Normal tone, no masses or tenderness;   guaiac negative stool  Extremities:   Extremities normal, atraumatic, no cyanosis or edema  Pulses:   2+ and  symmetric all extremities  Skin:   Skin color, texture, turgor normal, no rashes or lesions  Lymph nodes:   Cervical, supraclavicular, and axillary nodes normal  Neurologic:   CNII-XII intact, normal strength, sensation and reflexes    throughout     Musculoskeletal:  ROM 0-120, Ligaments intact,  Imaging Review Plain radiographs demonstrate severe degenerative joint disease of the right knee. The overall alignment is neutral. The bone quality appears to be good for age and reported activity level.  Assessment/Plan: Primary osteoarthritis, right knee   The patient history, physical examination and imaging studies are consistent with advanced degenerative joint disease  of the right knee. The patient has failed conservative treatment.  The clearance notes were reviewed.  After discussion with the patient it was felt that Total Knee Replacement was indicated. The procedure,  risks, and benefits of total knee arthroplasty were presented and reviewed. The risks including but not limited to aseptic loosening, infection, blood clots, vascular injury, stiffness, patella tracking problems complications among others were discussed. The patient acknowledged the explanation, agreed to proceed with the plan. Donia Ast 08/15/2016, 6:20 AM

## 2016-08-15 NOTE — Anesthesia Procedure Notes (Addendum)
Anesthesia Regional Block:  Adductor canal block  Pre-Anesthetic Checklist: ,, timeout performed, Correct Patient, Correct Site, Correct Laterality, Correct Procedure, Correct Position, site marked, Risks and benefits discussed,  Surgical consent,  Pre-op evaluation,  At surgeon's request and post-op pain management  Laterality: Right and Lower  Prep: Maximum Sterile Barrier Precautions used, chloraprep       Needles:  Injection technique: Single-shot  Needle Type: Echogenic Stimulator Needle     Needle Length: 10cm 10 cm Needle Gauge: 21 G    Additional Needles:  Procedures: ultrasound guided (picture in chart) Adductor canal block Narrative:  Start time: 08/15/2016 6:55 AM End time: 08/15/2016 7:11 AM Injection made incrementally with aspirations every 5 mL.  Performed by: Personally  Anesthesiologist: Montez Hageman  Additional Notes: Risks, benefits and alternative to block explained extensively.  Patient tolerated procedure well, without complications.

## 2016-08-15 NOTE — Evaluation (Signed)
Physical Therapy Evaluation Patient Details Name: Phyllis Walker MRN: [id number] DOB: 1946/05/13 Today's Date: 08/15/2016   History of Present Illness  Admitted for R TKA, WBAT  Clinical Impression  Pt is s/p TKA resulting in the deficits listed below (see PT Problem List). Moving very well; nice stable knee in stance; on track for dc home tomorrow;  Pt will benefit from skilled PT to increase their independence and safety with mobility to allow discharge to the venue listed below.      Follow Up Recommendations Home health PT;Supervision - Intermittent    Equipment Recommendations  Rolling walker with 5" wheels;3in1 (PT) (may already have)    Recommendations for Other Services       Precautions / Restrictions Precautions Precautions: Knee Precaution Booklet Issued: Yes (comment) Precaution Comments: Pt educated to not allow any pillow or bolster under knee for healing with optimal range of motion.  Restrictions RLE Weight Bearing: Weight bearing as tolerated      Mobility  Bed Mobility Overal bed mobility: Needs Assistance Bed Mobility: Supine to Sit     Supine to sit: Supervision     General bed mobility comments: Used bed rails  Transfers Overall transfer level: Needs assistance Equipment used: Rolling walker (2 wheeled) Transfers: Sit to/from Stand Sit to Stand: Min guard         General transfer comment: cues for hand placement and safety  Ambulation/Gait Ambulation/Gait assistance: Min guard;Supervision Ambulation Distance (Feet): 550 Feet Assistive device: Rolling walker (2 wheeled) Gait Pattern/deviations: Step-through pattern   Gait velocity interpretation: at or above normal speed for age/gender General Gait Details: Minguard assist progressing to Supervision; cues to activate quad for R knee stability in stance  Stairs            Wheelchair Mobility    Modified Rankin (Stroke Patients Only)       Balance                                             Pertinent Vitals/Pain Pain Assessment: 0-10 Pain Score: 1  Pain Location: R knee end of session Pain Descriptors / Indicators: Aching Pain Intervention(s): Monitored during session    Home Living Family/patient expects to be discharged to:: Private residence Living Arrangements: Alone Available Help at Discharge: Family;Available PRN/intermittently Type of Home: House Home Access: Stairs to enter Entrance Stairs-Rails: None Entrance Stairs-Number of Steps: 2 Home Layout: One level Home Equipment: Walker - 2 wheels;Bedside commode (Need to verify that she has these)      Prior Function Level of Independence: Independent         Comments: enjoys traveling     Hand Dominance        Extremity/Trunk Assessment   Upper Extremity Assessment: Overall WFL for tasks assessed           Lower Extremity Assessment: RLE deficits/detail RLE Deficits / Details: Noting very good quad activation, able to perform straight leg raise with minimal lag; AROM grossly 0-95 deg; Nice, stable knee in stance       Communication   Communication: No difficulties  Cognition Arousal/Alertness: Awake/alert Behavior During Therapy: WFL for tasks assessed/performed Overall Cognitive Status: Within Functional Limits for tasks assessed                      General Comments      Exercises Total  Joint Exercises Quad Sets: AROM;Right;10 reps Short Arc Quad: AROM;Right;10 reps Heel Slides: AROM;Right;10 reps Straight Leg Raises: AROM;Right;10 reps Long Arc Quad: AROM;Right;5 reps   Assessment/Plan    PT Assessment Patient needs continued PT services  PT Problem List Decreased strength;Decreased range of motion;Decreased knowledge of precautions;Pain;Decreased knowledge of use of DME;Decreased mobility          PT Treatment Interventions DME instruction;Gait training;Stair training;Functional mobility training;Therapeutic  activities;Therapeutic exercise;Patient/family education    PT Goals (Current goals can be found in the Care Plan section)  Acute Rehab PT Goals Patient Stated Goal: back to traveling PT Goal Formulation: With patient Time For Goal Achievement: 08/22/16 Potential to Achieve Goals: Good    Frequency 7X/week   Barriers to discharge        Co-evaluation               End of Session Equipment Utilized During Treatment: Gait belt Activity Tolerance: Patient tolerated treatment well Patient left: in chair;with call bell/phone within reach Nurse Communication: Mobility status         Time: 1202-1237 PT Time Calculation (min) (ACUTE ONLY): 35 min   Charges:   PT Evaluation $PT Eval Low Complexity: 1 Procedure PT Treatments $Gait Training: 8-22 mins   PT G Codes:        Colletta Maryland 08/15/2016, 3:22 PM  Roney Marion, PT  Acute Rehabilitation Services Pager [phone] Office [phone]

## 2016-08-15 NOTE — Anesthesia Procedure Notes (Signed)
Spinal  Patient location during procedure: OR Staffing Anesthesiologist: Montez Hageman Performed: anesthesiologist  Preanesthetic Checklist Completed: patient identified, site marked, surgical consent, pre-op evaluation, timeout performed, IV checked, risks and benefits discussed and monitors and equipment checked Spinal Block Patient position: sitting Prep: Betadine Patient monitoring: heart rate, continuous pulse ox and blood pressure Approach: right paramedian Location: L4-5 Injection technique: single-shot Needle Needle type: Sprotte  Needle gauge: 24 G Needle length: 9 cm Additional Notes Expiration date of kit checked and confirmed. Patient tolerated procedure well, without complications.

## 2016-08-15 NOTE — Anesthesia Preprocedure Evaluation (Signed)
Anesthesia Evaluation  Patient identified by MRN, date of birth, ID band Patient awake    Reviewed: Allergy & Precautions, NPO status , Patient's Chart, lab work & pertinent test results  Airway Mallampati: II  TM Distance: >3 FB Neck ROM: Full    Dental no notable dental hx. (+) Upper Dentures   Pulmonary neg pulmonary ROS, former smoker,    Pulmonary exam normal breath sounds clear to auscultation       Cardiovascular hypertension, Pt. 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    GI/Hepatic Neg liver ROS, hiatal hernia,   Endo/Other  Hypothyroidism   Renal/GU negative Renal ROS  negative genitourinary   Musculoskeletal negative musculoskeletal ROS (+)   Abdominal   Peds negative pediatric ROS (+)  Hematology negative hematology ROS (+)   Anesthesia Other Findings   Reproductive/Obstetrics negative OB ROS                            Anesthesia Physical Anesthesia Plan  ASA: II  Anesthesia Plan: Spinal   Post-op Pain Management:  Regional for Post-op pain   Induction: Intravenous  Airway Management Planned: Simple Face Mask  Additional Equipment:   Intra-op Plan:   Post-operative Plan:   Informed Consent: I have reviewed the patients History and Physical, chart, labs and discussed the procedure including the risks, benefits and alternatives for the proposed anesthesia with the patient or authorized representative who has indicated his/her understanding and acceptance.   Dental advisory given  Plan Discussed with: CRNA  Anesthesia Plan Comments: (SAB plus adductor)        Anesthesia Quick Evaluation

## 2016-08-15 NOTE — Progress Notes (Signed)
Orthopedic Tech Progress Note Patient Details:  MARRIE PAUSTIAN 05/10/1946 [id number]  CPM Right Knee CPM Right Knee: On Right Knee Flexion (Degrees): 90 Right Knee Extension (Degrees): 0 Additional Comments: trapeze bar patient helper   Hildred Priest 08/15/2016, 10:27 AM viewed order from doctor's order list; footsie roll will be provided when it comes into our stock; RN notified

## 2016-08-15 NOTE — Anesthesia Postprocedure Evaluation (Signed)
Anesthesia Post Note  Patient: Phyllis Walker  Procedure(s) Performed: Procedure(s) (LRB): TOTAL KNEE ARTHROPLASTY (Right)  Patient location during evaluation: PACU Anesthesia Type: Spinal and Regional Level of consciousness: awake and alert Pain management: pain level controlled Vital Signs Assessment: post-procedure vital signs reviewed and stable Respiratory status: spontaneous breathing and respiratory function stable Cardiovascular status: blood pressure returned to baseline and stable Postop Assessment: no headache, no backache and spinal receding Anesthetic complications: no    Last Vitals:  Vitals:   08/15/16 1030 08/15/16 1300  BP: (!) 148/78 139/60  Pulse: 67 80  Resp: 16   Temp: 36.4 C 36.6 C    Last Pain:  Vitals:   08/15/16 1117  PainSc: 4                  Montez Hageman

## 2016-08-16 ENCOUNTER — Encounter (HOSPITAL_COMMUNITY): Payer: Self-pay | Admitting: Orthopedic Surgery

## 2016-08-16 LAB — BASIC METABOLIC PANEL
ANION GAP: 6 (ref 5–15)
BUN: 13 mg/dL (ref 6–20)
CALCIUM: 8.3 mg/dL — AB (ref 8.9–10.3)
CO2: 27 mmol/L (ref 22–32)
Chloride: 106 mmol/L (ref 101–111)
Creatinine, Ser: 0.73 mg/dL (ref 0.44–1.00)
GLUCOSE: 102 mg/dL — AB (ref 65–99)
Potassium: 4.1 mmol/L (ref 3.5–5.1)
Sodium: 139 mmol/L (ref 135–145)

## 2016-08-16 LAB — CBC
HCT: 29.2 % — ABNORMAL LOW (ref 36.0–46.0)
Hemoglobin: 9.3 g/dL — ABNORMAL LOW (ref 12.0–15.0)
MCH: 27.8 pg (ref 26.0–34.0)
MCHC: 31.8 g/dL (ref 30.0–36.0)
MCV: 87.2 fL (ref 78.0–100.0)
PLATELETS: 196 10*3/uL (ref 150–400)
RBC: 3.35 MIL/uL — ABNORMAL LOW (ref 3.87–5.11)
RDW: 14.3 % (ref 11.5–15.5)
WBC: 9.3 10*3/uL (ref 4.0–10.5)

## 2016-08-16 MED ORDER — OXYCODONE HCL 10 MG PO TABS: 10.0000 mg | ORAL_TABLET | ORAL | 0 refills | Status: DC | PRN

## 2016-08-16 MED ORDER — METHOCARBAMOL 500 MG PO TABS: 500.0000 mg | ORAL_TABLET | Freq: Four times a day (QID) | ORAL | 0 refills | Status: DC | PRN

## 2016-08-16 MED ORDER — ASPIRIN 325 MG PO TBEC: 325.0000 mg | DELAYED_RELEASE_TABLET | Freq: Two times a day (BID) | ORAL | 0 refills | Status: DC

## 2016-08-16 NOTE — Progress Notes (Signed)
SPORTS MEDICINE AND JOINT REPLACEMENT  Lara Mulch, MD    Carlyon Shadow, PA-C [address], [place], [place]  [address]                             [phone]   PROGRESS NOTE  Subjective:  negative for Chest Pain  negative for Shortness of Breath  negative for Nausea/Vomiting   negative for Calf Pain  negative for Bowel Movement   Tolerating Diet: yes         Patient reports pain as 3 on 0-10 scale.    Objective: Vital signs in last 24 hours:   Patient Vitals for the past 24 hrs:  BP Temp Temp src Pulse Resp SpO2  08/16/16 0556 127/67 98.2 F (36.8 C) Oral 71 17 99 %  08/15/16 2331 (!) 112/58 98 F (36.7 C) Oral 80 17 97 %  08/15/16 2056 (!) 123/43 98.2 F (36.8 C) Oral 68 17 96 %  08/15/16 1300 139/60 97.9 F (36.6 C) - 80 - 100 %  08/15/16 1030 (!) 148/78 97.5 F (36.4 C) - 67 16 95 %  08/15/16 1015 118/61 - - 67 17 95 %  08/15/16 1000 140/75 - - 69 16 95 %  08/15/16 0945 136/67 - - 72 16 94 %  08/15/16 0930 (!) 123/96 97.5 F (36.4 C) - 69 13 98 %    @flow {1959:LAST@   Intake/Output from previous day:   12/04 0701 - 12/05 0700 In: 1100 [I.V.:1100] Out: 400 [Urine:250]   Intake/Output this shift:   No intake/output data recorded.   Intake/Output      12/04 0701 - 12/05 0700 12/05 0701 - 12/06 0700   I.V. (mL/kg) 1100 (13.7)    Total Intake(mL/kg) 1100 (13.7)    Urine (mL/kg/hr) 250 (0.1)    Blood 150 (0.1)    Total Output 400     Net +700          Urine Occurrence 3 x       LABORATORY DATA:  Recent Labs  08/16/16 0442  WBC 9.3  HGB 9.3*  HCT 29.2*  PLT 196    Recent Labs  08/16/16 0442  NA 139  K 4.1  CL 106  CO2 27  BUN 13  CREATININE 0.73  GLUCOSE 102*  CALCIUM 8.3*   Lab Results  Component Value Date   INR 1.09 08/08/2016   INR 1.09 01/17/2012    Examination:  General appearance: alert, cooperative and no distress Extremities: extremities normal, atraumatic, no cyanosis or edema  Wound Exam: clean, dry,  intact   Drainage:  None: wound tissue dry  Motor Exam: Quadriceps and Hamstrings Intact  Sensory Exam: Superficial Peroneal, Deep Peroneal and Tibial normal   Assessment:    1 Day Post-Op  Procedure(s) (LRB): TOTAL KNEE ARTHROPLASTY (Right)  ADDITIONAL DIAGNOSIS:  Active Problems:   S/P total knee replacement     Plan: Physical Therapy as ordered Weight Bearing as Tolerated (WBAT)  DVT Prophylaxis:  Aspirin  DISCHARGE PLAN: Home  DISCHARGE NEEDS: HHPT   Patient doing great, will D/C today         Donia Ast 08/16/2016, 7:29 AM

## 2016-08-16 NOTE — Care Management Note (Signed)
Case Management Note  Patient Details  Name: Phyllis Walker MRN: [id number] Date of Birth: [birth year]  Subjective/Objective:    70 yr old female s/p right total knee arthroplasty.                Action/Plan: Patient was preoperatively setup with [facility], no changes. DME to be delivered to her home. Patient will have family support at discharge.    Expected Discharge Date:    08/16/16              Expected Discharge Plan:  [place]  In-House Referral:  NA  Discharge planning Services  CM Consult  Post Acute Care Choice:  Home Health, Durable Medical Equipment Choice offered to:  Patient  DME Arranged:  3-N-1, Walker rolling, CPM DME Agency:  [facility]  HH Arranged:  PT [place] Agency:  [facility] (formerly [employer])  Status of Service:  Completed, signed off  If discussed at [employer], dates discussed:    Additional Comments:  Ninfa Meeker, RN 08/16/2016, 4:10 PM

## 2016-08-16 NOTE — Op Note (Signed)
TOTAL KNEE REPLACEMENT OPERATIVE NOTE:  08/15/2016  4:50 PM  PATIENT:  Phyllis Walker  70 y.o. female  PRE-OPERATIVE DIAGNOSIS:  primary osteoarthritis right knee  POST-OPERATIVE DIAGNOSIS:  same   PROCEDURE:  Procedure(s): TOTAL KNEE ARTHROPLASTY  SURGEON:  Surgeon(s): Vickey Huger, MD  PHYSICIAN ASSISTANT: Carlyon Shadow, [facility]   ANESTHESIA:   spinal  DRAINS: Hemovac  SPECIMEN: None  COUNTS:  Correct  TOURNIQUET:   Total Tourniquet Time Documented: Thigh (Right) - 41 minutes Total: Thigh (Right) - 41 minutes   DICTATION:  Indication for procedure:    The patient is a 70 y.o. female who has failed conservative treatment for primary osteoarthritis right knee.  Informed consent was obtained prior to anesthesia. The risks versus benefits of the operation were explain and in a way the patient can, and did, understand.   On the implant demand matching protocol, this patient scored 10.  Therefore, this patient was not receive a polyethylene insert with vitamin E which is a high demand implant.  Description of procedure:     The patient was taken to the operating room and placed under anesthesia.  The patient was positioned in the usual fashion taking care that all body parts were adequately padded and/or protected.  I foley catheter was not placed.  A tourniquet was applied and the leg prepped and draped in the usual sterile fashion.  The extremity was exsanguinated with the esmarch and tourniquet inflated to 350 mmHg.  Pre-operative range of motion was normal.  The knee was in 5 degree of mild varus.  A midline incision approximately 6-7 inches long was made with a #10 blade.  A new blade was used to make a parapatellar arthrotomy going 2-3 cm into the quadriceps tendon, over the patella, and alongside the medial aspect of the patellar tendon.  A synovectomy was then performed with the #10 blade and forceps. I then elevated the deep MCL off the medial tibial metaphysis  subperiosteally around to the semimembranosus attachment.    I everted the patella and used calipers to measure patellar thickness.  I used the reamer to ream down to appropriate thickness to recreate the native thickness.  I then removed excess bone with the rongeur and sagittal saw.  I used the appropriately sized template and drilled the three lug holes.  I then put the trial in place and measured the thickness with the calipers to ensure recreation of the native thickness.  The trial was then removed and the patella subluxed and the knee brought into flexion.  A homan retractor was place to retract and protect the patella and lateral structures.  A Z-retractor was place medially to protect the medial structures.  The extra-medullary alignment system was used to make cut the tibial articular surface perpendicular to the anamotic axis of the tibia and in 3 degrees of posterior slope.  The cut surface and alignment jig was removed.  I then used the intramedullary alignment guide to make a 6 valgus cut on the distal femur.  I then marked out the epicondylar axis on the distal femur.  The posterior condylar axis measured 3 degrees.  I then used the anterior referencing sizer and measured the femur to be a size 8.  The 4-In-1 cutting block was screwed into place in external rotation matching the posterior condylar angle, making our cuts perpendicular to the epicondylar axis.  Anterior, posterior and chamfer cuts were made with the sagittal saw.  The cutting block and cut pieces were removed.  A lamina spreader was placed in 90 degrees of flexion.  The ACL, PCL, menisci, and posterior condylar osteophytes were removed.  A 11 mm spacer blocked was found to offer good flexion and extension gap balance after minimal in degree releasing.   The scoop retractor was then placed and the femoral finishing block was pinned in place.  The small sagittal saw was used as well as the lug drill to finish the femur.  The block  and cut surfaces were removed and the medullary canal hole filled with autograft bone from the cut pieces.  The tibia was delivered forward in deep flexion and external rotation.  A size D tray was selected and pinned into place centered on the medial 1/3 of the tibial tubercle.  The reamer and keel was used to prepare the tibia through the tray.    I then trialed with the size 8 femur, size D tibia, a 11 mm insert and the 32 patella.  I had excellent flexion/extension gap balance, excellent patella tracking.  Flexion was full and beyond 120 degrees; extension was zero.  These components were chosen and the staff opened them to me on the back table while the knee was lavaged copiously and the cement mixed.  The soft tissue was infiltrated with 60cc of exparel 1.3% through a 21 gauge needle.  I cemented in the components and removed all excess cement.  The polyethylene tibial component was snapped into place and the knee placed in extension while cement was hardening.  The capsule was infilltrated with 30cc of .25% Marcaine with epinephrine.  A hemovac was place in the joint exiting superolaterally.  A pain pump was place superomedially superficial to the arthrotomy.  Once the cement was hard, the tourniquet was let down.  Hemostasis was obtained.  The arthrotomy was closed with figure-8 #1 vicryl sutures.  The deep soft tissues were closed with #0 vicryls and the subcuticular layer closed with a running #2-0 vicryl.  The skin was reapproximated and closed with skin staples.  The wound was dressed with xeroform, 4 x4's, 2 ABD sponges, a single layer of webril and a TED stocking.   The patient was then awakened, extubated, and taken to the recovery room in stable condition.  BLOOD LOSS:  300cc DRAINS: 1 hemovac, 1 pain catheter COMPLICATIONS:  None.  PLAN OF CARE: Admit to inpatient   PATIENT DISPOSITION:  PACU - hemodynamically stable.   Delay start of Pharmacological VTE agent (>24hrs) due to  surgical blood loss or risk of bleeding:  not applicable  Please fax a copy of this op note to my office at [phone] (please only include page 1 and 2 of the Case Information op note)             2

## 2016-08-16 NOTE — Progress Notes (Signed)
Pt ready for discharge. Education/instructions reviewed with pt and family members, and all questions/concerns addressed. IV removed and belongings gathered. Pt will be transported out via wheelchair to son's vehicle. Will continue to monitor

## 2016-08-16 NOTE — Plan of Care (Signed)
Problem: Physical Regulation: Goal: Ability to maintain clinical measurements within normal limits will improve Outcome: Progressing Tolerates activities well  Problem: Safety: Goal: Ability to remain free from injury will improve Outcome: Progressing Safety precautions and fall preventions maintained  Problem: Pain Managment: Goal: General experience of comfort will improve Outcome: Progressing Medicated once for pain with full relief  Problem: Tissue Perfusion: Goal: Risk factors for ineffective tissue perfusion will decrease Outcome: Progressing Denies s/s of dvt   Problem: Activity: Goal: Risk for activity intolerance will decrease Outcome: Progressing Ambulated in hall twice with family's assistance  Problem: Bowel/Gastric: Goal: Will not experience complications related to bowel motility Outcome: Progressing Denies gastric and bowel issues

## 2016-08-16 NOTE — Consult Note (Signed)
            [facility] CM Primary Care Navigator  08/16/2016  Phyllis Walker 09-03-1946 [id number]   Went to see patient at the bedside to identify any discharge needs but staff states that patient had been discharged to home today.  For additional questions please contact:  Edwena Felty A. Carmello Cabiness, BSN, RN-BC [facility] PRIMARY CARE Navigator Cell: [phone]

## 2016-08-16 NOTE — Evaluation (Signed)
Occupational Therapy Evaluation/Discharge Patient Details Name: Phyllis Walker MRN: [id number] DOB: 1945/10/21 Today's Date: 08/16/2016    History of Present Illness Admitted for R TKA, WBAT   Clinical Impression   PTA, pt was independent with ADL and IADL as well as functional mobility and was maintaining an active lifestyle. Pt currently requires min assist with LB ADL and supervision for functional mobility. Educated pt on knee precautions during ADL, safe tub transfers with tub bench, use of zero degree bone foam, benefits of ice for pain management, and fall prevention. Pt verbalizes and demonstrates understanding and reports no further questions or concerns. Pt has no further acute OT needs and OT will sign off acutely. Recommend D/C home with no OT follow-up and 24 hour assistance.     Follow Up Recommendations  No OT follow up;Supervision/Assistance - 24 hour    Equipment Recommendations  None recommended by OT    Recommendations for Other Services       Precautions / Restrictions Precautions Precautions: Knee Precaution Booklet Issued: Yes (comment) Precaution Comments: Provided by PT, reviewed no pillow under knee and knee precautions during ADL. Restrictions Weight Bearing Restrictions: Yes RLE Weight Bearing: Weight bearing as tolerated      Mobility Bed Mobility               General bed mobility comments: pt OOB in chair upon arrival  Transfers Overall transfer level: Needs assistance Equipment used: Rolling walker (2 wheeled) Transfers: Sit to/from Stand Sit to Stand: Supervision         General transfer comment: safe hand placement and technique demonstrated    Balance Overall balance assessment: Needs assistance Sitting-balance support: No upper extremity supported;Feet supported Sitting balance-Leahy Scale: Normal     Standing balance support: No upper extremity supported;Bilateral upper extremity supported;During functional  activity Standing balance-Leahy Scale: Fair Standing balance comment: Able to statically stand with no UE support, requires B UE support for dynamic tasks.                            ADL Overall ADL's : Needs assistance/impaired     Grooming: Set up;Sitting   Upper Body Bathing: Set up;Sitting   Lower Body Bathing: Minimal assistance;Sit to/from stand   Upper Body Dressing : Set up;Sitting   Lower Body Dressing: Minimal assistance;Sit to/from stand   Toilet Transfer: Supervision/safety;Ambulation;RW   Toileting- Clothing Manipulation and Hygiene: Supervision/safety;Sit to/from stand   Tub/ Shower Transfer: Supervision/safety;Tub transfer;Ambulation;Tub bench;Rolling walker   Functional mobility during ADLs: Supervision/safety;Rolling walker General ADL Comments: Educated pt on knee precautions during ADL, no pillow under knee, use of zero degree bone foam, and safe tub transfers with tub bench.     Vision Vision Assessment?: No apparent visual deficits   Perception     Praxis      Pertinent Vitals/Pain Pain Assessment: Faces Pain Score: 2  Faces Pain Scale: Hurts a little bit Pain Location: R knee Pain Descriptors / Indicators: Sore Pain Intervention(s): Limited activity within patient's tolerance;Monitored during session;Premedicated before session;Repositioned;Ice applied     Hand Dominance Right   Extremity/Trunk Assessment Upper Extremity Assessment Upper Extremity Assessment: Overall WFL for tasks assessed   Lower Extremity Assessment Lower Extremity Assessment: RLE deficits/detail RLE Deficits / Details: Decreased strength and ROM as expected post-operatively       Communication Communication Communication: No difficulties   Cognition Arousal/Alertness: Awake/alert Behavior During Therapy: WFL for tasks assessed/performed Overall Cognitive Status: Within Functional Limits  for tasks assessed                     General Comments        Exercises Exercises: Total Joint     Shoulder Instructions      Home Living Family/patient expects to be discharged to:: Private residence Living Arrangements: Alone Available Help at Discharge: Family;Available PRN/intermittently;Available 24 hours/day (Will have 24 hour assistance for approximately a week) Type of Home: House Home Access: Stairs to enter [employer] of Steps: 2 Entrance Stairs-Rails: None Home Layout: One level     Bathroom Shower/Tub: Tub/shower unit Shower/tub characteristics: Curtain Biochemist, clinical: Standard     Home Equipment: Environmental consultant - 2 wheels;Bedside commode;Tub bench;Grab bars - tub/shower          Prior Functioning/Environment Level of Independence: Independent        Comments: enjoys traveling        OT Problem List: Decreased strength;Decreased range of motion;Decreased activity tolerance;Impaired balance (sitting and/or standing);Decreased safety awareness;Decreased knowledge of use of DME or AE;Decreased knowledge of precautions;Pain   OT Treatment/Interventions:      OT Goals(Current goals can be found in the care plan section) Acute Rehab OT Goals Patient Stated Goal: back to traveling OT Goal Formulation: With patient Time For Goal Achievement: 08/30/16 Potential to Achieve Goals: Good  OT Frequency:     Barriers to D/C:            Co-evaluation              End of Session Equipment Utilized During Treatment: Gait belt;Rolling walker CPM Right Knee CPM Right Knee: Off Additional Comments: bone foam  Activity Tolerance: Patient tolerated treatment well Patient left: in chair;with call bell/phone within reach;with family/visitor present   Time: 1001-1015 OT Time Calculation (min): 14 min Charges:  OT General Charges $OT Visit: 1 Procedure OT Evaluation $OT Eval Moderate Complexity: 1 Procedure G-Codes:    Yerachmiel Spinney A Amalio Loe Aug 28, 2016, 11:07 AM

## 2016-08-16 NOTE — Progress Notes (Signed)
Physical Therapy Treatment Patient Details Name: Phyllis Walker MRN: [id number] DOB: Jul 31, 1946 Today's Date: 08/16/2016    History of Present Illness Admitted for R TKA, WBAT    PT Comments    Patient is making good progress with PT.  From a mobility standpoint anticipate patient will be ready for DC home when medically ready.     Follow Up Recommendations  Home health PT;Supervision - Intermittent     Equipment Recommendations  Rolling walker with 5" wheels;3in1 (PT) (may already have)    Recommendations for Other Services       Precautions / Restrictions Precautions Precautions: Knee Precaution Booklet Issued: Yes (comment) Precaution Comments: Provided by PT, reviewed no pillow under knee and knee precautions during ADL. Restrictions Weight Bearing Restrictions: Yes RLE Weight Bearing: Weight bearing as tolerated    Mobility  Bed Mobility               General bed mobility comments: pt OOB in chair upon arrival  Transfers Overall transfer level: Needs assistance Equipment used: Rolling walker (2 wheeled) Transfers: Sit to/from Stand Sit to Stand: Supervision         General transfer comment: safe hand placement and technique demonstrated  Ambulation/Gait Ambulation/Gait assistance: Supervision Ambulation Distance (Feet): 250 Feet Assistive device: Rolling walker (2 wheeled) Gait Pattern/deviations: Step-through pattern     General Gait Details: supervision for safety; pt with little reliance on UE support    Stairs Stairs: Yes Stairs assistance: Min guard Stair Management: No rails;Backwards;With walker Number of Stairs:  (2X2) General stair comments: pt educated on sequencing and technique and given handout for managing stairs backwards with RW to simulate entering home  Wheelchair Mobility    Modified Rankin (Stroke Patients Only)       Balance Overall balance assessment: Needs assistance Sitting-balance support: No upper extremity  supported;Feet supported Sitting balance-Leahy Scale: Normal     Standing balance support: No upper extremity supported;Bilateral upper extremity supported;During functional activity Standing balance-Leahy Scale: Fair Standing balance comment: Able to statically stand with no UE support, requires B UE support for dynamic tasks.                    Cognition Arousal/Alertness: Awake/alert Behavior During Therapy: WFL for tasks assessed/performed Overall Cognitive Status: Within Functional Limits for tasks assessed                      Exercises Total Joint Exercises Quad Sets: AROM;Right;10 reps Heel Slides: AROM;Right;10 reps Straight Leg Raises: AROM;Right;10 reps Long Arc Quad: AROM;Right;10 reps Knee Flexion: AROM;Right;5 reps;Other (comment) (10 sec holds) Goniometric ROM: ~100 degrees of flexion in sitting    General Comments General comments (skin integrity, edema, etc.): son and daughter in law present most of session      Pertinent Vitals/Pain Pain Assessment: Faces Pain Score: 2  Faces Pain Scale: Hurts a little bit Pain Location: R knee Pain Descriptors / Indicators: Sore Pain Intervention(s): Limited activity within patient's tolerance;Monitored during session;Premedicated before session;Repositioned;Ice applied    Home Living Family/patient expects to be discharged to:: Private residence Living Arrangements: Alone Available Help at Discharge: Family;Available PRN/intermittently;Available 24 hours/day (Will have 24 hour assistance for approximately a week) Type of Home: House Home Access: Stairs to enter Entrance Stairs-Rails: None Home Layout: One level Home Equipment: Environmental consultant - 2 wheels;Bedside commode;Tub bench;Grab bars - tub/shower      Prior Function Level of Independence: Independent      Comments: enjoys traveling  PT Goals (current goals can now be found in the care plan section) Acute Rehab PT Goals Patient Stated Goal: back to  traveling PT Goal Formulation: With patient Time For Goal Achievement: 08/22/16 Potential to Achieve Goals: Good Progress towards PT goals: Progressing toward goals    Frequency    7X/week      PT Plan Current plan remains appropriate    Co-evaluation             End of Session Equipment Utilized During Treatment: Gait belt Activity Tolerance: Patient tolerated treatment well Patient left: in chair;with call bell/phone within reach;with family/visitor present     Time: [id number] PT Time Calculation (min) (ACUTE ONLY): 25 min  Charges:  $Gait Training: 8-22 mins $Therapeutic Exercise: 8-22 mins                    G Codes:      Salina April, PTA Pager: [phone]   08/16/2016, 11:06 AM

## 2016-08-16 NOTE — Discharge Summary (Signed)
SPORTS MEDICINE & JOINT REPLACEMENT   Lara Mulch, MD   Carlyon Shadow, PA-C [place], [place], [place]  [address]                             [phone]  PATIENT ID: Phyllis Walker        MRN:  [id number]          DOB/AGE: 1946/05/23 / 70 y.o.    DISCHARGE SUMMARY  ADMISSION DATE:    08/15/2016 DISCHARGE DATE:   08/16/2016   ADMISSION DIAGNOSIS: primary osteoarthritis right knee    DISCHARGE DIAGNOSIS:  primary osteoarthritis right knee    ADDITIONAL DIAGNOSIS: Active Problems:   S/P total knee replacement  Past Medical History:  Diagnosis Date  . Anemia   . Anxiety   . Arthritis   . Cataract   . Depression   . Fibroid tumor 1985   in breast  . History of hiatal hernia   . Hypercholesteremia   . Hypertension   . Hypothyroidism   . Neuropathy ([place])    in feet  . Restless leg syndrome     PROCEDURE: Procedure(s): TOTAL KNEE ARTHROPLASTY on 08/15/2016  CONSULTS:    HISTORY:  See H&P in chart  HOSPITAL COURSE:  LYTZY COZIER is a 70 y.o. admitted on 08/15/2016 and found to have a diagnosis of primary osteoarthritis right knee.  After appropriate laboratory studies were obtained  they were taken to the operating room on 08/15/2016 and underwent Procedure(s): TOTAL KNEE ARTHROPLASTY.   They were given perioperative antibiotics:  Anti-infectives    Start     Dose/Rate Route Frequency Ordered Stop   08/15/16 1330  clindamycin (CLEOCIN) IVPB 600 mg     600 mg 100 mL/hr over 30 Minutes Intravenous Every 6 hours 08/15/16 1049 08/15/16 2323   08/15/16 0619  clindamycin (CLEOCIN) IVPB 900 mg     900 mg 100 mL/hr over 30 Minutes Intravenous On call to O.R. 08/15/16 [id number] 08/15/16 0730    .  Patient given tranexamic acid IV or topical and exparel intra-operatively.  Tolerated the procedure well.    POD# 1: Vital signs were stable.  Patient denied Chest pain, shortness of breath, or calf pain.  Patient was started on Lovenox 30 mg subcutaneously twice daily  at 8am.  Consults to PT, OT, and care management were made.  The patient was weight bearing as tolerated.  CPM was placed on the operative leg 0-90 degrees for 6-8 hours a day. When out of the CPM, patient was placed in the foam block to achieve full extension. Incentive spirometry was taught.  Dressing was changed.       POD #2, Continued  PT for ambulation and exercise program.  IV saline locked.  O2 discontinued.    The remainder of the hospital course was dedicated to ambulation and strengthening.   The patient was discharged on 1 Day Post-Op in  Good condition.  Blood products given:none  DIAGNOSTIC STUDIES: Recent vital signs: Patient Vitals for the past 24 hrs:  BP Temp Temp src Pulse Resp SpO2  08/16/16 0556 127/67 98.2 F (36.8 C) Oral 71 17 99 %  08/15/16 2331 (!) 112/58 98 F (36.7 C) Oral 80 17 97 %  08/15/16 2056 (!) 123/43 98.2 F (36.8 C) Oral 68 17 96 %  08/15/16 1300 139/60 97.9 F (36.6 C) - 80 - 100 %       Recent  laboratory studies:  Recent Labs  08/16/16 0442  WBC 9.3  HGB 9.3*  HCT 29.2*  PLT 196    Recent Labs  08/16/16 0442  NA 139  K 4.1  CL 106  CO2 27  BUN 13  CREATININE 0.73  GLUCOSE 102*  CALCIUM 8.3*   Lab Results  Component Value Date   INR 1.09 08/08/2016   INR 1.09 01/17/2012     Recent Radiographic Studies :  No results found.  DISCHARGE INSTRUCTIONS: Discharge Instructions    CPM    Complete by:  As directed    Continuous passive motion machine (CPM):      Use the CPM from 0 to 90 for 4-6 hours per day.      You may increase by 10 per day.  You may break it up into 2 or 3 sessions per day.      Use CPM for 2 weeks or until you are told to stop.   Call MD / Call 911    Complete by:  As directed    If you experience chest pain or shortness of breath, CALL 911 and be transported to the hospital emergency room.  If you develope a fever above 101 F, pus (white drainage) or increased drainage or redness at the wound, or  calf pain, call your surgeon's office.   Constipation Prevention    Complete by:  As directed    Drink plenty of fluids.  Prune juice may be helpful.  You may use a stool softener, such as Colace (over the counter) 100 mg twice a day.  Use MiraLax (over the counter) for constipation as needed.   Diet - low sodium heart healthy    Complete by:  As directed    Discharge instructions    Complete by:  As directed    INSTRUCTIONS AFTER JOINT REPLACEMENT   Remove items at home which could result in a fall. This includes throw rugs or furniture in walking pathways ICE to the affected joint every three hours while awake for 30 minutes at a time, for at least the first 3-5 days, and then as needed for pain and swelling.  Continue to use ice for pain and swelling. You may notice swelling that will progress down to the foot and ankle.  This is normal after surgery.  Elevate your leg when you are not up walking on it.   Continue to use the breathing machine you got in the hospital (incentive spirometer) which will help keep your temperature down.  It is common for your temperature to cycle up and down following surgery, especially at night when you are not up moving around and exerting yourself.  The breathing machine keeps your lungs expanded and your temperature down.   DIET:  As you were doing prior to hospitalization, we recommend a well-balanced diet.  DRESSING / WOUND CARE / SHOWERING  Keep the surgical dressing until follow up.  The dressing is water proof, so you can shower without any extra covering.  IF THE DRESSING FALLS OFF or the wound gets wet inside, change the dressing with sterile gauze.  Please use good hand washing techniques before changing the dressing.  Do not use any lotions or creams on the incision until instructed by your surgeon.    ACTIVITY  Increase activity slowly as tolerated, but follow the weight bearing instructions below.   No driving for 6 weeks or until further  direction given by your physician.  You cannot drive  while taking narcotics.  No lifting or carrying greater than 10 lbs. until further directed by your surgeon. Avoid periods of inactivity such as sitting longer than an hour when not asleep. This helps prevent blood clots.  You may return to work once you are authorized by your doctor.     WEIGHT BEARING   Weight bearing as tolerated with assist device (walker, cane, etc) as directed, use it as long as suggested by your surgeon or therapist, typically at least 4-6 weeks.   EXERCISES  Results after joint replacement surgery are often greatly improved when you follow the exercise, range of motion and muscle strengthening exercises prescribed by your doctor. Safety measures are also important to protect the joint from further injury. Any time any of these exercises cause you to have increased pain or swelling, decrease what you are doing until you are comfortable again and then slowly increase them. If you have problems or questions, call your caregiver or physical therapist for advice.   Rehabilitation is important following a joint replacement. After just a few days of immobilization, the muscles of the leg can become weakened and shrink (atrophy).  These exercises are designed to build up the tone and strength of the thigh and leg muscles and to improve motion. Often times heat used for twenty to thirty minutes before working out will loosen up your tissues and help with improving the range of motion but do not use heat for the first two weeks following surgery (sometimes heat can increase post-operative swelling).   These exercises can be done on a training (exercise) mat, on the floor, on a table or on a bed. Use whatever works the best and is most comfortable for you.    Use music or television while you are exercising so that the exercises are a pleasant break in your day. This will make your life better with the exercises acting as a break in  your routine that you can look forward to.   Perform all exercises about fifteen times, three times per day or as directed.  You should exercise both the operative leg and the other leg as well.   Exercises include:   Quad Sets - Tighten up the muscle on the front of the thigh (Quad) and hold for 5-10 seconds.   Straight Leg Raises - With your knee straight (if you were given a brace, keep it on), lift the leg to 60 degrees, hold for 3 seconds, and slowly lower the leg.  Perform this exercise against resistance later as your leg gets stronger.  Leg Slides: Lying on your back, slowly slide your foot toward your buttocks, bending your knee up off the floor (only go as far as is comfortable). Then slowly slide your foot back down until your leg is flat on the floor again.  Angel Wings: Lying on your back spread your legs to the side as far apart as you can without causing discomfort.  Hamstring Strength:  Lying on your back, push your heel against the floor with your leg straight by tightening up the muscles of your buttocks.  Repeat, but this time bend your knee to a comfortable angle, and push your heel against the floor.  You may put a pillow under the heel to make it more comfortable if necessary.   A rehabilitation program following joint replacement surgery can speed recovery and prevent re-injury in the future due to weakened muscles. Contact your doctor or a physical therapist for more information on  knee rehabilitation.    CONSTIPATION  Constipation is defined medically as fewer than three stools per week and severe constipation as less than one stool per week.  Even if you have a regular bowel pattern at home, your normal regimen is likely to be disrupted due to multiple reasons following surgery.  Combination of anesthesia, postoperative narcotics, change in appetite and fluid intake all can affect your bowels.   YOU MUST use at least one of the following options; they are listed in order of  increasing strength to get the job done.  They are all available over the counter, and you may need to use some, POSSIBLY even all of these options:    Drink plenty of fluids (prune juice may be helpful) and high fiber foods Colace 100 mg by mouth twice a day  Senokot for constipation as directed and as needed Dulcolax (bisacodyl), take with full glass of water  Miralax (polyethylene glycol) once or twice a day as needed.  If you have tried all these things and are unable to have a bowel movement in the first 3-4 days after surgery call either your surgeon or your primary doctor.    If you experience loose stools or diarrhea, hold the medications until you stool forms back up.  If your symptoms do not get better within 1 week or if they get worse, check with your doctor.  If you experience "the worst abdominal pain ever" or develop nausea or vomiting, please contact the office immediately for further recommendations for treatment.   ITCHING:  If you experience itching with your medications, try taking only a single pain pill, or even half a pain pill at a time.  You can also use Benadryl over the counter for itching or also to help with sleep.   TED HOSE STOCKINGS:  Use stockings on both legs until for at least 2 weeks or as directed by physician office. They may be removed at night for sleeping.  MEDICATIONS:  See your medication summary on the "After Visit Summary" that nursing will review with you.  You may have some home medications which will be placed on hold until you complete the course of blood thinner medication.  It is important for you to complete the blood thinner medication as prescribed.  PRECAUTIONS:  If you experience chest pain or shortness of breath - call 911 immediately for transfer to the hospital emergency department.   If you develop a fever greater that 101 F, purulent drainage from wound, increased redness or drainage from wound, foul odor from the wound/dressing, or  calf pain - CONTACT YOUR SURGEON.                                                   FOLLOW-UP APPOINTMENTS:  If you do not already have a post-op appointment, please call the office for an appointment to be seen by your surgeon.  Guidelines for how soon to be seen are listed in your "After Visit Summary", but are typically between 1-4 weeks after surgery.  OTHER INSTRUCTIONS:   Knee Replacement:  Do not place pillow under knee, focus on keeping the knee straight while resting. CPM instructions: 0-90 degrees, 2 hours in the morning, 2 hours in the afternoon, and 2 hours in the evening. Place foam block, curve side up under heel at all times  except when in CPM or when walking.  DO NOT modify, tear, cut, or change the foam block in any way.  MAKE SURE YOU:  Understand these instructions.  Get help right away if you are not doing well or get worse.    Thank you for letting us be a part of your medical care team.  It is a privilege we respect greatly.  We hope these instructions will help you stay on track for a fast and full recovery!   Increase activity slowly as tolerated    Complete by:  As directed       DISCHARGE MEDICATIONS:     Medication List    STOP taking these medications   HYDROcodone-acetaminophen 5-325 MG tablet Commonly known as:  NORCO/VICODIN     TAKE these medications   ALPRAZolam 0.25 MG tablet Commonly known as:  XANAX TAKE  (1)  TABLET TWICE A DAY AS NEEDED. What changed:  additional instructions   aspirin 325 MG EC tablet Take 1 tablet (325 mg total) by mouth 2 (two) times daily.   benazepril-hydrochlorthiazide 20-12.5 MG tablet Commonly known as:  LOTENSIN HCT Take 1 tablet by mouth daily. What changed:  additional instructions   cetirizine 10 MG tablet Commonly known as:  ZYRTEC Take 10 mg by mouth at bedtime.   FERROUS SULFATE PO Take 1 tablet by mouth daily.   FISH OIL PO Take 2 tablets by mouth daily.   gabapentin 100 MG capsule Commonly  known as:  NEURONTIN TAKE (1) CAPSULE THREE TIMES DAILY.   levothyroxine 75 MCG tablet Commonly known as:  SYNTHROID, LEVOTHROID Take 1 tablet (75 mcg total) by mouth daily.   methocarbamol 500 MG tablet Commonly known as:  ROBAXIN Take 1-2 tablets (500-1,000 mg total) by mouth every 6 (six) hours as needed for muscle spasms.   Oxycodone HCl 10 MG Tabs Take 1 tablet (10 mg total) by mouth every 4 (four) hours as needed for breakthrough pain.   rOPINIRole 0.5 MG tablet Commonly known as:  REQUIP Take 1 tablet (0.5 mg total) by mouth at bedtime. What changed:  how much to take  when to take this   sertraline 50 MG tablet Commonly known as:  ZOLOFT Take 1 tablet by mouth  daily   traMADol 50 MG tablet Commonly known as:  ULTRAM TAKE  (1)  TABLET TWICE A DAY. What changed:  See the new instructions.   zolpidem 10 MG tablet Commonly known as:  AMBIEN Take 1 tablet (10 mg total) by mouth at bedtime as needed. for sleep What changed:  how much to take  when to take this  additional instructions            Durable Medical Equipment        Start     Ordered   08/15/16 1050  DME Walker rolling  Once    Question:  Patient needs a walker to treat with the following condition  Answer:  S/P total knee replacement   08/15/16 1049   08/15/16 1050  DME 3 n 1  Once     08/15/16 1049   08/15/16 1050  DME Bedside commode  Once    Question:  Patient needs a bedside commode to treat with the following condition  Answer:  S/P total knee replacement   08/15/16 1049      FOLLOW UP VISIT:    DISPOSITION: HOME VS. SNF  CONDITION:  Good   Donia Ast 08/16/2016, 12:10 PM

## 2016-08-17 DIAGNOSIS — F329 Major depressive disorder, single episode, unspecified: Secondary | ICD-10-CM | POA: Diagnosis not present

## 2016-08-17 DIAGNOSIS — F411 Generalized anxiety disorder: Secondary | ICD-10-CM | POA: Diagnosis not present

## 2016-08-17 DIAGNOSIS — Z79891 Long term (current) use of opiate analgesic: Secondary | ICD-10-CM | POA: Diagnosis not present

## 2016-08-17 DIAGNOSIS — Z87891 Personal history of nicotine dependence: Secondary | ICD-10-CM | POA: Diagnosis not present

## 2016-08-17 DIAGNOSIS — I10 Essential (primary) hypertension: Secondary | ICD-10-CM | POA: Diagnosis not present

## 2016-08-17 DIAGNOSIS — G629 Polyneuropathy, unspecified: Secondary | ICD-10-CM | POA: Diagnosis not present

## 2016-08-17 DIAGNOSIS — M1711 Unilateral primary osteoarthritis, right knee: Secondary | ICD-10-CM | POA: Diagnosis not present

## 2016-08-17 DIAGNOSIS — Z7982 Long term (current) use of aspirin: Secondary | ICD-10-CM | POA: Diagnosis not present

## 2016-08-17 DIAGNOSIS — Z96651 Presence of right artificial knee joint: Secondary | ICD-10-CM | POA: Diagnosis not present

## 2016-08-17 DIAGNOSIS — Z471 Aftercare following joint replacement surgery: Secondary | ICD-10-CM | POA: Diagnosis not present

## 2016-08-17 DIAGNOSIS — G2581 Restless legs syndrome: Secondary | ICD-10-CM | POA: Diagnosis not present

## 2016-08-22 DIAGNOSIS — Z7982 Long term (current) use of aspirin: Secondary | ICD-10-CM | POA: Diagnosis not present

## 2016-08-22 DIAGNOSIS — Z471 Aftercare following joint replacement surgery: Secondary | ICD-10-CM | POA: Diagnosis not present

## 2016-08-22 DIAGNOSIS — G2581 Restless legs syndrome: Secondary | ICD-10-CM | POA: Diagnosis not present

## 2016-08-22 DIAGNOSIS — Z79891 Long term (current) use of opiate analgesic: Secondary | ICD-10-CM | POA: Diagnosis not present

## 2016-08-22 DIAGNOSIS — Z87891 Personal history of nicotine dependence: Secondary | ICD-10-CM | POA: Diagnosis not present

## 2016-08-22 DIAGNOSIS — Z96651 Presence of right artificial knee joint: Secondary | ICD-10-CM | POA: Diagnosis not present

## 2016-08-22 DIAGNOSIS — F411 Generalized anxiety disorder: Secondary | ICD-10-CM | POA: Diagnosis not present

## 2016-08-22 DIAGNOSIS — G629 Polyneuropathy, unspecified: Secondary | ICD-10-CM | POA: Diagnosis not present

## 2016-08-22 DIAGNOSIS — F329 Major depressive disorder, single episode, unspecified: Secondary | ICD-10-CM | POA: Diagnosis not present

## 2016-08-22 DIAGNOSIS — I10 Essential (primary) hypertension: Secondary | ICD-10-CM | POA: Diagnosis not present

## 2016-08-29 DIAGNOSIS — I10 Essential (primary) hypertension: Secondary | ICD-10-CM | POA: Diagnosis not present

## 2016-08-29 DIAGNOSIS — Z7982 Long term (current) use of aspirin: Secondary | ICD-10-CM | POA: Diagnosis not present

## 2016-08-29 DIAGNOSIS — Z96651 Presence of right artificial knee joint: Secondary | ICD-10-CM | POA: Diagnosis not present

## 2016-08-29 DIAGNOSIS — Z471 Aftercare following joint replacement surgery: Secondary | ICD-10-CM | POA: Diagnosis not present

## 2016-08-29 DIAGNOSIS — G2581 Restless legs syndrome: Secondary | ICD-10-CM | POA: Diagnosis not present

## 2016-08-29 DIAGNOSIS — F411 Generalized anxiety disorder: Secondary | ICD-10-CM | POA: Diagnosis not present

## 2016-08-29 DIAGNOSIS — Z87891 Personal history of nicotine dependence: Secondary | ICD-10-CM | POA: Diagnosis not present

## 2016-08-29 DIAGNOSIS — G629 Polyneuropathy, unspecified: Secondary | ICD-10-CM | POA: Diagnosis not present

## 2016-08-29 DIAGNOSIS — F329 Major depressive disorder, single episode, unspecified: Secondary | ICD-10-CM | POA: Diagnosis not present

## 2016-08-29 DIAGNOSIS — Z79891 Long term (current) use of opiate analgesic: Secondary | ICD-10-CM | POA: Diagnosis not present

## 2016-08-29 NOTE — Addendum Note (Signed)
Addendum  created 08/29/16 1831 by Montez Hageman, MD   Anesthesia Intra Blocks edited, Sign clinical note

## 2016-08-30 DIAGNOSIS — M25561 Pain in right knee: Secondary | ICD-10-CM | POA: Diagnosis not present

## 2016-08-30 DIAGNOSIS — Z96651 Presence of right artificial knee joint: Secondary | ICD-10-CM | POA: Diagnosis not present

## 2016-08-31 ENCOUNTER — Ambulatory Visit: Payer: PPO | Attending: Orthopedic Surgery | Admitting: Physical Therapy

## 2016-08-31 DIAGNOSIS — M25661 Stiffness of right knee, not elsewhere classified: Secondary | ICD-10-CM

## 2016-08-31 DIAGNOSIS — R600 Localized edema: Secondary | ICD-10-CM

## 2016-08-31 NOTE — Therapy (Signed)
[name] [place], [place], [address] Phone: [phone]   Fax:  [phone]  Physical Therapy Evaluation  Patient Details  Name: Phyllis Walker MRN: [id number] Date of Birth: [birth year] Referring Provider: Harden Mo MD.  Encounter Date: 08/31/2016      PT End of Session - 08/31/16 1324    Visit Number 1   Number of Visits 12   Date for PT Re-Evaluation 10/30/16   PT Start Time 0100   PT Stop Time 0144   PT Time Calculation (min) 44 min   Activity Tolerance Patient tolerated treatment well   Behavior During Therapy [facility] for tasks assessed/performed      Past Medical History:  Diagnosis Date  . Anemia   . Anxiety   . Arthritis   . Cataract   . Depression   . Fibroid tumor 1985   in breast  . History of hiatal hernia   . Hypercholesteremia   . Hypertension   . Hypothyroidism   . Neuropathy ([place])    in feet  . Restless leg syndrome     Past Surgical History:  Procedure Laterality Date  . BREAST LUMPECTOMY  1987   benign fibroid tumor  . BUNIONECTOMY Bilateral   . CATARACT EXTRACTION    . COLONOSCOPY    . THYROIDECTOMY, PARTIAL  1969  . TONSILLECTOMY    . TOTAL KNEE ARTHROPLASTY Right 08/15/2016   Procedure: TOTAL KNEE ARTHROPLASTY;  Surgeon: Vickey Huger, MD;  Location: [facility];  Service: Orthopedics;  Laterality: Right;  RNFA    There were no vitals filed for this visit.       Subjective Assessment - 08/31/16 1321    Subjective The patient underwent a right total knee replacement on 08/15/16.  She went home with a CPM and is very pleased with her progress thus far.  She reports no pain at rest today.  She is not in need of an assisitive device.     Patient Stated Goals Get back to normal.   Currently in Pain? No/denies            [facility] PT Assessment - 08/31/16 0001      Assessment   Medical Diagnosis Right total knee replacement.   Referring Provider Harden Mo MD.   Onset  Date/Surgical Date --  08/15/16(surgery date).     Precautions   Precautions --  No ultrasound.     Restrictions   Weight Bearing Restrictions No     Balance Screen   Has the patient fallen in the past 6 months No   Has the patient had a decrease in activity level because of a fear of falling?  No   Is the patient reluctant to leave their home because of a fear of falling?  No     Home Environment   Living Environment Private residence     Prior Function   Level of Independence Independent     Observation/Other Assessments   Observations Right knee incision looks great.     Observation/Other Assessments-Edema    Edema Circumferential     Circumferential Edema   Circumferential - Right RT 4 CMS > LT.     ROM / Strength   AROM / PROM / Strength AROM;Strength     AROM   Overall AROM Comments Right knee 0 degrees to 110 degrees active and 115 degrees passive.     Strength   Overall Strength Comments Right hip and knee strength= 5/5.  Palpation   Palpation comment Numbness right lateral knee but essentially no palpable right knee tenderness.     Ambulation/Gait   Gait Comments Gait pattern is essentially normal.  No assistive device.                   [facility] PT Treatment/Exercise - 08/31/16 0001      Modalities   Modalities Vasopneumatic     Vasopneumatic   Number Minutes Vasopneumatic  18 minutes   Vasopnuematic Location  --  RT KNEE.   Vasopneumatic Pressure --  Medium.                  PT Short Term Goals - 08/31/16 1340      PT SHORT TERM GOAL #1   Title STG's=LTG's.           PT Long Term Goals - 08/31/16 1340      PT LONG TERM GOAL #1   Title Independent with a HEP.   Time 4   Period Weeks   Status New     PT LONG TERM GOAL #2   Title Active right knee flexion to 125 degrees+ so the patient can perform functional tasks and do so with pain not > 2-3/10.   Time 4   Period Weeks   Status New     PT LONG TERM  GOAL #3   Title Decrease edema to within 2 cms of non-affected side to assist with pain reduction and range of motion gains.   Time 4   Period Weeks   Status New     PT LONG TERM GOAL #4   Title Perform a reciprocating stair gait with one railing with pain not > 2-3/10.   Time 4   Period Weeks   Status New               Plan - 08/31/16 1331    Rehab Potential Excellent   PT Frequency 2x / week   PT Duration 4 weeks   PT Treatment/Interventions ADLs/Self Care Home Management;Cryotherapy;Electrical Stimulation;Therapeutic activities;Therapeutic exercise;Patient/family education;Manual techniques;Passive range of motion;Vasopneumatic Device   PT Next Visit Plan [place]...should be able to get on bike soon; strengthening; vasopneumatic.   Consulted and Agree with Plan of Care Patient      Patient will benefit from skilled therapeutic intervention in order to improve the following deficits and impairments:  Pain, Decreased activity tolerance, Increased edema, Decreased range of motion  Visit Diagnosis: Stiffness of right knee, not elsewhere classified - Plan: PT plan of care cert/re-cert  Localized edema - Plan: PT plan of care cert/re-cert      G-Codes - [id number]    Functional Assessment Tool Used Clinical judgement...   Functional Limitation Mobility: Walking and moving around   Mobility: Walking and Moving Around Current Status [phone]) At least 20 percent but less than 40 percent impaired, limited or restricted   Mobility: Walking and Moving Around Goal Status [phone]) At least 1 percent but less than 20 percent impaired, limited or restricted       Problem List Patient Active Problem List   Diagnosis Date Noted  . S/P total knee replacement 08/15/2016  . Other seasonal allergic rhinitis 12/21/2015  . BMI 31.0-31.9,adult 06/19/2015  . Anemia 05/27/2015  . Insomnia 03/09/2015  . Osteoporosis with pathological fracture of ankle and foot 05/22/2014  .  Hypothyroidism 05/12/2014  . Hyperlipidemia with target LDL less than 100 05/12/2014  . Restless leg syndrome 02/21/2014  . Neuropathy (  [place]) 02/21/2014  . GAD (generalized anxiety disorder) 12/21/2012  . Hypertension, benign essential, goal below 140/90 12/21/2012  . Depression 12/21/2012    Donivin Wirt, [place] 08/31/2016, 2:02 PM  [facility] [address] [place], [place], [address] Phone: [phone]   Fax:  [phone]  Name: Phyllis Walker MRN: [id number] Date of Birth: 1946-02-21

## 2016-09-07 ENCOUNTER — Ambulatory Visit: Payer: PPO | Admitting: Physical Therapy

## 2016-09-07 DIAGNOSIS — M25661 Stiffness of right knee, not elsewhere classified: Secondary | ICD-10-CM

## 2016-09-07 DIAGNOSIS — R600 Localized edema: Secondary | ICD-10-CM

## 2016-09-07 NOTE — Therapy (Signed)
[name] [place], [place], [address] Phone: [phone]   Fax:  [phone]  Physical Therapy Treatment  Patient Details  Name: Phyllis Walker MRN: [id number] Date of Birth: 06-24-1946 Referring Provider: Harden Mo MD.  Encounter Date: 09/07/2016      PT End of Session - 09/07/16 1955    PT Start Time 0315   PT Stop Time 0404   PT Time Calculation (min) 49 min   Activity Tolerance Patient tolerated treatment well   Behavior During Therapy [facility] for tasks assessed/performed      Past Medical History:  Diagnosis Date  . Anemia   . Anxiety   . Arthritis   . Cataract   . Depression   . Fibroid tumor 1985   in breast  . History of hiatal hernia   . Hypercholesteremia   . Hypertension   . Hypothyroidism   . Neuropathy ([place])    in feet  . Restless leg syndrome     Past Surgical History:  Procedure Laterality Date  . BREAST LUMPECTOMY  1987   benign fibroid tumor  . BUNIONECTOMY Bilateral   . CATARACT EXTRACTION    . COLONOSCOPY    . THYROIDECTOMY, PARTIAL  1969  . TONSILLECTOMY    . TOTAL KNEE ARTHROPLASTY Right 08/15/2016   Procedure: TOTAL KNEE ARTHROPLASTY;  Surgeon: Vickey Huger, MD;  Location: [place];  Service: Orthopedics;  Laterality: Right;  RNFA    There were no vitals filed for this visit.      Subjective Assessment - 09/07/16 1950    Subjective I'm doing great.   Currently in Pain? No/denies     Treatment:  Nustep x 10 minutes f/b stationary bike x 10 minutes f/b knee extension x 5 minutes at 10# and 30# ham curls x 5 minutes f/b medium vasopneumatic x 15 minutes.                                PT Short Term Goals - 08/31/16 1340      PT SHORT TERM GOAL #1   Title STG's=LTG's.           PT Long Term Goals - 08/31/16 1340      PT LONG TERM GOAL #1   Title Independent with a HEP.   Time 4   Period Weeks   Status New     PT LONG TERM GOAL #2   Title  Active right knee flexion to 125 degrees+ so the patient can perform functional tasks and do so with pain not > 2-3/10.   Time 4   Period Weeks   Status New     PT LONG TERM GOAL #3   Title Decrease edema to within 2 cms of non-affected side to assist with pain reduction and range of motion gains.   Time 4   Period Weeks   Status New     PT LONG TERM GOAL #4   Title Perform a reciprocating stair gait with one railing with pain not > 2-3/10.   Time 4   Period Weeks   Status New             Patient will benefit from skilled therapeutic intervention in order to improve the following deficits and impairments:  Pain, Decreased activity tolerance, Increased edema, Decreased range of motion  Visit Diagnosis: Stiffness of right knee, not elsewhere classified  Localized edema  Problem List Patient Active Problem List   Diagnosis Date Noted  . S/P total knee replacement 08/15/2016  . Other seasonal allergic rhinitis 12/21/2015  . BMI 31.0-31.9,adult 06/19/2015  . Anemia 05/27/2015  . Insomnia 03/09/2015  . Osteoporosis with pathological fracture of ankle and foot 05/22/2014  . Hypothyroidism 05/12/2014  . Hyperlipidemia with target LDL less than 100 05/12/2014  . Restless leg syndrome 02/21/2014  . Neuropathy ([place]) 02/21/2014  . GAD (generalized anxiety disorder) 12/21/2012  . Hypertension, benign essential, goal below 140/90 12/21/2012  . Depression 12/21/2012    [place], [place] 09/07/2016, 7:58 PM  [facility] [address] [place], [place], [address] Phone: [phone]   Fax:  [phone]  Name: Phyllis Walker MRN: [id number] Date of Birth: [birth year]

## 2016-09-08 ENCOUNTER — Encounter: Payer: Self-pay | Admitting: Physical Therapy

## 2016-09-08 ENCOUNTER — Ambulatory Visit: Payer: PPO | Admitting: Physical Therapy

## 2016-09-08 DIAGNOSIS — R600 Localized edema: Secondary | ICD-10-CM

## 2016-09-08 DIAGNOSIS — M25661 Stiffness of right knee, not elsewhere classified: Secondary | ICD-10-CM | POA: Diagnosis not present

## 2016-09-08 NOTE — Therapy (Signed)
[name] [place], [place], [address] Phone: [phone]   Fax:  [phone]  Physical Therapy Treatment  Patient Details  Name: Phyllis Walker MRN: [id number] Date of Birth: 18-Jan-1946 Referring Provider: Harden Mo MD.  Encounter Date: 09/08/2016      PT End of Session - 09/08/16 1511    Visit Number 3   Number of Visits 12   Date for PT Re-Evaluation 10/30/16   PT Start Time [id number]   PT Stop Time 1609   PT Time Calculation (min) 54 min   Activity Tolerance Patient tolerated treatment well   Behavior During Therapy [facility] for tasks assessed/performed      Past Medical History:  Diagnosis Date  . Anemia   . Anxiety   . Arthritis   . Cataract   . Depression   . Fibroid tumor 1985   in breast  . History of hiatal hernia   . Hypercholesteremia   . Hypertension   . Hypothyroidism   . Neuropathy ([place])    in feet  . Restless leg syndrome     Past Surgical History:  Procedure Laterality Date  . BREAST LUMPECTOMY  1987   benign fibroid tumor  . BUNIONECTOMY Bilateral   . CATARACT EXTRACTION    . COLONOSCOPY    . THYROIDECTOMY, PARTIAL  1969  . TONSILLECTOMY    . TOTAL KNEE ARTHROPLASTY Right 08/15/2016   Procedure: TOTAL KNEE ARTHROPLASTY;  Surgeon: Vickey Huger, MD;  Location: [place];  Service: Orthopedics;  Laterality: Right;  RNFA    There were no vitals filed for this visit.      Subjective Assessment - 09/08/16 1511    Subjective No new complaints upon arrival. Reports that she went dancing last weekend and had no problems and even danced to a fast song.   Patient Stated Goals Get back to normal.   Currently in Pain? No/denies            [facility] PT Assessment - 09/08/16 0001      Assessment   Medical Diagnosis Right total knee replacement.   Onset Date/Surgical Date 08/15/16   Next MD Visit 10/04/2016     Restrictions   Weight Bearing Restrictions No                      [facility] Adult PT Treatment/Exercise - 09/08/16 0001      Exercises   Exercises Knee/Hip     Knee/Hip Exercises: Aerobic   Stationary Bike L2, seat 5 x10 min     Knee/Hip Exercises: Machines for Strengthening   Cybex Knee Extension 10# 3x10 reps   Cybex Knee Flexion 30# 3x10 reps   Cybex Leg Press 2 pl, seat 7, 3x10 reps     Knee/Hip Exercises: Standing   Lateral Step Up Right;3 sets;10 reps;Hand Hold: 2;Step Height: 6"   Forward Step Up Right;4 sets;10 reps;Hand Hold: 2;Step Height: 6"     Knee/Hip Exercises: Supine   Straight Leg Raises Strengthening;Right;2 sets;10 reps     Modalities   Modalities Electrical Stimulation     Electrical Stimulation   Electrical Stimulation Location R knee   Electrical Stimulation Action IFC   Electrical Stimulation Parameters 1-10 hz x15 min   Electrical Stimulation Goals Pain                  PT Short Term Goals - 08/31/16 1340      PT SHORT TERM GOAL #1   Title  STG's=LTG's.           PT Long Term Goals - 08/31/16 1340      PT LONG TERM GOAL #1   Title Independent with a HEP.   Time 4   Period Weeks   Status New     PT LONG TERM GOAL #2   Title Active right knee flexion to 125 degrees+ so the patient can perform functional tasks and do so with pain not > 2-3/10.   Time 4   Period Weeks   Status New     PT LONG TERM GOAL #3   Title Decrease edema to within 2 cms of non-affected side to assist with pain reduction and range of motion gains.   Time 4   Period Weeks   Status New     PT LONG TERM GOAL #4   Title Perform a reciprocating stair gait with one railing with pain not > 2-3/10.   Time 4   Period Weeks   Status New               Plan - 09/08/16 1602    Clinical Impression Statement Patient presents with no R knee pain or any complaints of limitation to function. Patient able to complete machine strengthening with resistance without complaint. Patient had no difficulty or compensatory strategies  with step up activities. Minimal scabbing present along R knee incision upon observation. Normal electrical stimulation response noted following removal of the modality.   Rehab Potential Excellent   PT Frequency 2x / week   PT Duration 4 weeks   PT Treatment/Interventions ADLs/Self Care Home Management;Cryotherapy;Electrical Stimulation;Therapeutic activities;Therapeutic exercise;Patient/family education;Manual techniques;Passive range of motion;Vasopneumatic Device   PT Next Visit Plan Continue with TKR protocol with bike and higher level strengthening per MPT POC.   Consulted and Agree with Plan of Care Patient      Patient will benefit from skilled therapeutic intervention in order to improve the following deficits and impairments:  Pain, Decreased activity tolerance, Increased edema, Decreased range of motion  Visit Diagnosis: Stiffness of right knee, not elsewhere classified  Localized edema     Problem List Patient Active Problem List   Diagnosis Date Noted  . S/P total knee replacement 08/15/2016  . Other seasonal allergic rhinitis 12/21/2015  . BMI 31.0-31.9,adult 06/19/2015  . Anemia 05/27/2015  . Insomnia 03/09/2015  . Osteoporosis with pathological fracture of ankle and foot 05/22/2014  . Hypothyroidism 05/12/2014  . Hyperlipidemia with target LDL less than 100 05/12/2014  . Restless leg syndrome 02/21/2014  . Neuropathy ([place]) 02/21/2014  . GAD (generalized anxiety disorder) 12/21/2012  . Hypertension, benign essential, goal below 140/90 12/21/2012  . Depression 12/21/2012    Wynelle Fanny, PTA 09/08/2016, [name] [address] [place], [place], [address] Phone: [phone]   Fax:  [phone]  Name: Phyllis Walker MRN: [id number] Date of Birth: March 18, 1946

## 2016-09-13 ENCOUNTER — Other Ambulatory Visit: Payer: Self-pay | Admitting: Nurse Practitioner

## 2016-09-13 DIAGNOSIS — F5101 Primary insomnia: Secondary | ICD-10-CM

## 2016-09-13 DIAGNOSIS — F411 Generalized anxiety disorder: Secondary | ICD-10-CM

## 2016-09-13 NOTE — Telephone Encounter (Signed)
Last filled 12/5. Last seen 06/28/2016

## 2016-09-13 NOTE — Telephone Encounter (Signed)
Please call in xanax and ambien with 1 refills

## 2016-09-14 ENCOUNTER — Ambulatory Visit: Payer: PPO | Admitting: Physical Therapy

## 2016-09-16 ENCOUNTER — Encounter: Payer: PPO | Admitting: Physical Therapy

## 2016-09-20 ENCOUNTER — Encounter: Payer: Self-pay | Admitting: Physical Therapy

## 2016-09-20 ENCOUNTER — Ambulatory Visit: Payer: PPO | Attending: Orthopedic Surgery | Admitting: Physical Therapy

## 2016-09-20 DIAGNOSIS — M25661 Stiffness of right knee, not elsewhere classified: Secondary | ICD-10-CM | POA: Insufficient documentation

## 2016-09-20 DIAGNOSIS — R600 Localized edema: Secondary | ICD-10-CM | POA: Diagnosis not present

## 2016-09-20 NOTE — Therapy (Signed)
[name] [place], [place], [address] Phone: [phone]   Fax:  [phone]  Physical Therapy Treatment  Patient Details  Name: Phyllis Walker MRN: [id number] Date of Birth: 02-15-1946 Referring Provider: Harden Mo MD.  Encounter Date: 09/20/2016      PT End of Session - 09/20/16 1118    Visit Number 4   Number of Visits 12   Date for PT Re-Evaluation 10/30/16   PT Start Time 1117   PT Stop Time 1214   PT Time Calculation (min) 57 min   Activity Tolerance Patient tolerated treatment well   Behavior During Therapy [facility] for tasks assessed/performed      Past Medical History:  Diagnosis Date  . Anemia   . Anxiety   . Arthritis   . Cataract   . Depression   . Fibroid tumor 1985   in breast  . History of hiatal hernia   . Hypercholesteremia   . Hypertension   . Hypothyroidism   . Neuropathy ([place])    in feet  . Restless leg syndrome     Past Surgical History:  Procedure Laterality Date  . BREAST LUMPECTOMY  1987   benign fibroid tumor  . BUNIONECTOMY Bilateral   . CATARACT EXTRACTION    . COLONOSCOPY    . THYROIDECTOMY, PARTIAL  1969  . TONSILLECTOMY    . TOTAL KNEE ARTHROPLASTY Right 08/15/2016   Procedure: TOTAL KNEE ARTHROPLASTY;  Surgeon: Vickey Huger, MD;  Location: [place];  Service: Orthopedics;  Laterality: Right;  RNFA    There were no vitals filed for this visit.      Subjective Assessment - 09/20/16 1118    Subjective Reports that the cold has bothered her knees but has not taken a pain pill in 2-3 weeks.   Patient Stated Goals Get back to normal.   Currently in Pain? No/denies            [facility] PT Assessment - 09/20/16 0001      Assessment   Medical Diagnosis Right total knee replacement.   Onset Date/Surgical Date 08/15/16   Next MD Visit 10/04/2016     Restrictions   Weight Bearing Restrictions No     Observation/Other Assessments-Edema    Edema Circumferential     Circumferential Edema   Circumferential - Right 45.3   Circumferential - Left  43     ROM / Strength   AROM / PROM / Strength AROM     AROM   Overall AROM  Within functional limits for tasks performed   AROM Assessment Site Knee   Right/Left Knee Right   Right Knee Extension 0   Right Knee Flexion 127                     [facility] Adult PT Treatment/Exercise - 09/20/16 0001      Knee/Hip Exercises: Aerobic   Stationary Bike L2, seat 2 x12 min     Knee/Hip Exercises: Machines for Strengthening   Cybex Knee Extension 10# 3x10 reps   Cybex Knee Flexion 30# 3x10 reps   Cybex Leg Press 2 pl, seat 6, 3x10 reps     Knee/Hip Exercises: Standing   Terminal Knee Extension Limitations RLE pink XTS x20 reps   Forward Step Up Right;3 sets;10 reps;Hand Hold: 2;Step Height: 8"     Knee/Hip Exercises: Supine   Straight Leg Raises Strengthening;Right;2 sets;10 reps   Straight Leg Raise with External Rotation Strengthening;Right;2 sets;10 reps  Modalities   Modalities Electrical Stimulation;Moist Heat     Moist Heat Therapy   Number Minutes Moist Heat 15 Minutes   Moist Heat Location Knee     Electrical Stimulation   Electrical Stimulation Location R knee   Electrical Stimulation Action IFC   Electrical Stimulation Parameters 1-10 hz x15 min   Electrical Stimulation Goals Pain                  PT Short Term Goals - 08/31/16 1340      PT SHORT TERM GOAL #1   Title STG's=LTG's.           PT Long Term Goals - 09/20/16 1201      PT LONG TERM GOAL #1   Title Independent with a HEP.   Time 4   Period Weeks   Status On-going     PT LONG TERM GOAL #2   Title Active right knee flexion to 125 degrees+ so the patient can perform functional tasks and do so with pain not > 2-3/10.   Time 4   Period Weeks   Status Achieved  0-127 deg R knee AROM 09/20/2016     PT LONG TERM GOAL #3   Title Decrease edema to within 2 cms of non-affected side to assist with  pain reduction and range of motion gains.   Time 4   Period Weeks   Status Partially Met  2.3 cm difference with R >L 09/20/2016     PT LONG TERM GOAL #4   Title Perform a reciprocating stair gait with one railing with pain not > 2-3/10.   Time 4   Period Weeks   Status On-going               Plan - 09/20/16 1202    Clinical Impression Statement Patient presented in clinic with no current R knee pain or any complaints of difficulty with anything at home.  Patient able to continue machine knee strengthening without complaint and may be able to progress in resistance. Patient able to progress into TKE and step up with 8" steps without complaint. Only minimal weakness with R forward step up to 8" step. Patient has been ambulating stairs at home leading with LLE instead of RLE and patient was educated that she could now lead with RLE with stairs. AROM R knee measured as 0-127 deg and with 2.3 cm difference regarding edema. No scabbing observed along R knee incision today. Patient requested moist heat with electrical stimulation today with normal response.       Rehab Potential Excellent   PT Frequency 2x / week   PT Duration 4 weeks   PT Treatment/Interventions ADLs/Self Care Home Management;Cryotherapy;Electrical Stimulation;Therapeutic activities;Therapeutic exercise;Patient/family education;Manual techniques;Passive range of motion;Vasopneumatic Device   PT Next Visit Plan Continue with TKR protocol with bike and higher level strengthening per MPT POC.   Consulted and Agree with Plan of Care Patient      Patient will benefit from skilled therapeutic intervention in order to improve the following deficits and impairments:  Pain, Decreased activity tolerance, Increased edema, Decreased range of motion  Visit Diagnosis: Stiffness of right knee, not elsewhere classified  Localized edema     Problem List Patient Active Problem List   Diagnosis Date Noted  . S/P total knee  replacement 08/15/2016  . Other seasonal allergic rhinitis 12/21/2015  . BMI 31.0-31.9,adult 06/19/2015  . Anemia 05/27/2015  . Insomnia 03/09/2015  . Osteoporosis with pathological fracture of ankle and foot  05/22/2014  . Hypothyroidism 05/12/2014  . Hyperlipidemia with target LDL less than 100 05/12/2014  . Restless leg syndrome 02/21/2014  . Neuropathy ([place]) 02/21/2014  . GAD (generalized anxiety disorder) 12/21/2012  . Hypertension, benign essential, goal below 140/90 12/21/2012  . Depression 12/21/2012    Wynelle Fanny, PTA 09/20/2016, 12:18 PM  [name] [address] [place], [place], [address] Phone: [phone]   Fax:  [phone]  Name: Phyllis Walker MRN: [id number] Date of Birth: [birth year]

## 2016-09-22 ENCOUNTER — Encounter: Payer: Self-pay | Admitting: Physical Therapy

## 2016-09-22 ENCOUNTER — Ambulatory Visit: Payer: PPO | Admitting: Physical Therapy

## 2016-09-22 DIAGNOSIS — M25661 Stiffness of right knee, not elsewhere classified: Secondary | ICD-10-CM | POA: Diagnosis not present

## 2016-09-22 DIAGNOSIS — R600 Localized edema: Secondary | ICD-10-CM

## 2016-09-22 NOTE — Therapy (Signed)
[name] [place], [place], [address] Phone: [phone]   Fax:  [phone]  Physical Therapy Treatment  Patient Details  Name: Phyllis Walker MRN: [id number] Date of Birth: 18-Jul-1946 Referring Provider: Harden Mo MD.  Encounter Date: 09/22/2016      PT End of Session - 09/22/16 1349    Visit Number 5   Number of Visits 12   Date for PT Re-Evaluation 10/30/16   PT Start Time 0315   PT Stop Time 1433   PT Time Calculation (min) 45 min   Activity Tolerance Patient tolerated treatment well   Behavior During Therapy [facility] for tasks assessed/performed      Past Medical History:  Diagnosis Date  . Anemia   . Anxiety   . Arthritis   . Cataract   . Depression   . Fibroid tumor 1985   in breast  . History of hiatal hernia   . Hypercholesteremia   . Hypertension   . Hypothyroidism   . Neuropathy ([place])    in feet  . Restless leg syndrome     Past Surgical History:  Procedure Laterality Date  . BREAST LUMPECTOMY  1987   benign fibroid tumor  . BUNIONECTOMY Bilateral   . CATARACT EXTRACTION    . COLONOSCOPY    . THYROIDECTOMY, PARTIAL  1969  . TONSILLECTOMY    . TOTAL KNEE ARTHROPLASTY Right 08/15/2016   Procedure: TOTAL KNEE ARTHROPLASTY;  Surgeon: Vickey Huger, MD;  Location: [place];  Service: Orthopedics;  Laterality: Right;  RNFA    There were no vitals filed for this visit.      Subjective Assessment - 09/22/16 1349    Subjective No new complaints today.   Patient Stated Goals Get back to normal.   Currently in Pain? No/denies            [facility] PT Assessment - 09/22/16 0001      Assessment   Medical Diagnosis Right total knee replacement.   Onset Date/Surgical Date 08/15/16   Next MD Visit 10/04/2016     Restrictions   Weight Bearing Restrictions No                     [facility] Adult PT Treatment/Exercise - 09/22/16 0001      Knee/Hip Exercises: Aerobic   Stationary Bike L3,  seat 2 x10 min     Knee/Hip Exercises: Machines for Strengthening   Cybex Knee Extension 20# 3x10 reps   Cybex Knee Flexion 40# 3x10 reps   Cybex Leg Press --     Knee/Hip Exercises: Standing   Terminal Knee Extension Limitations RLE pink XTS x30 reps     Knee/Hip Exercises: Supine   Straight Leg Raises Strengthening;Right;2 sets;10 reps   Straight Leg Raise with External Rotation Strengthening;Right;2 sets;10 reps     Modalities   Modalities Electrical Stimulation;Moist Heat     Moist Heat Therapy   Number Minutes Moist Heat 15 Minutes   Moist Heat Location Knee     Electrical Stimulation   Electrical Stimulation Location R knee   Electrical Stimulation Action IFC   Electrical Stimulation Parameters 80-150 hz x15 min   Electrical Stimulation Goals Pain                  PT Short Term Goals - 08/31/16 1340      PT SHORT TERM GOAL #1   Title STG's=LTG's.           PT Long  Term Goals - 09/20/16 1201      PT LONG TERM GOAL #1   Title Independent with a HEP.   Time 4   Period Weeks   Status On-going     PT LONG TERM GOAL #2   Title Active right knee flexion to 125 degrees+ so the patient can perform functional tasks and do so with pain not > 2-3/10.   Time 4   Period Weeks   Status Achieved  0-127 deg R knee AROM 09/20/2016     PT LONG TERM GOAL #3   Title Decrease edema to within 2 cms of non-affected side to assist with pain reduction and range of motion gains.   Time 4   Period Weeks   Status Partially Met  2.3 cm difference with R >L 09/20/2016     PT LONG TERM GOAL #4   Title Perform a reciprocating stair gait with one railing with pain not > 2-3/10.   Time 4   Period Weeks   Status On-going               Plan - 09/22/16 1424    Clinical Impression Statement Patient presented in clinic with no R knee pain and no complaints. Patient able to progress with machine knee strengthening with resistance with only complaint of increased  difficulty. No complaints with any of the exercises completed except for audible popping of the L knee. Patient continues to request moist heat instead of cryotherapy as she cannot tolerate cryotherapy. Normal modalities response noted following removal of the modalities.    Rehab Potential Excellent   PT Frequency 2x / week   PT Duration 4 weeks   PT Treatment/Interventions ADLs/Self Care Home Management;Cryotherapy;Electrical Stimulation;Therapeutic activities;Therapeutic exercise;Patient/family education;Manual techniques;Passive range of motion;Vasopneumatic Device   PT Next Visit Plan Continue with TKR protocol with bike and higher level strengthening per MPT POC.   Consulted and Agree with Plan of Care Patient      Patient will benefit from skilled therapeutic intervention in order to improve the following deficits and impairments:  Pain, Decreased activity tolerance, Increased edema, Decreased range of motion  Visit Diagnosis: Stiffness of right knee, not elsewhere classified  Localized edema     Problem List Patient Active Problem List   Diagnosis Date Noted  . S/P total knee replacement 08/15/2016  . Other seasonal allergic rhinitis 12/21/2015  . BMI 31.0-31.9,adult 06/19/2015  . Anemia 05/27/2015  . Insomnia 03/09/2015  . Osteoporosis with pathological fracture of ankle and foot 05/22/2014  . Hypothyroidism 05/12/2014  . Hyperlipidemia with target LDL less than 100 05/12/2014  . Restless leg syndrome 02/21/2014  . Neuropathy ([place]) 02/21/2014  . GAD (generalized anxiety disorder) 12/21/2012  . Hypertension, benign essential, goal below 140/90 12/21/2012  . Depression 12/21/2012    Wynelle Fanny, PTA 09/22/2016, 2:43 PM  [name] [address] [place], [place], [address] Phone: [phone]   Fax:  [phone]  Name: Phyllis Walker MRN: [id number] Date of Birth: 12-15-1945

## 2016-09-27 ENCOUNTER — Ambulatory Visit: Payer: PPO | Admitting: Physical Therapy

## 2016-09-27 DIAGNOSIS — R600 Localized edema: Secondary | ICD-10-CM

## 2016-09-27 DIAGNOSIS — M25661 Stiffness of right knee, not elsewhere classified: Secondary | ICD-10-CM | POA: Diagnosis not present

## 2016-09-27 NOTE — Therapy (Signed)
[name] [place], [place], [address] Phone: [phone]   Fax:  [phone]  Physical Therapy Treatment  Patient Details  Name: Phyllis Walker MRN: [id number] Date of Birth: November 13, 1945 Referring Provider: Harden Mo MD.  Encounter Date: 09/27/2016      PT End of Session - 09/27/16 1118    Visit Number 6   Number of Visits 12   Date for PT Re-Evaluation 10/30/16   PT Start Time 1115   PT Stop Time 1212   PT Time Calculation (min) 57 min   Activity Tolerance Patient tolerated treatment well   Behavior During Therapy [facility] for tasks assessed/performed      Past Medical History:  Diagnosis Date  . Anemia   . Anxiety   . Arthritis   . Cataract   . Depression   . Fibroid tumor 1985   in breast  . History of hiatal hernia   . Hypercholesteremia   . Hypertension   . Hypothyroidism   . Neuropathy ([place])    in feet  . Restless leg syndrome     Past Surgical History:  Procedure Laterality Date  . BREAST LUMPECTOMY  1987   benign fibroid tumor  . BUNIONECTOMY Bilateral   . CATARACT EXTRACTION    . COLONOSCOPY    . THYROIDECTOMY, PARTIAL  1969  . TONSILLECTOMY    . TOTAL KNEE ARTHROPLASTY Right 08/15/2016   Procedure: TOTAL KNEE ARTHROPLASTY;  Surgeon: Vickey Huger, MD;  Location: [facility];  Service: Orthopedics;  Laterality: Right;  RNFA    There were no vitals filed for this visit.      Subjective Assessment - 09/27/16 1119    Subjective I'm doing really well.   Patient Stated Goals Get back to normal.   Currently in Pain? No/denies            [facility] PT Assessment - 09/27/16 0001      Circumferential Edema   Circumferential - Right 49 cm                     [facility] Adult PT Treatment/Exercise - 09/27/16 0001      Knee/Hip Exercises: Aerobic   Stationary Bike L3, seat 2 x2 min   Nustep L4 x 10 min seat 6 for ROM     Knee/Hip Exercises: Machines for Strengthening   Cybex Knee  Extension 20# 3x10 reps   Cybex Knee Flexion 40# 3x10 reps     Knee/Hip Exercises: Supine   Straight Leg Raises Strengthening;Right;2 sets;10 reps   Straight Leg Raises Limitations c/o of pain in hip afterwards   Straight Leg Raise with External Rotation Strengthening;Right;4 sets;5 reps     Modalities   Modalities Electrical Stimulation     Electrical Stimulation   Electrical Stimulation Location R knee IFC 1-10 Hz x 15 min   Electrical Stimulation Goals Edema                  PT Short Term Goals - 08/31/16 1340      PT SHORT TERM GOAL #1   Title STG's=LTG's.           PT Long Term Goals - 09/27/16 1159      PT LONG TERM GOAL #1   Title Independent with a HEP.   Time 4   Period Weeks   Status On-going     PT LONG TERM GOAL #2   Title Active right knee flexion to 125 degrees+ so the  patient can perform functional tasks and do so with pain not > 2-3/10.   Time 4   Period Weeks   Status Achieved     PT LONG TERM GOAL #3   Title Decrease edema to within 2 cms of non-affected side to assist with pain reduction and range of motion gains.   Time 4   Period Weeks   Status On-going     PT LONG TERM GOAL #4   Title Perform a reciprocating stair gait with one railing with pain not > 2-3/10.   Time 4   Period Weeks   Status Partially Met               Plan - 09/27/16 1200    Clinical Impression Statement Patient presents today with no knee pain or complaints. She tolerated therex well with some c/o pain in R hip with SLR, but likely due to compensation, so sets were broken up to allow more rest. She reports that she is able to climb stairs with reciprocal gait however this was not assessed today. She also demonstrated increased swelling in the R knee today compared to last assessment. Normal response to estim for edema.   Rehab Potential Excellent   PT Frequency 2x / week   PT Duration 4 weeks   PT Treatment/Interventions ADLs/Self Care Home  Management;Cryotherapy;Electrical Stimulation;Therapeutic activities;Therapeutic exercise;Patient/family education;Manual techniques;Passive range of motion;Vasopneumatic Device   PT Next Visit Plan MD note for visit 10/04/16. Continue with TKR protocol with bike and higher level strengthening per MPT POC.   Consulted and Agree with Plan of Care Patient      Patient will benefit from skilled therapeutic intervention in order to improve the following deficits and impairments:  Pain, Decreased activity tolerance, Increased edema, Decreased range of motion  Visit Diagnosis: Stiffness of right knee, not elsewhere classified  Localized edema     Problem List Patient Active Problem List   Diagnosis Date Noted  . S/P total knee replacement 08/15/2016  . Other seasonal allergic rhinitis 12/21/2015  . BMI 31.0-31.9,adult 06/19/2015  . Anemia 05/27/2015  . Insomnia 03/09/2015  . Osteoporosis with pathological fracture of ankle and foot 05/22/2014  . Hypothyroidism 05/12/2014  . Hyperlipidemia with target LDL less than 100 05/12/2014  . Restless leg syndrome 02/21/2014  . Neuropathy ([place]) 02/21/2014  . GAD (generalized anxiety disorder) 12/21/2012  . Hypertension, benign essential, goal below 140/90 12/21/2012  . Depression 12/21/2012   Madelyn Flavors PT 09/27/2016, 12:15 PM  [name] [address] [place], [place], [address] Phone: [phone]   Fax:  [phone]  Name: Phyllis Walker MRN: [id number] Date of Birth: 1946/04/30

## 2016-09-29 ENCOUNTER — Encounter: Payer: PPO | Admitting: Physical Therapy

## 2016-10-06 ENCOUNTER — Ambulatory Visit: Payer: PPO | Admitting: *Deleted

## 2016-10-06 DIAGNOSIS — R600 Localized edema: Secondary | ICD-10-CM

## 2016-10-06 DIAGNOSIS — M25661 Stiffness of right knee, not elsewhere classified: Secondary | ICD-10-CM | POA: Diagnosis not present

## 2016-10-06 NOTE — Therapy (Signed)
[name] [place], [place], [address] Phone: [phone]   Fax:  [phone]  Physical Therapy Treatment  Patient Details  Name: Phyllis Walker MRN: [id number] Date of Birth: [birth year] Referring Provider: Harden Mo MD.  Encounter Date: 10/06/2016      PT End of Session - 10/06/16 3419    Visit Number 7   Number of Visits 12   Date for PT Re-Evaluation 10/30/16   PT Start Time 0815   PT Stop Time 0915   PT Time Calculation (min) 60 min      Past Medical History:  Diagnosis Date  . Anemia   . Anxiety   . Arthritis   . Cataract   . Depression   . Fibroid tumor 1985   in breast  . History of hiatal hernia   . Hypercholesteremia   . Hypertension   . Hypothyroidism   . Neuropathy ([place])    in feet  . Restless leg syndrome     Past Surgical History:  Procedure Laterality Date  . BREAST LUMPECTOMY  1987   benign fibroid tumor  . BUNIONECTOMY Bilateral   . CATARACT EXTRACTION    . COLONOSCOPY    . THYROIDECTOMY, PARTIAL  1969  . TONSILLECTOMY    . TOTAL KNEE ARTHROPLASTY Right 08/15/2016   Procedure: TOTAL KNEE ARTHROPLASTY;  Surgeon: Vickey Huger, MD;  Location: [facility];  Service: Orthopedics;  Laterality: Right;  RNFA    There were no vitals filed for this visit.      Subjective Assessment - 10/06/16 3790    Subjective MD wants me to continue PT for 6 more visits.   Patient Stated Goals Get back to normal.   Currently in Pain? No/denies                         [facility] Adult PT Treatment/Exercise - 10/06/16 0001      Knee/Hip Exercises: Aerobic   Stationary Bike L2, seat 2 x15 min     Knee/Hip Exercises: Machines for Strengthening   Cybex Knee Extension 20# 3x10 reps   Cybex Knee Flexion 30# 3x10 reps   Cybex Leg Press 2 pl, seat 6, 3x10 reps     Knee/Hip Exercises: Standing   Rocker Board 5 minutes  calf stretching     Modalities   Modalities Electrical Stimulation     Electrical Stimulation   Electrical Stimulation Location R knee IFC 1-10 Hz x 15 min   Electrical Stimulation Goals Edema                  PT Short Term Goals - 08/31/16 1340      PT SHORT TERM GOAL #1   Title STG's=LTG's.           PT Long Term Goals - 09/27/16 1159      PT LONG TERM GOAL #1   Title Independent with a HEP.   Time 4   Period Weeks   Status On-going     PT LONG TERM GOAL #2   Title Active right knee flexion to 125 degrees+ so the patient can perform functional tasks and do so with pain not > 2-3/10.   Time 4   Period Weeks   Status Achieved     PT LONG TERM GOAL #3   Title Decrease edema to within 2 cms of non-affected side to assist with pain reduction and range of motion gains.   Time 4  Period Weeks   Status On-going     PT LONG TERM GOAL #4   Title Perform a reciprocating stair gait with one railing with pain not > 2-3/10.   Time 4   Period Weeks   Status Partially Met               Plan - 10/06/16 8343    Clinical Impression Statement Pt returns to PT today after MD F/U and MD was pleased with her current status. He wanted her to complete 6 more visits of PT and then DC. She did great with Therex for RT knee and only had mild soreness. She still has some swelling in RT knee and feels that IFC is helping.   Rehab Potential Excellent   PT Frequency 2x / week   PT Duration 4 weeks   PT Treatment/Interventions ADLs/Self Care Home Management;Cryotherapy;Electrical Stimulation;Therapeutic activities;Therapeutic exercise;Patient/family education;Manual techniques;Passive range of motion;Vasopneumatic Device   PT Next Visit Plan  Continue with TKR protocol with bike and higher level strengthening per MPT POC. x 5 more visits and DC to HEP as per MD   Consulted and Agree with Plan of Care Patient      Patient will benefit from skilled therapeutic intervention in order to improve the following deficits and impairments:  Pain,  Decreased activity tolerance, Increased edema, Decreased range of motion  Visit Diagnosis: Stiffness of right knee, not elsewhere classified  Localized edema     Problem List Patient Active Problem List   Diagnosis Date Noted  . S/P total knee replacement 08/15/2016  . Other seasonal allergic rhinitis 12/21/2015  . BMI 31.0-31.9,adult 06/19/2015  . Anemia 05/27/2015  . Insomnia 03/09/2015  . Osteoporosis with pathological fracture of ankle and foot 05/22/2014  . Hypothyroidism 05/12/2014  . Hyperlipidemia with target LDL less than 100 05/12/2014  . Restless leg syndrome 02/21/2014  . Neuropathy ([place]) 02/21/2014  . GAD (generalized anxiety disorder) 12/21/2012  . Hypertension, benign essential, goal below 140/90 12/21/2012  . Depression 12/21/2012    RAMSEUR,CHRIS 10/06/2016, 9:26 AM  [facility] [place], [place], [address] Phone: [phone]   Fax:  [phone]  Name: Phyllis Walker MRN: [id number] Date of Birth: [birth year]

## 2016-10-11 ENCOUNTER — Ambulatory Visit: Payer: PPO | Admitting: *Deleted

## 2016-10-11 DIAGNOSIS — M25661 Stiffness of right knee, not elsewhere classified: Secondary | ICD-10-CM | POA: Diagnosis not present

## 2016-10-11 DIAGNOSIS — R600 Localized edema: Secondary | ICD-10-CM

## 2016-10-11 NOTE — Therapy (Signed)
[name] [place], [place], [address] Phone: [phone]   Fax:  [phone]  Physical Therapy Treatment  Patient Details  Name: Phyllis Walker MRN: [id number] Date of Birth: 10-07-1945 Referring Provider: Harden Mo MD.  Encounter Date: 10/11/2016      PT End of Session - 10/11/16 0821    Visit Number 8   Number of Visits 12   Date for PT Re-Evaluation 10/30/16   PT Start Time 0815   PT Stop Time 0916   PT Time Calculation (min) 61 min      Past Medical History:  Diagnosis Date  . Anemia   . Anxiety   . Arthritis   . Cataract   . Depression   . Fibroid tumor 1985   in breast  . History of hiatal hernia   . Hypercholesteremia   . Hypertension   . Hypothyroidism   . Neuropathy ([place])    in feet  . Restless leg syndrome     Past Surgical History:  Procedure Laterality Date  . BREAST LUMPECTOMY  1987   benign fibroid tumor  . BUNIONECTOMY Bilateral   . CATARACT EXTRACTION    . COLONOSCOPY    . THYROIDECTOMY, PARTIAL  1969  . TONSILLECTOMY    . TOTAL KNEE ARTHROPLASTY Right 08/15/2016   Procedure: TOTAL KNEE ARTHROPLASTY;  Surgeon: Vickey Huger, MD;  Location: [facility];  Service: Orthopedics;  Laterality: Right;  RNFA    There were no vitals filed for this visit.      Subjective Assessment - 10/11/16 0820    Subjective   RT knee is sore today   Patient Stated Goals Get back to normal.   Currently in Pain? No/denies                         [facility] Adult PT Treatment/Exercise - 10/11/16 0001      Knee/Hip Exercises: Aerobic   Stationary Bike L2, seat 2 x20 min     Knee/Hip Exercises: Machines for Strengthening   Cybex Knee Extension 20# 3x10 reps   Cybex Knee Flexion 30# 3x10 reps   Cybex Leg Press 2 pl, seat 6, 3x10 reps     Knee/Hip Exercises: Standing   Rocker Board 5 minutes  calf stretching     Modalities   Modalities Electrical Stimulation;Moist Heat     Moist Heat  Therapy   Number Minutes Moist Heat 15 Minutes   Moist Heat Location Knee     Electrical Stimulation   Electrical Stimulation Location R knee IFC 1-10 Hz x 15 min   Electrical Stimulation Goals Edema                  PT Short Term Goals - 08/31/16 1340      PT SHORT TERM GOAL #1   Title STG's=LTG's.           PT Long Term Goals - 09/27/16 1159      PT LONG TERM GOAL #1   Title Independent with a HEP.   Time 4   Period Weeks   Status On-going     PT LONG TERM GOAL #2   Title Active right knee flexion to 125 degrees+ so the patient can perform functional tasks and do so with pain not > 2-3/10.   Time 4   Period Weeks   Status Achieved     PT LONG TERM GOAL #3   Title Decrease edema to within  2 cms of non-affected side to assist with pain reduction and range of motion gains.   Time 4   Period Weeks   Status On-going     PT LONG TERM GOAL #4   Title Perform a reciprocating stair gait with one railing with pain not > 2-3/10.   Time 4   Period Weeks   Status Partially Met               Plan - 10/11/16 4997    Clinical Impression Statement Pt arrives to clinic today with mild soreness in RT knee, but doing well.  She was able to complete all therex today with mild discomfort 1-2/10 and still has mild swelling. She has 4 visits left after today.   Rehab Potential Excellent   PT Frequency 2x / week   PT Duration 4 weeks   PT Treatment/Interventions ADLs/Self Care Home Management;Cryotherapy;Electrical Stimulation;Therapeutic activities;Therapeutic exercise;Patient/family education;Manual techniques;Passive range of motion;Vasopneumatic Device   PT Next Visit Plan  Continue with TKR protocol with bike and higher level strengthening per MPT POC. x 5 more visits and DC to HEP as per MD   Consulted and Agree with Plan of Care Patient      Patient will benefit from skilled therapeutic intervention in order to improve the following deficits and  impairments:  Pain, Decreased activity tolerance, Increased edema, Decreased range of motion  Visit Diagnosis: Stiffness of right knee, not elsewhere classified  Localized edema     Problem List Patient Active Problem List   Diagnosis Date Noted  . S/P total knee replacement 08/15/2016  . Other seasonal allergic rhinitis 12/21/2015  . BMI 31.0-31.9,adult 06/19/2015  . Anemia 05/27/2015  . Insomnia 03/09/2015  . Osteoporosis with pathological fracture of ankle and foot 05/22/2014  . Hypothyroidism 05/12/2014  . Hyperlipidemia with target LDL less than 100 05/12/2014  . Restless leg syndrome 02/21/2014  . Neuropathy ([place]) 02/21/2014  . GAD (generalized anxiety disorder) 12/21/2012  . Hypertension, benign essential, goal below 140/90 12/21/2012  . Depression 12/21/2012    RAMSEUR,CHRIS 10/11/2016, 9:23 AM  [facility] [place], [place], [address] Phone: [phone]   Fax:  [phone]  Name: Phyllis Walker MRN: [id number] Date of Birth: 06-02-1946

## 2016-10-13 ENCOUNTER — Ambulatory Visit: Payer: PPO | Attending: Orthopedic Surgery | Admitting: Physical Therapy

## 2016-10-13 ENCOUNTER — Encounter: Payer: Self-pay | Admitting: Physical Therapy

## 2016-10-13 DIAGNOSIS — R600 Localized edema: Secondary | ICD-10-CM | POA: Diagnosis not present

## 2016-10-13 DIAGNOSIS — M25661 Stiffness of right knee, not elsewhere classified: Secondary | ICD-10-CM | POA: Insufficient documentation

## 2016-10-13 NOTE — Therapy (Signed)
[name] [place], [place], [address] Phone: [phone]   Fax:  [phone]  Physical Therapy Treatment  Patient Details  Name: Phyllis Walker MRN: [id number] Date of Birth: July 25, 1946 Referring Provider: Harden Mo MD.  Encounter Date: 10/13/2016      PT End of Session - 10/13/16 1119    Visit Number 9   Number of Visits 12   Date for PT Re-Evaluation 10/30/16   PT Start Time 1118   PT Stop Time 1210   PT Time Calculation (min) 52 min   Activity Tolerance Patient tolerated treatment well   Behavior During Therapy [facility] for tasks assessed/performed      Past Medical History:  Diagnosis Date  . Anemia   . Anxiety   . Arthritis   . Cataract   . Depression   . Fibroid tumor 1985   in breast  . History of hiatal hernia   . Hypercholesteremia   . Hypertension   . Hypothyroidism   . Neuropathy ([place])    in feet  . Restless leg syndrome     Past Surgical History:  Procedure Laterality Date  . BREAST LUMPECTOMY  1987   benign fibroid tumor  . BUNIONECTOMY Bilateral   . CATARACT EXTRACTION    . COLONOSCOPY    . THYROIDECTOMY, PARTIAL  1969  . TONSILLECTOMY    . TOTAL KNEE ARTHROPLASTY Right 08/15/2016   Procedure: TOTAL KNEE ARTHROPLASTY;  Surgeon: Vickey Huger, MD;  Location: [facility];  Service: Orthopedics;  Laterality: Right;  RNFA    There were no vitals filed for this visit.      Subjective Assessment - 10/13/16 1118    Subjective No new complaints today.   Patient Stated Goals Get back to normal.   Currently in Pain? No/denies                         [facility] Adult PT Treatment/Exercise - 10/13/16 0001      Knee/Hip Exercises: Aerobic   Stationary Bike L2, seat 5 x15 min     Knee/Hip Exercises: Machines for Strengthening   Cybex Knee Extension 20# 3x10 reps   Cybex Knee Flexion 40# 3x10 reps   Cybex Leg Press 2 pl, seat 6, 3x10 reps     Knee/Hip Exercises: Standing   Forward  Step Up Right;3 sets;10 reps;Hand Hold: 2;Step Height: 8"   Step Down Right;2 sets;10 reps;Hand Hold: 2;Step Height: 4"     Modalities   Modalities Electrical Stimulation;Moist Heat     Moist Heat Therapy   Number Minutes Moist Heat 15 Minutes   Moist Heat Location Knee     Electrical Stimulation   Electrical Stimulation Location R knee   Electrical Stimulation Action IFC   Electrical Stimulation Parameters 1-10 hz x15 min   Electrical Stimulation Goals Edema                  PT Short Term Goals - 08/31/16 1340      PT SHORT TERM GOAL #1   Title STG's=LTG's.           PT Long Term Goals - 09/27/16 1159      PT LONG TERM GOAL #1   Title Independent with a HEP.   Time 4   Period Weeks   Status On-going     PT LONG TERM GOAL #2   Title Active right knee flexion to 125 degrees+ so the patient can perform functional  tasks and do so with pain not > 2-3/10.   Time 4   Period Weeks   Status Achieved     PT LONG TERM GOAL #3   Title Decrease edema to within 2 cms of non-affected side to assist with pain reduction and range of motion gains.   Time 4   Period Weeks   Status On-going     PT LONG TERM GOAL #4   Title Perform a reciprocating stair gait with one railing with pain not > 2-3/10.   Time 4   Period Weeks   Status Partially Met               Plan - 10/13/16 1201    Clinical Impression Statement Patient arrived to treatment with no complaints and no pain. Patient able to progress with strengthening with no complaints regarding pain. Patient required minimal multimodal cueing for proper exercise technique. Patient requested moist heat with electrical stimulation again today as she has low cold tolerance.   Rehab Potential Excellent   PT Frequency 2x / week   PT Duration 4 weeks   PT Treatment/Interventions ADLs/Self Care Home Management;Cryotherapy;Electrical Stimulation;Therapeutic activities;Therapeutic exercise;Patient/family  education;Manual techniques;Passive range of motion;Vasopneumatic Device   PT Next Visit Plan  Continue with TKR protocol with bike and higher level strengthening per MPT POC. x 5 more visits and DC to HEP as per MD   Consulted and Agree with Plan of Care Patient      Patient will benefit from skilled therapeutic intervention in order to improve the following deficits and impairments:  Pain, Decreased activity tolerance, Increased edema, Decreased range of motion  Visit Diagnosis: Stiffness of right knee, not elsewhere classified  Localized edema     Problem List Patient Active Problem List   Diagnosis Date Noted  . S/P total knee replacement 08/15/2016  . Other seasonal allergic rhinitis 12/21/2015  . BMI 31.0-31.9,adult 06/19/2015  . Anemia 05/27/2015  . Insomnia 03/09/2015  . Osteoporosis with pathological fracture of ankle and foot 05/22/2014  . Hypothyroidism 05/12/2014  . Hyperlipidemia with target LDL less than 100 05/12/2014  . Restless leg syndrome 02/21/2014  . Neuropathy ([place]) 02/21/2014  . GAD (generalized anxiety disorder) 12/21/2012  . Hypertension, benign essential, goal below 140/90 12/21/2012  . Depression 12/21/2012    Wynelle Fanny, PTA 10/13/2016, 12:12 PM  [facility] [address] [place], [place], [address] Phone: [phone]   Fax:  [phone]  Name: Phyllis Walker MRN: [id number] Date of Birth: 1946-07-30

## 2016-10-18 ENCOUNTER — Ambulatory Visit: Payer: PPO | Admitting: Physical Therapy

## 2016-10-18 DIAGNOSIS — M25661 Stiffness of right knee, not elsewhere classified: Secondary | ICD-10-CM

## 2016-10-18 DIAGNOSIS — R600 Localized edema: Secondary | ICD-10-CM

## 2016-10-18 NOTE — Therapy (Signed)
[name] [place], [place], [address] Phone: [phone]   Fax:  [phone]  Physical Therapy Treatment  Patient Details  Name: Phyllis Walker MRN: [id number] Date of Birth: 27-Aug-1946 Referring Provider: Harden Mo MD.  Encounter Date: 10/18/2016      PT End of Session - 10/18/16 1518    Visit Number 10   Number of Visits 12   Date for PT Re-Evaluation 10/30/16   PT Start Time 0235      Past Medical History:  Diagnosis Date  . Anemia   . Anxiety   . Arthritis   . Cataract   . Depression   . Fibroid tumor 1985   in breast  . History of hiatal hernia   . Hypercholesteremia   . Hypertension   . Hypothyroidism   . Neuropathy ([place])    in feet  . Restless leg syndrome     Past Surgical History:  Procedure Laterality Date  . BREAST LUMPECTOMY  1987   benign fibroid tumor  . BUNIONECTOMY Bilateral   . CATARACT EXTRACTION    . COLONOSCOPY    . THYROIDECTOMY, PARTIAL  1969  . TONSILLECTOMY    . TOTAL KNEE ARTHROPLASTY Right 08/15/2016   Procedure: TOTAL KNEE ARTHROPLASTY;  Surgeon: Vickey Huger, MD;  Location: [facility];  Service: Orthopedics;  Laterality: Right;  RNFA    There were no vitals filed for this visit.      Subjective Assessment - 10/18/16 1521    Subjective Dr. was very pleased.  I have 2 more visits after this one.   Patient Stated Goals Get back to normal.   Currently in Pain? Yes   Pain Score 1    Pain Location Knee   Pain Orientation Right   Pain Descriptors / Indicators Dull   Pain Type Surgical pain   Pain Onset More than a month ago   Pain Frequency Intermittent     Treatment:  Stationary bike x 17 minutes f/b knee ext with 10# x 3 minutes f/b ham curls with 40# x 3 minutes f/b Leg press with 2 plates 3 minutes.  HMP and IFC to patient's right knee x 15 minutes.  Excellent job today.                              PT Short Term Goals - 08/31/16 1340       PT SHORT TERM GOAL #1   Title STG's=LTG's.           PT Long Term Goals - 09/27/16 1159      PT LONG TERM GOAL #1   Title Independent with a HEP.   Time 4   Period Weeks   Status On-going     PT LONG TERM GOAL #2   Title Active right knee flexion to 125 degrees+ so the patient can perform functional tasks and do so with pain not > 2-3/10.   Time 4   Period Weeks   Status Achieved     PT LONG TERM GOAL #3   Title Decrease edema to within 2 cms of non-affected side to assist with pain reduction and range of motion gains.   Time 4   Period Weeks   Status On-going     PT LONG TERM GOAL #4   Title Perform a reciprocating stair gait with one railing with pain not > 2-3/10.   Time 4   Period Weeks  Status Partially Met             Patient will benefit from skilled therapeutic intervention in order to improve the following deficits and impairments:  Pain, Decreased activity tolerance, Increased edema, Decreased range of motion  Visit Diagnosis: Stiffness of right knee, not elsewhere classified  Localized edema       G-Codes - 11/17/2016 1518    Functional Assessment Tool Used Clinical judgement......10th visit.   Functional Limitation Mobility: Walking and moving around   Mobility: Walking and Moving Around Current Status [phone]) At least 1 percent but less than 20 percent impaired, limited or restricted   Mobility: Walking and Moving Around Goal Status [phone]) At least 1 percent but less than 20 percent impaired, limited or restricted      Problem List Patient Active Problem List   Diagnosis Date Noted  . S/P total knee replacement 08/15/2016  . Other seasonal allergic rhinitis 12/21/2015  . BMI 31.0-31.9,adult 06/19/2015  . Anemia 05/27/2015  . Insomnia 03/09/2015  . Osteoporosis with pathological fracture of ankle and foot 05/22/2014  . Hypothyroidism 05/12/2014  . Hyperlipidemia with target LDL less than 100 05/12/2014  . Restless leg syndrome  02/21/2014  . Neuropathy ([place]) 02/21/2014  . GAD (generalized anxiety disorder) 12/21/2012  . Hypertension, benign essential, goal below 140/90 12/21/2012  . Depression 12/21/2012    APPLEGATE, [place] MPT 2016/11/17, 3:24 PM  [facility] [address] [place], [place], [address] Phone: [phone]   Fax:  [phone]  Name: Phyllis Walker MRN: [id number] Date of Birth: 1946-05-15

## 2016-10-20 ENCOUNTER — Encounter: Payer: Self-pay | Admitting: Physical Therapy

## 2016-10-20 ENCOUNTER — Ambulatory Visit: Payer: PPO | Admitting: Physical Therapy

## 2016-10-20 DIAGNOSIS — M25661 Stiffness of right knee, not elsewhere classified: Secondary | ICD-10-CM | POA: Diagnosis not present

## 2016-10-20 DIAGNOSIS — R600 Localized edema: Secondary | ICD-10-CM

## 2016-10-20 NOTE — Therapy (Signed)
[name] [place], [place], [address] Phone: [phone]   Fax:  [phone]  Physical Therapy Treatment  Patient Details  Name: Phyllis Walker MRN: [id number] Date of Birth: [birth year] Referring Provider: Harden Mo MD.  Encounter Date: 10/20/2016      PT End of Session - 10/20/16 1134    Visit Number 11   Number of Visits 12   Date for PT Re-Evaluation 10/30/16   PT Start Time 1114   PT Stop Time 1203   PT Time Calculation (min) 49 min   Activity Tolerance Patient tolerated treatment well   Behavior During Therapy [facility] for tasks assessed/performed      Past Medical History:  Diagnosis Date  . Anemia   . Anxiety   . Arthritis   . Cataract   . Depression   . Fibroid tumor 1985   in breast  . History of hiatal hernia   . Hypercholesteremia   . Hypertension   . Hypothyroidism   . Neuropathy ([place])    in feet  . Restless leg syndrome     Past Surgical History:  Procedure Laterality Date  . BREAST LUMPECTOMY  1987   benign fibroid tumor  . BUNIONECTOMY Bilateral   . CATARACT EXTRACTION    . COLONOSCOPY    . THYROIDECTOMY, PARTIAL  1969  . TONSILLECTOMY    . TOTAL KNEE ARTHROPLASTY Right 08/15/2016   Procedure: TOTAL KNEE ARTHROPLASTY;  Surgeon: Vickey Huger, MD;  Location: [place];  Service: Orthopedics;  Laterality: Right;  RNFA    There were no vitals filed for this visit.      Subjective Assessment - 10/20/16 1120    Subjective Patient arrived with no complaints of pain    Patient Stated Goals Get back to normal.   Currently in Pain? No/denies                         [facility] Adult PT Treatment/Exercise - 10/20/16 0001      Knee/Hip Exercises: Aerobic   Stationary Bike L2, seat 5 x15 min     Knee/Hip Exercises: Machines for Strengthening   Cybex Knee Extension 20# 3x10 reps   Cybex Knee Flexion 40# 3x10 reps   Cybex Leg Press 2 pl, seat 6, 3x10 reps     Moist Heat Therapy   Number Minutes Moist Heat 15 Minutes   Moist Heat Location Knee     Electrical Stimulation   Electrical Stimulation Location R knee   Electrical Stimulation Action IFC   Electrical Stimulation Parameters 1-_0  x14mn   Electrical Stimulation Goals Edema                  PT Short Term Goals - 08/31/16 1340      PT SHORT TERM GOAL #1   Title STG's=LTG's.           PT Long Term Goals - 09/27/16 1159      PT LONG TERM GOAL #1   Title Independent with a HEP.   Time 4   Period Weeks   Status On-going     PT LONG TERM GOAL #2   Title Active right knee flexion to 125 degrees+ so the patient can perform functional tasks and do so with pain not > 2-3/10.   Time 4   Period Weeks   Status Achieved     PT LONG TERM GOAL #3   Title Decrease edema to within 2 cms of  non-affected side to assist with pain reduction and range of motion gains.   Time 4   Period Weeks   Status On-going     PT LONG TERM GOAL #4   Title Perform a reciprocating stair gait with one railing with pain not > 2-3/10.   Time 4   Period Weeks   Status Partially Met               Plan - 10/20/16 1148    Clinical Impression Statement Patient tolerated treatment very well today, has reported little discomfort overall. Patient is able to perform all ADL's and is independent with all activities. Patient requested heat/ES after exercises. Patient would like to DC after next visit to HEP. Progressing toward goals yet limited due to edema defict.   Rehab Potential Excellent   PT Frequency 2x / week   PT Duration 4 weeks   PT Treatment/Interventions ADLs/Self Care Home Management;Cryotherapy;Electrical Stimulation;Therapeutic activities;Therapeutic exercise;Patient/family education;Manual techniques;Passive range of motion;Vasopneumatic Device   PT Next Visit Plan DC next visit with HEP   Consulted and Agree with Plan of Care Patient      Patient will benefit from skilled therapeutic  intervention in order to improve the following deficits and impairments:  Pain, Decreased activity tolerance, Increased edema, Decreased range of motion  Visit Diagnosis: Stiffness of right knee, not elsewhere classified  Localized edema     Problem List Patient Active Problem List   Diagnosis Date Noted  . S/P total knee replacement 08/15/2016  . Other seasonal allergic rhinitis 12/21/2015  . BMI 31.0-31.9,adult 06/19/2015  . Anemia 05/27/2015  . Insomnia 03/09/2015  . Osteoporosis with pathological fracture of ankle and foot 05/22/2014  . Hypothyroidism 05/12/2014  . Hyperlipidemia with target LDL less than 100 05/12/2014  . Restless leg syndrome 02/21/2014  . Neuropathy ([place]) 02/21/2014  . GAD (generalized anxiety disorder) 12/21/2012  . Hypertension, benign essential, goal below 140/90 12/21/2012  . Depression 12/21/2012    Phillips Climes, PTA 10/20/2016, 12:03 PM  [name] [address] [place], [place], [address] Phone: [phone]   Fax:  [phone]  Name: Phyllis Walker MRN: [id number] Date of Birth: 1946/02/24

## 2016-10-25 ENCOUNTER — Ambulatory Visit: Payer: PPO | Admitting: Physical Therapy

## 2016-10-25 DIAGNOSIS — M25661 Stiffness of right knee, not elsewhere classified: Secondary | ICD-10-CM

## 2016-10-25 NOTE — Therapy (Signed)
[name] [place], [place], [address] Phone: [phone]   Fax:  [phone]  Physical Therapy Treatment  Patient Details  Name: Phyllis Walker MRN: [id number] Date of Birth: 1946-06-21 Referring Provider: Harden Mo MD.  Encounter Date: 10/25/2016      PT End of Session - 10/25/16 1038    Visit Number 12   Number of Visits 12   Date for PT Re-Evaluation 10/30/16   PT Start Time 1030   PT Stop Time 1115   PT Time Calculation (min) 45 min   Activity Tolerance Patient tolerated treatment well   Behavior During Therapy [facility] for tasks assessed/performed      Past Medical History:  Diagnosis Date  . Anemia   . Anxiety   . Arthritis   . Cataract   . Depression   . Fibroid tumor 1985   in breast  . History of hiatal hernia   . Hypercholesteremia   . Hypertension   . Hypothyroidism   . Neuropathy ([place])    in feet  . Restless leg syndrome     Past Surgical History:  Procedure Laterality Date  . BREAST LUMPECTOMY  1987   benign fibroid tumor  . BUNIONECTOMY Bilateral   . CATARACT EXTRACTION    . COLONOSCOPY    . THYROIDECTOMY, PARTIAL  1969  . TONSILLECTOMY    . TOTAL KNEE ARTHROPLASTY Right 08/15/2016   Procedure: TOTAL KNEE ARTHROPLASTY;  Surgeon: Vickey Huger, MD;  Location: [place];  Service: Orthopedics;  Laterality: Right;  RNFA    There were no vitals filed for this visit.      Subjective Assessment - 10/25/16 1039    Subjective Patient reports no problems with her knee. She goes dancing every weekend.   Patient Stated Goals Get back to normal.   Currently in Pain? No/denies                         [facility] Adult PT Treatment/Exercise - 10/25/16 0001      Knee/Hip Exercises: Aerobic   Stationary Bike L2, seat 5 x20 min     Knee/Hip Exercises: Machines for Strengthening   Cybex Knee Extension 20# 3x10 reps   Cybex Knee Flexion 40# 3x10 reps   Cybex Leg Press 2 pl, seat 6, 3x10  reps     Knee/Hip Exercises: Standing   Forward Step Up Right;3 sets;10 reps;Hand Hold: 2;Step Height: 8"   Step Down Right;10 reps;Hand Hold: 2;Step Height: 4";3 sets                  PT Short Term Goals - 08/31/16 1340      PT SHORT TERM GOAL #1   Title STG's=LTG's.           PT Long Term Goals - 10/25/16 1115      PT LONG TERM GOAL #1   Title Independent with a HEP.   Time 4   Period Weeks   Status Achieved     PT LONG TERM GOAL #2   Title Active right knee flexion to 125 degrees+ so the patient can perform functional tasks and do so with pain not > 2-3/10.   Time 4   Period Weeks   Status Achieved     PT LONG TERM GOAL #3   Title Decrease edema to within 2 cms of non-affected side to assist with pain reduction and range of motion gains.   Baseline 1.5 cm >  on R on 10/25/16   Time 4   Period Weeks   Status Achieved     PT LONG TERM GOAL #4   Title Perform a reciprocating stair gait with one railing with pain not > 2-3/10.   Time 4   Period Weeks   Status Achieved               Plan - 10/25/16 1121    Clinical Impression Statement Patient did very well with treatment today. She has met all LTGs and was discharged. She demonstrates good R knee functional strength, however with descending stairs she rotates her hip externally out of habit. She is able to perform step downs with good form and strength when cued verbally. She continues to have mild swelling in R knee, but it measured just 1.5 cm greater than left today.   Rehab Potential Excellent   PT Frequency 2x / week   PT Duration 4 weeks   PT Treatment/Interventions ADLs/Self Care Home Management;Cryotherapy;Electrical Stimulation;Therapeutic activities;Therapeutic exercise;Patient/family education;Manual techniques;Passive range of motion;Vasopneumatic Device   PT Next Visit Plan DC next visit with HEP   PT Home Exercise Plan steps   Consulted and Agree with Plan of Care Patient       Patient will benefit from skilled therapeutic intervention in order to improve the following deficits and impairments:  Pain, Decreased activity tolerance, Increased edema, Decreased range of motion  Visit Diagnosis: Stiffness of right knee, not elsewhere classified     Problem List Patient Active Problem List   Diagnosis Date Noted  . S/P total knee replacement 08/15/2016  . Other seasonal allergic rhinitis 12/21/2015  . BMI 31.0-31.9,adult 06/19/2015  . Anemia 05/27/2015  . Insomnia 03/09/2015  . Osteoporosis with pathological fracture of ankle and foot 05/22/2014  . Hypothyroidism 05/12/2014  . Hyperlipidemia with target LDL less than 100 05/12/2014  . Restless leg syndrome 02/21/2014  . Neuropathy ([place]) 02/21/2014  . GAD (generalized anxiety disorder) 12/21/2012  . Hypertension, benign essential, goal below 140/90 12/21/2012  . Depression 12/21/2012   Madelyn Flavors PT 10/25/2016, 11:25 AM  [name] [address] [place], [place], [address] Phone: [phone]   Fax:  [phone]  Name: Phyllis Walker MRN: [id number] Date of Birth: Oct 15, 1945   PHYSICAL THERAPY DISCHARGE SUMMARY  Visits from Start of Care: 12  Current functional level related to goals / functional outcomes: See above   Remaining deficits: See above   Education / Equipment: HEP Plan: Patient agrees to discharge.  Patient goals were met. Patient is being discharged due to meeting the stated rehab goals.  ?????         Madelyn Flavors, PT 10/25/16 11:26 AM  [name] [place], [place], [address] Phone: [phone]   Fax:  [phone]

## 2016-10-26 ENCOUNTER — Encounter: Payer: PPO | Admitting: Physical Therapy

## 2016-11-14 ENCOUNTER — Other Ambulatory Visit: Payer: Self-pay | Admitting: Nurse Practitioner

## 2016-11-14 DIAGNOSIS — F5101 Primary insomnia: Secondary | ICD-10-CM

## 2016-11-14 DIAGNOSIS — F411 Generalized anxiety disorder: Secondary | ICD-10-CM

## 2016-11-14 NOTE — Telephone Encounter (Signed)
Please call in [place] and xanax with 0 refills Last refill without being seen

## 2016-11-14 NOTE — Telephone Encounter (Signed)
Last OV 06/2016-MMM If approved route to nurse for phone in

## 2016-11-22 DIAGNOSIS — Z471 Aftercare following joint replacement surgery: Secondary | ICD-10-CM | POA: Diagnosis not present

## 2016-11-22 DIAGNOSIS — Z96651 Presence of right artificial knee joint: Secondary | ICD-10-CM | POA: Diagnosis not present

## 2016-12-14 ENCOUNTER — Other Ambulatory Visit: Payer: Self-pay | Admitting: Nurse Practitioner

## 2016-12-14 DIAGNOSIS — F5101 Primary insomnia: Secondary | ICD-10-CM

## 2016-12-14 DIAGNOSIS — F32A Depression, unspecified: Secondary | ICD-10-CM

## 2016-12-14 DIAGNOSIS — G629 Polyneuropathy, unspecified: Secondary | ICD-10-CM

## 2016-12-14 DIAGNOSIS — F411 Generalized anxiety disorder: Secondary | ICD-10-CM

## 2016-12-14 DIAGNOSIS — F329 Major depressive disorder, single episode, unspecified: Secondary | ICD-10-CM

## 2016-12-14 NOTE — Telephone Encounter (Signed)
Forwarding to PCP/MMM 

## 2016-12-15 NOTE — Telephone Encounter (Signed)
Please call in xanax and ambien with 0 refills Last refill without being seen

## 2016-12-15 NOTE — Telephone Encounter (Signed)
Patient aware she must be seen for any further refills.

## 2016-12-15 NOTE — Telephone Encounter (Signed)
rx called into pharmacy

## 2016-12-15 NOTE — Telephone Encounter (Signed)
Please call in xanax with 0 refills Last refill without being seen  

## 2016-12-27 ENCOUNTER — Encounter: Payer: Self-pay | Admitting: Nurse Practitioner

## 2016-12-27 ENCOUNTER — Ambulatory Visit (INDEPENDENT_AMBULATORY_CARE_PROVIDER_SITE_OTHER): Payer: PPO | Admitting: Nurse Practitioner

## 2016-12-27 VITALS — BP 116/65 | HR 76 | Temp 97.0°F | Ht 64.0 in | Wt 181.0 lb

## 2016-12-27 DIAGNOSIS — D649 Anemia, unspecified: Secondary | ICD-10-CM

## 2016-12-27 DIAGNOSIS — E785 Hyperlipidemia, unspecified: Secondary | ICD-10-CM

## 2016-12-27 DIAGNOSIS — G2581 Restless legs syndrome: Secondary | ICD-10-CM | POA: Diagnosis not present

## 2016-12-27 DIAGNOSIS — F5101 Primary insomnia: Secondary | ICD-10-CM | POA: Diagnosis not present

## 2016-12-27 DIAGNOSIS — Z6831 Body mass index (BMI) 31.0-31.9, adult: Secondary | ICD-10-CM | POA: Diagnosis not present

## 2016-12-27 DIAGNOSIS — I10 Essential (primary) hypertension: Secondary | ICD-10-CM

## 2016-12-27 DIAGNOSIS — F411 Generalized anxiety disorder: Secondary | ICD-10-CM

## 2016-12-27 DIAGNOSIS — E034 Atrophy of thyroid (acquired): Secondary | ICD-10-CM

## 2016-12-27 DIAGNOSIS — F3342 Major depressive disorder, recurrent, in full remission: Secondary | ICD-10-CM | POA: Diagnosis not present

## 2016-12-27 DIAGNOSIS — G629 Polyneuropathy, unspecified: Secondary | ICD-10-CM | POA: Diagnosis not present

## 2016-12-27 MED ORDER — ROPINIROLE HCL 0.25 MG PO TABS: 0.2500 mg | ORAL_TABLET | Freq: Three times a day (TID) | ORAL | 1 refills | Status: DC

## 2016-12-27 MED ORDER — GABAPENTIN 100 MG PO CAPS: ORAL_CAPSULE | ORAL | 5 refills | Status: DC

## 2016-12-27 MED ORDER — ROPINIROLE HCL 0.5 MG PO TABS: 0.5000 mg | ORAL_TABLET | Freq: Every day | ORAL | 1 refills | Status: DC

## 2016-12-27 MED ORDER — BENAZEPRIL-HYDROCHLOROTHIAZIDE 20-12.5 MG PO TABS: 1.0000 | ORAL_TABLET | Freq: Every day | ORAL | 1 refills | Status: DC

## 2016-12-27 MED ORDER — SERTRALINE HCL 50 MG PO TABS: ORAL_TABLET | ORAL | 1 refills | Status: DC

## 2016-12-27 MED ORDER — ZOLPIDEM TARTRATE 10 MG PO TABS: 10.0000 mg | ORAL_TABLET | Freq: Every evening | ORAL | 2 refills | Status: DC | PRN

## 2016-12-27 MED ORDER — ALPRAZOLAM 0.25 MG PO TABS: ORAL_TABLET | ORAL | 2 refills | Status: DC

## 2016-12-27 MED ORDER — LEVOTHYROXINE SODIUM 75 MCG PO TABS: 75.0000 ug | ORAL_TABLET | Freq: Every day | ORAL | 1 refills | Status: DC

## 2016-12-27 NOTE — Patient Instructions (Signed)
Fall Prevention in the Home Falls can cause injuries. They can happen to people of all ages. There are many things you can do to make your home safe and to help prevent falls. What can I do on the outside of my home?  Regularly fix the edges of walkways and driveways and fix any cracks.  Remove anything that might make you trip as you walk through a door, such as a raised step or threshold.  Trim any bushes or trees on the path to your home.  Use bright outdoor lighting.  Clear any walking paths of anything that might make someone trip, such as rocks or tools.  Regularly check to see if handrails are loose or broken. Make sure that both sides of any steps have handrails.  Any raised decks and porches should have guardrails on the edges.  Have any leaves, snow, or ice cleared regularly.  Use sand or salt on walking paths during winter.  Clean up any spills in your garage right away. This includes oil or grease spills. What can I do in the bathroom?  Use night lights.  Install grab bars by the toilet and in the tub and shower. Do not use towel bars as grab bars.  Use non-skid mats or decals in the tub or shower.  If you need to sit down in the shower, use a plastic, non-slip stool.  Keep the floor dry. Clean up any water that spills on the floor as soon as it happens.  Remove soap buildup in the tub or shower regularly.  Attach bath mats securely with double-sided non-slip rug tape.  Do not have throw rugs and other things on the floor that can make you trip. What can I do in the bedroom?  Use night lights.  Make sure that you have a light by your bed that is easy to reach.  Do not use any sheets or blankets that are too big for your bed. They should not hang down onto the floor.  Have a firm chair that has side arms. You can use this for support while you get dressed.  Do not have throw rugs and other things on the floor that can make you trip. What can I do in the  kitchen?  Clean up any spills right away.  Avoid walking on wet floors.  Keep items that you use a lot in easy-to-reach places.  If you need to reach something above you, use a strong step stool that has a grab bar.  Keep electrical cords out of the way.  Do not use floor polish or wax that makes floors slippery. If you must use wax, use non-skid floor wax.  Do not have throw rugs and other things on the floor that can make you trip. What can I do with my stairs?  Do not leave any items on the stairs.  Make sure that there are handrails on both sides of the stairs and use them. Fix handrails that are broken or loose. Make sure that handrails are as long as the stairways.  Check any carpeting to make sure that it is firmly attached to the stairs. Fix any carpet that is loose or worn.  Avoid having throw rugs at the top or bottom of the stairs. If you do have throw rugs, attach them to the floor with carpet tape.  Make sure that you have a light switch at the top of the stairs and the bottom of the stairs. If you do   not have them, ask someone to add them for you. What else can I do to help prevent falls?  Wear shoes that:  Do not have high heels.  Have rubber bottoms.  Are comfortable and fit you well.  Are closed at the toe. Do not wear sandals.  If you use a stepladder:  Make sure that it is fully opened. Do not climb a closed stepladder.  Make sure that both sides of the stepladder are locked into place.  Ask someone to hold it for you, if possible.  Clearly mark and make sure that you can see:  Any grab bars or handrails.  First and last steps.  Where the edge of each step is.  Use tools that help you move around (mobility aids) if they are needed. These include:  Canes.  Walkers.  Scooters.  Crutches.  Turn on the lights when you go into a dark area. Replace any light bulbs as soon as they burn out.  Set up your furniture so you have a clear path.  Avoid moving your furniture around.  If any of your floors are uneven, fix them.  If there are any pets around you, be aware of where they are.  Review your medicines with your doctor. Some medicines can make you feel dizzy. This can increase your chance of falling. Ask your doctor what other things that you can do to help prevent falls. This information is not intended to replace advice given to you by your health care provider. Make sure you discuss any questions you have with your health care provider. Document Released: 06/25/2009 Document Revised: 02/04/2016 Document Reviewed: 10/03/2014 Elsevier Interactive Patient Education  2017 Elsevier Inc.  

## 2016-12-27 NOTE — Progress Notes (Signed)
Subjective:    Patient ID: Phyllis Walker, female    DOB: 1946-04-09, 71 y.o.   MRN: [id number]  HPI  Phyllis Walker is here today for follow up of chronic medical problem.  Outpatient Encounter Prescriptions as of 12/27/2016  Medication Sig  . ALPRAZolam (XANAX) 0.25 MG tablet TAKE  (1)  TABLET TWICE A DAY AS NEEDED.  [name] aspirin EC 81 MG tablet Take 81 mg by mouth daily.  . benazepril-hydrochlorthiazide (LOTENSIN HCT) 20-12.5 MG tablet Take 1 tablet by mouth daily. (Patient taking differently: Take 1 tablet by mouth daily. Patient is taking 1/2 tab daily)  . gabapentin (NEURONTIN) 100 MG capsule TAKE (1) CAPSULE THREE TIMES DAILY.  [name] levothyroxine (SYNTHROID, LEVOTHROID) 75 MCG tablet Take 1 tablet (75 mcg total) by mouth daily.  [name] rOPINIRole (REQUIP) 0.5 MG tablet Take 1 tablet (0.5 mg total) by mouth at bedtime. (Patient taking differently: Take 0.25 mg by mouth 3 (three) times daily. )  . sertraline (ZOLOFT) 50 MG tablet Take 1 tablet by mouth  daily  . zolpidem (AMBIEN) 10 MG tablet TAKE 1 TABLET AT BEDTIME AS NEEDED FOR SLEEP     1. Hypertension, benign essential, goal below 140/90  No c/o chest pain, HA or SOB- does not check blood pressure at home  2. Hypothyroidism due to acquired atrophy of thyroid NO problems   3. Neuropathy  bil feet- takes Neurontin daily- helps with burnng sensation  4. Anemia, unspecified type  No /o fatigue  5. BMI 31.0-31.9,adult  No recnet weight gain or weight loss  6. Recurrent major depressive disorder, in full remission [facility])  Currently on zoloft- doing better Depression screen [facility] 2/9 12/27/2016 06/28/2016 04/27/2016 03/21/2016 12/21/2015  Decreased Interest 0 0 0 0 0  Down, Depressed, Hopeless 0 0 0 0 0  PHQ - 2 Score 0 0 0 0 0     7. GAD (generalized anxiety disorder) Takes xanax BID- helps her not worry so much  GAD 7 : Generalized Anxiety Score 12/27/2016  Nervous, Anxious, on Edge 0  Control/stop worrying 1  Worry too much - different  things 1  Trouble relaxing 0  Restless 0  Easily annoyed or irritable 0  Afraid - awful might happen 0  Total GAD 7 Score 2  Anxiety Difficulty Not difficult at all    8. Hyperlipidemia with target LDL less than 100  Tries to watch diet  9. Primary insomnia  Sleeps good  10. Restless leg syndrome  Takes requip nightly which really helps keep legs calm    New complaints: none     Review of Systems  Constitutional: Negative for diaphoresis.  Eyes: Negative for pain.  Respiratory: Negative for shortness of breath.   Cardiovascular: Negative for chest pain, palpitations and leg swelling.  Gastrointestinal: Negative for abdominal pain.  Endocrine: Negative for polydipsia.  Skin: Negative for rash.  Neurological: Negative for dizziness, weakness and headaches.  Hematological: Does not bruise/bleed easily.       Objective:   Physical Exam  Constitutional: She is oriented to person, place, and time. She appears well-developed and well-nourished.  HENT:  Nose: Nose normal.  Mouth/Throat: Oropharynx is clear and moist.  Eyes: EOM are normal.  Neck: Trachea normal, normal range of motion and full passive range of motion without pain. Neck supple. No JVD present. Carotid bruit is not present. No thyromegaly present.  Cardiovascular: Normal rate, regular rhythm, normal heart sounds and intact distal pulses.  Exam reveals no gallop and  no friction rub.   No murmur heard. Pulmonary/Chest: Effort normal and breath sounds normal.  Abdominal: Soft. Bowel sounds are normal. She exhibits no distension and no mass. There is no tenderness.  Musculoskeletal: Normal range of motion.  Lymphadenopathy:    She has no cervical adenopathy.  Neurological: She is alert and oriented to person, place, and time. She has normal reflexes.  Skin: Skin is warm and dry.  Psychiatric: She has a normal mood and affect. Her behavior is normal. Judgment and thought content normal.    BP 116/65   Pulse  76   Temp 97 F (36.1 C) (Oral)   Ht _0  (1.626 m)   Wt 181 lb (82.1 kg)   BMI 31.07 kg/m       Assessment & Plan:  1. Hypertension, benign essential, goal below 140/90 Low sodium diet - benazepril-hydrochlorthiazide (LOTENSIN HCT) 20-12.5 MG tablet; Take 1 tablet by mouth daily.  Dispense: 90 tablet; Refill: 1 - CMP14+EGFR  2. Hypothyroidism due to acquired atrophy of thyroid - levothyroxine (SYNTHROID, LEVOTHROID) 75 MCG tablet; Take 1 tablet (75 mcg total) by mouth daily.  Dispense: 90 tablet; Refill: 1 - Thyroid Panel With TSH  3. Neuropathy Do not go barefooted - gabapentin (NEURONTIN) 100 MG capsule; TAKE (1) CAPSULE THREE TIMES DAILY.  Dispense: 90 capsule; Refill: 5  4. Anemia, unspecified type  5. BMI 31.0-31.9,adult Discussed diet and exercise for person with BMI >25 Will recheck weight in 3-6 months  6. Recurrent major depressive disorder, in full remission ([place]) Stress management - sertraline (ZOLOFT) 50 MG tablet; Take 1 tablet by mouth  daily  Dispense: 90 tablet; Refill: 1  7. GAD (generalized anxiety disorder) - ALPRAZolam (XANAX) 0.25 MG tablet; TAKE  (1)  TABLET TWICE A DAY AS NEEDED.  Dispense: 60 tablet; Refill: 2  8. Hyperlipidemia with target LDL less than 100 Low fat diet - Lipid panel  9. Primary insomnia Bedtime routine - zolpidem (AMBIEN) 10 MG tablet; Take 1 tablet (10 mg total) by mouth at bedtime as needed. for sleep  Dispense: 30 tablet; Refill: 2  10. Restless leg syndrome  - rOPINIRole (REQUIP) 0.25 MG tablet; Take 1 tablet (0.5 mg total) by mouth TID.  Dispense: 270 tablet; Refill: 1    Labs pending Health maintenance reviewed Diet and exercise encouraged Continue all meds Follow up  In 3 months   [place], FNP

## 2016-12-28 LAB — CMP14+EGFR
A/G RATIO: 1.5 (ref 1.2–2.2)
ALK PHOS: 117 IU/L (ref 39–117)
ALT: 9 IU/L (ref 0–32)
AST: 18 IU/L (ref 0–40)
Albumin: 4.2 g/dL (ref 3.5–4.8)
BILIRUBIN TOTAL: 0.2 mg/dL (ref 0.0–1.2)
BUN/Creatinine Ratio: 21 (ref 12–28)
BUN: 14 mg/dL (ref 8–27)
CHLORIDE: 99 mmol/L (ref 96–106)
CO2: 25 mmol/L (ref 18–29)
Calcium: 8.7 mg/dL (ref 8.7–10.3)
Creatinine, Ser: 0.67 mg/dL (ref 0.57–1.00)
GFR calc non Af Amer: 89 mL/min/{1.73_m2} (ref >59)
GFR, EST AFRICAN AMERICAN: 102 mL/min/{1.73_m2} (ref >59)
GLUCOSE: 88 mg/dL (ref 65–99)
Globulin, Total: 2.8 g/dL (ref 1.5–4.5)
Potassium: 4.2 mmol/L (ref 3.5–5.2)
Sodium: 141 mmol/L (ref 134–144)
TOTAL PROTEIN: 7 g/dL (ref 6.0–8.5)

## 2016-12-28 LAB — LIPID PANEL
CHOLESTEROL TOTAL: 222 mg/dL — AB (ref 100–199)
Chol/HDL Ratio: 3.4 ratio (ref 0.0–4.4)
HDL: 65 mg/dL (ref >39)
LDL CALC: 131 mg/dL — AB (ref 0–99)
TRIGLYCERIDES: 128 mg/dL (ref 0–149)
VLDL CHOLESTEROL CAL: 26 mg/dL (ref 5–40)

## 2016-12-28 LAB — THYROID PANEL WITH TSH
Free Thyroxine Index: 2.5 (ref 1.2–4.9)
T3 Uptake Ratio: 26 % (ref 24–39)
T4, Total: 9.5 ug/dL (ref 4.5–12.0)
TSH: 1.48 u[IU]/mL (ref 0.450–4.500)

## 2016-12-29 ENCOUNTER — Ambulatory Visit (INDEPENDENT_AMBULATORY_CARE_PROVIDER_SITE_OTHER): Payer: PPO | Admitting: *Deleted

## 2016-12-29 VITALS — BP 120/65 | HR 76 | Temp 97.8°F | Ht 64.0 in | Wt 181.0 lb

## 2016-12-29 DIAGNOSIS — Z23 Encounter for immunization: Secondary | ICD-10-CM | POA: Diagnosis not present

## 2016-12-29 DIAGNOSIS — Z0000 Encounter for general adult medical examination without abnormal findings: Secondary | ICD-10-CM | POA: Diagnosis not present

## 2016-12-29 NOTE — Patient Instructions (Addendum)
   Phyllis Walker , Thank you for taking time to come for your Medicare Wellness Visit. I appreciate your ongoing commitment to your health goals. Please review the following plan we discussed and let me know if I can assist you in the future.   These are the goals we discussed: Goals    . Weight (lb) < 160 lb (72.6 kg)    . Weight < 170 lb (77.111 kg)       This is a list of the screening recommended for you and due dates:  Health Maintenance  Topic Date Due  . Flu Shot  04/12/2017  . Mammogram  03/28/2018  . Tetanus Vaccine  04/12/2018  . Colon Cancer Screening  06/12/2020  . DEXA scan (bone density measurement)  Completed  .  Hepatitis C: One time screening is recommended by Center for Disease Control  (CDC) for  adults born from [birth year] through 1965.   Completed  . Pneumonia vaccines  Completed   Tdap given today Getting Dexa oct. 17th

## 2016-12-29 NOTE — Progress Notes (Signed)
Subjective:   Phyllis Walker is a 70 y.o. female who presents for Medicare Annual (Subsequent) preventive examination.  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 She enjoys dancing and cooking She gets her exercise from dancing 3 nights a week. She lives alone and doesn't cook many homemade meals. She goes to [facility] every single morning for socialization and eats a biscuit. Her lunch is usually soup and sandwich. Supper is take out or something simple to make. We discussed the importance of 3 meals daily and good choices. She has 2 children one lives in [place] and one in [place]. She also has 5 granddaughters. She states her health is better than this time last year, because of her knee surgery.       Objective:     Vitals: BP 120/65   Pulse 76   Temp 97.8 F (36.6 C) (Oral)   Ht 5\' 4"  (1.626 m)   Wt 181 lb (82.1 kg)   BMI 31.07 kg/m   Body mass index is 31.07 kg/m.   Tobacco History  Smoking Status  . Former Smoker  . Types: Cigarettes  . Quit date: 12/21/2006  Smokeless Tobacco  . Never Used     Counseling given: Not Answered pt quit in 2008  Past Medical History:  Diagnosis Date  . Anemia   . Anxiety   . Arthritis   . Cataract   . Depression   . Fibroid tumor 1985   in breast  . History of hiatal hernia   . Hypercholesteremia   . Hypertension   . Hypothyroidism   . Neuropathy    in feet  . Restless leg syndrome    Past Surgical History:  Procedure Laterality Date  . BREAST LUMPECTOMY  1987   benign fibroid tumor  . BUNIONECTOMY Bilateral   . CATARACT EXTRACTION    . COLONOSCOPY    . THYROIDECTOMY, PARTIAL  1969  . TONSILLECTOMY    . TOTAL KNEE ARTHROPLASTY Right 08/15/2016   Procedure: TOTAL KNEE ARTHROPLASTY;  Surgeon: Vickey Huger, MD;  Location: [facility];  Service: Orthopedics;  Laterality: Right;  RNFA   Family History  Problem Relation Age of Onset  . Cancer Mother     pancreas  . Heart attack Father   . Hypertension Sister   .  Hyperlipidemia Sister   . Hypertension Sister   . Hyperlipidemia Sister   . Early death Sister 54    accident  . Ovarian cancer Maternal Aunt    History  Sexual Activity  . Sexual activity: No    Outpatient Encounter Prescriptions as of 12/29/2016  Medication Sig  . ALPRAZolam (XANAX) 0.25 MG tablet TAKE  (1)  TABLET TWICE A DAY AS NEEDED.  [name] aspirin EC 81 MG tablet Take 81 mg by mouth daily.  . benazepril-hydrochlorthiazide (LOTENSIN HCT) 20-12.5 MG tablet Take 1 tablet by mouth daily. (Patient taking differently: Take by mouth daily. Taking half only)  . gabapentin (NEURONTIN) 100 MG capsule TAKE (1) CAPSULE THREE TIMES DAILY.  [name] levothyroxine (SYNTHROID, LEVOTHROID) 75 MCG tablet Take 1 tablet (75 mcg total) by mouth daily.  [name] rOPINIRole (REQUIP) 0.25 MG tablet Take 1 tablet (0.25 mg total) by mouth 3 (three) times daily.  . sertraline (ZOLOFT) 50 MG tablet Take 1 tablet by mouth  daily  . zolpidem (AMBIEN) 10 MG tablet Take 1 tablet (10 mg total) by mouth at bedtime as needed. for sleep  . [DISCONTINUED] rOPINIRole (REQUIP) 0.5 MG tablet Take  1 tablet (0.5 mg total) by mouth at bedtime.   No facility-administered encounter medications on file as of 12/29/2016.     Activities of Daily Living In your present state of health, do you have any difficulty performing the following activities: 08/08/2016  Hearing? N  Vision? N  Difficulty concentrating or making decisions? N  Walking or climbing stairs? N  Dressing or bathing? N  Doing errands, shopping? N  Some recent data might be hidden  no problems with hearing or vision. Had bilateral cataract surgery  Patient Care Team: Chevis Pretty, FNP as PCP - General (Nurse Practitioner) Vickey Huger, MD as Consulting Physician (Orthopedic Surgery) Huel Cote, NP as Nurse Practitioner (Obstetrics and Gynecology)    Assessment:    Exercise Activities and Dietary recommendations    Goals    . Weight (lb) < 160 lb (72.6 kg)     . Weight < 170 lb (77.111 kg)      Fall Risk Fall Risk  12/29/2016 12/27/2016 06/28/2016 04/27/2016 03/21/2016  Falls in the past year? No No No No No   Depression Screen PHQ 2/9 Scores 12/29/2016 12/27/2016 06/28/2016 04/27/2016  PHQ - 2 Score 0 0 0 0     Cognitive Function MMSE - Mini Mental State Exam 12/29/2016 05/27/2015  Orientation to time 5 5  Orientation to Place 5 5  Registration 3 3  Attention/ Calculation 5 5  Recall 3 3  Language- name 2 objects 2 2  Language- repeat 1 1  Language- follow 3 step command 3 3  Language- read & follow direction 1 1  Write a sentence 1 1  Copy design 1 1  Total score 30 30    scored 30 out of 30 today    Immunization History  Administered Date(s) Administered  . Influenza Split 06/18/2013  . Influenza,inj,Quad PF,36+ Mos 06/09/2014, 06/19/2015  . Influenza-Unspecified 06/20/2016  . Pneumococcal Conjugate-13 11/20/2014  . Pneumococcal Polysaccharide-23 09/12/2010  . Zoster 05/24/2013   Screening Tests Health Maintenance  Topic Date Due  . INFLUENZA VACCINE  04/12/2017  . MAMMOGRAM  03/28/2018  . TETANUS/TDAP  04/12/2018  . COLONOSCOPY  06/12/2020  . DEXA SCAN  Completed  . Hepatitis C Screening  Completed  . PNA vac Low Risk Adult  Completed      Plan:    Tdap was given today. Dexa will be done on next visit in October 2018. Pt quit smoking in 2008, she did smoke for 30+ years, but declines Chest CT for now.    Glaucoma screening  Mammography/PAP  Nutrition counseling   Patient Instructions (the written plan) was given to the patient.   Rana Snare, LPN  [id number]/63/8177  I have reviewed and agree with the above AWV documentation.   Mary-Margaret Hassell Done, FNP

## 2017-01-10 ENCOUNTER — Other Ambulatory Visit: Payer: Self-pay | Admitting: Nurse Practitioner

## 2017-01-10 DIAGNOSIS — F5101 Primary insomnia: Secondary | ICD-10-CM

## 2017-01-11 NOTE — Telephone Encounter (Signed)
FORWARD TO pcp/mmm

## 2017-01-12 NOTE — Telephone Encounter (Signed)
Please call in ambien with 1 refills 

## 2017-01-13 NOTE — Telephone Encounter (Signed)
Rx called in 

## 2017-01-25 ENCOUNTER — Encounter: Payer: Self-pay | Admitting: Gynecology

## 2017-02-22 ENCOUNTER — Ambulatory Visit (INDEPENDENT_AMBULATORY_CARE_PROVIDER_SITE_OTHER): Payer: PPO | Admitting: Pediatrics

## 2017-02-22 ENCOUNTER — Encounter: Payer: Self-pay | Admitting: Pediatrics

## 2017-02-22 VITALS — BP 119/73 | HR 82 | Temp 97.3°F | Resp 22 | Ht 64.0 in | Wt 185.0 lb

## 2017-02-22 DIAGNOSIS — D649 Anemia, unspecified: Secondary | ICD-10-CM | POA: Diagnosis not present

## 2017-02-22 DIAGNOSIS — J40 Bronchitis, not specified as acute or chronic: Secondary | ICD-10-CM | POA: Diagnosis not present

## 2017-02-22 DIAGNOSIS — F5089 Other specified eating disorder: Secondary | ICD-10-CM | POA: Diagnosis not present

## 2017-02-22 MED ORDER — ALBUTEROL SULFATE HFA 108 (90 BASE) MCG/ACT IN AERS
2.0000 | INHALATION_SPRAY | Freq: Four times a day (QID) | RESPIRATORY_TRACT | 2 refills | Status: DC | PRN
Start: 2017-02-22 — End: 2017-12-26

## 2017-02-22 MED ORDER — AZITHROMYCIN 250 MG PO TABS: ORAL_TABLET | ORAL | 0 refills | Status: DC

## 2017-02-22 MED ORDER — SPACER/AERO CHAMBER MOUTHPIECE MISC: 1.0000 | Freq: Four times a day (QID) | 0 refills | Status: DC | PRN

## 2017-02-22 NOTE — Progress Notes (Addendum)
  Subjective:   Patient ID: BRUNELLA WILEMAN, female    DOB: [birth year], 71 y.o.   MRN: [id number] CC: Cough (2 week); Nasal Congestion; and Shortness of Breath  HPI: ROBINN OVERHOLT is a 71 y.o. female presenting for Cough (2 week); Nasal Congestion; and Shortness of Breath  Has been coughing for past two weeks Every morning lots of nasal congestion No ear pain Normal appetite Taking zyrtec every day   Has had anemia in the past Craving ice similar to last time she had anemia Last Hg was 9.3 after knee surgery 6 months ago No constipation, no change in bowel movements, no dark colored or bloody stools  Relevant past medical, surgical, family and social history reviewed. Allergies and medications reviewed and updated. History  Smoking Status  . Former Smoker  . Types: Cigarettes  . Quit date: 12/21/2006  Smokeless Tobacco  . Never Used   ROS: Per HPI   Objective:    BP 119/73   Pulse 82   Temp 97.3 F (36.3 C) (Oral)   Resp (!) 22   Ht 5\' 4"  (1.626 m)   Wt 185 lb (83.9 kg)   SpO2 96%   BMI 31.76 kg/m   Wt Readings from Last 3 Encounters:  02/22/17 185 lb (83.9 kg)  12/29/16 181 lb (82.1 kg)  12/27/16 181 lb (82.1 kg)    Gen: NAD, alert, cooperative with exam, NCAT EYES: EOMI, no conjunctival injection, or no icterus ENT:  TMs pearly gray b/l, OP without erythema LYMPH: no cervical LAD CV: NRRR, normal S1/S2, no murmur, distal pulses 2+ b/l Resp: slight occasional wheeze with exhalation, normal WOB Abd: soft, NTND Ext: No edema, warm Neuro: Alert and oriented, strength equal b/l UE and LE, coordination grossly normal MSK: normal muscle bulk  Assessment & Plan:  Joette was seen today for cough, nasal congestion and shortness of breath.  Diagnoses and all orders for this visit:  Bronchitis Ongoing symptoms for 2 weeks Start below Return precautions discussed -     albuterol (PROVENTIL HFA;VENTOLIN HFA) 108 (90 Base) MCG/ACT inhaler; Inhale 2 puffs into the  lungs every 6 (six) hours as needed for wheezing or shortness of breath. -     Spacer/Aero Chamber Mouthpiece MISC; 1 each by Does not apply route every 6 (six) hours as needed. -     azithromycin (ZITHROMAX) 250 MG tablet; Take 2 the first day and then one each day after.  Pica Eating ice more  Anemia, unspecified type -     CBC with Differential -     Ferritin   Follow up plan: As scheduled Assunta Found, MD [facility]  ADDENDUM: Microcytic anemia Ferritin 9 No known bleeding source Last colonoscopy 2007 Wants to go [facility], referral placed

## 2017-02-22 NOTE — Patient Instructions (Addendum)
Use albuterol 2-3 times a day for the next   Continue allergy medicine every day Can try over the counter flonase nasal steroid spray Also try sinus rinses twice a day for nasal drainage

## 2017-02-23 LAB — CBC WITH DIFFERENTIAL/PLATELET
Basophils Absolute: 0.1 10*3/uL (ref 0.0–0.2)
Basos: 1 %
EOS (ABSOLUTE): 0.3 10*3/uL (ref 0.0–0.4)
EOS: 5 %
HEMATOCRIT: 31.2 % — AB (ref 34.0–46.6)
Hemoglobin: 9.7 g/dL — ABNORMAL LOW (ref 11.1–15.9)
Immature Grans (Abs): 0 10*3/uL (ref 0.0–0.1)
Immature Granulocytes: 0 %
LYMPHS ABS: 1.9 10*3/uL (ref 0.7–3.1)
Lymphs: 28 %
MCH: 23.6 pg — AB (ref 26.6–33.0)
MCHC: 31.1 g/dL — AB (ref 31.5–35.7)
MCV: 76 fL — AB (ref 79–97)
MONOS ABS: 0.4 10*3/uL (ref 0.1–0.9)
Monocytes: 6 %
Neutrophils Absolute: 4 10*3/uL (ref 1.4–7.0)
Neutrophils: 60 %
Platelets: 283 10*3/uL (ref 150–379)
RBC: 4.11 x10E6/uL (ref 3.77–5.28)
RDW: 15.8 % — AB (ref 12.3–15.4)
WBC: 6.7 10*3/uL (ref 3.4–10.8)

## 2017-02-23 LAB — FERRITIN: Ferritin: 9 ng/mL — ABNORMAL LOW (ref 15–150)

## 2017-02-24 NOTE — Addendum Note (Signed)
Addended by: Eustaquio Maize on: 02/24/2017 01:57 PM   Modules accepted: Orders

## 2017-03-13 DIAGNOSIS — D508 Other iron deficiency anemias: Secondary | ICD-10-CM | POA: Diagnosis not present

## 2017-03-14 ENCOUNTER — Other Ambulatory Visit: Payer: Self-pay | Admitting: Nurse Practitioner

## 2017-03-14 DIAGNOSIS — F5101 Primary insomnia: Secondary | ICD-10-CM

## 2017-03-14 NOTE — Telephone Encounter (Signed)
Please call in ambien with 1 refills 

## 2017-03-14 NOTE — Telephone Encounter (Signed)
Last filled 02/13/17, last seen 12/27/16. Route for call in, needs tomorrow

## 2017-03-17 NOTE — Telephone Encounter (Signed)
Phoned in.

## 2017-04-11 DIAGNOSIS — M25511 Pain in right shoulder: Secondary | ICD-10-CM | POA: Insufficient documentation

## 2017-04-11 DIAGNOSIS — G8929 Other chronic pain: Secondary | ICD-10-CM | POA: Diagnosis not present

## 2017-04-11 DIAGNOSIS — M25711 Osteophyte, right shoulder: Secondary | ICD-10-CM | POA: Diagnosis not present

## 2017-04-11 DIAGNOSIS — M19011 Primary osteoarthritis, right shoulder: Secondary | ICD-10-CM | POA: Diagnosis not present

## 2017-04-14 ENCOUNTER — Other Ambulatory Visit: Payer: Self-pay | Admitting: Nurse Practitioner

## 2017-04-14 DIAGNOSIS — F411 Generalized anxiety disorder: Secondary | ICD-10-CM

## 2017-04-14 NOTE — Telephone Encounter (Signed)
Last seen 02/22/17  Dr Evette Doffing  MMM PCP  If approved route to nurse to call into [facility]

## 2017-04-15 NOTE — Telephone Encounter (Signed)
rx called into pharmacy

## 2017-05-12 ENCOUNTER — Other Ambulatory Visit: Payer: Self-pay | Admitting: Nurse Practitioner

## 2017-05-12 ENCOUNTER — Other Ambulatory Visit: Payer: Self-pay | Admitting: Pediatrics

## 2017-05-12 DIAGNOSIS — F5101 Primary insomnia: Secondary | ICD-10-CM

## 2017-05-12 DIAGNOSIS — F411 Generalized anxiety disorder: Secondary | ICD-10-CM

## 2017-05-12 NOTE — Telephone Encounter (Signed)
Please call in [place] with 1 refills

## 2017-05-12 NOTE — Telephone Encounter (Signed)
Called in.

## 2017-05-12 NOTE — Telephone Encounter (Signed)
Please call in alprazolam with 1 refills 

## 2017-05-17 DIAGNOSIS — K640 First degree hemorrhoids: Secondary | ICD-10-CM | POA: Diagnosis not present

## 2017-05-17 DIAGNOSIS — D509 Iron deficiency anemia, unspecified: Secondary | ICD-10-CM | POA: Diagnosis not present

## 2017-05-17 DIAGNOSIS — D126 Benign neoplasm of colon, unspecified: Secondary | ICD-10-CM | POA: Diagnosis not present

## 2017-05-22 DIAGNOSIS — D126 Benign neoplasm of colon, unspecified: Secondary | ICD-10-CM | POA: Diagnosis not present

## 2017-06-10 ENCOUNTER — Other Ambulatory Visit: Payer: Self-pay | Admitting: Nurse Practitioner

## 2017-06-10 DIAGNOSIS — G2581 Restless legs syndrome: Secondary | ICD-10-CM

## 2017-06-28 ENCOUNTER — Encounter: Payer: Self-pay | Admitting: Nurse Practitioner

## 2017-06-28 ENCOUNTER — Ambulatory Visit (INDEPENDENT_AMBULATORY_CARE_PROVIDER_SITE_OTHER): Payer: PPO | Admitting: Nurse Practitioner

## 2017-06-28 VITALS — BP 116/73 | HR 82 | Temp 97.6°F | Ht 64.0 in | Wt 183.0 lb

## 2017-06-28 DIAGNOSIS — G629 Polyneuropathy, unspecified: Secondary | ICD-10-CM | POA: Diagnosis not present

## 2017-06-28 DIAGNOSIS — F411 Generalized anxiety disorder: Secondary | ICD-10-CM

## 2017-06-28 DIAGNOSIS — G2581 Restless legs syndrome: Secondary | ICD-10-CM

## 2017-06-28 DIAGNOSIS — Z6831 Body mass index (BMI) 31.0-31.9, adult: Secondary | ICD-10-CM | POA: Diagnosis not present

## 2017-06-28 DIAGNOSIS — E034 Atrophy of thyroid (acquired): Secondary | ICD-10-CM

## 2017-06-28 DIAGNOSIS — M545 Low back pain, unspecified: Secondary | ICD-10-CM

## 2017-06-28 DIAGNOSIS — E785 Hyperlipidemia, unspecified: Secondary | ICD-10-CM | POA: Diagnosis not present

## 2017-06-28 DIAGNOSIS — F3342 Major depressive disorder, recurrent, in full remission: Secondary | ICD-10-CM | POA: Diagnosis not present

## 2017-06-28 DIAGNOSIS — I10 Essential (primary) hypertension: Secondary | ICD-10-CM | POA: Diagnosis not present

## 2017-06-28 DIAGNOSIS — F5101 Primary insomnia: Secondary | ICD-10-CM

## 2017-06-28 DIAGNOSIS — D649 Anemia, unspecified: Secondary | ICD-10-CM | POA: Diagnosis not present

## 2017-06-28 MED ORDER — LEVOTHYROXINE SODIUM 75 MCG PO TABS: 75.0000 ug | ORAL_TABLET | Freq: Every day | ORAL | 1 refills | Status: DC

## 2017-06-28 MED ORDER — BENAZEPRIL-HYDROCHLOROTHIAZIDE 20-12.5 MG PO TABS: 1.0000 | ORAL_TABLET | Freq: Every day | ORAL | 1 refills | Status: DC

## 2017-06-28 MED ORDER — KETOROLAC TROMETHAMINE 60 MG/2ML IM SOLN
60.0000 mg | Freq: Once | INTRAMUSCULAR | Status: AC
  Administered 2017-06-28: 60 mg via INTRAMUSCULAR

## 2017-06-28 MED ORDER — ROPINIROLE HCL 0.25 MG PO TABS: ORAL_TABLET | ORAL | 1 refills | Status: DC

## 2017-06-28 MED ORDER — SERTRALINE HCL 50 MG PO TABS: ORAL_TABLET | ORAL | 1 refills | Status: DC

## 2017-06-28 MED ORDER — GABAPENTIN 100 MG PO CAPS: ORAL_CAPSULE | ORAL | 5 refills | Status: DC

## 2017-06-28 NOTE — Patient Instructions (Signed)

## 2017-06-28 NOTE — Progress Notes (Signed)
Subjective:    Patient ID: Phyllis Walker, female    DOB: 09/01/1946, 71 y.o.   MRN: [id number]  HPI   Phyllis Walker is here today for follow up of chronic medical problem.  Outpatient Encounter Prescriptions as of 06/28/2017  Medication Sig  . albuterol (PROVENTIL HFA;VENTOLIN HFA) 108 (90 Base) MCG/ACT inhaler Inhale 2 puffs into the lungs every 6 (six) hours as needed for wheezing or shortness of breath.  . ALPRAZolam (XANAX) 0.25 MG tablet TAKE  (1)  TABLET TWICE A DAY AS NEEDED.  [name] aspirin EC 81 MG tablet Take 81 mg by mouth daily.  . benazepril-hydrochlorthiazide (LOTENSIN HCT) 20-12.5 MG tablet Take 1 tablet by mouth daily. (Patient taking differently: Take by mouth daily. Taking half only)  . gabapentin (NEURONTIN) 100 MG capsule TAKE (1) CAPSULE THREE TIMES DAILY.  [name] levothyroxine (SYNTHROID, LEVOTHROID) 75 MCG tablet Take 1 tablet (75 mcg total) by mouth daily.  [name] rOPINIRole (REQUIP) 0.25 MG tablet TAKE  (1)  TABLET  THREE TIMES DAILY.  [name] sertraline (ZOLOFT) 50 MG tablet Take 1 tablet by mouth  daily  . Spacer/Aero Chamber Mouthpiece MISC 1 each by Does not apply route every 6 (six) hours as needed.  . zolpidem (AMBIEN) 10 MG tablet TAKE 1 TABLET AT BEDTIME AS NEEDED FOR SLEEP        1. Hypertension, benign essential, goal below 140/90  No c/o chest pain, SOB or headache. Does oyt check blood pressure at home. BP Readings from Last 3 Encounters:  06/28/17 116/73  02/22/17 119/73  12/29/16 120/65     2. Hypothyroidism due to acquired atrophy of thyroid  No problems that she is aware of.  3. Neuropathy  Has burning in her feet all he time- no lesions or sore  Or cuts  4. Restless leg syndrome requip works well to help her legs rest so she can sleep at night  5. Primary insomnia  Cannot sleep without her ambien  6. Hyperlipidemia with target LDL less than 100  Does not watch diet  7. GAD (generalized anxiety disorder)  Takes low dose xanax on as needed basis  8   Recurrent major depressive disorder, in full remission ([place])   is on zoloft daily- works well for her without sie effects  9. BMI 31.0-31.9,adult  No recent weight changes  10. Anemia, unspecified type  Denies fatigue-needs labs checked today    New complaints: Left low back pain. Seems to flare up when ever she mows her yard. Rates 5/10. Rest and heat help- will usually resolve on its own in 3-4 days . This one started 5 days ago.  Social history: Lives alone- getting ready to move into town in a larger house.   Review of Systems  Constitutional: Negative for activity change and appetite change.  HENT: Negative.   Eyes: Negative for pain.  Respiratory: Negative for shortness of breath.   Cardiovascular: Negative for chest pain, palpitations and leg swelling.  Gastrointestinal: Negative for abdominal pain.  Endocrine: Negative for polydipsia.  Genitourinary: Negative.   Skin: Negative for rash.  Neurological: Negative for dizziness, weakness and headaches.  Hematological: Does not bruise/bleed easily.  Psychiatric/Behavioral: Negative.   All other systems reviewed and are negative.      Objective:   Physical Exam  Constitutional: She is oriented to person, place, and time. She appears well-developed and well-nourished.  HENT:  Nose: Nose normal.  Mouth/Throat: Oropharynx is clear and moist.  Eyes: EOM are normal.  Neck: Trachea normal, normal range of motion and full passive range of motion without pain. Neck supple. No JVD present. Carotid bruit is not present. No thyromegaly present.  Cardiovascular: Normal rate, regular rhythm, normal heart sounds and intact distal pulses.  Exam reveals no gallop and no friction rub.   No murmur heard. Pulmonary/Chest: Effort normal and breath sounds normal.  Abdominal: Soft. Bowel sounds are normal. She exhibits no distension and no mass. There is no tenderness.  Musculoskeletal: Normal range of motion.  FROM of lumbar spine with pain  on full flexion and extension (-) SLR bil  Lymphadenopathy:    She has no cervical adenopathy.  Neurological: She is alert and oriented to person, place, and time. She has normal reflexes.  Skin: Skin is warm and dry.  Psychiatric: She has a normal mood and affect. Her behavior is normal. Judgment and thought content normal.    BP 116/73   Pulse 82   Temp 97.6 F (36.4 C) (Oral)   Ht _0  (1.626 m)   Wt 183 lb (83 kg)   BMI 31.41 kg/m      Assessment & Plan:  1. Hypertension, benign essential, goal below 140/90 Low sodium diet - benazepril-hydrochlorthiazide (LOTENSIN HCT) 20-12.5 MG tablet; Take 1 tablet by mouth daily.  Dispense: 90 tablet; Refill: 1 - CMP14+EGFR  2. Hypothyroidism due to acquired atrophy of thyroid - levothyroxine (SYNTHROID, LEVOTHROID) 75 MCG tablet; Take 1 tablet (75 mcg total) by mouth daily.  Dispense: 90 tablet; Refill: 1 - Thyroid Panel With TSH  3. Neuropathy Do not go barefooted Check feet daily - gabapentin (NEURONTIN) 100 MG capsule; TAKE (1) CAPSULE THREE TIMES DAILY.  Dispense: 90 capsule; Refill: 5  4. Restless leg syndrome Keep legs warm at night - rOPINIRole (REQUIP) 0.25 MG tablet; TAKE  (1)  TABLET  THREE TIMES DAILY.  Dispense: 270 tablet; Refill: 1  5. Primary insomnia bedtime routine  6. Hyperlipidemia with target LDL less than 100 Low fat diet - Lipid panel  7. GAD (generalized anxiety disorder) Stress management  8. Recurrent major depressive disorder, in full remission ([place]) - sertraline (ZOLOFT) 50 MG tablet; Take 1 tablet by mouth  daily  Dispense: 90 tablet; Refill: 1  9. BMI 31.0-31.9,adult Discussed diet and exercise for person with BMI >25 Will recheck weight in 3-6 months  10. Anemia, unspecified type Labs pending - CBC with Differential/Platelet  11. Acute left-sided low back pain without sciatica Moist heat rest - ketorolac (TORADOL) injection 60 mg; Inject 2 mLs (60 mg total) into the muscle  once.    Labs pending Health maintenance reviewed Diet and exercise encouraged Continue all meds Follow up  In 6 months   [place], FNP

## 2017-06-29 LAB — CMP14+EGFR
ALBUMIN: 4.3 g/dL (ref 3.5–4.8)
ALK PHOS: 98 IU/L (ref 39–117)
ALT: 10 IU/L (ref 0–32)
AST: 17 IU/L (ref 0–40)
Albumin/Globulin Ratio: 2.2 (ref 1.2–2.2)
BILIRUBIN TOTAL: 0.5 mg/dL (ref 0.0–1.2)
BUN / CREAT RATIO: 21 (ref 12–28)
BUN: 15 mg/dL (ref 8–27)
CHLORIDE: 104 mmol/L (ref 96–106)
CO2: 28 mmol/L (ref 20–29)
CREATININE: 0.7 mg/dL (ref 0.57–1.00)
Calcium: 9.2 mg/dL (ref 8.7–10.3)
GFR calc non Af Amer: 87 mL/min/{1.73_m2} (ref >59)
GFR, EST AFRICAN AMERICAN: 101 mL/min/{1.73_m2} (ref >59)
GLOBULIN, TOTAL: 2 g/dL (ref 1.5–4.5)
Glucose: 89 mg/dL (ref 65–99)
Potassium: 4 mmol/L (ref 3.5–5.2)
Sodium: 146 mmol/L — ABNORMAL HIGH (ref 134–144)
TOTAL PROTEIN: 6.3 g/dL (ref 6.0–8.5)

## 2017-06-29 LAB — CBC WITH DIFFERENTIAL/PLATELET
Basophils Absolute: 0.1 10*3/uL (ref 0.0–0.2)
Basos: 1 %
EOS (ABSOLUTE): 0.4 10*3/uL (ref 0.0–0.4)
EOS: 6 %
HEMATOCRIT: 34.6 % (ref 34.0–46.6)
HEMOGLOBIN: 11.8 g/dL (ref 11.1–15.9)
Immature Grans (Abs): 0 10*3/uL (ref 0.0–0.1)
Immature Granulocytes: 0 %
LYMPHS ABS: 1.7 10*3/uL (ref 0.7–3.1)
Lymphs: 26 %
MCH: 29 pg (ref 26.6–33.0)
MCHC: 34.1 g/dL (ref 31.5–35.7)
MCV: 85 fL (ref 79–97)
MONOCYTES: 7 %
MONOS ABS: 0.5 10*3/uL (ref 0.1–0.9)
NEUTROS ABS: 3.7 10*3/uL (ref 1.4–7.0)
Neutrophils: 60 %
Platelets: 210 10*3/uL (ref 150–379)
RBC: 4.07 x10E6/uL (ref 3.77–5.28)
RDW: 14.8 % (ref 12.3–15.4)
WBC: 6.3 10*3/uL (ref 3.4–10.8)

## 2017-06-29 LAB — THYROID PANEL WITH TSH
Free Thyroxine Index: 2.3 (ref 1.2–4.9)
T3 Uptake Ratio: 24 % (ref 24–39)
T4 TOTAL: 9.4 ug/dL (ref 4.5–12.0)
TSH: 1.78 u[IU]/mL (ref 0.450–4.500)

## 2017-06-29 LAB — LIPID PANEL
CHOLESTEROL TOTAL: 232 mg/dL — AB (ref 100–199)
Chol/HDL Ratio: 4.1 ratio (ref 0.0–4.4)
HDL: 57 mg/dL (ref >39)
LDL CALC: 159 mg/dL — AB (ref 0–99)
Triglycerides: 80 mg/dL (ref 0–149)
VLDL CHOLESTEROL CAL: 16 mg/dL (ref 5–40)

## 2017-07-10 ENCOUNTER — Other Ambulatory Visit: Payer: Self-pay | Admitting: Nurse Practitioner

## 2017-07-10 DIAGNOSIS — F411 Generalized anxiety disorder: Secondary | ICD-10-CM

## 2017-07-10 DIAGNOSIS — F5101 Primary insomnia: Secondary | ICD-10-CM

## 2017-07-10 NOTE — Telephone Encounter (Signed)
Last seen 06/28/17  MMM If approved route to nurse to call into [facility]

## 2017-07-10 NOTE — Telephone Encounter (Signed)
Phoned in.

## 2017-07-10 NOTE — Telephone Encounter (Signed)
Please call in zolpidem and alprazolam with 1each refills

## 2017-07-11 ENCOUNTER — Encounter: Payer: Self-pay | Admitting: *Deleted

## 2017-09-07 ENCOUNTER — Other Ambulatory Visit: Payer: Self-pay | Admitting: Nurse Practitioner

## 2017-09-07 DIAGNOSIS — F411 Generalized anxiety disorder: Secondary | ICD-10-CM

## 2017-09-07 DIAGNOSIS — F5101 Primary insomnia: Secondary | ICD-10-CM

## 2017-09-07 NOTE — Telephone Encounter (Signed)
Last seen 06/28/17, last filled 08/10/17

## 2017-10-04 DIAGNOSIS — G8929 Other chronic pain: Secondary | ICD-10-CM | POA: Diagnosis not present

## 2017-10-04 DIAGNOSIS — M19011 Primary osteoarthritis, right shoulder: Secondary | ICD-10-CM | POA: Diagnosis not present

## 2017-10-04 DIAGNOSIS — M7541 Impingement syndrome of right shoulder: Secondary | ICD-10-CM | POA: Diagnosis not present

## 2017-10-06 ENCOUNTER — Other Ambulatory Visit: Payer: Self-pay | Admitting: Nurse Practitioner

## 2017-10-06 DIAGNOSIS — F411 Generalized anxiety disorder: Secondary | ICD-10-CM

## 2017-10-06 DIAGNOSIS — F5101 Primary insomnia: Secondary | ICD-10-CM

## 2017-11-06 ENCOUNTER — Other Ambulatory Visit: Payer: Self-pay | Admitting: Nurse Practitioner

## 2017-11-06 DIAGNOSIS — F5101 Primary insomnia: Secondary | ICD-10-CM

## 2017-11-06 DIAGNOSIS — F411 Generalized anxiety disorder: Secondary | ICD-10-CM

## 2017-11-10 DIAGNOSIS — R69 Illness, unspecified: Secondary | ICD-10-CM | POA: Diagnosis not present

## 2017-12-06 ENCOUNTER — Other Ambulatory Visit: Payer: Self-pay | Admitting: Nurse Practitioner

## 2017-12-06 DIAGNOSIS — F411 Generalized anxiety disorder: Secondary | ICD-10-CM

## 2017-12-06 DIAGNOSIS — F5101 Primary insomnia: Secondary | ICD-10-CM

## 2017-12-26 ENCOUNTER — Encounter: Payer: Self-pay | Admitting: Nurse Practitioner

## 2017-12-26 ENCOUNTER — Ambulatory Visit (INDEPENDENT_AMBULATORY_CARE_PROVIDER_SITE_OTHER): Payer: Medicare HMO | Admitting: Nurse Practitioner

## 2017-12-26 VITALS — BP 113/62 | HR 79 | Temp 97.0°F | Ht 64.0 in | Wt 178.0 lb

## 2017-12-26 DIAGNOSIS — F5101 Primary insomnia: Secondary | ICD-10-CM

## 2017-12-26 DIAGNOSIS — D649 Anemia, unspecified: Secondary | ICD-10-CM

## 2017-12-26 DIAGNOSIS — M80079D Age-related osteoporosis with current pathological fracture, unspecified ankle and foot, subsequent encounter for fracture with routine healing: Secondary | ICD-10-CM | POA: Diagnosis not present

## 2017-12-26 DIAGNOSIS — R6889 Other general symptoms and signs: Secondary | ICD-10-CM | POA: Diagnosis not present

## 2017-12-26 DIAGNOSIS — R69 Illness, unspecified: Secondary | ICD-10-CM | POA: Diagnosis not present

## 2017-12-26 DIAGNOSIS — E034 Atrophy of thyroid (acquired): Secondary | ICD-10-CM

## 2017-12-26 DIAGNOSIS — E785 Hyperlipidemia, unspecified: Secondary | ICD-10-CM

## 2017-12-26 DIAGNOSIS — Z6831 Body mass index (BMI) 31.0-31.9, adult: Secondary | ICD-10-CM | POA: Diagnosis not present

## 2017-12-26 DIAGNOSIS — F3342 Major depressive disorder, recurrent, in full remission: Secondary | ICD-10-CM | POA: Diagnosis not present

## 2017-12-26 DIAGNOSIS — G2581 Restless legs syndrome: Secondary | ICD-10-CM

## 2017-12-26 DIAGNOSIS — F411 Generalized anxiety disorder: Secondary | ICD-10-CM

## 2017-12-26 DIAGNOSIS — G629 Polyneuropathy, unspecified: Secondary | ICD-10-CM

## 2017-12-26 DIAGNOSIS — I10 Essential (primary) hypertension: Secondary | ICD-10-CM

## 2017-12-26 MED ORDER — ROPINIROLE HCL 0.25 MG PO TABS: ORAL_TABLET | ORAL | 1 refills | Status: DC

## 2017-12-26 MED ORDER — ZOLPIDEM TARTRATE 10 MG PO TABS: 10.0000 mg | ORAL_TABLET | Freq: Every evening | ORAL | 2 refills | Status: DC | PRN

## 2017-12-26 MED ORDER — LEVOTHYROXINE SODIUM 75 MCG PO TABS: 75.0000 ug | ORAL_TABLET | Freq: Every day | ORAL | 1 refills | Status: DC

## 2017-12-26 MED ORDER — GABAPENTIN 100 MG PO CAPS
ORAL_CAPSULE | ORAL | 5 refills | Status: DC
Start: 2017-12-26 — End: 2018-07-05

## 2017-12-26 MED ORDER — ALPRAZOLAM 0.25 MG PO TABS: ORAL_TABLET | ORAL | 2 refills | Status: DC

## 2017-12-26 MED ORDER — SERTRALINE HCL 50 MG PO TABS: ORAL_TABLET | ORAL | 1 refills | Status: DC

## 2017-12-26 MED ORDER — BENAZEPRIL-HYDROCHLOROTHIAZIDE 20-12.5 MG PO TABS: 1.0000 | ORAL_TABLET | Freq: Every day | ORAL | 1 refills | Status: DC

## 2017-12-26 NOTE — Progress Notes (Signed)
Subjective:    Patient ID: Phyllis Walker, female    DOB: 29-Mar-1946, 72 y.o.   MRN: [id number]  HPI  Phyllis Walker is here today for follow up of chronic medical problem.  Outpatient Encounter Medications as of 12/26/2017  Medication Sig  . albuterol (PROVENTIL HFA;VENTOLIN HFA) 108 (90 Base) MCG/ACT inhaler Inhale 2 puffs into the lungs every 6 (six) hours as needed for wheezing or shortness of breath.  . ALPRAZolam (XANAX) 0.25 MG tablet TAKE (1) TABLET TWICE A DAY AS NEEDED.  [name] aspirin EC 81 MG tablet Take 81 mg by mouth daily.  . benazepril-hydrochlorthiazide (LOTENSIN HCT) 20-12.5 MG tablet Take 1 tablet by mouth daily.  [name] gabapentin (NEURONTIN) 100 MG capsule TAKE (1) CAPSULE THREE TIMES DAILY.  [name] levothyroxine (SYNTHROID, LEVOTHROID) 75 MCG tablet Take 1 tablet (75 mcg total) by mouth daily.  [name] rOPINIRole (REQUIP) 0.25 MG tablet TAKE  (1)  TABLET  THREE TIMES DAILY.  [name] sertraline (ZOLOFT) 50 MG tablet Take 1 tablet by mouth  daily  . Spacer/Aero Chamber Mouthpiece MISC 1 each by Does not apply route every 6 (six) hours as needed.  . zolpidem (AMBIEN) 10 MG tablet TAKE 1 TABLET AT BEDTIME AS NEEDED FOR SLEEP     1. Hypertension, benign essential, goal below 140/90  No c/o chest pain , sob or headache. Does not check blood pressures at home. BP Readings from Last 3 Encounters:  06/28/17 116/73  02/22/17 119/73  12/29/16 120/65     2. Hypothyroidism due to acquired atrophy of thyroid  Not having any problems that she is aware of.  3. Neuropathy  Has neuropathy in both feet. They either hurt or feel like they are burning. Gabapentin really helps.  4. Osteoporosis with pathological fracture of ankle and foot, unspecified laterality, with routine healing, subsequent encounter last dexascan was 2015 with -t score of -2.1, she is currently on no medication.   5. Restless leg syndrome  Is on requip daily which helps   6. Primary insomnia  Takes ambien to sleep and is doing well.    7. Hyperlipidemia with target LDL less than 100  Does not really watch diet  8. GAD (generalized anxiety disorder)  Has been on xanax bid for several years  9. Recurrent major depressive disorder, in full remission (Phyllis Walker)  Is on zoloft and has no side effects from medication, Depression screen [facility] 2/9 12/26/2017 06/28/2017 02/22/2017  Decreased Interest 0 0 0  Down, Depressed, Hopeless 0 0 0  PHQ - 2 Score 0 0 0     10. BMI 31.0-31.9,adult  No significant weight changes  11. Anemia, unspecified type  labs are due to be drawn today    New complaints: None today  Social history: Lives alone- has a son and daughter that lives out of town that she sees often. She has several grandchildren that she is very close to.       Review of Systems  Constitutional: Negative for activity change and appetite change.  HENT: Negative.   Eyes: Negative for pain.  Respiratory: Negative for shortness of breath.   Cardiovascular: Negative for chest pain, palpitations and leg swelling.  Gastrointestinal: Negative for abdominal pain.  Endocrine: Negative for polydipsia.  Genitourinary: Negative.   Skin: Negative for rash.  Neurological: Negative for dizziness, weakness and headaches.  Hematological: Does not bruise/bleed easily.  Psychiatric/Behavioral: Negative.   All other systems reviewed and are negative.      Objective:   Physical  Exam  Constitutional: She is oriented to person, place, and time. She appears well-developed and well-nourished.  HENT:  Nose: Nose normal.  Mouth/Throat: Oropharynx is clear and moist.  Eyes: EOM are normal.  Neck: Trachea normal, normal range of motion and full passive range of motion without pain. Neck supple. No JVD present. Carotid bruit is not present. No thyromegaly present.  Cardiovascular: Normal rate, regular rhythm, normal heart sounds and intact distal pulses. Exam reveals no gallop and no friction rub.  No murmur heard. Pulmonary/Chest:  Effort normal and breath sounds normal.  Abdominal: Soft. Bowel sounds are normal. She exhibits no distension and no mass. There is no tenderness.  Musculoskeletal: Normal range of motion.  Lymphadenopathy:    She has no cervical adenopathy.  Neurological: She is alert and oriented to person, place, and time. She has normal reflexes.  Skin: Skin is warm and dry.  Psychiatric: She has a normal mood and affect. Her behavior is normal. Judgment and thought content normal.   BP 113/62   Pulse 79   Temp (!) 97 F (36.1 C) (Oral)   Ht _0  (1.626 m)   Wt 178 lb (80.7 kg)   BMI 30.55 kg/m        Assessment & Plan:  1. Hypertension, benign essential, goal below 140/90 Low sodium diet - CMP14+EGFR - benazepril-hydrochlorthiazide (LOTENSIN HCT) 20-12.5 MG tablet; Take 1 tablet by mouth daily.  Dispense: 90 tablet; Refill: 1  2. Hypothyroidism due to acquired atrophy of thyroid - Thyroid Panel With TSH - levothyroxine (SYNTHROID, LEVOTHROID) 75 MCG tablet; Take 1 tablet (75 mcg total) by mouth daily.  Dispense: 90 tablet; Refill: 1  3. Neuropathy Do not go barefooted - gabapentin (NEURONTIN) 100 MG capsule; TAKE (1) CAPSULE THREE TIMES DAILY.  Dispense: 90 capsule; Refill: 5  4. Osteoporosis with pathological fracture of ankle and foot, unspecified laterality, with routine healing, subsequent encounter Refuses dexasca refusex alendronate or fosamax  5. Restless leg syndrome Keep legs warm - rOPINIRole (REQUIP) 0.25 MG tablet; TAKE  (1)  TABLET  THREE TIMES DAILY.  Dispense: 270 tablet; Refill: 1  6. Primary insomnia Bedtime routine - zolpidem (AMBIEN) 10 MG tablet; Take 1 tablet (10 mg total) by mouth at bedtime as needed. for sleep  Dispense: 30 tablet; Refill: 2  7. Hyperlipidemia with target LDL less than 100 Low fat diet - Lipid panel  8. GAD (generalized anxiety disorder) Stress management - ALPRAZolam (XANAX) 0.25 MG tablet; TAKE (1) TABLET TWICE A DAY AS NEEDED.   Dispense: 60 tablet; Refill: 2  9. Recurrent major depressive disorder, in full remission (Phyllis Walker) - sertraline (ZOLOFT) 50 MG tablet; Take 1 tablet by mouth  daily  Dispense: 90 tablet; Refill: 1  10. BMI 31.0-31.9,adult Discussed diet and exercise for person with BMI >25 Will recheck weight in 3-6 months  11. Anemia, unspecified type Labs pending - Anemia Profile B    Labs pending Health maintenance reviewed Diet and exercise encouraged Continue all meds Follow up  In 3 months   [place], FNP

## 2017-12-26 NOTE — Patient Instructions (Signed)
Stress and Stress Management Stress is a normal reaction to life events. It is what you feel when life demands more than you are used to or more than you can handle. Some stress can be useful. For example, the stress reaction can help you catch the last bus of the day, study for a test, or meet a deadline at work. But stress that occurs too often or for too long can cause problems. It can affect your emotional health and interfere with relationships and normal daily activities. Too much stress can weaken your immune system and increase your risk for physical illness. If you already have a medical problem, stress can make it worse. What are the causes? All sorts of life events may cause stress. An event that causes stress for one person may not be stressful for another person. Major life events commonly cause stress. These may be positive or negative. Examples include losing your job, moving into a new home, getting married, having a baby, or losing a loved one. Less obvious life events may also cause stress, especially if they occur day after day or in combination. Examples include working long hours, driving in traffic, caring for children, being in debt, or being in a difficult relationship. What are the signs or symptoms? Stress may cause emotional symptoms including, the following:  Anxiety. This is feeling worried, afraid, on edge, overwhelmed, or out of control.  Anger. This is feeling irritated or impatient.  Depression. This is feeling sad, down, helpless, or guilty.  Difficulty focusing, remembering, or making decisions.  Stress may cause physical symptoms, including the following:  Aches and pains. These may affect your head, neck, back, stomach, or other areas of your body.  Tight muscles or clenched jaw.  Low energy or trouble sleeping.  Stress may cause unhealthy behaviors, including the following:  Eating to feel better (overeating) or skipping meals.  Sleeping too little,  too much, or both.  Working too much or putting off tasks (procrastination).  Smoking, drinking alcohol, or using drugs to feel better.  How is this diagnosed? Stress is diagnosed through an assessment by your health care provider. Your health care provider will ask questions about your symptoms and any stressful life events.Your health care provider will also ask about your medical history and may order blood tests or other tests. Certain medical conditions and medicine can cause physical symptoms similar to stress. Mental illness can cause emotional symptoms and unhealthy behaviors similar to stress. Your health care provider may refer you to a mental health professional for further evaluation. How is this treated? Stress management is the recommended treatment for stress.The goals of stress management are reducing stressful life events and coping with stress in healthy ways. Techniques for reducing stressful life events include the following:  Stress identification. Self-monitor for stress and identify what causes stress for you. These skills may help you to avoid some stressful events.  Time management. Set your priorities, keep a calendar of events, and learn to say "no." These tools can help you avoid making too many commitments.  Techniques for coping with stress include the following:  Rethinking the problem. Try to think realistically about stressful events rather than ignoring them or overreacting. Try to find the positives in a stressful situation rather than focusing on the negatives.  Exercise. Physical exercise can release both physical and emotional tension. The key is to find a form of exercise you enjoy and do it regularly.  Relaxation techniques. These relax the body and  mind. Examples include yoga, meditation, tai chi, biofeedback, deep breathing, progressive muscle relaxation, listening to music, being out in nature, journaling, and other hobbies. Again, the key is to find  one or more that you enjoy and can do regularly.  Healthy lifestyle. Eat a balanced diet, get plenty of sleep, and do not smoke. Avoid using alcohol or drugs to relax.  Strong support network. Spend time with family, friends, or other people you enjoy being around.Express your feelings and talk things over with someone you trust.  Counseling or talktherapy with a mental health professional may be helpful if you are having difficulty managing stress on your own. Medicine is typically not recommended for the treatment of stress.Talk to your health care provider if you think you need medicine for symptoms of stress. Follow these instructions at home:  Keep all follow-up visits as directed by your health care provider.  Take all medicines as directed by your health care provider. Contact a health care provider if:  Your symptoms get worse or you start having new symptoms.  You feel overwhelmed by your problems and can no longer manage them on your own. Get help right away if:  You feel like hurting yourself or someone else. This information is not intended to replace advice given to you by your health care provider. Make sure you discuss any questions you have with your health care provider. Document Released: 02/22/2001 Document Revised: 02/04/2016 Document Reviewed: 04/23/2013 Elsevier Interactive Patient Education  2017 Elsevier Inc.  

## 2017-12-27 LAB — ANEMIA PROFILE B
BASOS: 1 %
Basophils Absolute: 0.1 10*3/uL (ref 0.0–0.2)
EOS (ABSOLUTE): 0.3 10*3/uL (ref 0.0–0.4)
Eos: 5 %
FERRITIN: 28 ng/mL (ref 15–150)
FOLATE: 9.2 ng/mL (ref >3.0)
HEMATOCRIT: 38.5 % (ref 34.0–46.6)
Hemoglobin: 12.2 g/dL (ref 11.1–15.9)
IRON SATURATION: 19 % (ref 15–55)
IRON: 64 ug/dL (ref 27–139)
Immature Grans (Abs): 0 10*3/uL (ref 0.0–0.1)
Immature Granulocytes: 0 %
LYMPHS ABS: 1.7 10*3/uL (ref 0.7–3.1)
Lymphs: 29 %
MCH: 28.8 pg (ref 26.6–33.0)
MCHC: 31.7 g/dL (ref 31.5–35.7)
MCV: 91 fL (ref 79–97)
MONOCYTES: 4 %
Monocytes Absolute: 0.3 10*3/uL (ref 0.1–0.9)
NEUTROS ABS: 3.6 10*3/uL (ref 1.4–7.0)
Neutrophils: 61 %
Platelets: 246 10*3/uL (ref 150–379)
RBC: 4.23 x10E6/uL (ref 3.77–5.28)
RDW: 13.4 % (ref 12.3–15.4)
RETIC CT PCT: 0.9 % (ref 0.6–2.6)
TIBC: 335 ug/dL (ref 250–450)
UIBC: 271 ug/dL (ref 118–369)
VITAMIN B 12: 201 pg/mL — AB (ref 232–1245)
WBC: 5.9 10*3/uL (ref 3.4–10.8)

## 2017-12-27 LAB — CMP14+EGFR
A/G RATIO: 1.9 (ref 1.2–2.2)
ALK PHOS: 99 IU/L (ref 39–117)
ALT: 10 IU/L (ref 0–32)
AST: 19 IU/L (ref 0–40)
Albumin: 4.3 g/dL (ref 3.5–4.8)
BILIRUBIN TOTAL: 0.3 mg/dL (ref 0.0–1.2)
BUN/Creatinine Ratio: 18 (ref 12–28)
BUN: 14 mg/dL (ref 8–27)
CHLORIDE: 102 mmol/L (ref 96–106)
CO2: 28 mmol/L (ref 20–29)
Calcium: 9.3 mg/dL (ref 8.7–10.3)
Creatinine, Ser: 0.76 mg/dL (ref 0.57–1.00)
GFR calc non Af Amer: 79 mL/min/{1.73_m2} (ref >59)
GFR, EST AFRICAN AMERICAN: 91 mL/min/{1.73_m2} (ref >59)
GLOBULIN, TOTAL: 2.3 g/dL (ref 1.5–4.5)
Glucose: 88 mg/dL (ref 65–99)
POTASSIUM: 3.9 mmol/L (ref 3.5–5.2)
Sodium: 144 mmol/L (ref 134–144)
TOTAL PROTEIN: 6.6 g/dL (ref 6.0–8.5)

## 2017-12-27 LAB — THYROID PANEL WITH TSH
FREE THYROXINE INDEX: 2.1 (ref 1.2–4.9)
T3 Uptake Ratio: 23 % — ABNORMAL LOW (ref 24–39)
T4, Total: 9.3 ug/dL (ref 4.5–12.0)
TSH: 1.77 u[IU]/mL (ref 0.450–4.500)

## 2017-12-27 LAB — LIPID PANEL
Chol/HDL Ratio: 3.1 ratio (ref 0.0–4.4)
Cholesterol, Total: 214 mg/dL — ABNORMAL HIGH (ref 100–199)
HDL: 68 mg/dL (ref >39)
LDL Calculated: 133 mg/dL — ABNORMAL HIGH (ref 0–99)
Triglycerides: 65 mg/dL (ref 0–149)
VLDL CHOLESTEROL CAL: 13 mg/dL (ref 5–40)

## 2017-12-28 ENCOUNTER — Ambulatory Visit: Payer: PPO | Admitting: Nurse Practitioner

## 2018-01-02 ENCOUNTER — Other Ambulatory Visit: Payer: Self-pay | Admitting: *Deleted

## 2018-01-02 ENCOUNTER — Ambulatory Visit (INDEPENDENT_AMBULATORY_CARE_PROVIDER_SITE_OTHER): Payer: Medicare HMO | Admitting: *Deleted

## 2018-01-02 VITALS — BP 108/62 | HR 70 | Ht 64.0 in | Wt 181.0 lb

## 2018-01-02 DIAGNOSIS — E538 Deficiency of other specified B group vitamins: Secondary | ICD-10-CM

## 2018-01-02 DIAGNOSIS — Z0000 Encounter for general adult medical examination without abnormal findings: Secondary | ICD-10-CM | POA: Diagnosis not present

## 2018-01-02 MED ORDER — CYANOCOBALAMIN 1000 MCG/ML IJ SOLN
1000.0000 ug | INTRAMUSCULAR | Status: DC
  Administered 2018-01-02 – 2018-03-28 (×9): 1000 ug via INTRAMUSCULAR

## 2018-01-02 NOTE — Patient Instructions (Signed)
  Phyllis Walker , Thank you for taking time to come for your Medicare Wellness Visit. I appreciate your ongoing commitment to your health goals. Please review the following plan we discussed and let me know if I can assist you in the future.   These are the goals we discussed: Goals    . Exercise 3x per week (30 min per time)    . Weight (lb) < 160 lb (72.6 kg)    . Weight < 170 lb (77.111 kg)       This is a list of the screening recommended for you and due dates:  Health Maintenance  Topic Date Due  . Mammogram  03/28/2018  . Flu Shot  04/12/2018  . Colon Cancer Screening  05/17/2022  . Tetanus Vaccine  12/30/2026  . DEXA scan (bone density measurement)  Completed  .  Hepatitis C: One time screening is recommended by Center for Disease Control  (CDC) for  adults born from [birth year] through 1966.   Completed  . Pneumonia vaccines  Completed

## 2018-01-02 NOTE — Progress Notes (Addendum)
Subjective:   Phyllis Walker is a 72 y.o. female who presents for Medicare Annual (Subsequent) preventive examination. Ms. 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 She enjoys dancing. She dances at least once a week and sometimes twice a week. She lives in [place] by herself and doesn't cook often. She has 2 adult children and 5 granddaughters. Patient states that her diet is semi-healthy.  She attends church weekly. She does not have any pets or stairs in the home. Patient does have 2 rugs that are on top of carpet. Fall hazards discussed with patient. Patient states her health is the same as it was last year at this time. Patient has not had any hospitalizations or surgeries in the past year.  Review of Systems:   Cardiac Risk Factors include: advanced age (>20men, >93 women);hypertension;obesity (BMI >30kg/m2)     Objective:     Vitals: BP 108/62   Pulse 70   Ht 5\' 4"  (1.626 m)   Wt 181 lb (82.1 kg)   BMI 31.07 kg/m   Body mass index is 31.07 kg/m.  Advanced Directives 01/02/2018 12/29/2016 08/08/2016 05/27/2015 08/20/2014 02/21/2014  Does Patient Have a Medical Advance Directive? Yes No No Yes Yes Patient does not have advance directive;Patient would like information  Type of Advance Directive Living will - - Living will - -  Copy of [place] in Chart? - - - No - copy requested No - copy requested -  Would patient like information on creating a medical advance directive? - Yes (MAU/Ambulatory/Procedural Areas - Information given) No - Patient declined - - Advance directive packet given    Tobacco Social History   Tobacco Use  Smoking Status Former Smoker  . Types: Cigarettes  . Last attempt to quit: 12/21/2006  . Years since quitting: 11.0  Smokeless Tobacco Never Used     Counseling given: Not Answered   Clinical Intake:     Pain : No/denies pain     BMI - recorded: 31.07 Nutritional Status: BMI > 30  Obese Nutritional Risks:  None Diabetes: No  How often do you need to have someone help you when you read instructions, pamphlets, or other written materials from your doctor or pharmacy?: 1 - Never  Interpreter Needed?: No     Past Medical History:  Diagnosis Date  . Anemia   . Anxiety   . Arthritis   . Cataract   . Depression   . Fibroid tumor 1985   in breast  . History of hiatal hernia   . Hypercholesteremia   . Hypertension   . Hypothyroidism   . Neuropathy    in feet  . Restless leg syndrome    Past Surgical History:  Procedure Laterality Date  . BREAST LUMPECTOMY  1987   benign fibroid tumor  . BUNIONECTOMY Bilateral   . CATARACT EXTRACTION    . COLONOSCOPY    . THYROIDECTOMY, PARTIAL  1969  . TONSILLECTOMY    . 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 Cancer Mother        pancreas  . Heart attack Father   . Hypertension Sister   . Hyperlipidemia Sister   . Hypertension Sister   . Hyperlipidemia Sister   . Early death Sister 38  accident  . Ovarian cancer Maternal Aunt    Social History   Socioeconomic History  . Marital status: Divorced    Spouse name: Not on file  . Number of children: 2  . Years of education: 33  . Highest education level: Not on file  Occupational History  . Not on file  Social Needs  . Financial resource strain: Not hard at all  . Food insecurity:    Worry: Never true    Inability: Never true  . Transportation needs:    Medical: No    Non-medical: No  Tobacco Use  . Smoking status: Former Smoker    Types: Cigarettes    Last attempt to quit: 12/21/2006    Years since quitting: 11.0  . Smokeless tobacco: Never Used  Substance and Sexual Activity  . Alcohol use: No  . Drug use: No  . Sexual activity: Not Currently  Lifestyle  . Physical activity:    Days  per week: 2 days    Minutes per session: 60 min  . Stress: Not at all  Relationships  . Social connections:    Talks on phone: More than three times a week    Gets together: More than three times a week    Attends religious service: More than 4 times per year    Active member of club or organization: No    Attends meetings of clubs or organizations: Never    Relationship status: Divorced  Other Topics Concern  . Not on file  Social History Narrative  . Not on file    Outpatient Encounter Medications as of 01/02/2018  Medication Sig  . ALPRAZolam (XANAX) 0.25 MG tablet TAKE (1) TABLET TWICE A DAY AS NEEDED.  [name] benazepril-hydrochlorthiazide (LOTENSIN HCT) 20-12.5 MG tablet Take 1 tablet by mouth daily. (Patient taking differently: Take 0.5 tablets by mouth daily. )  . gabapentin (NEURONTIN) 100 MG capsule TAKE (1) CAPSULE THREE TIMES DAILY.  [name] levothyroxine (SYNTHROID, LEVOTHROID) 75 MCG tablet Take 1 tablet (75 mcg total) by mouth daily.  . Omega-3 Fatty Acids (FISH OIL) 1200 MG CAPS Take 1,200 mg by mouth daily.  . Red Yeast Rice 600 MG CAPS Take by mouth daily.  [name] rOPINIRole (REQUIP) 0.25 MG tablet TAKE  (1)  TABLET  THREE TIMES DAILY.  [name] sertraline (ZOLOFT) 50 MG tablet Take 1 tablet by mouth  daily  . zolpidem (AMBIEN) 10 MG tablet Take 1 tablet (10 mg total) by mouth at bedtime as needed. for sleep   Facility-Administered Encounter Medications as of 01/02/2018  Medication  . cyanocobalamin ((VITAMIN B-12)) injection 1,000 mcg    Activities of Daily Living In your present state of health, do you have any difficulty performing the following activities: 01/02/2018  Hearing? N  Vision? N  Difficulty concentrating or making decisions? N  Walking or climbing stairs? N  Dressing or bathing? N  Doing errands, shopping? N  Preparing Food and eating ? N  Using the Toilet? N  In the past six months, have you accidently leaked urine? N  Do you have problems with loss of bowel  control? N  Managing your Medications? N  Managing your Finances? N  Housekeeping or managing your Housekeeping? N  Some recent data might be hidden    Patient Care Team: Chevis Pretty, FNP as PCP - General (Nurse Practitioner) Vickey Huger, MD as Consulting Physician (Orthopedic Surgery) Huel Cote, NP as Nurse Practitioner (Obstetrics and Gynecology)    Assessment:   This is  a routine wellness examination for Phyllis Walker.  Exercise Activities and Dietary recommendations Current Exercise Habits: Home exercise routine, Type of exercise: Other - see comments(dancing ), Time (Minutes): 60, Frequency (Times/Week): 2, Weekly Exercise (Minutes/Week): 120, Intensity: Mild  Goals    . Exercise 3x per week (30 min per time)    . Weight (lb) < 160 lb (72.6 kg)    . Weight < 170 lb (77.111 kg)       Fall Risk Fall Risk  01/02/2018 12/26/2017 06/28/2017 02/22/2017 12/29/2016  Falls in the past year? No No No No No   Is the patient's home free of loose throw rugs in walkways, pet beds, electrical cords, etc?   yes      Grab bars in the bathroom? no      Handrails on the stairs?  No stairs in the home      Adequate lighting?   yes  Timed Get Up and Go performed:  Depression Screen PHQ 2/9 Scores 01/02/2018 12/26/2017 06/28/2017 02/22/2017  PHQ - 2 Score 0 0 0 0     Cognitive Function MMSE - Mini Mental State Exam 01/02/2018 12/29/2016 05/27/2015  Orientation to time 5 5 5   Orientation to Place 5 5 5   Registration 3 3 3   Attention/ Calculation 5 5 5   Recall 3 3 3   Language- name 2 objects 2 2 2   Language- repeat 1 1 1   Language- follow 3 step command 3 3 3   Language- read & follow direction 1 1 1   Write a sentence 1 1 1   Copy design 1 1 1   Total score 30 30 30         Immunization History  Administered Date(s) Administered  . Influenza Split 06/18/2013  . Influenza,inj,Quad PF,6+ Mos 06/09/2014, 06/19/2015, 06/10/2017  . Influenza-Unspecified 06/20/2016  . Pneumococcal  Conjugate-13 11/20/2014  . Pneumococcal Polysaccharide-23 09/12/2010  . Tdap 12/29/2016  . Zoster 05/24/2013    Qualifies for Shingles Vaccine?Yes and will consider at next visit.  Screening Tests Health Maintenance  Topic Date Due  . MAMMOGRAM  03/28/2018  . INFLUENZA VACCINE  04/12/2018  . COLONOSCOPY  05/17/2022  . TETANUS/TDAP  12/30/2026  . DEXA SCAN  Completed  . Hepatitis C Screening  Completed  . PNA vac Low Risk Adult  Completed    Cancer Screenings: Lung: Low Dose CT Chest recommended if Age 25-80 years, 30 pack-year currently smoking OR have quit w/in 15years. Patient does not qualify. Breast:  Up to date on Mammogram? Yes   Up to date of Bone Density/Dexa? No Patient declined at this time Colorectal: Up to date and is due in 05/2022  Additional Screenings:  Hepatitis C Screening: Completed     Plan:  Patient will follow up with Ronnald Collum, FNP as planned.  Patient started on B12 injections today and will return next week for next injection Shingrix to be discussed at next visit. Patient declined dexa scan today   I have personally reviewed and noted the following in the patient's chart:   . Medical and social history . Use of alcohol, tobacco or illicit drugs  . Current medications and supplements . Functional ability and status . Nutritional status . Physical activity . Advanced directives . List of other physicians . Hospitalizations, surgeries, and ER visits in previous 12 months . Vitals . Screenings to include cognitive, depression, and falls . Referrals and appointments  In addition, I have reviewed and discussed with patient certain preventive protocols, quality metrics, and best practice recommendations.  A written personalized care plan for preventive services as well as general preventive health recommendations were provided to patient.     Gareth Morgan, LPN  [id number]  I have reviewed and agree with the above AWV documentation.    Mary-Margaret Hassell Done, FNP

## 2018-01-09 ENCOUNTER — Ambulatory Visit (INDEPENDENT_AMBULATORY_CARE_PROVIDER_SITE_OTHER): Payer: Medicare HMO | Admitting: *Deleted

## 2018-01-09 DIAGNOSIS — E538 Deficiency of other specified B group vitamins: Secondary | ICD-10-CM

## 2018-01-09 NOTE — Progress Notes (Signed)
Pt given Cyanocobalamin inj Tolerated well 

## 2018-01-16 ENCOUNTER — Ambulatory Visit (INDEPENDENT_AMBULATORY_CARE_PROVIDER_SITE_OTHER): Payer: Medicare HMO | Admitting: *Deleted

## 2018-01-16 DIAGNOSIS — E538 Deficiency of other specified B group vitamins: Secondary | ICD-10-CM

## 2018-01-16 NOTE — Progress Notes (Signed)
Pt given Cyanocobalamin inj Tolerated well 

## 2018-01-23 ENCOUNTER — Ambulatory Visit (INDEPENDENT_AMBULATORY_CARE_PROVIDER_SITE_OTHER): Payer: Medicare HMO | Admitting: *Deleted

## 2018-01-23 DIAGNOSIS — E538 Deficiency of other specified B group vitamins: Secondary | ICD-10-CM | POA: Diagnosis not present

## 2018-01-23 NOTE — Progress Notes (Signed)
Pt given Cyanocobalamin inj Tolerated well 

## 2018-01-30 ENCOUNTER — Ambulatory Visit (INDEPENDENT_AMBULATORY_CARE_PROVIDER_SITE_OTHER): Payer: Medicare HMO | Admitting: *Deleted

## 2018-01-30 DIAGNOSIS — E538 Deficiency of other specified B group vitamins: Secondary | ICD-10-CM

## 2018-01-30 NOTE — Progress Notes (Signed)
Pt given Cyanocobalamin inj Tolerated well 

## 2018-02-13 ENCOUNTER — Ambulatory Visit (INDEPENDENT_AMBULATORY_CARE_PROVIDER_SITE_OTHER): Payer: Medicare HMO | Admitting: *Deleted

## 2018-02-13 DIAGNOSIS — E538 Deficiency of other specified B group vitamins: Secondary | ICD-10-CM | POA: Diagnosis not present

## 2018-02-13 NOTE — Progress Notes (Signed)
Pt given Cyanocobalamin inj Tolerated well 

## 2018-02-21 DIAGNOSIS — G629 Polyneuropathy, unspecified: Secondary | ICD-10-CM | POA: Diagnosis not present

## 2018-02-21 DIAGNOSIS — E039 Hypothyroidism, unspecified: Secondary | ICD-10-CM | POA: Diagnosis not present

## 2018-02-21 DIAGNOSIS — R69 Illness, unspecified: Secondary | ICD-10-CM | POA: Diagnosis not present

## 2018-02-21 DIAGNOSIS — R32 Unspecified urinary incontinence: Secondary | ICD-10-CM | POA: Diagnosis not present

## 2018-02-21 DIAGNOSIS — E669 Obesity, unspecified: Secondary | ICD-10-CM | POA: Diagnosis not present

## 2018-02-21 DIAGNOSIS — J309 Allergic rhinitis, unspecified: Secondary | ICD-10-CM | POA: Diagnosis not present

## 2018-02-21 DIAGNOSIS — G4700 Insomnia, unspecified: Secondary | ICD-10-CM | POA: Diagnosis not present

## 2018-02-21 DIAGNOSIS — I10 Essential (primary) hypertension: Secondary | ICD-10-CM | POA: Diagnosis not present

## 2018-02-21 DIAGNOSIS — F419 Anxiety disorder, unspecified: Secondary | ICD-10-CM | POA: Diagnosis not present

## 2018-02-21 DIAGNOSIS — G2581 Restless legs syndrome: Secondary | ICD-10-CM | POA: Diagnosis not present

## 2018-02-27 ENCOUNTER — Ambulatory Visit (INDEPENDENT_AMBULATORY_CARE_PROVIDER_SITE_OTHER): Payer: Medicare HMO | Admitting: *Deleted

## 2018-02-27 DIAGNOSIS — E538 Deficiency of other specified B group vitamins: Secondary | ICD-10-CM

## 2018-02-27 NOTE — Progress Notes (Signed)
B12 injection given Tolerated well  

## 2018-02-27 NOTE — Patient Instructions (Signed)
Cyanocobalamin, Vitamin B12 injection What is this medicine? CYANOCOBALAMIN (sye an oh koe BAL a min) is a man made form of vitamin B12. Vitamin B12 is used in the growth of healthy blood cells, nerve cells, and proteins in the body. It also helps with the metabolism of fats and carbohydrates. This medicine is used to treat people who can not absorb vitamin B12. This medicine may be used for other purposes; ask your health care provider or pharmacist if you have questions. COMMON BRAND NAME(S): B-12 Compliance Kit, B-12 Injection Kit, Cyomin, LA-12, Nutri-Twelve, Physicians EZ Use B-12, Primabalt What should I tell my health care provider before I take this medicine? They need to know if you have any of these conditions: -kidney disease -Leber's disease -megaloblastic anemia -an unusual or allergic reaction to cyanocobalamin, cobalt, other medicines, foods, dyes, or preservatives -pregnant or trying to get pregnant -breast-feeding How should I use this medicine? This medicine is injected into a muscle or deeply under the skin. It is usually given by a health care professional in a clinic or doctor's office. However, your doctor may teach you how to inject yourself. Follow all instructions. Talk to your pediatrician regarding the use of this medicine in children. Special care may be needed. Overdosage: If you think you have taken too much of this medicine contact a poison control center or emergency room at once. NOTE: This medicine is only for you. Do not share this medicine with others. What if I miss a dose? If you are given your dose at a clinic or doctor's office, call to reschedule your appointment. If you give your own injections and you miss a dose, take it as soon as you can. If it is almost time for your next dose, take only that dose. Do not take double or extra doses. What may interact with this medicine? -colchicine -heavy alcohol intake This list may not describe all possible  interactions. Give your health care provider a list of all the medicines, herbs, non-prescription drugs, or dietary supplements you use. Also tell them if you smoke, drink alcohol, or use illegal drugs. Some items may interact with your medicine. What should I watch for while using this medicine? Visit your doctor or health care professional regularly. You may need blood work done while you are taking this medicine. You may need to follow a special diet. Talk to your doctor. Limit your alcohol intake and avoid smoking to get the best benefit. What side effects may I notice from receiving this medicine? Side effects that you should report to your doctor or health care professional as soon as possible: -allergic reactions like skin rash, itching or hives, swelling of the face, lips, or tongue -blue tint to skin -chest tightness, pain -difficulty breathing, wheezing -dizziness -red, swollen painful area on the leg Side effects that usually do not require medical attention (report to your doctor or health care professional if they continue or are bothersome): -diarrhea -headache This list may not describe all possible side effects. Call your doctor for medical advice about side effects. You may report side effects to FDA at 1-800-FDA-1088. Where should I keep my medicine? Keep out of the reach of children. Store at room temperature between 15 and 30 degrees C (59 and 85 degrees F). Protect from light. Throw away any unused medicine after the expiration date. NOTE: This sheet is a summary. It may not cover all possible information. If you have questions about this medicine, talk to your doctor, pharmacist, or  health care provider.  2018 Elsevier/Gold Standard (2007-12-10 22:10:20)

## 2018-03-14 ENCOUNTER — Ambulatory Visit (INDEPENDENT_AMBULATORY_CARE_PROVIDER_SITE_OTHER): Payer: Medicare HMO | Admitting: *Deleted

## 2018-03-14 DIAGNOSIS — E538 Deficiency of other specified B group vitamins: Secondary | ICD-10-CM | POA: Diagnosis not present

## 2018-03-14 NOTE — Progress Notes (Signed)
Pt given Cyanocobalamin inj Tolerated well 

## 2018-03-28 ENCOUNTER — Ambulatory Visit (INDEPENDENT_AMBULATORY_CARE_PROVIDER_SITE_OTHER): Payer: Medicare HMO | Admitting: *Deleted

## 2018-03-28 DIAGNOSIS — E538 Deficiency of other specified B group vitamins: Secondary | ICD-10-CM | POA: Diagnosis not present

## 2018-04-02 ENCOUNTER — Ambulatory Visit (INDEPENDENT_AMBULATORY_CARE_PROVIDER_SITE_OTHER): Payer: Medicare HMO | Admitting: Nurse Practitioner

## 2018-04-02 ENCOUNTER — Encounter: Payer: Self-pay | Admitting: Nurse Practitioner

## 2018-04-02 VITALS — BP 124/74 | HR 77 | Temp 97.0°F | Ht 64.0 in | Wt 178.0 lb

## 2018-04-02 DIAGNOSIS — G2581 Restless legs syndrome: Secondary | ICD-10-CM | POA: Diagnosis not present

## 2018-04-02 DIAGNOSIS — F3342 Major depressive disorder, recurrent, in full remission: Secondary | ICD-10-CM

## 2018-04-02 DIAGNOSIS — E034 Atrophy of thyroid (acquired): Secondary | ICD-10-CM

## 2018-04-02 DIAGNOSIS — Z6831 Body mass index (BMI) 31.0-31.9, adult: Secondary | ICD-10-CM

## 2018-04-02 DIAGNOSIS — D649 Anemia, unspecified: Secondary | ICD-10-CM

## 2018-04-02 DIAGNOSIS — E785 Hyperlipidemia, unspecified: Secondary | ICD-10-CM | POA: Diagnosis not present

## 2018-04-02 DIAGNOSIS — M25552 Pain in left hip: Secondary | ICD-10-CM | POA: Diagnosis not present

## 2018-04-02 DIAGNOSIS — R69 Illness, unspecified: Secondary | ICD-10-CM | POA: Diagnosis not present

## 2018-04-02 DIAGNOSIS — I10 Essential (primary) hypertension: Secondary | ICD-10-CM

## 2018-04-02 DIAGNOSIS — F5101 Primary insomnia: Secondary | ICD-10-CM

## 2018-04-02 DIAGNOSIS — M80079D Age-related osteoporosis with current pathological fracture, unspecified ankle and foot, subsequent encounter for fracture with routine healing: Secondary | ICD-10-CM | POA: Diagnosis not present

## 2018-04-02 DIAGNOSIS — G629 Polyneuropathy, unspecified: Secondary | ICD-10-CM

## 2018-04-02 DIAGNOSIS — F411 Generalized anxiety disorder: Secondary | ICD-10-CM

## 2018-04-02 MED ORDER — ALPRAZOLAM 0.25 MG PO TABS: ORAL_TABLET | ORAL | 2 refills | Status: DC

## 2018-04-02 MED ORDER — METHYLPREDNISOLONE ACETATE 80 MG/ML IJ SUSP: 80.0000 mg | Freq: Once | INTRAMUSCULAR | Status: DC

## 2018-04-02 MED ORDER — METHYLPREDNISOLONE ACETATE 80 MG/ML IJ SUSP
80.0000 mg | Freq: Once | INTRAMUSCULAR | Status: AC
  Administered 2018-04-02: 80 mg via INTRAMUSCULAR

## 2018-04-02 NOTE — Progress Notes (Addendum)
Subjective:    Patient ID: Phyllis Walker, female    DOB: [birth year], 72 y.o.   MRN: [id number]   Chief Complaint: Medical Management of Chronic Issues   HPI:  1. Hypertension, benign essential, goal below 140/90  No c/o chest pain, sob or headache. Doe snot check blood pressure at home. BP Readings from Last 3 Encounters:  04/02/18 124/74  01/02/18 108/62  12/26/17 113/62     2. Hypothyroidism due to acquired atrophy of thyroid  No complaints that she is aware of.  3. Neuropathy  Occasional urning sensation of feet at night. Denies any sores or lesions.  4. Osteoporosis with pathological fracture of ankle and foot, unspecified laterality, with routine healing, subsequent encounter last BMD was done 2015. t scaore was -2.7. Refuses to take bisphos.  5. Restless leg syndrome  Is on  requip which she says really helps.  6. Primary insomnia  Has to take ambien to sleep  7. Hyperlipidemia with target LDL less than 100  Stays active and tries to avoid fried foods.  8. GAD (generalized anxiety disorder)  Is under a lot of stress. Xanax works fine  9. Recurrent major depressive disorder, in full remission ([place])  Takes zoloft daily and says is working well. Depression screen [facility] 2/9 04/02/2018 01/02/2018 12/26/2017  Decreased Interest 0 0 0  Down, Depressed, Hopeless 0 0 0  PHQ - 2 Score 0 0 0     10. BMI 31.0-31.9,adult  Weight down 3 lbs from previous.  11. Anemia, unspecified type  No c/o fatigue    Outpatient Encounter Medications as of 04/02/2018  Medication Sig  . ALPRAZolam (XANAX) 0.25 MG tablet TAKE (1) TABLET TWICE A DAY AS NEEDED.  [name] benazepril-hydrochlorthiazide (LOTENSIN HCT) 20-12.5 MG tablet Take 1 tablet by mouth daily. (Patient taking differently: Take 0.5 tablets by mouth daily. )  . gabapentin (NEURONTIN) 100 MG capsule TAKE (1) CAPSULE THREE TIMES DAILY.  [name] levothyroxine (SYNTHROID, LEVOTHROID) 75 MCG tablet Take 1 tablet (75 mcg total) by mouth daily.  .  Omega-3 Fatty Acids (FISH OIL) 1200 MG CAPS Take 1,200 mg by mouth daily.  . Red Yeast Rice 600 MG CAPS Take by mouth daily.  [name] rOPINIRole (REQUIP) 0.25 MG tablet TAKE  (1)  TABLET  THREE TIMES DAILY.  [name] sertraline (ZOLOFT) 50 MG tablet Take 1 tablet by mouth  daily  . zolpidem (AMBIEN) 10 MG tablet Take 1 tablet (10 mg total) by mouth at bedtime as needed. for sleep       New complaints: Left intermittent hip pain- rates 5/10 currently. Is going on cruise in 2 weeks and would like for it to be some better before se goes.  Social history: Lives by herself. grandaughter is getting married soon in [place].     Review of Systems  Constitutional: Negative for activity change and appetite change.  HENT: Negative.   Eyes: Negative for pain.  Respiratory: Negative for shortness of breath.   Cardiovascular: Negative for chest pain, palpitations and leg swelling.  Gastrointestinal: Negative for abdominal pain.  Endocrine: Negative for polydipsia.  Genitourinary: Negative.   Skin: Negative for rash.  Neurological: Negative for dizziness, weakness and headaches.  Hematological: Does not bruise/bleed easily.  Psychiatric/Behavioral: Negative.   All other systems reviewed and are negative.      Objective:   Physical Exam  Constitutional: She is oriented to person, place, and time.  HENT:  Head: Normocephalic.  Nose: Nose normal.  Mouth/Throat: Oropharynx is clear  and moist.  Eyes: Pupils are equal, round, and reactive to light. EOM are normal.  Neck: Normal range of motion. Neck supple. No JVD present. Carotid bruit is not present.  Cardiovascular: Normal rate, regular rhythm, normal heart sounds and intact distal pulses.  Pulmonary/Chest: Effort normal and breath sounds normal. No respiratory distress. She has no wheezes. She has no rales. She exhibits no tenderness.  Abdominal: Soft. Normal appearance, normal aorta and bowel sounds are normal. She exhibits no distension, no  abdominal bruit, no pulsatile midline mass and no mass. There is no splenomegaly or hepatomegaly. There is no tenderness.  Musculoskeletal: She exhibits no edema.  FROM of left hip without pain. No point tnederness  Lymphadenopathy:    She has no cervical adenopathy.  Neurological: She is alert and oriented to person, place, and time. She has normal reflexes.  Skin: Skin is warm and dry.  Psychiatric: She has a normal mood and affect. Her behavior is normal. Judgment and thought content normal.    BP 124/74   Pulse 77   Temp (!) 97 F (36.1 C) (Oral)   Ht '5\' 4"'  (1.626 m)   Wt 178 lb (80.7 kg)   BMI 30.55 kg/m        Assessment & Plan:  KAYDIE PETSCH comes in today with chief complaint of Medical Management of Chronic Issues   Diagnosis and orders addressed:  1. Hypertension, benign essential, goal below 140/90 Low sodium diet - CMP14+EGFR  2. Hypothyroidism due to acquired atrophy of thyroid  3. Neuropathy Do not go barefooted  4. Osteoporosis with pathological fracture of ankle and foot, unspecified laterality, with routine healing, subsequent encounter Weight bearing exercises encouraged  5. Restless leg syndrome Keep legs warm at night  6. Primary insomnia Bedtime routine  7. Hyperlipidemia with target LDL less than 100 Low fat diet - Lipid panel  8. GAD (generalized anxiety disorder) Stress management - ALPRAZolam (XANAX) 0.25 MG tablet; TAKE (1) TABLET TWICE A DAY AS NEEDED.  Dispense: 60 tablet; Refill: 2  9. Recurrent major depressive disorder, in full remission ([place])   10. BMI 31.0-31.9,adult Discussed diet and exercise for person with BMI >25 Will recheck weight in 3-6 months  11. Anemia, unspecified type  - CBC with Differential/Platelet   12. hip pain -depormedrol 74m IM now  Patient will schedule mammogram Labs pending Health Maintenance reviewed Diet and exercise encouraged  Follow up plan: 3 months   Mary-Margaret MHassell Done  FNP

## 2018-04-02 NOTE — Patient Instructions (Signed)

## 2018-04-03 LAB — CBC WITH DIFFERENTIAL/PLATELET
Basophils Absolute: 0.1 10*3/uL (ref 0.0–0.2)
Basos: 1 %
EOS (ABSOLUTE): 0.4 10*3/uL (ref 0.0–0.4)
Eos: 5 %
Hematocrit: 36.2 % (ref 34.0–46.6)
Hemoglobin: 12 g/dL (ref 11.1–15.9)
IMMATURE GRANS (ABS): 0 10*3/uL (ref 0.0–0.1)
IMMATURE GRANULOCYTES: 0 %
LYMPHS: 29 %
Lymphocytes Absolute: 2 10*3/uL (ref 0.7–3.1)
MCH: 28.6 pg (ref 26.6–33.0)
MCHC: 33.1 g/dL (ref 31.5–35.7)
MCV: 86 fL (ref 79–97)
MONOS ABS: 0.5 10*3/uL (ref 0.1–0.9)
Monocytes: 8 %
NEUTROS PCT: 57 %
Neutrophils Absolute: 3.9 10*3/uL (ref 1.4–7.0)
Platelets: 294 10*3/uL (ref 150–450)
RBC: 4.2 x10E6/uL (ref 3.77–5.28)
RDW: 13.5 % (ref 12.3–15.4)
WBC: 6.8 10*3/uL (ref 3.4–10.8)

## 2018-04-03 LAB — CMP14+EGFR
ALK PHOS: 102 IU/L (ref 39–117)
ALT: 9 IU/L (ref 0–32)
AST: 15 IU/L (ref 0–40)
Albumin/Globulin Ratio: 1.6 (ref 1.2–2.2)
Albumin: 4.2 g/dL (ref 3.5–4.8)
BUN / CREAT RATIO: 18 (ref 12–28)
BUN: 14 mg/dL (ref 8–27)
Bilirubin Total: 0.4 mg/dL (ref 0.0–1.2)
CO2: 27 mmol/L (ref 20–29)
Calcium: 9.3 mg/dL (ref 8.7–10.3)
Chloride: 102 mmol/L (ref 96–106)
Creatinine, Ser: 0.77 mg/dL (ref 0.57–1.00)
GFR calc Af Amer: 89 mL/min/{1.73_m2} (ref >59)
GFR calc non Af Amer: 77 mL/min/{1.73_m2} (ref >59)
GLOBULIN, TOTAL: 2.7 g/dL (ref 1.5–4.5)
Glucose: 93 mg/dL (ref 65–99)
POTASSIUM: 4.5 mmol/L (ref 3.5–5.2)
SODIUM: 142 mmol/L (ref 134–144)
Total Protein: 6.9 g/dL (ref 6.0–8.5)

## 2018-04-03 LAB — LIPID PANEL
CHOL/HDL RATIO: 3.3 ratio (ref 0.0–4.4)
Cholesterol, Total: 219 mg/dL — ABNORMAL HIGH (ref 100–199)
HDL: 66 mg/dL (ref >39)
LDL Calculated: 137 mg/dL — ABNORMAL HIGH (ref 0–99)
TRIGLYCERIDES: 80 mg/dL (ref 0–149)
VLDL Cholesterol Cal: 16 mg/dL (ref 5–40)

## 2018-04-06 ENCOUNTER — Other Ambulatory Visit: Payer: Self-pay | Admitting: Nurse Practitioner

## 2018-04-06 DIAGNOSIS — F5101 Primary insomnia: Secondary | ICD-10-CM

## 2018-04-06 NOTE — Telephone Encounter (Signed)
Last seen 04/02/18  MMM 

## 2018-04-17 DIAGNOSIS — Z1231 Encounter for screening mammogram for malignant neoplasm of breast: Secondary | ICD-10-CM | POA: Diagnosis not present

## 2018-04-17 DIAGNOSIS — R928 Other abnormal and inconclusive findings on diagnostic imaging of breast: Secondary | ICD-10-CM | POA: Diagnosis not present

## 2018-04-19 DIAGNOSIS — M25562 Pain in left knee: Secondary | ICD-10-CM | POA: Diagnosis not present

## 2018-04-19 DIAGNOSIS — M1712 Unilateral primary osteoarthritis, left knee: Secondary | ICD-10-CM | POA: Diagnosis not present

## 2018-04-20 ENCOUNTER — Other Ambulatory Visit: Payer: Self-pay | Admitting: Nurse Practitioner

## 2018-04-20 ENCOUNTER — Other Ambulatory Visit: Payer: Self-pay

## 2018-04-20 DIAGNOSIS — Z1239 Encounter for other screening for malignant neoplasm of breast: Secondary | ICD-10-CM

## 2018-04-20 DIAGNOSIS — N6310 Unspecified lump in the right breast, unspecified quadrant: Secondary | ICD-10-CM

## 2018-04-25 DIAGNOSIS — R928 Other abnormal and inconclusive findings on diagnostic imaging of breast: Secondary | ICD-10-CM | POA: Diagnosis not present

## 2018-04-25 DIAGNOSIS — N6310 Unspecified lump in the right breast, unspecified quadrant: Secondary | ICD-10-CM | POA: Diagnosis not present

## 2018-04-25 DIAGNOSIS — C50911 Malignant neoplasm of unspecified site of right female breast: Secondary | ICD-10-CM | POA: Diagnosis not present

## 2018-04-25 DIAGNOSIS — R922 Inconclusive mammogram: Secondary | ICD-10-CM | POA: Diagnosis not present

## 2018-04-25 DIAGNOSIS — N6312 Unspecified lump in the right breast, upper inner quadrant: Secondary | ICD-10-CM | POA: Diagnosis not present

## 2018-04-30 ENCOUNTER — Other Ambulatory Visit: Payer: Self-pay

## 2018-04-30 ENCOUNTER — Ambulatory Visit (INDEPENDENT_AMBULATORY_CARE_PROVIDER_SITE_OTHER): Payer: Medicare HMO

## 2018-04-30 DIAGNOSIS — E538 Deficiency of other specified B group vitamins: Secondary | ICD-10-CM

## 2018-04-30 DIAGNOSIS — C44511 Basal cell carcinoma of skin of breast: Secondary | ICD-10-CM

## 2018-04-30 MED ORDER — CYANOCOBALAMIN 1000 MCG/ML IJ SOLN
1000.0000 ug | INTRAMUSCULAR | Status: AC
Start: 2018-04-30 — End: 2026-06-17
  Administered 2018-04-30 – 2024-09-26 (×76): 1000 ug via INTRAMUSCULAR

## 2018-04-30 NOTE — Patient Instructions (Signed)
Cyanocobalamin, Vitamin B12 injection What is this medicine? CYANOCOBALAMIN (sye an oh koe BAL a min) is a man made form of vitamin B12. Vitamin B12 is used in the growth of healthy blood cells, nerve cells, and proteins in the body. It also helps with the metabolism of fats and carbohydrates. This medicine is used to treat people who can not absorb vitamin B12. This medicine may be used for other purposes; ask your health care provider or pharmacist if you have questions. COMMON BRAND NAME(S): B-12 Compliance Kit, B-12 Injection Kit, Cyomin, LA-12, Nutri-Twelve, Physicians EZ Use B-12, Primabalt What should I tell my health care provider before I take this medicine? They need to know if you have any of these conditions: -kidney disease -Leber's disease -megaloblastic anemia -an unusual or allergic reaction to cyanocobalamin, cobalt, other medicines, foods, dyes, or preservatives -pregnant or trying to get pregnant -breast-feeding How should I use this medicine? This medicine is injected into a muscle or deeply under the skin. It is usually given by a health care professional in a clinic or doctor's office. However, your doctor may teach you how to inject yourself. Follow all instructions. Talk to your pediatrician regarding the use of this medicine in children. Special care may be needed. Overdosage: If you think you have taken too much of this medicine contact a poison control center or emergency room at once. NOTE: This medicine is only for you. Do not share this medicine with others. What if I miss a dose? If you are given your dose at a clinic or doctor's office, call to reschedule your appointment. If you give your own injections and you miss a dose, take it as soon as you can. If it is almost time for your next dose, take only that dose. Do not take double or extra doses. What may interact with this medicine? -colchicine -heavy alcohol intake This list may not describe all possible  interactions. Give your health care provider a list of all the medicines, herbs, non-prescription drugs, or dietary supplements you use. Also tell them if you smoke, drink alcohol, or use illegal drugs. Some items may interact with your medicine. What should I watch for while using this medicine? Visit your doctor or health care professional regularly. You may need blood work done while you are taking this medicine. You may need to follow a special diet. Talk to your doctor. Limit your alcohol intake and avoid smoking to get the best benefit. What side effects may I notice from receiving this medicine? Side effects that you should report to your doctor or health care professional as soon as possible: -allergic reactions like skin rash, itching or hives, swelling of the face, lips, or tongue -blue tint to skin -chest tightness, pain -difficulty breathing, wheezing -dizziness -red, swollen painful area on the leg Side effects that usually do not require medical attention (report to your doctor or health care professional if they continue or are bothersome): -diarrhea -headache This list may not describe all possible side effects. Call your doctor for medical advice about side effects. You may report side effects to FDA at 1-800-FDA-1088. Where should I keep my medicine? Keep out of the reach of children. Store at room temperature between 15 and 30 degrees C (59 and 85 degrees F). Protect from light. Throw away any unused medicine after the expiration date. NOTE: This sheet is a summary. It may not cover all possible information. If you have questions about this medicine, talk to your doctor, pharmacist, or   health care provider.  2018 Elsevier/Gold Standard (2007-12-10 22:10:20)  

## 2018-04-30 NOTE — Progress Notes (Signed)
Cyanocobalamin injection given to left deltoid, patient tolerated well. 

## 2018-05-08 ENCOUNTER — Telehealth: Payer: Self-pay | Admitting: Nurse Practitioner

## 2018-05-09 NOTE — Telephone Encounter (Signed)
Faxed

## 2018-05-10 ENCOUNTER — Other Ambulatory Visit: Payer: Self-pay

## 2018-05-10 DIAGNOSIS — C50911 Malignant neoplasm of unspecified site of right female breast: Secondary | ICD-10-CM | POA: Diagnosis not present

## 2018-05-10 DIAGNOSIS — Z170 Estrogen receptor positive status [ER+]: Secondary | ICD-10-CM | POA: Diagnosis not present

## 2018-05-16 DIAGNOSIS — R69 Illness, unspecified: Secondary | ICD-10-CM | POA: Diagnosis not present

## 2018-05-17 ENCOUNTER — Telehealth: Payer: Self-pay | Admitting: Hematology and Oncology

## 2018-05-17 ENCOUNTER — Other Ambulatory Visit: Payer: Self-pay | Admitting: Surgery

## 2018-05-17 ENCOUNTER — Encounter: Payer: Self-pay | Admitting: Hematology and Oncology

## 2018-05-17 DIAGNOSIS — Z170 Estrogen receptor positive status [ER+]: Principal | ICD-10-CM

## 2018-05-17 DIAGNOSIS — C50911 Malignant neoplasm of unspecified site of right female breast: Secondary | ICD-10-CM

## 2018-05-17 NOTE — Telephone Encounter (Signed)
New referral received from Dr. Alphonsa Overall at [facility] for breast cancer. Pt has been cld and scheduled to see Dr. Lindi Adie on 9/19 at 1pm. Pt aware to arrive 30 minutes early. Address verified. Letter mailed.

## 2018-05-26 ENCOUNTER — Ambulatory Visit
Admission: RE | Admit: 2018-05-26 | Discharge: 2018-05-26 | Disposition: A | Discharge status: Home / Self Care | Payer: PPO | Source: Ambulatory Visit | Attending: Surgery | Admitting: Surgery

## 2018-05-26 DIAGNOSIS — Z170 Estrogen receptor positive status [ER+]: Principal | ICD-10-CM

## 2018-05-26 DIAGNOSIS — C50911 Malignant neoplasm of unspecified site of right female breast: Secondary | ICD-10-CM

## 2018-05-26 DIAGNOSIS — D0511 Intraductal carcinoma in situ of right breast: Secondary | ICD-10-CM | POA: Diagnosis not present

## 2018-05-26 MED ORDER — GADOBENATE DIMEGLUMINE 529 MG/ML IV SOLN
16.0000 mL | Freq: Once | INTRAVENOUS | Status: AC | PRN
  Administered 2018-05-26: 16 mL via INTRAVENOUS

## 2018-05-29 ENCOUNTER — Ambulatory Visit (INDEPENDENT_AMBULATORY_CARE_PROVIDER_SITE_OTHER): Payer: Medicare HMO | Admitting: *Deleted

## 2018-05-29 DIAGNOSIS — E538 Deficiency of other specified B group vitamins: Secondary | ICD-10-CM | POA: Diagnosis not present

## 2018-05-29 NOTE — Progress Notes (Signed)
Pt given Cyanocobalamin inj Tolerated well 

## 2018-05-30 ENCOUNTER — Other Ambulatory Visit: Payer: Self-pay | Admitting: Surgery

## 2018-05-30 ENCOUNTER — Encounter: Payer: Self-pay | Admitting: Radiation Oncology

## 2018-05-30 DIAGNOSIS — Z170 Estrogen receptor positive status [ER+]: Secondary | ICD-10-CM

## 2018-05-30 DIAGNOSIS — C50211 Malignant neoplasm of upper-inner quadrant of right female breast: Secondary | ICD-10-CM

## 2018-05-31 ENCOUNTER — Inpatient Hospital Stay: Payer: Medicare HMO | Attending: Hematology and Oncology | Admitting: Hematology and Oncology

## 2018-05-31 ENCOUNTER — Encounter: Payer: Self-pay | Admitting: *Deleted

## 2018-05-31 DIAGNOSIS — F329 Major depressive disorder, single episode, unspecified: Secondary | ICD-10-CM | POA: Diagnosis not present

## 2018-05-31 DIAGNOSIS — F419 Anxiety disorder, unspecified: Secondary | ICD-10-CM | POA: Insufficient documentation

## 2018-05-31 DIAGNOSIS — G629 Polyneuropathy, unspecified: Secondary | ICD-10-CM | POA: Diagnosis not present

## 2018-05-31 DIAGNOSIS — R69 Illness, unspecified: Secondary | ICD-10-CM | POA: Diagnosis not present

## 2018-05-31 DIAGNOSIS — C50211 Malignant neoplasm of upper-inner quadrant of right female breast: Secondary | ICD-10-CM | POA: Diagnosis not present

## 2018-05-31 DIAGNOSIS — E039 Hypothyroidism, unspecified: Secondary | ICD-10-CM | POA: Diagnosis not present

## 2018-05-31 DIAGNOSIS — Z170 Estrogen receptor positive status [ER+]: Secondary | ICD-10-CM | POA: Diagnosis not present

## 2018-05-31 DIAGNOSIS — I10 Essential (primary) hypertension: Secondary | ICD-10-CM | POA: Diagnosis not present

## 2018-05-31 DIAGNOSIS — Z87891 Personal history of nicotine dependence: Secondary | ICD-10-CM | POA: Insufficient documentation

## 2018-05-31 MED ORDER — LETROZOLE 2.5 MG PO TABS: 2.5000 mg | ORAL_TABLET | Freq: Every day | ORAL | 3 refills | Status: DC

## 2018-05-31 NOTE — Assessment & Plan Note (Signed)
04/25/2018: Abnormal mammogram on 04/17/2018, biopsy revealed invasive lobular cancer grade 2, ER 90%, PR 0%, Ki-67 10%, HER-2 negative, breast MRI revealed 8 mm biopsy-proven malignancy UIQ, additional findings 1.2 cm linear enhancement retroareolar, 8 mm enhancing mass posterior upper central right breast, 1.3 cm linear enhancement upper inner left breast  Pathology and radiology counseling: Discussed with the patient, the details of pathology including the type of breast cancer,the clinical staging, the significance of ER, PR and HER-2/neu receptors and the implications for treatment. After reviewing the pathology in detail, we proceeded to discuss the different treatment options between surgery, radiation, chemotherapy, antiestrogen therapies.  Recommendation: 1.  MRI guided biopsies of the additional findings on the MRI 2. surgery with sentinel lymph node biopsy 3.  Oncotype DX testing to determine if she would benefit from chemotherapy 4.  Adjuvant radiation therapy if she undergoes breast conserving surgery 5.  Followed by adjuvant antiestrogen therapy  Return to clinic after surgery to discuss final pathology report and to determine the treatment plan.

## 2018-05-31 NOTE — Progress Notes (Signed)
[facility] NOTE  Patient Care Team: Chevis Pretty, FNP as PCP - General (Nurse Practitioner) Vickey Huger, MD as Consulting Physician (Orthopedic Surgery) Huel Cote, NP as Nurse Practitioner (Obstetrics and Gynecology)  CHIEF COMPLAINTS/PURPOSE OF CONSULTATION:  Newly diagnosed breast cancer  HISTORY OF PRESENTING ILLNESS:  Phyllis Walker 72 y.o. female is here because of recent diagnosis of right breast cancer.  Patient had an abnormal screening mammogram which led to additional mammograms and ultrasounds and biopsies.  The mammogram and ultrasound revealed 8 mm lesion in the left breast.  Biopsy came back as invasive lobular cancer that was ER positive PR negative and HER-2 negative.  There was grade 2 with a Ki-67 of 10%.  She then underwent a breast MRI which revealed 2 more additional abnormalities in the right breast and one new abnormality in the left breast.  She is being set up for an MRI guided biopsy.  B12 she was sent to [place] for further discussion regarding adjuvant treatment options.  She denies any pain or discomfort in the breast.  She has both take it to go and enjoyed a cruise trip in October.  She does not plan to do surgery until she comes back from it.  I reviewed her records extensively and collaborated the history with the patient.  SUMMARY OF ONCOLOGIC HISTORY:   Malignant neoplasm of upper-inner quadrant of right breast in female, estrogen receptor positive ([place])   04/25/2018 Initial Diagnosis    Abnormal mammogram on 04/17/2018, biopsy revealed invasive lobular cancer grade 2, ER 90%, PR 0%, Ki-67 10%, HER-2 negative, breast MRI revealed 8 mm biopsy-proven malignancy UIQ, additional findings 1.2 cm linear enhancement retroareolar, 8 mm enhancing mass posterior upper central right breast, 1.3 cm linear enhancement upper inner left breast, no abnormal lymph nodes, T1 b N0 stage Ia    05/31/2018 Cancer Staging    Staging form: Breast, AJCC  8th Edition - Clinical: Stage IA (cT1b(3), cN0, cM0, G2, ER+, PR-, HER2-) - Signed by Nicholas Lose, MD on 05/31/2018     MEDICAL HISTORY:  Past Medical History:  Diagnosis Date  . Anemia   . Anxiety   . Arthritis   . Cataract   . Depression   . Fibroid tumor 1985   in breast  . History of hiatal hernia   . Hypercholesteremia   . Hypertension   . Hypothyroidism   . Neuropathy    in feet  . Restless leg syndrome     SURGICAL HISTORY: Past Surgical History:  Procedure Laterality Date  . BREAST LUMPECTOMY  1987   benign fibroid tumor  . BUNIONECTOMY Bilateral   . CATARACT EXTRACTION    . COLONOSCOPY    . THYROIDECTOMY, PARTIAL  1969  . TONSILLECTOMY    . TOTAL KNEE ARTHROPLASTY Right 08/15/2016   Procedure: TOTAL KNEE ARTHROPLASTY;  Surgeon: Vickey Huger, MD;  Location: [facility];  Service: Orthopedics;  Laterality: Right;  RNFA    SOCIAL HISTORY: Social History   Socioeconomic History  . Marital status: Divorced    Spouse name: Not on file  . Number of children: 2  . Years of education: 56  . Highest education level: Not on file  Occupational History  . Not on file  Social Needs  . Financial resource strain: Not hard at all  . Food insecurity:    Worry: Never true    Inability: Never true  . Transportation needs:    Medical: No    Non-medical: No  Tobacco Use  . Smoking status: Former Smoker    Types: Cigarettes    Last attempt to quit: 12/21/2006    Years since quitting: 11.4  . Smokeless tobacco: Never Used  Substance and Sexual Activity  . Alcohol use: No  . Drug use: No  . Sexual activity: Not Currently  Lifestyle  . Physical activity:    Days per week: 2 days    Minutes per session: 60 min  . Stress: Not at all  Relationships  . Social connections:    Talks on phone: More than three times a week    Gets together: More than three times a week    Attends religious service: More than 4 times per year    Active member of club or organization: No     Attends meetings of clubs or organizations: Never    Relationship status: Divorced  . Intimate partner violence:    Fear of current or ex partner: No    Emotionally abused: No    Physically abused: No    Forced sexual activity: No  Other Topics Concern  . Not on file  Social History Narrative  . Not on file    FAMILY HISTORY: Family History  Problem Relation Age of Onset  . Cancer Mother        pancreas  . Heart attack Father   . Hypertension Sister   . Hyperlipidemia Sister   . Hypertension Sister   . Hyperlipidemia Sister   . Early death Sister 44       accident  . Ovarian cancer Maternal Aunt     ALLERGIES:  is allergic to amoxicillin-pot clavulanate.  MEDICATIONS:  Current Outpatient Medications  Medication Sig Dispense Refill  . ALPRAZolam (XANAX) 0.25 MG tablet TAKE (1) TABLET TWICE A DAY AS NEEDED. 60 tablet 2  . benazepril-hydrochlorthiazide (LOTENSIN HCT) 20-12.5 MG tablet Take 1 tablet by mouth daily. (Patient taking differently: Take 0.5 tablets by mouth daily. ) 90 tablet 1  . gabapentin (NEURONTIN) 100 MG capsule TAKE (1) CAPSULE THREE TIMES DAILY. 90 capsule 5  . levothyroxine (SYNTHROID, LEVOTHROID) 75 MCG tablet Take 1 tablet (75 mcg total) by mouth daily. 90 tablet 1  . Omega-3 Fatty Acids (FISH OIL) 1200 MG CAPS Take 1,200 mg by mouth daily.    . Red Yeast Rice 600 MG CAPS Take by mouth daily.    [name] rOPINIRole (REQUIP) 0.25 MG tablet TAKE  (1)  TABLET  THREE TIMES DAILY. 270 tablet 1  . sertraline (ZOLOFT) 50 MG tablet Take 1 tablet by mouth  daily 90 tablet 1  . zolpidem (AMBIEN) 10 MG tablet TAKE 1 TABLET AT BEDTIME AS NEEDED FOR SLEEP 30 tablet 2   Current Facility-Administered Medications  Medication Dose Route Frequency Provider Last Rate Last Dose  . cyanocobalamin ((VITAMIN B-12)) injection 1,000 mcg  1,000 mcg Intramuscular Q30 days Chevis Pretty, FNP   1,000 mcg at 05/29/18 0911    REVIEW OF SYSTEMS:   Constitutional: Denies fevers,  chills or abnormal night sweats Eyes: Denies blurriness of vision, double vision or watery eyes Ears, nose, mouth, throat, and face: Denies mucositis or sore throat Respiratory: Denies cough, dyspnea or wheezes Cardiovascular: Denies palpitation, chest discomfort or lower extremity swelling Gastrointestinal:  Denies nausea, heartburn or change in bowel habits Skin: Denies abnormal skin rashes Lymphatics: Denies new lymphadenopathy or easy bruising Neurological:Denies numbness, tingling or new weaknesses Behavioral/Psych: Mood is stable, no new changes  Breast:  Denies any palpable lumps or discharge All  other systems were reviewed with the patient and are negative.  PHYSICAL EXAMINATION: ECOG PERFORMANCE STATUS: 1 - Symptomatic but completely ambulatory  Vitals:   05/31/18 1250  BP: 132/64  Pulse: 83  Resp: 17  Temp: 97.8 F (36.6 C)  SpO2: 98%   Filed Weights   05/31/18 1250  Weight: 178 lb 6.4 oz (80.9 kg)    GENERAL:alert, no distress and comfortable SKIN: skin color, texture, turgor are normal, no rashes or significant lesions EYES: normal, conjunctiva are pink and non-injected, sclera clear OROPHARYNX:no exudate, no erythema and lips, buccal mucosa, and tongue normal  NECK: supple, thyroid normal size, non-tender, without nodularity LYMPH:  no palpable lymphadenopathy in the cervical, axillary or inguinal LUNGS: clear to auscultation and percussion with normal breathing effort HEART: regular rate & rhythm and no murmurs and no lower extremity edema ABDOMEN:abdomen soft, non-tender and normal bowel sounds Musculoskeletal:no cyanosis of digits and no clubbing  PSYCH: alert & oriented x 3 with fluent speech NEURO: no focal motor/sensory deficits BREAST: No palpable nodules in breast. No palpable axillary or supraclavicular lymphadenopathy (exam performed in the presence of a chaperone)   LABORATORY DATA:  I have reviewed the data as listed Lab Results  Component Value  Date   WBC 6.8 04/02/2018   HGB 12.0 04/02/2018   HCT 36.2 04/02/2018   MCV 86 04/02/2018   PLT 294 04/02/2018   Lab Results  Component Value Date   NA 142 04/02/2018   K 4.5 04/02/2018   CL 102 04/02/2018   CO2 27 04/02/2018    RADIOGRAPHIC STUDIES: I have personally reviewed the radiological reports and agreed with the findings in the report.  ASSESSMENT AND PLAN:  Malignant neoplasm of upper-inner quadrant of right breast in female, estrogen receptor positive ([place]) 04/25/2018: Abnormal mammogram on 04/17/2018, biopsy revealed invasive lobular cancer grade 2, ER 90%, PR 0%, Ki-67 10%, HER-2 negative, breast MRI revealed 8 mm biopsy-proven malignancy UIQ, additional findings 1.2 cm linear enhancement retroareolar, 8 mm enhancing mass posterior upper central right breast, 1.3 cm linear enhancement upper inner left breast  Pathology and radiology counseling: Discussed with the patient, the details of pathology including the type of breast cancer,the clinical staging, the significance of ER, PR and HER-2/neu receptors and the implications for treatment. After reviewing the pathology in detail, we proceeded to discuss the different treatment options between surgery, radiation, chemotherapy, antiestrogen therapies.  Recommendation: 1.  MRI guided biopsies of the additional findings on the MRI 2. surgery with sentinel lymph node biopsy 3.  Oncotype DX testing to determine if she would benefit from chemotherapy 4.  Adjuvant radiation therapy if she undergoes breast conserving surgery 5.  Followed by adjuvant antiestrogen therapy  Return to clinic after surgery to discuss final pathology report and to determine the treatment plan.   All questions were answered. The patient knows to call the clinic with any problems, questions or concerns.    Harriette Ohara, MD 05/31/18

## 2018-06-06 ENCOUNTER — Ambulatory Visit
Admission: RE | Admit: 2018-06-06 | Discharge: 2018-06-06 | Disposition: A | Discharge status: Home / Self Care | Payer: Medicare HMO | Source: Ambulatory Visit | Attending: Surgery | Admitting: Surgery

## 2018-06-06 ENCOUNTER — Ambulatory Visit
Admission: RE | Admit: 2018-06-06 | Discharge: 2018-06-06 | Disposition: A | Discharge status: Home / Self Care | Payer: PPO | Source: Ambulatory Visit | Attending: Surgery | Admitting: Surgery

## 2018-06-06 DIAGNOSIS — Z170 Estrogen receptor positive status [ER+]: Secondary | ICD-10-CM

## 2018-06-06 DIAGNOSIS — C50211 Malignant neoplasm of upper-inner quadrant of right female breast: Secondary | ICD-10-CM

## 2018-06-06 DIAGNOSIS — N6012 Diffuse cystic mastopathy of left breast: Secondary | ICD-10-CM | POA: Diagnosis not present

## 2018-06-06 DIAGNOSIS — N6011 Diffuse cystic mastopathy of right breast: Secondary | ICD-10-CM | POA: Diagnosis not present

## 2018-06-06 DIAGNOSIS — N6489 Other specified disorders of breast: Secondary | ICD-10-CM | POA: Diagnosis not present

## 2018-06-06 MED ORDER — GADOBENATE DIMEGLUMINE 529 MG/ML IV SOLN
16.0000 mL | Freq: Once | INTRAVENOUS | Status: AC | PRN
  Administered 2018-06-06: 16 mL via INTRAVENOUS

## 2018-06-07 ENCOUNTER — Other Ambulatory Visit (HOSPITAL_COMMUNITY): Payer: Self-pay | Admitting: Surgery

## 2018-06-07 DIAGNOSIS — Z170 Estrogen receptor positive status [ER+]: Principal | ICD-10-CM

## 2018-06-07 DIAGNOSIS — C50911 Malignant neoplasm of unspecified site of right female breast: Secondary | ICD-10-CM

## 2018-06-07 DIAGNOSIS — N649 Disorder of breast, unspecified: Secondary | ICD-10-CM

## 2018-06-11 NOTE — Progress Notes (Signed)
Location of Breast Cancer: Malignant neoplasm of upper inner quadrant of right breast in female, estrogen receptor positive.  Did patient present with symptoms (if so, please note symptoms) or was this found on screening mammography?: Pt had an abnormal screening mammogram which in turn led to additional mammograms, ultrasounds, and biopsies.  MR Bilateral Breast 05/26/2018: 1.2 cm linear enhancement within the retro-areolar right breast, 8 mm enhancing mass within the posterior upper central right breast, and 1.3 cm linear enhancement within the upper inner left breast.  Mammogram 04/17/2018: abnormal, change in the right breast.  Histology per Pathology Report: Right/Left Breast 06/06/2018   Right Breast 04/25/2018     Receptor Status: ER(+), PR (-), Her2-neu (-), Ki-67(10%)  Past/Anticipated interventions by surgeon, if any: Dr. Lucia Gaskins 05/10/2018 -I discussed a multidisciplinary approach to the treatment of breast cancer, which includes medical oncology and radiation oncology.   -I discussed the surgical options of lumpectomy vs mastectomy. -If mastectomy, there is the possibility of reconstruction. -I discussed the options of lymph node biopsy. -The treatment plan depends on the pathologic staging of the tumor and the patient's personal wishes. Plan -MRI of breast -Referral to medical and radiation oncology. -Surgery plans after MRI- she is a candidate for right breast lumpectomy and right axillary SLNBx. - surgery after trip, lymph nodes at the same time. -Lumpectomy will remove both areas in the right breast.  Past/Anticipated interventions by medical oncology, if any: Chemotherapy  Dr. Lindi Adie 05/31/2018 - MRI guided biopsies of the additional findings on the MRI. -Surgery with sentinel lymph node biopsy. -Oncotype DX testing to determine if she would benefit from chemotherapy. -Adjuvant radiation therapy if she undergoes breast conserving surgery. -Followed by adjuvant  antiestrogen therapy. -Return to clinic after surgery to discuss final pathology report and to determine the treatment plan.  Lymphedema issues, if any: No  Pain issues, if any:  No  SAFETY ISSUES:  Prior radiation? No  Pacemaker/ICD? No  Possible current pregnancy? No  Is the patient on methotrexate? No  Current Complaints / other details:   Left Breast Lumpectomy 1987- benign fibroid tumor    Cori Razor, RN 06/11/2018,8:26 AM

## 2018-06-12 ENCOUNTER — Ambulatory Visit
Admission: RE | Admit: 2018-06-12 | Discharge: 2018-06-12 | Disposition: A | Discharge status: Home / Self Care | Payer: Medicare HMO | Source: Ambulatory Visit | Attending: Radiation Oncology | Admitting: Radiation Oncology

## 2018-06-12 ENCOUNTER — Encounter: Payer: Self-pay | Admitting: *Deleted

## 2018-06-12 ENCOUNTER — Other Ambulatory Visit: Payer: Self-pay

## 2018-06-12 ENCOUNTER — Encounter: Payer: Self-pay | Admitting: Radiation Oncology

## 2018-06-12 VITALS — BP 140/72 | HR 70 | Temp 98.2°F | Resp 18 | Ht 64.0 in | Wt 176.2 lb

## 2018-06-12 DIAGNOSIS — Z170 Estrogen receptor positive status [ER+]: Secondary | ICD-10-CM | POA: Insufficient documentation

## 2018-06-12 DIAGNOSIS — G629 Polyneuropathy, unspecified: Secondary | ICD-10-CM | POA: Diagnosis not present

## 2018-06-12 DIAGNOSIS — C50211 Malignant neoplasm of upper-inner quadrant of right female breast: Secondary | ICD-10-CM

## 2018-06-12 DIAGNOSIS — C50911 Malignant neoplasm of unspecified site of right female breast: Secondary | ICD-10-CM

## 2018-06-12 DIAGNOSIS — M1990 Unspecified osteoarthritis, unspecified site: Secondary | ICD-10-CM | POA: Insufficient documentation

## 2018-06-12 DIAGNOSIS — E039 Hypothyroidism, unspecified: Secondary | ICD-10-CM | POA: Insufficient documentation

## 2018-06-12 DIAGNOSIS — I10 Essential (primary) hypertension: Secondary | ICD-10-CM | POA: Diagnosis not present

## 2018-06-12 DIAGNOSIS — Z87891 Personal history of nicotine dependence: Secondary | ICD-10-CM | POA: Diagnosis not present

## 2018-06-12 DIAGNOSIS — Z96651 Presence of right artificial knee joint: Secondary | ICD-10-CM | POA: Diagnosis not present

## 2018-06-12 DIAGNOSIS — Z79899 Other long term (current) drug therapy: Secondary | ICD-10-CM | POA: Insufficient documentation

## 2018-06-12 DIAGNOSIS — N6021 Fibroadenosis of right breast: Secondary | ICD-10-CM

## 2018-06-12 DIAGNOSIS — Z79890 Hormone replacement therapy (postmenopausal): Secondary | ICD-10-CM | POA: Insufficient documentation

## 2018-06-12 HISTORY — DX: Fibroadenosis of right breast: N60.21

## 2018-06-12 HISTORY — DX: Malignant neoplasm of unspecified site of right female breast: C50.911

## 2018-06-12 NOTE — Progress Notes (Addendum)
Radiation Oncology         [phone] ________________________________  Name: Phyllis Walker        MRN: [id number]  Date of Service: 06/12/2018 DOB: [birth year]  NI:[email or web address], Mary-Margaret, FNP  Alphonsa Overall, MD     REFERRING PHYSICIAN: Alphonsa Overall, MD   DIAGNOSIS: The encounter diagnosis was Malignant neoplasm of upper-inner quadrant of right breast in female, estrogen receptor positive ([place]).   HISTORY OF PRESENT ILLNESS: Phyllis Walker is a 72 y.o. female with a new diagnosis of right breast cancer. The patient was noted to have an abnormal mass in the right breast on screening mammogram.  She underwent diagnostic imaging which revealed concerns for an 8 mm lesion in the right breast at 1230.  A biopsy on 04/17/2018 revealed an invasive lobular carcinoma, ER positive, PR and HER-2 negative, this was grade 2 with a Ki-67 of 10%.  She met with Dr. Lucia Gaskins and because of the lobular phenotype underwent bilateral MRI of the breast on 05/26/2018.  This identified 2 additional lesions within the right breast and one new lesion seen in the left breast.  Her left breast was sampled on 06/06/18  revealing PASH and fibrocystic changes, while 1 of the biopsies was negative for malignancy in the right breast, another was notable for complex sclerosing lesion.  She is planning to go on a vacation in the coming weeks, and has begun letrozole and in lieu of this.  She is planning to proceed with a double lumpectomy on the right breast with sentinel lymph node biopsy towards the end of this month when she returns.  She comes today to discuss options of adjuvant therapy.   PREVIOUS RADIATION THERAPY: No   PAST MEDICAL HISTORY:  Past Medical History:  Diagnosis Date  . Anemia   . Anxiety   . Arthritis   . Cataract   . Depression   . Fibroid tumor 1985   in breast  . History of hiatal hernia   . Hypercholesteremia   . Hypertension   . Hypothyroidism   . Neuropathy    in feet  . Restless leg syndrome         PAST SURGICAL HISTORY: Past Surgical History:  Procedure Laterality Date  . BREAST LUMPECTOMY  1987   benign fibroid tumor  . BUNIONECTOMY Bilateral   . CATARACT EXTRACTION    . COLONOSCOPY    . THYROIDECTOMY, PARTIAL  1969  . TONSILLECTOMY    . TOTAL KNEE ARTHROPLASTY Right 08/15/2016   Procedure: TOTAL KNEE ARTHROPLASTY;  Surgeon: Vickey Huger, MD;  Location: [facility];  Service: Orthopedics;  Laterality: Right;  RNFA     FAMILY HISTORY:  Family History  Problem Relation Age of Onset  . Cancer Mother        pancreas  . Heart attack Father   . Hypertension Sister   . Hyperlipidemia Sister   . Anuerysm Sister   . Hypertension Sister   . Hyperlipidemia Sister   . Early death Sister 44       accident  . Ovarian cancer Maternal Aunt      SOCIAL HISTORY:  reports that she quit smoking about 11 years ago. Her smoking use included cigarettes. She has never used smokeless tobacco. She reports that she does not drink alcohol or use drugs.   ALLERGIES: Amoxicillin-pot clavulanate   MEDICATIONS:  Current Outpatient Medications  Medication Sig Dispense Refill  . ALPRAZolam (XANAX) 0.25 MG tablet TAKE (1) TABLET TWICE A DAY AS NEEDED.  60 tablet 2  . benazepril-hydrochlorthiazide (LOTENSIN HCT) 20-12.5 MG tablet Take 1 tablet by mouth daily. (Patient taking differently: Take 0.5 tablets by mouth daily. ) 90 tablet 1  . gabapentin (NEURONTIN) 100 MG capsule TAKE (1) CAPSULE THREE TIMES DAILY. 90 capsule 5  . letrozole (FEMARA) 2.5 MG tablet Take 1 tablet (2.5 mg total) by mouth daily. 90 tablet 3  . levothyroxine (SYNTHROID, LEVOTHROID) 75 MCG tablet Take 1 tablet (75 mcg total) by mouth daily. 90 tablet 1  . Omega-3 Fatty Acids (FISH OIL) 1200 MG CAPS Take 1,200 mg by mouth daily.    . Red Yeast Rice 600 MG CAPS Take by mouth daily.    [name] rOPINIRole (REQUIP) 0.25 MG tablet TAKE  (1)  TABLET  THREE TIMES DAILY. 270 tablet 1  . sertraline (ZOLOFT) 50 MG tablet Take 1 tablet by  mouth  daily 90 tablet 1  . zolpidem (AMBIEN) 10 MG tablet TAKE 1 TABLET AT BEDTIME AS NEEDED FOR SLEEP 30 tablet 2   Current Facility-Administered Medications  Medication Dose Route Frequency Provider Last Rate Last Dose  . cyanocobalamin ((VITAMIN B-12)) injection 1,000 mcg  1,000 mcg Intramuscular Q30 days Chevis Pretty, FNP   1,000 mcg at 05/29/18 6553     REVIEW OF SYSTEMS: On review of systems, the patient reports that she is doing well overall. She denies any chest pain, shortness of breath, cough, fevers, chills, night sweats, unintended weight changes. She denies any bowel or bladder disturbances, and denies abdominal pain, nausea or vomiting. She denies any new musculoskeletal or joint aches or pains. A complete review of systems is obtained and is otherwise negative.     PHYSICAL EXAM:  Wt Readings from Last 3 Encounters:  06/12/18 176 lb 3.2 oz (79.9 kg)  05/31/18 178 lb 6.4 oz (80.9 kg)  04/02/18 178 lb (80.7 kg)   Temp Readings from Last 3 Encounters:  06/12/18 98.2 F (36.8 C) (Oral)  05/31/18 97.8 F (36.6 C) (Oral)  04/02/18 (!) 97 F (36.1 C) (Oral)   BP Readings from Last 3 Encounters:  06/12/18 140/72  05/31/18 132/64  04/02/18 124/74   Pulse Readings from Last 3 Encounters:  06/12/18 70  05/31/18 83  04/02/18 77     In general this is a well appearing Caucasian female in no acute distress. She is alert and oriented x4 and appropriate throughout the examination. HEENT reveals that the patient is normocephalic, atraumatic. EOMs are intact. Cardiopulmonary assessment is negative for acute distress and she exhibits normal effort.  Breast exam is deferred   ECOG = 0  0 - Asymptomatic (Fully active, able to carry on all predisease activities without restriction)  1 - Symptomatic but completely ambulatory (Restricted in physically strenuous activity but ambulatory and able to carry out work of a light or sedentary nature. For example, light  housework, office work)  2 - Symptomatic, <50% in bed during the day (Ambulatory and capable of all self care but unable to carry out any work activities. Up and about more than 50% of waking hours)  3 - Symptomatic, >50% in bed, but not bedbound (Capable of only limited self-care, confined to bed or chair 50% or more of waking hours)  4 - Bedbound (Completely disabled. Cannot carry on any self-care. Totally confined to bed or chair)  5 - Death   Eustace Pen MM, Creech RH, Tormey DC, et al. [phone]). "Toxicity and response criteria of the [facility]". [place] Oncol. 5 (6): [phone]  LABORATORY DATA:  Lab Results  Component Value Date   WBC 6.8 04/02/2018   HGB 12.0 04/02/2018   HCT 36.2 04/02/2018   MCV 86 04/02/2018   PLT 294 04/02/2018   Lab Results  Component Value Date   NA 142 04/02/2018   K 4.5 04/02/2018   CL 102 04/02/2018   CO2 27 04/02/2018   Lab Results  Component Value Date   ALT 9 04/02/2018   AST 15 04/02/2018   ALKPHOS 102 04/02/2018   BILITOT 0.4 04/02/2018      RADIOGRAPHY: Mr Breast Bilateral W Wo Contrast Inc Cad  Result Date: 05/28/2018 CLINICAL DATA:  72 year old female with recent diagnosis of RIGHT breast invasive mammary carcinoma. LABS:  Creatinine was obtained on site at [facility] at [address] Results: Creatinine-0.8, BUN-11, GFR-74. EXAM: BILATERAL BREAST MRI WITH AND WITHOUT CONTRAST TECHNIQUE: Multiplanar, multisequence MR images of both breasts were obtained prior to and following the intravenous administration of 16 ml of MultiHance. Three-dimensional MR images were rendered by post-processing the original MR data using the DynaCAD thin client. The 3D MR images are interpreted and the findings are included in the complete MRI report below. COMPARISON:  Previous mammograms and ultrasounds. FINDINGS: Breast composition: c. Heterogeneous fibroglandular tissue. Background parenchymal enhancement: Moderate  Right breast: Biopsy clip artifact and surrounding 8 mm enhancement within the UPPER slightly INNER RIGHT breast (junction of middle and posterior thirds) is compatible with biopsy-proven malignancy (series 6: Image 53). A 1.2 cm area of linear enhancement in the RETROAREOLAR breast directly behind the nipple is suspicious (6: 99-101). A 5 x 5 x 8 mm enhancing mass within the posterior UPPER central RIGHT breast is indeterminate and most prominent on the sagittal/coronal reconstructions. (6:76). Other scattered foci throughout the RIGHT breast have a similar appearance. Left breast: A 1.3 cm area of linear enhancement within the UPPER INNER LEFT breast (middle 3rd) is suspicious (6: 62-65). Scattered foci within the LEFT breast appear similar. Lymph nodes: No abnormal appearing lymph nodes. Ancillary findings:  None. IMPRESSION: 1. 1.2 cm linear enhancement within the RETROAREOLAR RIGHT breast, 8 mm enhancing mass within the posterior UPPER central RIGHT breast, and 1.3 cm linear enhancement within the UPPER INNER LEFT breast. All 3 of these areas are indeterminate/suspicious and MR guided biopsies are recommended if breast conservation is desired. 2. Biopsy-proven 8 mm malignancy within the UPPER INNER RIGHT breast with associated biopsy clip artifact. 3. No abnormal appearing lymph nodes. RECOMMENDATION: MR guided biopsies of 2 RIGHT breast abnormalities and 1 LEFT breast abnormality as described above, if breast conservation is desired. BI-RADS CATEGORY  4: Suspicious. Electronically Signed   By: Margarette Canada M.D.   On: 05/28/2018 11:19   Mm Clip Placement Left  Result Date: 06/06/2018 CLINICAL DATA:  Patient status post MRI guided core needle biopsy left breast mass. EXAM: DIAGNOSTIC LEFT MAMMOGRAM POST MRI BIOPSY COMPARISON:  Previous exam(s). FINDINGS: Mammographic images were obtained following MRI guided biopsy of left breast mass. Dumbbell-shaped marking clip migrated approximately 3 cm lateral to the  site of biopsy. IMPRESSION: Approximate 3 cm lateral migration of the dumbbell-shaped marking clip from the site of MRI guided core needle biopsy. Final Assessment: Post Procedure Mammograms for Marker Placement Electronically Signed   By: Lovey Newcomer M.D.   On: 06/06/2018 09:53   Mm Clip Placement Right  Result Date: 06/06/2018 CLINICAL DATA:  Patient status post MRI guided core needle biopsy 2 sites right breast. EXAM: DIAGNOSTIC RIGHT MAMMOGRAM POST MRI BIOPSY  COMPARISON:  Previous exam(s). FINDINGS: Mammographic images were obtained following MRI guided biopsy of 2 sites right breast. Dumbbell-shaped marking clip within the retroareolar right breast in appropriate position. Cylinder-shaped marking clip within the upper-outer right breast in appropriate position. There is a small surrounding hematoma. IMPRESSION: Site 1: Appropriate position dumbbell-shaped marking clip: Retroareolar right breast. Site 2: Appropriate position cylinder-shaped marking clip: Upper-outer right breast. Final Assessment: Post Procedure Mammograms for Marker Placement Electronically Signed   By: Lovey Newcomer M.D.   On: 06/06/2018 09:58   Mr Aundra Millet Breast Bx Johnella Moloney Dev 1st Lesion Image Bx Spec Mr Guide  Addendum Date: 06/11/2018   ADDENDUM REPORT: 06/07/2018 13:09 ADDENDUM: Pathology revealed COMPLEX SCLEROSING LESION WITH CALCIFICATIONS of RIGHT breast, retroareolar. This was found to be concordant by Dr. Lovey Newcomer with excision recommended. Pathology revealed FIBROCYSTIC AND FIBROADENOMATOID CHANGE WITH CALCIFICATIONS, VASCULAR CALCIFICATIONS of RIGHT breast, superior central. This was found to be concordant by Dr. Lovey Newcomer. 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 This was found to be concordant by Dr. Lovey Newcomer. Pathology results were discussed with the patient by telephone. The patient reported doing well after the biopsy with tenderness at the site. Post  biopsy instructions and care were reviewed and questions were answered. The patient was encouraged to call [facility] for any additional concerns. The patient has a recent diagnosis of RIGHT breast cancer and should follow her outlined treatment plan. I have sent an Epic message to Dr Nicholas Lose of [facility] and Dr Alphonsa Overall of [facility], [place], [place], to inform of the biopsy results. Pathology results reported by Roselind Messier, RN on 06/07/2018. Electronically Signed   By: Lovey Newcomer M.D.   On: 06/07/2018 13:09   Result Date: 06/11/2018 CLINICAL DATA:  Patient with recent diagnosis right breast cancer, for triple MRI guided core needle biopsy, 2 sites right breast and 1 site left breast. EXAM: MRI GUIDED CORE NEEDLE BIOPSY OF THE RIGHT AND LEFT BREAST TECHNIQUE: Multiplanar, multisequence MR imaging of the right and left breast was performed both before and after administration of intravenous contrast. CONTRAST:  15m MULTIHANCE GADOBENATE DIMEGLUMINE 529 MG/ML IV SOLN COMPARISON:  Previous exams. FINDINGS: I met with the patient, and we discussed the procedure of MRI guided biopsy, including risks, benefits, and alternatives. Specifically, we discussed the risks of infection, bleeding, tissue injury, clip migration, and inadequate sampling. Informed, written consent was given. The usual time out protocol was performed immediately prior to the procedure. Site 1: Retroareolar right breast: Dumbbell-shaped marking clip. Using sterile technique, 1% Lidocaine, MRI guidance, and a 9 gauge vacuum assisted device, biopsy was performed of linear non mass enhancement retroareolar right breast using a lateral approach. At the conclusion of the procedure, a dumbbell-shaped tissue marker clip was deployed into the biopsy cavity. Follow-up 2-view mammogram was performed and dictated separately. Site 2: Upper-outer right breast: Cylinder-shaped marking clip.  Using sterile technique, 1% Lidocaine, MRI guidance, and a 9 gauge vacuum assisted device, biopsy was performed of mass within the upper-outer right breast using a lateral approach. At the conclusion of the procedure, a cylinder-shaped tissue marker clip was deployed into the biopsy cavity. Follow-up 2-view mammogram was performed and dictated separately. Site 3: Superior central left breast: Dumbbell-shaped clip. Using sterile technique, 1% Lidocaine, MRI guidance, and a 9 gauge vacuum assisted device, biopsy was performed of mass within the superior central left breast using a lateral approach.  At the conclusion of the procedure, a dumbbell-shaped tissue marker clip was deployed into the biopsy cavity. Follow-up 2-view mammogram was performed and dictated separately. IMPRESSION: MRI guided biopsy of 2 sites right breast and 1 site left breast as above. No apparent complications. Electronically Signed: By: Lovey Newcomer M.D. On: 06/06/2018 10:02   Mr Rt Breast Bx W Loc Dev 1st Lesion Image Bx Spec Mr Guide  Addendum Date: 06/11/2018   ADDENDUM REPORT: 06/07/2018 13:09 ADDENDUM: Pathology revealed COMPLEX SCLEROSING LESION WITH CALCIFICATIONS of RIGHT breast, retroareolar. This was found to be concordant by Dr. Lovey Newcomer with excision recommended. Pathology revealed FIBROCYSTIC AND FIBROADENOMATOID CHANGE WITH CALCIFICATIONS, VASCULAR CALCIFICATIONS of RIGHT breast, superior central. This was found to be concordant by Dr. Lovey Newcomer. 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 This was found to be concordant by Dr. Lovey Newcomer. Pathology results were discussed with the patient by telephone. The patient reported doing well after the biopsy with tenderness at the site. Post biopsy instructions and care were reviewed and questions were answered. The patient was encouraged to call [facility] for any additional concerns.  The patient has a recent diagnosis of RIGHT breast cancer and should follow her outlined treatment plan. I have sent an Epic message to Dr Nicholas Lose of [facility] and Dr Alphonsa Overall of [facility], [place], [place], to inform of the biopsy results. Pathology results reported by Roselind Messier, RN on 06/07/2018. Electronically Signed   By: Lovey Newcomer M.D.   On: 06/07/2018 13:09   Result Date: 06/11/2018 CLINICAL DATA:  Patient with recent diagnosis right breast cancer, for triple MRI guided core needle biopsy, 2 sites right breast and 1 site left breast. EXAM: MRI GUIDED CORE NEEDLE BIOPSY OF THE RIGHT AND LEFT BREAST TECHNIQUE: Multiplanar, multisequence MR imaging of the right and left breast was performed both before and after administration of intravenous contrast. CONTRAST:  54m MULTIHANCE GADOBENATE DIMEGLUMINE 529 MG/ML IV SOLN COMPARISON:  Previous exams. FINDINGS: I met with the patient, and we discussed the procedure of MRI guided biopsy, including risks, benefits, and alternatives. Specifically, we discussed the risks of infection, bleeding, tissue injury, clip migration, and inadequate sampling. Informed, written consent was given. The usual time out protocol was performed immediately prior to the procedure. Site 1: Retroareolar right breast: Dumbbell-shaped marking clip. Using sterile technique, 1% Lidocaine, MRI guidance, and a 9 gauge vacuum assisted device, biopsy was performed of linear non mass enhancement retroareolar right breast using a lateral approach. At the conclusion of the procedure, a dumbbell-shaped tissue marker clip was deployed into the biopsy cavity. Follow-up 2-view mammogram was performed and dictated separately. Site 2: Upper-outer right breast: Cylinder-shaped marking clip. Using sterile technique, 1% Lidocaine, MRI guidance, and a 9 gauge vacuum assisted device, biopsy was performed of mass within the upper-outer right breast using a lateral  approach. At the conclusion of the procedure, a cylinder-shaped tissue marker clip was deployed into the biopsy cavity. Follow-up 2-view mammogram was performed and dictated separately. Site 3: Superior central left breast: Dumbbell-shaped clip. Using sterile technique, 1% Lidocaine, MRI guidance, and a 9 gauge vacuum assisted device, biopsy was performed of mass within the superior central left breast using a lateral approach. At the conclusion of the procedure, a dumbbell-shaped tissue marker clip was deployed into the biopsy cavity. Follow-up 2-view mammogram was performed and dictated separately. IMPRESSION: MRI guided biopsy of 2 sites right breast and  1 site left breast as above. No apparent complications. Electronically Signed: By: Lovey Newcomer M.D. On: 06/06/2018 10:02   Mr Rt Breast Bx W Loc Dev Ea Add Lesion Image Bx Spec Mr Guide  Addendum Date: 06/11/2018   ADDENDUM REPORT: 06/07/2018 13:09 ADDENDUM: Pathology revealed COMPLEX SCLEROSING LESION WITH CALCIFICATIONS of RIGHT breast, retroareolar. This was found to be concordant by Dr. Lovey Newcomer with excision recommended. Pathology revealed FIBROCYSTIC AND FIBROADENOMATOID CHANGE WITH CALCIFICATIONS, VASCULAR CALCIFICATIONS of RIGHT breast, superior central. This was found to be concordant by Dr. Lovey Newcomer. 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 This was found to be concordant by Dr. Lovey Newcomer. Pathology results were discussed with the patient by telephone. The patient reported doing well after the biopsy with tenderness at the site. Post biopsy instructions and care were reviewed and questions were answered. The patient was encouraged to call [facility] for any additional concerns. The patient has a recent diagnosis of RIGHT breast cancer and should follow her outlined treatment plan. I have sent an Epic message to Dr Nicholas Lose of [facility] and Dr Alphonsa Overall of [facility], [place], [place], to inform of the biopsy results. Pathology results reported by Roselind Messier, RN on 06/07/2018. Electronically Signed   By: Lovey Newcomer M.D.   On: 06/07/2018 13:09   Result Date: 06/11/2018 CLINICAL DATA:  Patient with recent diagnosis right breast cancer, for triple MRI guided core needle biopsy, 2 sites right breast and 1 site left breast. EXAM: MRI GUIDED CORE NEEDLE BIOPSY OF THE RIGHT AND LEFT BREAST TECHNIQUE: Multiplanar, multisequence MR imaging of the right and left breast was performed both before and after administration of intravenous contrast. CONTRAST:  108m MULTIHANCE GADOBENATE DIMEGLUMINE 529 MG/ML IV SOLN COMPARISON:  Previous exams. FINDINGS: I met with the patient, and we discussed the procedure of MRI guided biopsy, including risks, benefits, and alternatives. Specifically, we discussed the risks of infection, bleeding, tissue injury, clip migration, and inadequate sampling. Informed, written consent was given. The usual time out protocol was performed immediately prior to the procedure. Site 1: Retroareolar right breast: Dumbbell-shaped marking clip. Using sterile technique, 1% Lidocaine, MRI guidance, and a 9 gauge vacuum assisted device, biopsy was performed of linear non mass enhancement retroareolar right breast using a lateral approach. At the conclusion of the procedure, a dumbbell-shaped tissue marker clip was deployed into the biopsy cavity. Follow-up 2-view mammogram was performed and dictated separately. Site 2: Upper-outer right breast: Cylinder-shaped marking clip. Using sterile technique, 1% Lidocaine, MRI guidance, and a 9 gauge vacuum assisted device, biopsy was performed of mass within the upper-outer right breast using a lateral approach. At the conclusion of the procedure, a cylinder-shaped tissue marker clip was deployed into the biopsy cavity. Follow-up 2-view mammogram was performed and dictated  separately. Site 3: Superior central left breast: Dumbbell-shaped clip. Using sterile technique, 1% Lidocaine, MRI guidance, and a 9 gauge vacuum assisted device, biopsy was performed of mass within the superior central left breast using a lateral approach. At the conclusion of the procedure, a dumbbell-shaped tissue marker clip was deployed into the biopsy cavity. Follow-up 2-view mammogram was performed and dictated separately. IMPRESSION: MRI guided biopsy of 2 sites right breast and 1 site left breast as above. No apparent complications. Electronically Signed: By: DLovey NewcomerM.D. On: 06/06/2018 10:02       IMPRESSION/PLAN: 1. Stage IA, [id number] ER positive invasive lobular  carcinoma of the right breast. Dr. Lisbeth Renshaw discusses the pathology findings and reviews the nature of lobular breast disease.  Her case has previously been discussed in conference, and she is a candidate for double lumpectomy with sentinel lymph node biopsy, and Dr. Lindi Adie is recommended Oncotype testing.  This will help to determine a role for chemotherapy.  Provided that chemotherapy is not indicated, the patient's course would then be followed by external radiotherapy to the breast followed by antiestrogen therapy. We discussed the risks, benefits, short, and long term effects of radiotherapy, and the patient is interested in proceeding. Dr. Lisbeth Renshaw discusses the delivery and logistics of radiotherapy and anticipates a course of 4 or 6 1/2 weeks of radiotherapy. We will see her back about 2 weeks after surgery to discuss the simulation process and anticipate we starting radiotherapy about 4-6 weeks after surgery.  We also did discuss options of treatment closer to home in the evening however at this time the patient is more comfortable keeping her care consolidated in [place] and coming to [place] for all of her treatment.  In a visit lasting 60 minutes, greater than 50% of the time was spent face to face discussing her case, and  coordinating the patient's care.  The above documentation reflects my direct findings during this shared patient visit. Please see the separate note by Dr. Lisbeth Renshaw on this date for the remainder of the patient's plan of care.    Carola Rhine, PAC

## 2018-06-12 NOTE — Addendum Note (Signed)
Encounter addended by: Hayden Pedro, PA-C on: 06/12/2018 12:20 PM  Actions taken: LOS modified

## 2018-06-13 ENCOUNTER — Other Ambulatory Visit (HOSPITAL_COMMUNITY): Payer: Self-pay | Admitting: Surgery

## 2018-06-13 DIAGNOSIS — Z170 Estrogen receptor positive status [ER+]: Principal | ICD-10-CM

## 2018-06-13 DIAGNOSIS — C50911 Malignant neoplasm of unspecified site of right female breast: Secondary | ICD-10-CM

## 2018-06-14 ENCOUNTER — Telehealth: Payer: Self-pay | Admitting: Hematology and Oncology

## 2018-06-14 NOTE — Telephone Encounter (Signed)
Scheduled appt per 10/3 sch message - patient is aware of appt date and time

## 2018-06-28 ENCOUNTER — Encounter (HOSPITAL_BASED_OUTPATIENT_CLINIC_OR_DEPARTMENT_OTHER): Payer: Self-pay | Admitting: *Deleted

## 2018-07-02 ENCOUNTER — Ambulatory Visit (INDEPENDENT_AMBULATORY_CARE_PROVIDER_SITE_OTHER): Payer: Medicare HMO | Admitting: *Deleted

## 2018-07-02 ENCOUNTER — Encounter (HOSPITAL_BASED_OUTPATIENT_CLINIC_OR_DEPARTMENT_OTHER): Payer: Self-pay | Admitting: *Deleted

## 2018-07-02 DIAGNOSIS — E538 Deficiency of other specified B group vitamins: Secondary | ICD-10-CM | POA: Diagnosis not present

## 2018-07-02 NOTE — Progress Notes (Signed)
Pt given cyanocobalamin inj Tolerated well 

## 2018-07-04 ENCOUNTER — Encounter (HOSPITAL_BASED_OUTPATIENT_CLINIC_OR_DEPARTMENT_OTHER)
Admission: RE | Admit: 2018-07-04 | Discharge: 2018-07-04 | Disposition: A | Discharge status: Home / Self Care | Payer: Medicare HMO | Source: Ambulatory Visit | Attending: Surgery | Admitting: Surgery

## 2018-07-04 ENCOUNTER — Ambulatory Visit
Admission: RE | Admit: 2018-07-04 | Discharge: 2018-07-04 | Disposition: A | Discharge status: Home / Self Care | Payer: Medicare HMO | Source: Ambulatory Visit | Attending: Surgery | Admitting: Surgery

## 2018-07-04 DIAGNOSIS — Z96651 Presence of right artificial knee joint: Secondary | ICD-10-CM | POA: Diagnosis not present

## 2018-07-04 DIAGNOSIS — Z170 Estrogen receptor positive status [ER+]: Principal | ICD-10-CM

## 2018-07-04 DIAGNOSIS — F419 Anxiety disorder, unspecified: Secondary | ICD-10-CM | POA: Diagnosis not present

## 2018-07-04 DIAGNOSIS — Z880 Allergy status to penicillin: Secondary | ICD-10-CM | POA: Diagnosis not present

## 2018-07-04 DIAGNOSIS — R69 Illness, unspecified: Secondary | ICD-10-CM | POA: Diagnosis not present

## 2018-07-04 DIAGNOSIS — N6091 Unspecified benign mammary dysplasia of right breast: Secondary | ICD-10-CM | POA: Diagnosis not present

## 2018-07-04 DIAGNOSIS — G5793 Unspecified mononeuropathy of bilateral lower limbs: Secondary | ICD-10-CM | POA: Diagnosis not present

## 2018-07-04 DIAGNOSIS — N649 Disorder of breast, unspecified: Secondary | ICD-10-CM

## 2018-07-04 DIAGNOSIS — C50911 Malignant neoplasm of unspecified site of right female breast: Secondary | ICD-10-CM

## 2018-07-04 DIAGNOSIS — I10 Essential (primary) hypertension: Secondary | ICD-10-CM | POA: Diagnosis not present

## 2018-07-04 DIAGNOSIS — F329 Major depressive disorder, single episode, unspecified: Secondary | ICD-10-CM | POA: Diagnosis not present

## 2018-07-04 DIAGNOSIS — C50811 Malignant neoplasm of overlapping sites of right female breast: Secondary | ICD-10-CM | POA: Diagnosis not present

## 2018-07-04 DIAGNOSIS — N6011 Diffuse cystic mastopathy of right breast: Secondary | ICD-10-CM | POA: Diagnosis not present

## 2018-07-04 DIAGNOSIS — Z79899 Other long term (current) drug therapy: Secondary | ICD-10-CM | POA: Diagnosis not present

## 2018-07-04 DIAGNOSIS — Z87891 Personal history of nicotine dependence: Secondary | ICD-10-CM | POA: Diagnosis not present

## 2018-07-04 DIAGNOSIS — Z8249 Family history of ischemic heart disease and other diseases of the circulatory system: Secondary | ICD-10-CM | POA: Diagnosis not present

## 2018-07-04 DIAGNOSIS — R928 Other abnormal and inconclusive findings on diagnostic imaging of breast: Secondary | ICD-10-CM | POA: Diagnosis not present

## 2018-07-04 DIAGNOSIS — E039 Hypothyroidism, unspecified: Secondary | ICD-10-CM | POA: Diagnosis not present

## 2018-07-04 LAB — BASIC METABOLIC PANEL
Anion gap: 11 (ref 5–15)
BUN: 13 mg/dL (ref 8–23)
CALCIUM: 8.9 mg/dL (ref 8.9–10.3)
CO2: 28 mmol/L (ref 22–32)
Chloride: 104 mmol/L (ref 98–111)
Creatinine, Ser: 0.78 mg/dL (ref 0.44–1.00)
GFR calc Af Amer: >60 mL/min (ref >60)
GLUCOSE: 84 mg/dL (ref 70–99)
Potassium: 3.5 mmol/L (ref 3.5–5.1)
Sodium: 143 mmol/L (ref 135–145)

## 2018-07-04 NOTE — H&P (Signed)
Phyllis Walker  Location: [facility] Patient #: [id number] DOB: 1946-05-20 Divorced / Language: Cleophus Molt / Race: White Female  History of Present Illness   The patient is a 72 year old female who presents with a complaint of breast cancer.  The PCP is Ronnald Collum, FNP [facility])  She comes with her daugheter, Adair Patter. (She works with [employer])  She had an abnormal mammogram on 04/17/2018 that showed a change in the right breast.  She had a biopsy of a 0.8 cm mass in the 12:30 o'clock position of the right breast on 04/25/2018 in [place] at [facility]. 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 She is not on hormones. She had a left breast biopsy years ago for benign disease.  I discussed the options for breast cancer treatment with the patient. I discussed a multidisciplinary approach to the treatment of breast cancer, which includes medical oncology and radiation oncology. I discussed the surgical options of lumpectomy vs. mastectomy. If mastectomy, there is the possibility of reconstruction. I discussed the options of lymph node biopsy. The treatment plan depends on the pathologic staging of the tumor and the patient's personal wishes. The risks of surgery include, but are not limited to, bleeding, infection, the need for further surgery, and nerve injury. The patient has been given literature on the treatment of breast cancer.  Plan: 1) MRI of breast, 2) Referral to medical and radiation oncology, 3) Surgery plans after MRI - she is a candidate for right breast lumpectomy and right axillary SLNBx  Review of Systems as stated in this history (HPI) or in the review of systems. Otherwise all other 12 point ROS are negative  Past Medical History: 1. Right knee replacement - Dr. Ronnie Derby - Dec 2017 2. HTN 3. Colonoscopy - [facility] -  2019 4. Smoked 40+ years She worries some about lung ca  Social History: Divorced She comes with her daugheter, Adair Patter. (She works with [employer]) She has a son who is a Music therapist in [place]  She has a trip/cruise in October 2019.  She wanted to wait until she got back from the trip for surgery.  They know Kathryne Hitch (at [facility]) and Junior Scott's wife (she is divorced from her 2nd husband).   Past Surgical History Sabino Gasser; 05/10/2018 9:09 AM) Cataract Surgery  Bilateral. Foot Surgery  Bilateral. Knee Surgery  Right. Thyroid Surgery   Diagnostic Studies History Sabino Gasser; 05/10/2018 9:09 AM) Colonoscopy  within last year Mammogram  within last year  Allergies Sabino Gasser; 05/10/2018 9:10 AM) No Known Drug Allergies [05/10/2018]: Allergies Reconciled   Medication History Sabino Gasser; 05/10/2018 9:10 AM) ALPRAZolam (0.25MG Tablet, Oral) Active. Gabapentin (100MG Capsule, Oral) Active. Levothyroxine Sodium (75MCG Tablet, Oral) Active. ROPINIRole HCl (0.25MG Tablet, Oral) Active. Sertraline HCl (50MG Tablet, Oral) Active. Medications Reconciled  Social History Sabino Gasser; 05/10/2018 9:09 AM) No alcohol use  No caffeine use  No drug use  Tobacco use  Former smoker.  Family History Sabino Gasser; 05/10/2018 9:09 AM) Arthritis  Father, Mother, Sister. Depression  Mother, Sister. Diabetes Mellitus  Father. Heart Disease  Father. Hypertension  Sister. Malignant Neoplasm Of Pancreas  Mother. Thyroid problems  Sister.  Pregnancy / Birth History Sabino Gasser; 05/10/2018 9:09 AM) Age at menarche  63 years. Age of menopause  76-55 Gravida  3 Maternal age  70-20 Para  2  Other  Problems Sabino Gasser; 05/10/2018 9:09 AM) Arthritis  Back Pain  Thyroid Disease     Review of Systems Sabino Gasser; 05/10/2018 9:09 AM) General Not Present- Appetite Loss, Chills, Fatigue, Fever, Night  Sweats, Weight Gain and Weight Loss. Skin Not Present- Change in Wart/Mole, Dryness, Hives, Jaundice, New Lesions, Non-Healing Wounds, Rash and Ulcer. HEENT Not Present- Earache, Hearing Loss, Hoarseness, Nose Bleed, Oral Ulcers, Ringing in the Ears, Seasonal Allergies, Sinus Pain, Sore Throat, Visual Disturbances, Wears glasses/contact lenses and Yellow Eyes. Respiratory Not Present- Bloody sputum, Chronic Cough, Difficulty Breathing, Snoring and Wheezing. Breast Not Present- Breast Mass, Breast Pain, Nipple Discharge and Skin Changes. Cardiovascular Not Present- Chest Pain, Difficulty Breathing Lying Down, Leg Cramps, Palpitations, Rapid Heart Rate, Shortness of Breath and Swelling of Extremities. Gastrointestinal Not Present- Abdominal Pain, Bloating, Bloody Stool, Change in Bowel Habits, Chronic diarrhea, Constipation, Difficulty Swallowing, Excessive gas, Gets full quickly at meals, Hemorrhoids, Indigestion, Nausea, Rectal Pain and Vomiting. Female Genitourinary Not Present- Frequency, Nocturia, Painful Urination, Pelvic Pain and Urgency. Musculoskeletal Not Present- Back Pain, Joint Pain, Joint Stiffness, Muscle Pain, Muscle Weakness and Swelling of Extremities. Neurological Not Present- Decreased Memory, Fainting, Headaches, Numbness, Seizures, Tingling, Tremor, Trouble walking and Weakness. Psychiatric Not Present- Anxiety, Bipolar, Change in Sleep Pattern, Depression, Fearful and Frequent crying. Endocrine Not Present- Cold Intolerance, Excessive Hunger, Hair Changes, Heat Intolerance, Hot flashes and New Diabetes. Hematology Not Present- Blood Thinners, Easy Bruising, Excessive bleeding, Gland problems, HIV and Persistent Infections.  Vitals Sabino Gasser; 05/10/2018 9:11 AM) 05/10/2018 9:10 AM Weight: 177.13 lb Height: 64in Body Surface Area: 1.86 m Body Mass Index: 30.4 kg/m  Temp.: 98.32F(Oral)  Pulse: 115 (Regular)  BP: 132/80 (Sitting, Left Arm, Standard)   Physical  Exam  General: WN older WF alert and generally healthy appearing. Skin: Inspection and palpation of the skin unremarkable.  Eyes: Conjunctivae white, pupils equal. Face, ears, nose, mouth, and throat: Face - normal. Normal ears and nose. Lips and teeth normal.  Neck: Supple. No mass. Trachea midline. No thyroid mass. Lymph Nodes: No supraclavicular or cervical adenopathy. No axillary adenopathy.  Lungs: Normal respiratory effort. Clear to auscultation and symmetric breath sounds. Cardiovascular: Regular rate and rythm. Normal auscultation of the heart. No murmur or rub.  Breasts: Right - ? small mass at 11 o'clock, about 5 cm off areola  Left - Scar in upper left breast  Abdomen: Soft. No mass. Liver and spleen not palpable. No tenderness. No hernia. Normal bowel sounds. Rectal: Not done.  Musculoskeletal/extremities: Normal gait. Good strength and ROM in upper and lower extremities.  Neurologic: Grossly intact to motor and sensory function. Psychiatric: Has normal mood and affect. Judgement and insight appear normal.    Assessment & Plan   1.  MALIGNANT NEOPLASM OF RIGHT BREAST, STAGE 1, ESTROGEN RECEPTOR POSITIVE (C50.911)  Plan:   1) MRI of breast Addendum Note(Martin Belling H. Stela Iwasaki MD; 05/29/2018 3:12 PM)  1. 1.2 cm linear enhancement within the RETROAREOLAR RIGHT breast, 8 mm enhancing mass within the posterior UPPER central RIGHT breast, and 1.3 cm linear enhancement within the UPPER INNER LEFT breast. All 3 of these areas are indeterminate/suspicious and MR guided biopsies are recommended if breast conservation is desired.  2. Biopsy-proven 8 mm malignancy within the UPPER INNER RIGHT breast with associated biopsy clip artifact.  3. No abnormal appearing lymph nodes.  Addendum Note(Akiva Brassfield H. Lucia Gaskins MD; 06/07/2018 3:45 PM) MRI biopsies on 06/06/2018 --  1. Breast, right, needle core biopsy, retroareolar - COMPLEX SCLEROSING LESION WITH CALCIFICATIONS.  2. Breast,  right, needle core biopsy, superior central - FIBROCYSTIC AND FIBROADENOMATOID CHANGE WITH CALCIFICATIONS. - VASCULAR CALCIFICATIONS. - NO MALIGNANCY IDENTIFIED.   3. Breast, left, needle core biopsy, upper inner - PSEUDOANGIOMATOUS STROMAL HYPERPLASIA (Liberal). - FIBROCYSTIC CHANGE. - NO MALIGNANCY IDENTIFIED.    2) Referral to medical Lindi Adie) and radiation oncology    Dr. Lindi Adie put her on anti-estrogens until surgery.    3)  Plan two lumpectomies (seed localization x 2) of the right breast breast and the right axillary SLNBx  2. HTN 3. Smoked 40+ years She worries some about lung ca   Alphonsa Overall, MD, [facility] Pager: [phone] Office phone:  [phone]

## 2018-07-04 NOTE — Progress Notes (Signed)
Ensure Pre-Surgery drink given to patient with instructions to complete by 0400 DOS.  Surgical soap given with instructions for use.  Patient verbalized understanding of instructions.

## 2018-07-04 NOTE — Anesthesia Preprocedure Evaluation (Addendum)
Anesthesia Evaluation  Patient identified by MRN, date of birth, ID band Patient awake    Reviewed: Allergy & Precautions, NPO status , Patient's Chart, lab work & pertinent test results  Airway Mallampati: II  TM Distance: >3 FB Neck ROM: Full    Dental no notable dental hx. (+) Upper Dentures   Pulmonary former smoker,    Pulmonary exam normal breath sounds clear to auscultation       Cardiovascular hypertension, Pt. 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    GI/Hepatic Neg liver ROS, hiatal hernia,   Endo/Other  Hypothyroidism   Renal/GU negative Renal ROS  negative genitourinary   Musculoskeletal negative musculoskeletal ROS (+)   Abdominal   Peds negative pediatric ROS (+)  Hematology negative hematology ROS (+)   Anesthesia Other Findings   Reproductive/Obstetrics negative OB ROS                            Anesthesia Physical  Anesthesia Plan  ASA: II  Anesthesia Plan: General   Post-op Pain Management: GA combined w/ Regional for post-op pain   Induction: Intravenous  PONV Risk Score and Plan: 3 and Ondansetron, Treatment may vary due to age or medical condition and Dexamethasone  Airway Management Planned: LMA and Oral ETT  Additional Equipment:   Intra-op Plan:   Post-operative Plan: Extubation in OR  Informed Consent: I have reviewed the patients History and Physical, chart, labs and discussed the procedure including the risks, benefits and alternatives for the proposed anesthesia with the patient or authorized representative who has indicated his/her understanding and acceptance.   Dental advisory given  Plan Discussed with: CRNA, Anesthesiologist and Surgeon  Anesthesia Plan Comments:        Anesthesia Quick Evaluation

## 2018-07-05 ENCOUNTER — Ambulatory Visit (HOSPITAL_BASED_OUTPATIENT_CLINIC_OR_DEPARTMENT_OTHER): Payer: Medicare HMO | Admitting: Anesthesiology

## 2018-07-05 ENCOUNTER — Encounter (HOSPITAL_BASED_OUTPATIENT_CLINIC_OR_DEPARTMENT_OTHER)
Admission: RE | Disposition: A | Discharge status: Home / Self Care | Payer: Self-pay | Source: Ambulatory Visit | Attending: Surgery

## 2018-07-05 ENCOUNTER — Ambulatory Visit
Admission: RE | Admit: 2018-07-05 | Discharge: 2018-07-05 | Disposition: A | Discharge status: Home / Self Care | Payer: Medicare HMO | Source: Ambulatory Visit | Attending: Surgery | Admitting: Surgery

## 2018-07-05 ENCOUNTER — Encounter (HOSPITAL_BASED_OUTPATIENT_CLINIC_OR_DEPARTMENT_OTHER): Payer: Self-pay | Admitting: *Deleted

## 2018-07-05 ENCOUNTER — Other Ambulatory Visit: Payer: Self-pay

## 2018-07-05 ENCOUNTER — Ambulatory Visit (HOSPITAL_COMMUNITY)
Admission: RE | Admit: 2018-07-05 | Discharge: 2018-07-05 | Disposition: A | Discharge status: Home / Self Care | Payer: Medicare HMO | Source: Ambulatory Visit | Attending: Surgery | Admitting: Surgery

## 2018-07-05 ENCOUNTER — Other Ambulatory Visit: Payer: Self-pay | Admitting: Nurse Practitioner

## 2018-07-05 ENCOUNTER — Ambulatory Visit (HOSPITAL_BASED_OUTPATIENT_CLINIC_OR_DEPARTMENT_OTHER)
Admission: RE | Admit: 2018-07-05 | Discharge: 2018-07-05 | Disposition: A | Discharge status: Home / Self Care | Payer: Medicare HMO | Source: Ambulatory Visit | Attending: Surgery | Admitting: Surgery

## 2018-07-05 DIAGNOSIS — Z880 Allergy status to penicillin: Secondary | ICD-10-CM | POA: Insufficient documentation

## 2018-07-05 DIAGNOSIS — Z79899 Other long term (current) drug therapy: Secondary | ICD-10-CM | POA: Insufficient documentation

## 2018-07-05 DIAGNOSIS — C50911 Malignant neoplasm of unspecified site of right female breast: Secondary | ICD-10-CM

## 2018-07-05 DIAGNOSIS — Z170 Estrogen receptor positive status [ER+]: Principal | ICD-10-CM

## 2018-07-05 DIAGNOSIS — G5793 Unspecified mononeuropathy of bilateral lower limbs: Secondary | ICD-10-CM | POA: Diagnosis not present

## 2018-07-05 DIAGNOSIS — Z96651 Presence of right artificial knee joint: Secondary | ICD-10-CM | POA: Diagnosis not present

## 2018-07-05 DIAGNOSIS — F419 Anxiety disorder, unspecified: Secondary | ICD-10-CM | POA: Insufficient documentation

## 2018-07-05 DIAGNOSIS — E039 Hypothyroidism, unspecified: Secondary | ICD-10-CM | POA: Insufficient documentation

## 2018-07-05 DIAGNOSIS — N6011 Diffuse cystic mastopathy of right breast: Secondary | ICD-10-CM | POA: Diagnosis not present

## 2018-07-05 DIAGNOSIS — C50211 Malignant neoplasm of upper-inner quadrant of right female breast: Secondary | ICD-10-CM | POA: Diagnosis not present

## 2018-07-05 DIAGNOSIS — I10 Essential (primary) hypertension: Secondary | ICD-10-CM | POA: Insufficient documentation

## 2018-07-05 DIAGNOSIS — N6091 Unspecified benign mammary dysplasia of right breast: Secondary | ICD-10-CM | POA: Diagnosis not present

## 2018-07-05 DIAGNOSIS — F329 Major depressive disorder, single episode, unspecified: Secondary | ICD-10-CM | POA: Insufficient documentation

## 2018-07-05 DIAGNOSIS — G629 Polyneuropathy, unspecified: Secondary | ICD-10-CM

## 2018-07-05 DIAGNOSIS — R928 Other abnormal and inconclusive findings on diagnostic imaging of breast: Secondary | ICD-10-CM | POA: Diagnosis not present

## 2018-07-05 DIAGNOSIS — C50811 Malignant neoplasm of overlapping sites of right female breast: Secondary | ICD-10-CM | POA: Diagnosis not present

## 2018-07-05 DIAGNOSIS — Z8249 Family history of ischemic heart disease and other diseases of the circulatory system: Secondary | ICD-10-CM | POA: Insufficient documentation

## 2018-07-05 DIAGNOSIS — F5101 Primary insomnia: Secondary | ICD-10-CM

## 2018-07-05 DIAGNOSIS — G8918 Other acute postprocedural pain: Secondary | ICD-10-CM | POA: Diagnosis not present

## 2018-07-05 DIAGNOSIS — Z87891 Personal history of nicotine dependence: Secondary | ICD-10-CM | POA: Insufficient documentation

## 2018-07-05 DIAGNOSIS — F411 Generalized anxiety disorder: Secondary | ICD-10-CM

## 2018-07-05 DIAGNOSIS — R69 Illness, unspecified: Secondary | ICD-10-CM | POA: Diagnosis not present

## 2018-07-05 HISTORY — DX: Fibroadenosis of right breast: N60.21

## 2018-07-05 HISTORY — PX: BREAST LUMPECTOMY WITH RADIOACTIVE SEED AND SENTINEL LYMPH NODE BIOPSY: SHX6550

## 2018-07-05 HISTORY — DX: Malignant neoplasm of unspecified site of right female breast: C50.911

## 2018-07-05 SURGERY — BREAST LUMPECTOMY WITH RADIOACTIVE SEED AND SENTINEL LYMPH NODE BIOPSY
Anesthesia: General | Site: Breast | Laterality: Right

## 2018-07-05 MED ORDER — HYDROCODONE-ACETAMINOPHEN 5-325 MG PO TABS: 1.0000 | ORAL_TABLET | Freq: Four times a day (QID) | ORAL | 0 refills | Status: DC | PRN

## 2018-07-05 MED ORDER — CEFAZOLIN SODIUM-DEXTROSE 2-4 GM/100ML-% IV SOLN
2.0000 g | INTRAVENOUS | Status: AC
  Administered 2018-07-05: 2 g via INTRAVENOUS

## 2018-07-05 MED ORDER — LIDOCAINE HCL (CARDIAC) PF 100 MG/5ML IV SOSY
PREFILLED_SYRINGE | INTRAVENOUS | Status: DC | PRN
  Administered 2018-07-05: 40 mg via INTRAVENOUS

## 2018-07-05 MED ORDER — MIDAZOLAM HCL 2 MG/2ML IJ SOLN
INTRAMUSCULAR | Status: AC
  Filled 2018-07-05: qty 2

## 2018-07-05 MED ORDER — FENTANYL CITRATE (PF) 100 MCG/2ML IJ SOLN
50.0000 ug | INTRAMUSCULAR | Status: DC | PRN
  Administered 2018-07-05: 100 ug via INTRAVENOUS

## 2018-07-05 MED ORDER — ROPIVACAINE HCL 5 MG/ML IJ SOLN
INTRAMUSCULAR | Status: DC | PRN
  Administered 2018-07-05: 30 mL via PERINEURAL

## 2018-07-05 MED ORDER — OXYCODONE HCL 5 MG PO TABS
ORAL_TABLET | ORAL | Status: AC
  Filled 2018-07-05: qty 1

## 2018-07-05 MED ORDER — PROPOFOL 500 MG/50ML IV EMUL
INTRAVENOUS | Status: AC
  Filled 2018-07-05: qty 50

## 2018-07-05 MED ORDER — MIDAZOLAM HCL 2 MG/2ML IJ SOLN: 1.0000 mg | INTRAMUSCULAR | Status: DC | PRN

## 2018-07-05 MED ORDER — SCOPOLAMINE 1 MG/3DAYS TD PT72: 1.0000 | MEDICATED_PATCH | Freq: Once | TRANSDERMAL | Status: DC | PRN

## 2018-07-05 MED ORDER — CLONIDINE HCL (ANALGESIA) 100 MCG/ML EP SOLN
EPIDURAL | Status: DC | PRN
  Administered 2018-07-05: 100 ug

## 2018-07-05 MED ORDER — FENTANYL CITRATE (PF) 100 MCG/2ML IJ SOLN: 25.0000 ug | INTRAMUSCULAR | Status: DC | PRN

## 2018-07-05 MED ORDER — MEPERIDINE HCL 25 MG/ML IJ SOLN: 6.2500 mg | INTRAMUSCULAR | Status: DC | PRN

## 2018-07-05 MED ORDER — ONDANSETRON HCL 4 MG/2ML IJ SOLN
INTRAMUSCULAR | Status: AC
  Filled 2018-07-05: qty 2

## 2018-07-05 MED ORDER — FENTANYL CITRATE (PF) 100 MCG/2ML IJ SOLN
INTRAMUSCULAR | Status: AC
  Filled 2018-07-05: qty 2

## 2018-07-05 MED ORDER — BUPIVACAINE-EPINEPHRINE (PF) 0.25% -1:200000 IJ SOLN
INTRAMUSCULAR | Status: AC
  Filled 2018-07-05: qty 30

## 2018-07-05 MED ORDER — ONDANSETRON HCL 4 MG/2ML IJ SOLN
INTRAMUSCULAR | Status: DC | PRN
  Administered 2018-07-05: 4 mg via INTRAVENOUS

## 2018-07-05 MED ORDER — SODIUM CHLORIDE 0.9 % IJ SOLN
INTRAVENOUS | Status: DC | PRN
  Administered 2018-07-05: 1 mL via INTRAMUSCULAR

## 2018-07-05 MED ORDER — GABAPENTIN 300 MG PO CAPS
300.0000 mg | ORAL_CAPSULE | ORAL | Status: AC
  Administered 2018-07-05: 300 mg via ORAL

## 2018-07-05 MED ORDER — ACETAMINOPHEN 500 MG PO TABS
1000.0000 mg | ORAL_TABLET | ORAL | Status: AC
  Administered 2018-07-05: 1000 mg via ORAL

## 2018-07-05 MED ORDER — CHLORHEXIDINE GLUCONATE CLOTH 2 % EX PADS: 6.0000 | MEDICATED_PAD | Freq: Once | CUTANEOUS | Status: DC

## 2018-07-05 MED ORDER — PROPOFOL 10 MG/ML IV BOLUS
INTRAVENOUS | Status: DC | PRN
  Administered 2018-07-05: 50 mg via INTRAVENOUS
  Administered 2018-07-05: 150 mg via INTRAVENOUS

## 2018-07-05 MED ORDER — PROPOFOL 500 MG/50ML IV EMUL
INTRAVENOUS | Status: DC | PRN
  Administered 2018-07-05: 50 ug/kg/min via INTRAVENOUS

## 2018-07-05 MED ORDER — FENTANYL CITRATE (PF) 100 MCG/2ML IJ SOLN
INTRAMUSCULAR | Status: DC | PRN
  Administered 2018-07-05 (×2): 50 ug via INTRAVENOUS

## 2018-07-05 MED ORDER — BUPIVACAINE HCL (PF) 0.5 % IJ SOLN
INTRAMUSCULAR | Status: AC
  Filled 2018-07-05: qty 30

## 2018-07-05 MED ORDER — TECHNETIUM TC 99M SULFUR COLLOID FILTERED: 1.0000 | Freq: Once | INTRAVENOUS | Status: DC | PRN

## 2018-07-05 MED ORDER — DEXAMETHASONE SODIUM PHOSPHATE 10 MG/ML IJ SOLN
INTRAMUSCULAR | Status: AC
  Filled 2018-07-05: qty 1

## 2018-07-05 MED ORDER — BUPIVACAINE-EPINEPHRINE (PF) 0.25% -1:200000 IJ SOLN
INTRAMUSCULAR | Status: DC | PRN
  Administered 2018-07-05: 20 mL via PERINEURAL

## 2018-07-05 MED ORDER — METHYLENE BLUE 0.5 % INJ SOLN
INTRAVENOUS | Status: AC
  Filled 2018-07-05: qty 10

## 2018-07-05 MED ORDER — LIDOCAINE 2% (20 MG/ML) 5 ML SYRINGE
INTRAMUSCULAR | Status: AC
  Filled 2018-07-05: qty 5

## 2018-07-05 MED ORDER — GABAPENTIN 300 MG PO CAPS
ORAL_CAPSULE | ORAL | Status: AC
  Filled 2018-07-05: qty 1

## 2018-07-05 MED ORDER — DEXAMETHASONE SODIUM PHOSPHATE 4 MG/ML IJ SOLN
INTRAMUSCULAR | Status: DC | PRN
  Administered 2018-07-05: 10 mg via INTRAVENOUS

## 2018-07-05 MED ORDER — LACTATED RINGERS IV SOLN
INTRAVENOUS | Status: DC
  Administered 2018-07-05 (×3): via INTRAVENOUS

## 2018-07-05 MED ORDER — EPHEDRINE SULFATE 50 MG/ML IJ SOLN
INTRAMUSCULAR | Status: DC | PRN
  Administered 2018-07-05 (×3): 10 mg via INTRAVENOUS

## 2018-07-05 MED ORDER — ACETAMINOPHEN 500 MG PO TABS
ORAL_TABLET | ORAL | Status: AC
  Filled 2018-07-05: qty 2

## 2018-07-05 MED ORDER — OXYCODONE HCL 5 MG PO TABS
5.0000 mg | ORAL_TABLET | Freq: Once | ORAL | Status: AC
  Administered 2018-07-05: 5 mg via ORAL

## 2018-07-05 MED ORDER — PHENYLEPHRINE HCL 10 MG/ML IJ SOLN
INTRAMUSCULAR | Status: DC | PRN
  Administered 2018-07-05 (×5): 80 ug via INTRAVENOUS

## 2018-07-05 MED ORDER — CEFAZOLIN SODIUM-DEXTROSE 2-4 GM/100ML-% IV SOLN
INTRAVENOUS | Status: AC
  Filled 2018-07-05: qty 100

## 2018-07-05 SURGICAL SUPPLY — 49 items
BENZOIN TINCTURE PRP APPL 2/3 (GAUZE/BANDAGES/DRESSINGS) IMPLANT
BINDER BREAST LRG (GAUZE/BANDAGES/DRESSINGS) IMPLANT
BINDER BREAST MEDIUM (GAUZE/BANDAGES/DRESSINGS) ×2 IMPLANT
BINDER BREAST XLRG (GAUZE/BANDAGES/DRESSINGS) IMPLANT
BINDER BREAST XXLRG (GAUZE/BANDAGES/DRESSINGS) IMPLANT
BLADE SURG 15 STRL LF DISP TIS (BLADE) ×1 IMPLANT
BLADE SURG 15 STRL SS (BLADE) ×1
CANISTER SUC SOCK COL 7IN (MISCELLANEOUS) IMPLANT
CANISTER SUCT 1200ML W/VALVE (MISCELLANEOUS) ×2 IMPLANT
CHLORAPREP W/TINT 26ML (MISCELLANEOUS) ×2 IMPLANT
CLIP VESOCCLUDE SM WIDE 6/CT (CLIP) ×2 IMPLANT
COVER BACK TABLE 60X90IN (DRAPES) ×2 IMPLANT
COVER MAYO STAND STRL (DRAPES) ×2 IMPLANT
COVER PROBE W GEL 5X96 (DRAPES) ×2 IMPLANT
COVER WAND RF STERILE (DRAPES) IMPLANT
DECANTER SPIKE VIAL GLASS SM (MISCELLANEOUS) IMPLANT
DERMABOND ADVANCED (GAUZE/BANDAGES/DRESSINGS) ×1
DERMABOND ADVANCED .7 DNX12 (GAUZE/BANDAGES/DRESSINGS) ×1 IMPLANT
DEVICE DUBIN W/COMP PLATE 8390 (MISCELLANEOUS) ×2 IMPLANT
DRAPE LAPAROSCOPIC ABDOMINAL (DRAPES) ×2 IMPLANT
DRAPE UTILITY XL STRL (DRAPES) ×2 IMPLANT
DRSG PAD ABDOMINAL 8X10 ST (GAUZE/BANDAGES/DRESSINGS) IMPLANT
ELECT COATED BLADE 2.86 ST (ELECTRODE) ×2 IMPLANT
ELECT REM PT RETURN 9FT ADLT (ELECTROSURGICAL) ×2
ELECTRODE REM PT RTRN 9FT ADLT (ELECTROSURGICAL) ×1 IMPLANT
GAUZE SPONGE 4X4 12PLY STRL (GAUZE/BANDAGES/DRESSINGS) ×2 IMPLANT
GLOVE SURG SIGNA 7.5 PF LTX (GLOVE) ×6 IMPLANT
GOWN STRL REUS W/ TWL LRG LVL3 (GOWN DISPOSABLE) ×1 IMPLANT
GOWN STRL REUS W/ TWL XL LVL3 (GOWN DISPOSABLE) ×1 IMPLANT
GOWN STRL REUS W/TWL LRG LVL3 (GOWN DISPOSABLE) ×1
GOWN STRL REUS W/TWL XL LVL3 (GOWN DISPOSABLE) ×1
KIT MARKER MARGIN INK (KITS) ×2 IMPLANT
NDL SAFETY ECLIPSE 18X1.5 (NEEDLE) ×1 IMPLANT
NEEDLE HYPO 18GX1.5 SHARP (NEEDLE) ×1
NEEDLE HYPO 25X1 1.5 SAFETY (NEEDLE) ×2 IMPLANT
NS IRRIG 1000ML POUR BTL (IV SOLUTION) ×2 IMPLANT
PACK BASIN DAY SURGERY FS (CUSTOM PROCEDURE TRAY) ×2 IMPLANT
PENCIL BUTTON HOLSTER BLD 10FT (ELECTRODE) ×2 IMPLANT
SHEET MEDIUM DRAPE 40X70 STRL (DRAPES) ×2 IMPLANT
SLEEVE SCD COMPRESS KNEE MED (MISCELLANEOUS) ×2 IMPLANT
SPONGE LAP 18X18 RF (DISPOSABLE) ×2 IMPLANT
STRIP CLOSURE SKIN 1/2X4 (GAUZE/BANDAGES/DRESSINGS) IMPLANT
SUT MNCRL AB 4-0 PS2 18 (SUTURE) ×4 IMPLANT
SUT VICRYL 3-0 CR8 SH (SUTURE) ×4 IMPLANT
SYR CONTROL 10ML LL (SYRINGE) ×2 IMPLANT
TOWEL GREEN STERILE FF (TOWEL DISPOSABLE) ×2 IMPLANT
TOWEL OR NON WOVEN STRL DISP B (DISPOSABLE) ×2 IMPLANT
TUBE CONNECTING 20X1/4 (TUBING) ×2 IMPLANT
YANKAUER SUCT BULB TIP NO VENT (SUCTIONS) ×2 IMPLANT

## 2018-07-05 NOTE — Op Note (Signed)
07/05/2018  9:41 AM  PATIENT:  Phyllis Walker DOB: 06/26/1946 MRN: [id number]  PREOP DIAGNOSIS:   RIGHT BREAST CANCER AND RIGHT BREAST COMPLEX SCLEROSING LESION  POSTOP DIAGNOSIS:    Right breast cancer, 12:30 o'clock position (T1, N0),  Complex sclerosing lesion at 9 o'clock  PROCEDURE:   Procedure(s): RIGHT BREAST LUMPECTOMY WITH RADIOACTIVE SEED X 2 AND RIGHT AXILLARY SENTINEL LYMPH NODE BIOPSY, Injection of peri areolar area of breast with methylene blue (1.0 cc), deep sentinel lymph node biopsy  SURGEON:   Alphonsa Overall, M.D.  ANESTHESIA:   general  Anesthesiologist: Montez Hageman, MD CRNA: Marrianne Mood, CRNA; Tawni Millers, CRNA  General  EBL:  minimal  ml  DRAINS:  none   LOCAL MEDICATIONS USED:   20 cc of 1/4% marcaine  SPECIMEN:   Right breast mass (12:30 o'clock, 6 color paint kit), right breast mass (9:00 o'clock, 6 color paint kit),  Right axillary sentinel lymph node biopsy (counts 140, background 0, blue)  COUNTS CORRECT:  YES  INDICATIONS FOR PROCEDURE:  Phyllis Walker is a 72 y.o. (DOB: [birth year]) white female whose primary care physician is Hassell Done, Mary-Margaret, FNP and comes for right breast lumpectomy x 2 (one for cancer at 12:30 o'clock and one for CSL at 9:00 o'lcock) and right axillary sentinel lymph node biopsy.   The options for breast cancer treatment have been discussed with the patient. She has seen Dr. Lindi Adie from oncology.  She elected to proceed with lumpectomy and axillary sentinel lymph node.     The indications and potential complications of surgery were explained to the patient. Potential complications include, but are not limited to, bleeding, infection, the need for further surgery, and nerve injury.     She had a two I131 seeds placed on 07/04/2018 in her right breast at [facility].  The seeds are in the 12:30 and 9:00 o'clock position of the right breast.   In the holding area, her right areola was injected with 1 millicurie of  Technitium Sulfur Colloid.  OPERATIVE NOTE:   The patient was taken to operating room # 8 at [facility] where she underwent a general anesthesia  supervised by Anesthesiologist: Montez Hageman, MD CRNA: Marrianne Mood, CRNA; Tawni Millers, CRNA. Her right breast and axilla were prepped with  ChloraPrep and sterilely draped.    A time-out and the surgical check list was reviewed.    I injected about 1.0 mL of 40% methylene blue around her right areola.   I first excised the area of the CSL at the 9:00 o'clock position of the right breast.  I made a circumareolar incision and excised a mass of the breast about 3 x 3 cm.  I painted the lumpectomy specimen with the 6 color paint kit and did a specimen mammogram which confirmed the mass, clip, and the seed were all in the right position in the specimen.  The specimen was sent to pathology who called back to confirm that they have the seed and the specimen.   I turned attention to the cancer which was about at the 12:30 o'clock position of the right breast.   I used the Neoprobe to identify the I131 seed.  I tried to excise an area around the tumor of at least 1 cm.    I excised this block of breast tissue approximately 4 cm by 4 cm  in diameter.  I did take th dissection down to the pectoralis muscle.  I painted the lumpectomy  specimen with the 6 color paint kit and did a specimen mammogram which confirmed the mass, clip, and the seed were all in the right position in the specimen.  The specimen was sent to pathology who called back to confirm that they have the seed and the specimen.   I then started the right deep axillary sentinel lymph node biopsy. I made an incision in the right axilla.  I found a hot area at the junction of the breast and the pectoralis major muscle, deep in the axilla. I cut down and  identified a hot node that had counts of 140 and the background has 0 counts. The lymph node was blue. I checked her internal mammary nodes  and supraclavicular nodes with the neoprobe and found no other hot area. The axillary node was then sent to pathology.    I then irrigated the wound with saline. I infiltrated approximately 30 mL of 1/4% Marcaine between the incisions. I placed 4 clips to mark biopsy cavity of the cancer at 12:30 o'clock, at 12, 3, 6, and 9 o'clock in the cavity.  I then closed all the wounds in layers using 3-0 Vicryl sutures for the deep layer. At the skin, I closed the incisions with a 4-0 Monocryl suture. The incisions were then painted with Dermabond.  She had gauze place over the wounds and placed in a breast binder.   The patient tolerated the procedure well, was transported to the recovery room in good condition. Sponge and needle count were correct at the end of the case.   Final pathology is pending.   Alphonsa Overall, MD, [facility] Pager: [phone] Office phone:  [phone]

## 2018-07-05 NOTE — Interval H&P Note (Signed)
History and Physical Interval Note:  07/05/2018 7:12 AM  Phyllis Walker  has presented today for surgery, with the diagnosis of RIGHT BREAST CANCER AND RIGHT BREAST COMPLEX SCLEROSING LESION  The various methods of treatment have been discussed with the patient and family.   She has two seeds in her right breast.  After consideration of risks, benefits and other options for treatment, the patient has consented to  Procedure(s): RIGHT BREAST LUMPECTOMY WITH RADIOACTIVE SEED X 2 AND RIGHT AXILLARY SENTINEL LYMPH NODE BIOPSY (Right) as a surgical intervention .  The patient's history has been reviewed, patient examined, no change in status, stable for surgery.  I have reviewed the patient's chart and labs.  Questions were answered to the patient's satisfaction.     Shann Medal

## 2018-07-05 NOTE — Anesthesia Procedure Notes (Signed)
Procedure Name: LMA Insertion Date/Time: 07/05/2018 7:41 AM Performed by: Marrianne Mood, CRNA Pre-anesthesia Checklist: Patient identified, Emergency Drugs available, Suction available, Patient being monitored and Timeout performed Patient Re-evaluated:Patient Re-evaluated prior to induction Oxygen Delivery Method: Circle system utilized Preoxygenation: Pre-oxygenation with 100% oxygen Induction Type: IV induction Ventilation: Mask ventilation without difficulty LMA: LMA inserted LMA Size: 4.0 Number of attempts: 1 Airway Equipment and Method: Bite block Placement Confirmation: positive ETCO2 Tube secured with: Tape Dental Injury: Teeth and Oropharynx as per pre-operative assessment

## 2018-07-05 NOTE — Discharge Instructions (Signed)
[facility] - DISCHARGE INSTRUCTIONS TO PATIENT  Activity:  Driving - May drive in 2 or 3 days, if doing well.   Lifting - No lifting more than 15 pounds for one week, then no limit  Wound Care:   Leave bandages on for 2 days, then you may remove the bandage and shower.  Diet:  As tolerated  Follow up appointment:  Call Dr. Pollie Friar office [facility]) at [phone] for an appointment in 2 to 3 weeks.  Medications and dosages:  Resume your home medications.  You have a prescription for:  Vicodin  Call Dr. Lucia Gaskins or his office  [phone]) if you have:  Temperature greater than 100.4,  Persistent nausea and vomiting,  Severe uncontrolled pain,  Redness, tenderness, or signs of infection (pain, swelling, redness, odor or green/yellow discharge around the site),  Difficulty breathing, headache or visual disturbances,  Any other questions or concerns you may have after discharge.  In an emergency, call 911 or go to an Emergency Department at a nearby hospital.    Tylenol 1,000mg  given at 7:00am Oxycodone 5mg  given at 11:45am   Post Anesthesia Home Care Instructions  Activity: Get plenty of rest for the remainder of the day. A responsible individual must stay with you for 24 hours following the procedure.  For the next 24 hours, DO NOT: -Drive a car -Paediatric nurse -Drink alcoholic beverages -Take any medication unless instructed by your physician -Make any legal decisions or sign important papers.  Meals: Start with liquid foods such as gelatin or soup. Progress to regular foods as tolerated. Avoid greasy, spicy, heavy foods. If nausea and/or vomiting occur, drink only clear liquids until the nausea and/or vomiting subsides. Call your physician if vomiting continues.  Special Instructions/Symptoms: Your throat may feel dry or sore from the anesthesia or the breathing tube placed in your throat during surgery. If this causes  discomfort, gargle with warm salt water. The discomfort should disappear within 24 hours.  If you had a scopolamine patch placed behind your ear for the management of post- operative nausea and/or vomiting:  1. The medication in the patch is effective for 72 hours, after which it should be removed.  Wrap patch in a tissue and discard in the trash. Wash hands thoroughly with soap and water. 2. You may remove the patch earlier than 72 hours if you experience unpleasant side effects which may include dry mouth, dizziness or visual disturbances. 3. Avoid touching the patch. Wash your hands with soap and water after contact with the patch.

## 2018-07-05 NOTE — Anesthesia Postprocedure Evaluation (Signed)
Anesthesia Post Note  Patient: AMELLIA PANIK  Procedure(s) Performed: RIGHT BREAST LUMPECTOMY WITH RADIOACTIVE SEED X 2 AND RIGHT AXILLARY SENTINEL LYMPH NODE BIOPSY (Right Breast)     Patient location during evaluation: PACU Anesthesia Type: General Level of consciousness: awake and alert Pain management: pain level controlled Vital Signs Assessment: post-procedure vital signs reviewed and stable Respiratory status: spontaneous breathing, nonlabored ventilation, respiratory function stable and patient connected to nasal cannula oxygen Cardiovascular status: blood pressure returned to baseline and stable Postop Assessment: no apparent nausea or vomiting Anesthetic complications: no    Last Vitals:  Vitals:   07/05/18 1030 07/05/18 1045  BP: 126/63 (!) 129/54  Pulse: 78 77  Resp: 18 14  Temp:    SpO2: 95% 98%    Last Pain:  Vitals:   07/05/18 1030  TempSrc:   PainSc: Asleep                 Montez Hageman

## 2018-07-05 NOTE — Transfer of Care (Signed)
Immediate Anesthesia Transfer of Care Note  Patient: Phyllis Walker  Procedure(s) Performed: RIGHT BREAST LUMPECTOMY WITH RADIOACTIVE SEED X 2 AND RIGHT AXILLARY SENTINEL LYMPH NODE BIOPSY (Right Breast)  Patient Location: PACU  Anesthesia Type:GA combined with regional for post-op pain  Level of Consciousness: awake and patient cooperative  Airway & Oxygen Therapy: Patient Spontanous Breathing and Patient connected to face mask oxygen  Post-op Assessment: Report given to RN and Post -op Vital signs reviewed and stable  Post vital signs: Reviewed and stable  Last Vitals:  Vitals Value Taken Time  BP 123/55 07/05/2018  9:38 AM  Temp    Pulse 80 07/05/2018  9:39 AM  Resp 17 07/05/2018  9:39 AM  SpO2 100 % 07/05/2018  9:39 AM  Vitals shown include unvalidated device data.  Last Pain:  Vitals:   07/05/18 0651  TempSrc: Oral  PainSc: 0-No pain         Complications: No apparent anesthesia complications

## 2018-07-05 NOTE — Progress Notes (Signed)
Assisted Dr. Carignan with right, ultrasound guided, pectoralis block. Side rails up, monitors on throughout procedure. See vital signs in flow sheet. Tolerated Procedure well. 

## 2018-07-05 NOTE — Anesthesia Procedure Notes (Signed)
Anesthesia Regional Block: Pectoralis block   Pre-Anesthetic Checklist: ,, timeout performed, Correct Patient, Correct Site, Correct Laterality, Correct Procedure, Correct Position, site marked, Risks and benefits discussed,  Surgical consent,  Pre-op evaluation,  At surgeon's request and post-op pain management  Laterality: Right  Prep: Maximum Sterile Barrier Precautions used, chloraprep       Needles:  Injection technique: Single-shot  Needle Type: Echogenic Stimulator Needle     Needle Length: 10cm      Additional Needles:   Procedures:,,,, ultrasound used (permanent image in chart),,,,  Narrative:  Start time: 07/05/2018 7:09 AM End time: 07/05/2018 7:19 AM Injection made incrementally with aspirations every 5 mL.  Performed by: Personally  Anesthesiologist: Montez Hageman, MD  Additional Notes: Risks, benefits and alternative to block explained extensively.  Patient tolerated procedure well, without complications.

## 2018-07-06 ENCOUNTER — Encounter (HOSPITAL_BASED_OUTPATIENT_CLINIC_OR_DEPARTMENT_OTHER): Payer: Self-pay | Admitting: Surgery

## 2018-07-12 ENCOUNTER — Inpatient Hospital Stay: Payer: Medicare HMO | Attending: Hematology and Oncology | Admitting: Hematology and Oncology

## 2018-07-12 ENCOUNTER — Telehealth: Payer: Self-pay | Admitting: *Deleted

## 2018-07-12 VITALS — BP 152/68 | HR 97 | Temp 97.9°F | Resp 16 | Ht 64.0 in | Wt 180.8 lb

## 2018-07-12 DIAGNOSIS — Z23 Encounter for immunization: Secondary | ICD-10-CM | POA: Diagnosis not present

## 2018-07-12 DIAGNOSIS — Z79899 Other long term (current) drug therapy: Secondary | ICD-10-CM | POA: Insufficient documentation

## 2018-07-12 DIAGNOSIS — Z170 Estrogen receptor positive status [ER+]: Secondary | ICD-10-CM | POA: Diagnosis not present

## 2018-07-12 DIAGNOSIS — C50211 Malignant neoplasm of upper-inner quadrant of right female breast: Secondary | ICD-10-CM | POA: Insufficient documentation

## 2018-07-12 DIAGNOSIS — Z299 Encounter for prophylactic measures, unspecified: Secondary | ICD-10-CM

## 2018-07-12 MED ORDER — INFLUENZA VAC SPLIT QUAD 0.5 ML IM SUSY
0.5000 mL | PREFILLED_SYRINGE | Freq: Once | INTRAMUSCULAR | Status: AC
  Administered 2018-07-12: 0.5 mL via INTRAMUSCULAR

## 2018-07-12 MED ORDER — INFLUENZA VAC SPLIT QUAD 0.5 ML IM SUSY
PREFILLED_SYRINGE | INTRAMUSCULAR | Status: AC
  Filled 2018-07-12: qty 0.5

## 2018-07-12 NOTE — Progress Notes (Signed)
Patient Care Team: Chevis Pretty, FNP as PCP - General (Nurse Practitioner) Vickey Huger, MD as Consulting Physician (Orthopedic Surgery) Huel Cote, NP as Nurse Practitioner (Obstetrics and Gynecology) Nicholas Lose, MD as Consulting Physician (Hematology and Oncology)  DIAGNOSIS:  Encounter Diagnosis  Name Primary?  . Malignant neoplasm of upper-inner quadrant of right breast in female, estrogen receptor positive ([place])     SUMMARY OF ONCOLOGIC HISTORY:   Malignant neoplasm of upper-inner quadrant of right breast in female, estrogen receptor positive ([place])   04/25/2018 Initial Diagnosis    Abnormal mammogram on 04/17/2018, biopsy revealed invasive lobular cancer grade 2, ER 90%, PR 0%, Ki-67 10%, HER-2 negative, breast MRI revealed 8 mm biopsy-proven malignancy UIQ, additional findings 1.2 cm linear enhancement retroareolar, 8 mm enhancing mass posterior upper central right breast, 1.3 cm linear enhancement upper inner left breast, no abnormal lymph nodes, T1 b N0 stage Ia    05/31/2018 Cancer Staging    Staging form: Breast, AJCC 8th Edition - Clinical: Stage IA (cT1b(3), cN0, cM0, G2, ER+, PR-, HER2-) - Signed by Nicholas Lose, MD on 05/31/2018    07/05/2018 Surgery    Right lumpectomy: Invasive lobular cancer, grade 2, 0.8 cm, ALH, margins negative, 0/3 lymph nodes negative ER 90%, PR 0%, HER-2 negative, Ki-67 10%, T1 be N0 stage Ia     CHIEF COMPLIANT: Follow-up after recent right lumpectomy  INTERVAL HISTORY: Phyllis Walker is a 72 year old with above-mentioned history of right breast cancer was treated with lumpectomy is here today to discuss her report.  She does not have any pain or discomfort and has done extremely well with the surgery.  REVIEW OF SYSTEMS:   Constitutional: Denies fevers, chills or abnormal weight loss Eyes: Denies blurriness of vision Ears, nose, mouth, throat, and face: Denies mucositis or sore throat Respiratory: Denies cough, dyspnea or  wheezes Cardiovascular: Denies palpitation, chest discomfort Gastrointestinal:  Denies nausea, heartburn or change in bowel habits Skin: Denies abnormal skin rashes Lymphatics: Denies new lymphadenopathy or easy bruising Neurological:Denies numbness, tingling or new weaknesses Behavioral/Psych: Mood is stable, no new changes  Extremities: No lower extremity edema Breast: Recent right lumpectomy All other systems were reviewed with the patient and are negative.  I have reviewed the past medical history, past surgical history, social history and family history with the patient and they are unchanged from previous note.  ALLERGIES:  is allergic to amoxicillin-pot clavulanate.  MEDICATIONS:  Current Outpatient Medications  Medication Sig Dispense Refill  . ALPRAZolam (XANAX) 0.25 MG tablet TAKE (1) TABLET TWICE A DAY AS NEEDED. 60 tablet 0  . benazepril-hydrochlorthiazide (LOTENSIN HCT) 20-12.5 MG tablet Take 1 tablet by mouth daily. (Patient taking differently: Take 0.5 tablets by mouth daily. ) 90 tablet 1  . gabapentin (NEURONTIN) 100 MG capsule TAKE (1) CAPSULE THREE TIMES DAILY. 90 capsule 0  . letrozole (FEMARA) 2.5 MG tablet Take 1 tablet (2.5 mg total) by mouth daily. 90 tablet 3  . levothyroxine (SYNTHROID, LEVOTHROID) 75 MCG tablet Take 1 tablet (75 mcg total) by mouth daily. 90 tablet 1  . Omega-3 Fatty Acids (FISH OIL) 1200 MG CAPS Take 1,200 mg by mouth daily.    . Red Yeast Rice 600 MG CAPS Take by mouth daily.    [name] rOPINIRole (REQUIP) 0.25 MG tablet TAKE  (1)  TABLET  THREE TIMES DAILY. 270 tablet 1  . sertraline (ZOLOFT) 50 MG tablet Take 1 tablet by mouth  daily 90 tablet 1  . zolpidem (AMBIEN) 10 MG tablet  TAKE 1 TABLET AT BEDTIME AS NEEDED FOR SLEEP 30 tablet 0   Current Facility-Administered Medications  Medication Dose Route Frequency Provider Last Rate Last Dose  . cyanocobalamin ((VITAMIN B-12)) injection 1,000 mcg  1,000 mcg Intramuscular Q30 days Chevis Pretty, FNP   1,000 mcg at 07/02/18 8676    PHYSICAL EXAMINATION: ECOG PERFORMANCE STATUS: 1 - Symptomatic but completely ambulatory  Vitals:   07/12/18 1404  BP: (!) 152/68  Pulse: 97  Resp: 16  Temp: 97.9 F (36.6 C)  SpO2: 95%   Filed Weights   07/12/18 1404  Weight: 180 lb 12.8 oz (82 kg)    GENERAL:alert, no distress and comfortable SKIN: skin color, texture, turgor are normal, no rashes or significant lesions EYES: normal, Conjunctiva are pink and non-injected, sclera clear OROPHARYNX:no exudate, no erythema and lips, buccal mucosa, and tongue normal  NECK: supple, thyroid normal size, non-tender, without nodularity LYMPH:  no palpable lymphadenopathy in the cervical, axillary or inguinal LUNGS: clear to auscultation and percussion with normal breathing effort HEART: regular rate & rhythm and no murmurs and no lower extremity edema ABDOMEN:abdomen soft, non-tender and normal bowel sounds MUSCULOSKELETAL:no cyanosis of digits and no clubbing  NEURO: alert & oriented x 3 with fluent speech, no focal motor/sensory deficits EXTREMITIES: No lower extremity edema    LABORATORY DATA:  I have reviewed the data as listed CMP Latest Ref Rng & Units 07/04/2018 04/02/2018 12/26/2017  Glucose 70 - 99 mg/dL 84 93 88  BUN 8 - 23 mg/dL '13 14 14  ' Creatinine 0.44 - 1.00 mg/dL 0.78 0.77 0.76  Sodium 135 - 145 mmol/L 143 142 144  Potassium 3.5 - 5.1 mmol/L 3.5 4.5 3.9  Chloride 98 - 111 mmol/L 104 102 102  CO2 22 - 32 mmol/L '28 27 28  ' Calcium 8.9 - 10.3 mg/dL 8.9 9.3 9.3  Total Protein 6.0 - 8.5 g/dL - 6.9 6.6  Total Bilirubin 0.0 - 1.2 mg/dL - 0.4 0.3  Alkaline Phos 39 - 117 IU/L - 102 99  AST 0 - 40 IU/L - 15 19  ALT 0 - 32 IU/L - 9 10    Lab Results  Component Value Date   WBC 6.8 04/02/2018   HGB 12.0 04/02/2018   HCT 36.2 04/02/2018   MCV 86 04/02/2018   PLT 294 04/02/2018   NEUTROABS 3.9 04/02/2018    ASSESSMENT & PLAN:  Malignant neoplasm of upper-inner  quadrant of right breast in female, estrogen receptor positive ([place]) 07/05/2018: Right lumpectomy: Invasive lobular cancer, grade 2, 0.8 cm, ALH, margins negative, 0/3 lymph nodes negative ER 90%, PR 0%, HER-2 negative, Ki-67 10%, T1 be N0 stage Ia  Pathology counseling: I discussed the final pathology report of the patient provided  a copy of this report. I discussed the margins as well as lymph node surgeries. We also discussed the final staging along with previously performed ER/PR and HER-2/neu testing.  Treatment plan: 1.  Oncotype DX to determine benefit from chemo 2. followed by adjuvant radiation 3.  Followed by adjuvant antiestrogen therapy.  Patient was on letrozole before surgery.  She will continue with letrozole after radiation is complete.  Return to clinic at the end of radiation or based upon Oncotype test results.    No orders of the defined types were placed in this encounter.  The patient has a good understanding of the overall plan. she agrees with it. she will call with any problems that may develop before the next visit here.  Harriette Ohara, MD 07/12/18

## 2018-07-12 NOTE — Telephone Encounter (Signed)
Received order for oncotype testing. Requisition faxed to pathology 

## 2018-07-12 NOTE — Addendum Note (Signed)
Addended by: Laureen Abrahams on: 07/12/2018 02:30 PM   Modules accepted: Orders

## 2018-07-12 NOTE — Assessment & Plan Note (Signed)
07/05/2018: Right lumpectomy: Invasive lobular cancer, grade 2, 0.8 cm, ALH, margins negative, 0/3 lymph nodes negative ER 90%, PR 0%, HER-2 negative, Ki-67 10%, T1 be N0 stage Ia  Pathology counseling: I discussed the final pathology report of the patient provided  a copy of this report. I discussed the margins as well as lymph node surgeries. We also discussed the final staging along with previously performed ER/PR and HER-2/neu testing.  Treatment plan: 1.  Oncotype DX to determine benefit from chemo 2. followed by adjuvant radiation 3.  Followed by adjuvant antiestrogen therapy.  Patient was on letrozole before surgery.  She will continue with letrozole after radiation is complete.  Return to clinic at the end of radiation or based upon Oncotype test results.

## 2018-07-20 ENCOUNTER — Telehealth: Payer: Self-pay | Admitting: *Deleted

## 2018-07-20 DIAGNOSIS — C50211 Malignant neoplasm of upper-inner quadrant of right female breast: Secondary | ICD-10-CM

## 2018-07-20 DIAGNOSIS — Z170 Estrogen receptor positive status [ER+]: Secondary | ICD-10-CM

## 2018-07-20 NOTE — Telephone Encounter (Signed)
Received oncotype score of 24/10%.  Patient is aware. Team notified.  Referral placed for Dr. Lisbeth Renshaw

## 2018-07-24 ENCOUNTER — Encounter (HOSPITAL_COMMUNITY): Payer: Self-pay | Admitting: Hematology and Oncology

## 2018-07-24 ENCOUNTER — Encounter: Payer: Self-pay | Admitting: Hematology and Oncology

## 2018-07-27 ENCOUNTER — Other Ambulatory Visit: Payer: Self-pay | Admitting: Radiation Oncology

## 2018-07-27 ENCOUNTER — Telehealth: Payer: Self-pay | Admitting: *Deleted

## 2018-07-27 DIAGNOSIS — Z170 Estrogen receptor positive status [ER+]: Secondary | ICD-10-CM

## 2018-07-27 DIAGNOSIS — C50211 Malignant neoplasm of upper-inner quadrant of right female breast: Secondary | ICD-10-CM

## 2018-07-27 NOTE — Telephone Encounter (Signed)
Referral faxed to [facility] for xrt in [place].

## 2018-07-30 ENCOUNTER — Telehealth: Payer: Self-pay | Admitting: Radiation Oncology

## 2018-07-30 NOTE — Telephone Encounter (Signed)
Patient would like treatment in [place]. Phyllis Walker took care of new referral. Cancelled appts.

## 2018-08-02 ENCOUNTER — Ambulatory Visit (INDEPENDENT_AMBULATORY_CARE_PROVIDER_SITE_OTHER): Payer: Medicare HMO | Admitting: *Deleted

## 2018-08-02 DIAGNOSIS — E538 Deficiency of other specified B group vitamins: Secondary | ICD-10-CM | POA: Diagnosis not present

## 2018-08-02 NOTE — Progress Notes (Signed)
Pt given Cyanocobalamin inj Tolerated well 

## 2018-08-03 ENCOUNTER — Other Ambulatory Visit: Payer: Self-pay | Admitting: Nurse Practitioner

## 2018-08-03 DIAGNOSIS — F411 Generalized anxiety disorder: Secondary | ICD-10-CM

## 2018-08-03 DIAGNOSIS — G629 Polyneuropathy, unspecified: Secondary | ICD-10-CM

## 2018-08-03 DIAGNOSIS — F5101 Primary insomnia: Secondary | ICD-10-CM

## 2018-08-06 NOTE — Telephone Encounter (Signed)
Last seen 04/02/18

## 2018-08-07 DIAGNOSIS — Z9889 Other specified postprocedural states: Secondary | ICD-10-CM | POA: Diagnosis not present

## 2018-08-07 DIAGNOSIS — C50211 Malignant neoplasm of upper-inner quadrant of right female breast: Secondary | ICD-10-CM | POA: Diagnosis not present

## 2018-08-07 DIAGNOSIS — Z170 Estrogen receptor positive status [ER+]: Secondary | ICD-10-CM | POA: Diagnosis not present

## 2018-08-14 ENCOUNTER — Ambulatory Visit: Payer: Medicare HMO | Admitting: Radiation Oncology

## 2018-08-14 ENCOUNTER — Ambulatory Visit: Payer: Self-pay | Admitting: Radiation Oncology

## 2018-08-21 DIAGNOSIS — Z9889 Other specified postprocedural states: Secondary | ICD-10-CM | POA: Diagnosis not present

## 2018-08-21 DIAGNOSIS — C50211 Malignant neoplasm of upper-inner quadrant of right female breast: Secondary | ICD-10-CM | POA: Diagnosis not present

## 2018-08-21 DIAGNOSIS — Z170 Estrogen receptor positive status [ER+]: Secondary | ICD-10-CM | POA: Diagnosis not present

## 2018-08-22 DIAGNOSIS — Z9889 Other specified postprocedural states: Secondary | ICD-10-CM | POA: Diagnosis not present

## 2018-08-22 DIAGNOSIS — Z170 Estrogen receptor positive status [ER+]: Secondary | ICD-10-CM | POA: Diagnosis not present

## 2018-08-22 DIAGNOSIS — C50211 Malignant neoplasm of upper-inner quadrant of right female breast: Secondary | ICD-10-CM | POA: Diagnosis not present

## 2018-08-29 ENCOUNTER — Other Ambulatory Visit: Payer: Self-pay | Admitting: Pediatrics

## 2018-08-29 DIAGNOSIS — J40 Bronchitis, not specified as acute or chronic: Secondary | ICD-10-CM

## 2018-08-31 ENCOUNTER — Telehealth: Payer: Self-pay | Admitting: Hematology and Oncology

## 2018-08-31 ENCOUNTER — Ambulatory Visit (INDEPENDENT_AMBULATORY_CARE_PROVIDER_SITE_OTHER): Payer: Medicare HMO | Admitting: *Deleted

## 2018-08-31 ENCOUNTER — Other Ambulatory Visit: Payer: Self-pay | Admitting: Nurse Practitioner

## 2018-08-31 DIAGNOSIS — G629 Polyneuropathy, unspecified: Secondary | ICD-10-CM

## 2018-08-31 DIAGNOSIS — F5101 Primary insomnia: Secondary | ICD-10-CM

## 2018-08-31 DIAGNOSIS — E538 Deficiency of other specified B group vitamins: Secondary | ICD-10-CM | POA: Diagnosis not present

## 2018-08-31 DIAGNOSIS — G2581 Restless legs syndrome: Secondary | ICD-10-CM

## 2018-08-31 DIAGNOSIS — F3342 Major depressive disorder, recurrent, in full remission: Secondary | ICD-10-CM

## 2018-08-31 DIAGNOSIS — F411 Generalized anxiety disorder: Secondary | ICD-10-CM

## 2018-08-31 NOTE — Progress Notes (Signed)
Pt given Cyanocobalamin inj Tolerated well 

## 2018-08-31 NOTE — Telephone Encounter (Signed)
Scheduled appt per 12/17 schmessage - pt is aware of appt date and time   

## 2018-08-31 NOTE — Telephone Encounter (Signed)
Last seen 04/02/18  MMM

## 2018-09-07 DIAGNOSIS — Z170 Estrogen receptor positive status [ER+]: Secondary | ICD-10-CM | POA: Diagnosis not present

## 2018-09-07 DIAGNOSIS — C50211 Malignant neoplasm of upper-inner quadrant of right female breast: Secondary | ICD-10-CM | POA: Diagnosis not present

## 2018-09-07 DIAGNOSIS — Z9889 Other specified postprocedural states: Secondary | ICD-10-CM | POA: Diagnosis not present

## 2018-09-10 DIAGNOSIS — Z170 Estrogen receptor positive status [ER+]: Secondary | ICD-10-CM | POA: Diagnosis not present

## 2018-09-10 DIAGNOSIS — C50211 Malignant neoplasm of upper-inner quadrant of right female breast: Secondary | ICD-10-CM | POA: Diagnosis not present

## 2018-09-10 DIAGNOSIS — Z9889 Other specified postprocedural states: Secondary | ICD-10-CM | POA: Diagnosis not present

## 2018-09-11 DIAGNOSIS — C50211 Malignant neoplasm of upper-inner quadrant of right female breast: Secondary | ICD-10-CM | POA: Diagnosis not present

## 2018-09-11 DIAGNOSIS — Z9889 Other specified postprocedural states: Secondary | ICD-10-CM | POA: Diagnosis not present

## 2018-09-11 DIAGNOSIS — Z170 Estrogen receptor positive status [ER+]: Secondary | ICD-10-CM | POA: Diagnosis not present

## 2018-09-13 DIAGNOSIS — Z170 Estrogen receptor positive status [ER+]: Secondary | ICD-10-CM | POA: Diagnosis not present

## 2018-09-13 DIAGNOSIS — C50211 Malignant neoplasm of upper-inner quadrant of right female breast: Secondary | ICD-10-CM | POA: Diagnosis not present

## 2018-09-13 DIAGNOSIS — Z9889 Other specified postprocedural states: Secondary | ICD-10-CM | POA: Diagnosis not present

## 2018-09-14 DIAGNOSIS — Z9889 Other specified postprocedural states: Secondary | ICD-10-CM | POA: Diagnosis not present

## 2018-09-14 DIAGNOSIS — C50211 Malignant neoplasm of upper-inner quadrant of right female breast: Secondary | ICD-10-CM | POA: Diagnosis not present

## 2018-09-14 DIAGNOSIS — Z170 Estrogen receptor positive status [ER+]: Secondary | ICD-10-CM | POA: Diagnosis not present

## 2018-09-17 DIAGNOSIS — Z170 Estrogen receptor positive status [ER+]: Secondary | ICD-10-CM | POA: Diagnosis not present

## 2018-09-17 DIAGNOSIS — Z9889 Other specified postprocedural states: Secondary | ICD-10-CM | POA: Diagnosis not present

## 2018-09-17 DIAGNOSIS — C50211 Malignant neoplasm of upper-inner quadrant of right female breast: Secondary | ICD-10-CM | POA: Diagnosis not present

## 2018-09-18 DIAGNOSIS — Z170 Estrogen receptor positive status [ER+]: Secondary | ICD-10-CM | POA: Diagnosis not present

## 2018-09-18 DIAGNOSIS — Z9889 Other specified postprocedural states: Secondary | ICD-10-CM | POA: Diagnosis not present

## 2018-09-18 DIAGNOSIS — C50211 Malignant neoplasm of upper-inner quadrant of right female breast: Secondary | ICD-10-CM | POA: Diagnosis not present

## 2018-09-19 DIAGNOSIS — C50211 Malignant neoplasm of upper-inner quadrant of right female breast: Secondary | ICD-10-CM | POA: Diagnosis not present

## 2018-09-19 DIAGNOSIS — Z9889 Other specified postprocedural states: Secondary | ICD-10-CM | POA: Diagnosis not present

## 2018-09-19 DIAGNOSIS — Z170 Estrogen receptor positive status [ER+]: Secondary | ICD-10-CM | POA: Diagnosis not present

## 2018-09-20 DIAGNOSIS — Z170 Estrogen receptor positive status [ER+]: Secondary | ICD-10-CM | POA: Diagnosis not present

## 2018-09-20 DIAGNOSIS — Z9889 Other specified postprocedural states: Secondary | ICD-10-CM | POA: Diagnosis not present

## 2018-09-20 DIAGNOSIS — C50211 Malignant neoplasm of upper-inner quadrant of right female breast: Secondary | ICD-10-CM | POA: Diagnosis not present

## 2018-09-21 DIAGNOSIS — Z9889 Other specified postprocedural states: Secondary | ICD-10-CM | POA: Diagnosis not present

## 2018-09-21 DIAGNOSIS — C50211 Malignant neoplasm of upper-inner quadrant of right female breast: Secondary | ICD-10-CM | POA: Diagnosis not present

## 2018-09-21 DIAGNOSIS — Z170 Estrogen receptor positive status [ER+]: Secondary | ICD-10-CM | POA: Diagnosis not present

## 2018-09-24 DIAGNOSIS — Z170 Estrogen receptor positive status [ER+]: Secondary | ICD-10-CM | POA: Diagnosis not present

## 2018-09-24 DIAGNOSIS — C50211 Malignant neoplasm of upper-inner quadrant of right female breast: Secondary | ICD-10-CM | POA: Diagnosis not present

## 2018-09-24 DIAGNOSIS — Z9889 Other specified postprocedural states: Secondary | ICD-10-CM | POA: Diagnosis not present

## 2018-09-25 DIAGNOSIS — Z170 Estrogen receptor positive status [ER+]: Secondary | ICD-10-CM | POA: Diagnosis not present

## 2018-09-25 DIAGNOSIS — C50211 Malignant neoplasm of upper-inner quadrant of right female breast: Secondary | ICD-10-CM | POA: Diagnosis not present

## 2018-09-25 DIAGNOSIS — Z9889 Other specified postprocedural states: Secondary | ICD-10-CM | POA: Diagnosis not present

## 2018-09-26 DIAGNOSIS — Z170 Estrogen receptor positive status [ER+]: Secondary | ICD-10-CM | POA: Diagnosis not present

## 2018-09-26 DIAGNOSIS — Z9889 Other specified postprocedural states: Secondary | ICD-10-CM | POA: Diagnosis not present

## 2018-09-26 DIAGNOSIS — C50211 Malignant neoplasm of upper-inner quadrant of right female breast: Secondary | ICD-10-CM | POA: Diagnosis not present

## 2018-09-27 DIAGNOSIS — C50211 Malignant neoplasm of upper-inner quadrant of right female breast: Secondary | ICD-10-CM | POA: Diagnosis not present

## 2018-09-27 DIAGNOSIS — Z170 Estrogen receptor positive status [ER+]: Secondary | ICD-10-CM | POA: Diagnosis not present

## 2018-09-27 DIAGNOSIS — Z9889 Other specified postprocedural states: Secondary | ICD-10-CM | POA: Diagnosis not present

## 2018-09-28 DIAGNOSIS — Z9889 Other specified postprocedural states: Secondary | ICD-10-CM | POA: Diagnosis not present

## 2018-09-28 DIAGNOSIS — C50211 Malignant neoplasm of upper-inner quadrant of right female breast: Secondary | ICD-10-CM | POA: Diagnosis not present

## 2018-09-28 DIAGNOSIS — Z170 Estrogen receptor positive status [ER+]: Secondary | ICD-10-CM | POA: Diagnosis not present

## 2018-10-02 ENCOUNTER — Other Ambulatory Visit: Payer: Self-pay | Admitting: Nurse Practitioner

## 2018-10-02 DIAGNOSIS — Z9889 Other specified postprocedural states: Secondary | ICD-10-CM | POA: Diagnosis not present

## 2018-10-02 DIAGNOSIS — Z170 Estrogen receptor positive status [ER+]: Secondary | ICD-10-CM | POA: Diagnosis not present

## 2018-10-02 DIAGNOSIS — G629 Polyneuropathy, unspecified: Secondary | ICD-10-CM

## 2018-10-02 DIAGNOSIS — C50211 Malignant neoplasm of upper-inner quadrant of right female breast: Secondary | ICD-10-CM | POA: Diagnosis not present

## 2018-10-02 DIAGNOSIS — F411 Generalized anxiety disorder: Secondary | ICD-10-CM

## 2018-10-02 DIAGNOSIS — F5101 Primary insomnia: Secondary | ICD-10-CM

## 2018-10-02 NOTE — Telephone Encounter (Signed)
Last refill without being seen 

## 2018-10-02 NOTE — Progress Notes (Signed)
Patient Care Team: Chevis Pretty, FNP as PCP - General (Nurse Practitioner) Vickey Huger, MD as Consulting Physician (Orthopedic Surgery) Huel Cote, NP as Nurse Practitioner (Obstetrics and Gynecology) Nicholas Lose, MD as Consulting Physician (Hematology and Oncology)  DIAGNOSIS:    ICD-10-CM   1. Malignant neoplasm of upper-inner quadrant of right breast in female, estrogen receptor positive ([place]) C50.211    Z17.0     SUMMARY OF ONCOLOGIC HISTORY:   Malignant neoplasm of upper-inner quadrant of right breast in female, estrogen receptor positive ([place])   04/25/2018 Initial Diagnosis    Abnormal mammogram on 04/17/2018, biopsy revealed invasive lobular cancer grade 2, ER 90%, PR 0%, Ki-67 10%, HER-2 negative, breast MRI revealed 8 mm biopsy-proven malignancy UIQ, additional findings 1.2 cm linear enhancement retroareolar, 8 mm enhancing mass posterior upper central right breast, 1.3 cm linear enhancement upper inner left breast, no abnormal lymph nodes, T1 b N0 stage Ia    05/31/2018 Cancer Staging    Staging form: Breast, AJCC 8th Edition - Clinical: Stage IA (cT1b(3), cN0, cM0, G2, ER+, PR-, HER2-) - Signed by Nicholas Lose, MD on 05/31/2018    07/05/2018 Surgery    Right lumpectomy: Invasive lobular cancer, grade 2, 0.8 cm, ALH, margins negative, 0/3 lymph nodes negative ER 90%, PR 0%, HER-2 negative, Ki-67 10%, T1 be N0 stage Ia    07/19/2018 Oncotype testing    Oncotype DX recurrence score 24, risk of distant recurrence of 9 years: 10%, low risk    07/25/2018 Cancer Staging    Staging form: Breast, AJCC 8th Edition - Pathologic: Stage IA (pT1b, pN0, cM0, G2, ER+, PR-, HER2-, Oncotype DX score: 24) - Signed by Gardenia Phlegm, NP on 07/25/2018    09/07/2018 - 10/03/2018 Radiation Therapy    Adjuvant XRT at [facility]    09/2018 -  Anti-estrogen oral therapy    Letrozole half of 2.91m tablet daily     CHIEF COMPLIANT: Follow-up prior to starting  radiation  INTERVAL HISTORY: Phyllis REASORis a 73y.o. with above-mentioned history of right breast cancer was treated with lumpectomy. Oncotype testing showed a score of 24 and a 10% chance of recurrence in the next 9 years. She presents to the clinic today to discuss radiation therapy. She started radiation on 09/07/18 and will finish today and notes redness at the site that is improving with topical cream. She questioned if taking anastrozole was necessary and is hesitant because when she previously took it she had significant hair loss. She agreed to take half a tablet starting next week. She lost 12lbs in the month of December, but reports it has now stabilized.   REVIEW OF SYSTEMS:   Constitutional: Denies fevers, chills (+) abnormal weight loss Eyes: Denies blurriness of vision Ears, nose, mouth, throat, and face: Denies mucositis or sore throat Respiratory: Denies cough, dyspnea or wheezes Cardiovascular: Denies palpitation, chest discomfort Gastrointestinal:  Denies nausea, heartburn or change in bowel habits Skin: (+) radiation dermatitis Lymphatics: Denies new lymphadenopathy or easy bruising Neurological: Denies numbness, tingling or new weaknesses Behavioral/Psych: Mood is stable, no new changes  Extremities: No lower extremity edema Breast: denies any pain or lumps or nodules in either breasts All other systems were reviewed with the patient and are negative.  I have reviewed the past medical history, past surgical history, social history and family history with the patient and they are unchanged from previous note.  ALLERGIES:  is allergic to amoxicillin-pot clavulanate.  MEDICATIONS:  Current Outpatient Medications  Medication Sig Dispense Refill  . ALPRAZolam (XANAX) 0.25 MG tablet TAKE (1) TABLET TWICE A DAY AS NEEDED. 60 tablet 0  . benazepril-hydrochlorthiazide (LOTENSIN HCT) 20-12.5 MG tablet Take 1 tablet by mouth daily. (Patient taking differently: Take 0.5 tablets  by mouth daily. ) 90 tablet 1  . gabapentin (NEURONTIN) 100 MG capsule TAKE (1) CAPSULE THREE TIMES DAILY. 90 capsule 0  . letrozole (FEMARA) 2.5 MG tablet Take 1 tablet (2.5 mg total) by mouth daily. 90 tablet 3  . levothyroxine (SYNTHROID, LEVOTHROID) 75 MCG tablet Take 1 tablet (75 mcg total) by mouth daily. 90 tablet 1  . Omega-3 Fatty Acids (FISH OIL) 1200 MG CAPS Take 1,200 mg by mouth daily.    [name] PROAIR HFA 108 (90 Base) MCG/ACT inhaler Inhale 2 puffs in lungs every 6 hours as needed for wheezing or shortness of breath. 8.5 g 0  . Red Yeast Rice 600 MG CAPS Take by mouth daily.    [name] rOPINIRole (REQUIP) 0.25 MG tablet TAKE (1) TABLET THREE TIMES DAILY. 270 tablet 0  . sertraline (ZOLOFT) 50 MG tablet TAKE 1 TABLET DAILY 90 tablet 0  . zolpidem (AMBIEN) 10 MG tablet TAKE 1 TABLET AT BEDTIME AS NEEDED FOR SLEEP 30 tablet 0   Current Facility-Administered Medications  Medication Dose Route Frequency Provider Last Rate Last Dose  . cyanocobalamin ((VITAMIN B-12)) injection 1,000 mcg  1,000 mcg Intramuscular Q30 days Chevis Pretty, FNP   1,000 mcg at 08/31/18 7741    PHYSICAL EXAMINATION: ECOG PERFORMANCE STATUS: 0 - Asymptomatic  Vitals:   10/03/18 1420  BP: 133/64  Pulse: (!) 101  Resp: 18  Temp: (!) 97.5 F (36.4 C)  SpO2: 96%   Filed Weights   10/03/18 1420  Weight: 167 lb 3.2 oz (75.8 kg)    GENERAL: alert, no distress and comfortable SKIN: skin color, texture, turgor are normal, no rashes or significant lesions EYES: normal, Conjunctiva are pink and non-injected, sclera clear OROPHARYNX: no exudate, no erythema and lips, buccal mucosa, and tongue normal  NECK: supple, thyroid normal size, non-tender, without nodularity LYMPH: no palpable lymphadenopathy in the cervical, axillary or inguinal LUNGS: clear to auscultation and percussion with normal breathing effort HEART: regular rate & rhythm and no murmurs and no lower extremity edema ABDOMEN: abdomen soft,  non-tender and normal bowel sounds MUSCULOSKELETAL: no cyanosis of digits and no clubbing  NEURO: alert & oriented x 3 with fluent speech, no focal motor/sensory deficits EXTREMITIES: No lower extremity edema Breast: Radiation dermatitis  LABORATORY DATA:  I have reviewed the data as listed CMP Latest Ref Rng & Units 07/04/2018 04/02/2018 12/26/2017  Glucose 70 - 99 mg/dL 84 93 88  BUN 8 - 23 mg/dL '13 14 14  ' Creatinine 0.44 - 1.00 mg/dL 0.78 0.77 0.76  Sodium 135 - 145 mmol/L 143 142 144  Potassium 3.5 - 5.1 mmol/L 3.5 4.5 3.9  Chloride 98 - 111 mmol/L 104 102 102  CO2 22 - 32 mmol/L '28 27 28  ' Calcium 8.9 - 10.3 mg/dL 8.9 9.3 9.3  Total Protein 6.0 - 8.5 g/dL - 6.9 6.6  Total Bilirubin 0.0 - 1.2 mg/dL - 0.4 0.3  Alkaline Phos 39 - 117 IU/L - 102 99  AST 0 - 40 IU/L - 15 19  ALT 0 - 32 IU/L - 9 10    Lab Results  Component Value Date   WBC 6.8 04/02/2018   HGB 12.0 04/02/2018   HCT 36.2 04/02/2018   MCV 86 04/02/2018  PLT 294 04/02/2018   NEUTROABS 3.9 04/02/2018    ASSESSMENT & PLAN:  Malignant neoplasm of upper-inner quadrant of right breast in female, estrogen receptor positive ([place]) 07/05/2018: Right lumpectomy: Invasive lobular cancer, grade 2, 0.8 cm, ALH, margins negative, 0/3 lymph nodes negative ER 90%, PR 0%, HER-2 negative, Ki-67 10%, T1 be N0 stage Ia Oncotype DX recurrence score 24, risk of distant recurrence of 9 years: 10%, low risk Adjuvant radiation therapy in [place] completed 10/03/2018  Treatment plan: Antiestrogen therapy with letrozole 2.5 mg daily.  She started letrozole prior to surgery. Letrozole toxicities: Previously she had hair loss from letrozole. I instructed her to take half a tablet daily. She did not have any hot flashes or myalgias previously.  Return to clinic in 3 months for follow-up with survivorship care plan visit and after that we can see her in 6 months and after that once a year.     No orders of the defined types were placed in  this encounter.  The patient has a good understanding of the overall plan. she agrees with it. she will call with any problems that may develop before the next visit here.  Nicholas Lose, MD 10/03/2018  Julious Oka Dorshimer am acting as scribe for Dr. Nicholas Lose.  I have reviewed the above documentation for accuracy and completeness, and I agree with the above.

## 2018-10-02 NOTE — Telephone Encounter (Signed)
Patient aware and verbalizes understanding- has apt this week to see MMM.

## 2018-10-03 ENCOUNTER — Inpatient Hospital Stay: Payer: Medicare HMO | Attending: Hematology and Oncology | Admitting: Hematology and Oncology

## 2018-10-03 ENCOUNTER — Telehealth: Payer: Self-pay | Admitting: Hematology and Oncology

## 2018-10-03 DIAGNOSIS — C50211 Malignant neoplasm of upper-inner quadrant of right female breast: Secondary | ICD-10-CM | POA: Diagnosis not present

## 2018-10-03 DIAGNOSIS — Z170 Estrogen receptor positive status [ER+]: Secondary | ICD-10-CM | POA: Diagnosis not present

## 2018-10-03 DIAGNOSIS — Z79899 Other long term (current) drug therapy: Secondary | ICD-10-CM | POA: Diagnosis not present

## 2018-10-03 DIAGNOSIS — Z9889 Other specified postprocedural states: Secondary | ICD-10-CM | POA: Diagnosis not present

## 2018-10-03 NOTE — Telephone Encounter (Signed)
Gave avs and calendar ° °

## 2018-10-03 NOTE — Assessment & Plan Note (Addendum)
07/05/2018: Right lumpectomy: Invasive lobular cancer, grade 2, 0.8 cm, ALH, margins negative, 0/3 lymph nodes negative ER 90%, PR 0%, HER-2 negative, Ki-67 10%, T1 be N0 stage Ia Oncotype DX recurrence score 24, risk of distant recurrence of 9 years: 10%, low risk Adjuvant radiation therapy in [place] completed 10/03/2018  Treatment plan: Antiestrogen therapy with letrozole 2.5 mg daily.  She started letrozole prior to surgery. Letrozole toxicities: Previously she had hair loss from letrozole. I instructed her to take half a tablet daily. She did not have any hot flashes or myalgias previously.  Return to clinic in 3 months for follow-up with survivorship care plan visit and after that we can see her in 6 months and after that once a year.

## 2018-10-04 ENCOUNTER — Encounter: Payer: Self-pay | Admitting: Nurse Practitioner

## 2018-10-04 ENCOUNTER — Ambulatory Visit (INDEPENDENT_AMBULATORY_CARE_PROVIDER_SITE_OTHER): Payer: Medicare HMO | Admitting: Nurse Practitioner

## 2018-10-04 VITALS — BP 112/71 | HR 77 | Temp 97.0°F | Ht 64.0 in | Wt 167.0 lb

## 2018-10-04 DIAGNOSIS — E034 Atrophy of thyroid (acquired): Secondary | ICD-10-CM | POA: Diagnosis not present

## 2018-10-04 DIAGNOSIS — Z170 Estrogen receptor positive status [ER+]: Secondary | ICD-10-CM

## 2018-10-04 DIAGNOSIS — E785 Hyperlipidemia, unspecified: Secondary | ICD-10-CM

## 2018-10-04 DIAGNOSIS — I10 Essential (primary) hypertension: Secondary | ICD-10-CM

## 2018-10-04 DIAGNOSIS — D649 Anemia, unspecified: Secondary | ICD-10-CM

## 2018-10-04 DIAGNOSIS — G2581 Restless legs syndrome: Secondary | ICD-10-CM | POA: Diagnosis not present

## 2018-10-04 DIAGNOSIS — G629 Polyneuropathy, unspecified: Secondary | ICD-10-CM

## 2018-10-04 DIAGNOSIS — E538 Deficiency of other specified B group vitamins: Secondary | ICD-10-CM

## 2018-10-04 DIAGNOSIS — R69 Illness, unspecified: Secondary | ICD-10-CM | POA: Diagnosis not present

## 2018-10-04 DIAGNOSIS — F5101 Primary insomnia: Secondary | ICD-10-CM

## 2018-10-04 DIAGNOSIS — F411 Generalized anxiety disorder: Secondary | ICD-10-CM

## 2018-10-04 DIAGNOSIS — Z6831 Body mass index (BMI) 31.0-31.9, adult: Secondary | ICD-10-CM | POA: Diagnosis not present

## 2018-10-04 DIAGNOSIS — C50211 Malignant neoplasm of upper-inner quadrant of right female breast: Secondary | ICD-10-CM | POA: Diagnosis not present

## 2018-10-04 DIAGNOSIS — F3342 Major depressive disorder, recurrent, in full remission: Secondary | ICD-10-CM

## 2018-10-04 MED ORDER — LEVOTHYROXINE SODIUM 75 MCG PO TABS: 75.0000 ug | ORAL_TABLET | Freq: Every day | ORAL | 1 refills | Status: DC

## 2018-10-04 MED ORDER — GABAPENTIN 100 MG PO CAPS: 100.0000 mg | ORAL_CAPSULE | Freq: Three times a day (TID) | ORAL | 1 refills | Status: DC

## 2018-10-04 MED ORDER — ZOLPIDEM TARTRATE 10 MG PO TABS: 10.0000 mg | ORAL_TABLET | Freq: Every evening | ORAL | 5 refills | Status: DC | PRN

## 2018-10-04 MED ORDER — ALPRAZOLAM 0.25 MG PO TABS: 0.2500 mg | ORAL_TABLET | Freq: Two times a day (BID) | ORAL | 5 refills | Status: DC | PRN

## 2018-10-04 MED ORDER — ROPINIROLE HCL 0.25 MG PO TABS: 0.2500 mg | ORAL_TABLET | Freq: Three times a day (TID) | ORAL | 1 refills | Status: DC

## 2018-10-04 MED ORDER — BENAZEPRIL-HYDROCHLOROTHIAZIDE 20-12.5 MG PO TABS: 0.5000 | ORAL_TABLET | Freq: Every day | ORAL | 1 refills | Status: DC

## 2018-10-04 MED ORDER — SERTRALINE HCL 50 MG PO TABS: ORAL_TABLET | ORAL | 1 refills | Status: DC

## 2018-10-04 NOTE — Progress Notes (Signed)
Patient ID: Phyllis Walker, female   DOB: 1946/06/19, 73 y.o.   MRN: [id number]  Subjective:    Subjective   Patient ID: Phyllis Walker, female    DOB: 10-21-1945, 73 y.o.   MRN: [id number]   Chief Complaint: Medical Management of Chronic Issues   HPI:  1. Hypertension, benign essential, goal below 140/90  No c/o chest pain, sob or headache. Does not check blood pressure at home.    BP Readings from Last 3 Encounters:  10/04/18 112/71  10/03/18 133/64  07/12/18 (!) 152/68     2. Hypothyroidism due to acquired atrophy of thyroid  No problems that she is aware of.  3. Neuropathy  She says manly in her feet an is usually at night.  4. Restless leg syndrome  She is still on requip and that really helps at night .  5. Primary insomnia  Takes ambien to sleep at  Night. She is unable to sleep without it.  6. Hyperlipidemia with target LDL less than 100  She watches diet and exercises a couple of times a week.  7. GAD (generalized anxiety disorder)  Takes her xanax 2x a day. Especially since she was dx with breast cancer.  8. Recurrent major depressive disorder, in full remission ([place])  She [place] still on zoloft and is doing well. She says she could not have made it thrrough this caner dx without it.  9. Malignant neoplasm of upper-inner quadrant of right breast in female, estrogen receptor positive ([place]) had lumpectomy in October and and she took her last radiation yeserday. She did not have to do chemo because lymph nodes were all negaitive  10. BMI 31.0-31.9,adult  No recent weight changes  11. Anemia, unspecified type  Last hgb was 12.0        Outpatient Encounter Medications as of 10/04/2018  Medication Sig  . ALPRAZolam (XANAX) 0.25 MG tablet TAKE (1) TABLET TWICE A DAY AS NEEDED.  [name] benazepril-hydrochlorthiazide (LOTENSIN HCT) 20-12.5 MG tablet Take 1 tablet by mouth daily. (Patient taking differently: Take 0.5 tablets by mouth daily. )  . Flaxseed, Linseed, (EQL  FLAXSEED OIL) 1200 MG CAPS Take by mouth.  . gabapentin (NEURONTIN) 100 MG capsule TAKE (1) CAPSULE THREE TIMES DAILY.  [name] letrozole (FEMARA) 2.5 MG tablet Take 1 tablet (2.5 mg total) by mouth daily. (Patient taking differently: Take 2.5 mg by mouth daily. Take 1/2 tab daily)  . levothyroxine (SYNTHROID, LEVOTHROID) 75 MCG tablet Take 1 tablet (75 mcg total) by mouth daily.  . Omega-3 Fatty Acids (FISH OIL) 1200 MG CAPS Take 1,200 mg by mouth daily.  [name] PROAIR HFA 108 (90 Base) MCG/ACT inhaler Inhale 2 puffs in lungs every 6 hours as needed for wheezing or shortness of breath.  [name] rOPINIRole (REQUIP) 0.25 MG tablet TAKE (1) TABLET THREE TIMES DAILY.  [name] sertraline (ZOLOFT) 50 MG tablet TAKE 1 TABLET DAILY  . zolpidem (AMBIEN) 10 MG tablet TAKE 1 TABLET AT BEDTIME AS NEEDED FOR SLEEP       New complaints: Non today  Social history: Lives alone. Has family in [place]   Review of Systems  Constitutional: Negative for activity change and appetite change.  HENT: Negative.   Eyes: Negative for pain.  Respiratory: Negative for shortness of breath.   Cardiovascular: Negative for chest pain, palpitations and leg swelling.  Gastrointestinal: Negative for abdominal pain.  Endocrine: Negative for polydipsia.  Genitourinary: Negative.   Skin: Negative for rash.  Neurological: Negative for dizziness, weakness and headaches.  Hematological: Does not bruise/bleed easily.  Psychiatric/Behavioral: Negative.   All other systems reviewed and are negative.      Objective:   Objective   Physical Exam Vitals signs and nursing note reviewed.  Constitutional:      General: She is not in acute distress.    Appearance: Normal appearance. She is well-developed.  HENT:     Head: Normocephalic.     Nose: Nose normal.  Eyes:     Pupils: Pupils are equal, round, and reactive to light.  Neck:     Musculoskeletal: Normal range of motion and neck supple.     Vascular: No carotid bruit  or JVD.  Cardiovascular:     Rate and Rhythm: Normal rate and regular rhythm.     Heart sounds: Normal heart sounds.  Pulmonary:     Effort: Pulmonary effort is normal. No respiratory distress.     Breath sounds: Normal breath sounds. No wheezing or rales.  Chest:     Chest wall: No tenderness.  Abdominal:     General: Bowel sounds are normal. There is no distension or abdominal bruit.     Palpations: Abdomen is soft. There is no hepatomegaly, splenomegaly, mass or pulsatile mass.     Tenderness: There is no abdominal tenderness.  Musculoskeletal: Normal range of motion.  Lymphadenopathy:     Cervical: No cervical adenopathy.  Skin:    General: Skin is warm and dry.  Neurological:     Mental Status: She is alert and oriented to person, place, and time.     Deep Tendon Reflexes: Reflexes are normal and symmetric.  Psychiatric:        Behavior: Behavior normal.        Thought Content: Thought content normal.        Judgment: Judgment normal.    BP 112/71   Pulse 77   Temp (!) 97 F (36.1 C) (Oral)   Ht 5\' 4"  (1.626 m)   Wt 167 lb (75.8 kg)   BMI 28.67 kg/m     Assessment & Plan:  JAEMARIE HOCHBERG comes in today with chief complaint of Medical Management of Chronic Issues   Diagnosis and orders addressed:  1. Hypertension, benign essential, goal below 140/90 Low sodium diet - benazepril-hydrochlorthiazide (LOTENSIN HCT) 20-12.5 MG tablet; Take 0.5 tablets by mouth daily.  Dispense: 90 tablet; Refill: 1  2. Hypothyroidism due to acquired atrophy of thyroid - levothyroxine (SYNTHROID, LEVOTHROID) 75 MCG tablet; Take 1 tablet (75 mcg total) by mouth daily.  Dispense: 90 tablet; Refill: 1  3. Neuropathy Do not go bare  footed Check feet daily for lesions - gabapentin (NEURONTIN) 100 MG capsule; Take 1 capsule (100 mg total) by mouth 3 (three) times daily.  Dispense: 270 capsule; Refill: 1  4. Restless leg syndrome Keep legs warm - rOPINIRole (REQUIP) 0.25 MG tablet;  Take 1 tablet (0.25 mg total) by mouth 3 (three) times daily.  Dispense: 270 tablet; Refill: 1  5. Primary insomnia Bedtime routine - zolpidem (AMBIEN) 10 MG tablet; Take 1 tablet (10 mg total) by mouth at bedtime as needed. for sleep  Dispense: 30 tablet; Refill: 5  6. Hyperlipidemia with target LDL less than 100 Low fat diet  7. GAD (generalized anxiety disorder) Stress management - ALPRAZolam (XANAX) 0.25 MG tablet; Take 1 tablet (0.25 mg total) by mouth 2 (two) times daily as needed for anxiety.  Dispense: 60 tablet; Refill: 5  8. Recurrent major depressive disorder, in full remission ([place]) - sertraline (  ZOLOFT) 50 MG tablet; TAKE 1 TABLET DAILY  Dispense: 90 tablet; Refill: 1  9. Malignant neoplasm of upper-inner quadrant of right breast in female, estrogen receptor positive ([place]) Keep oncology follow up  10. BMI 31.0-31.9,adult Discussed diet and exercise for person with BMI >25 Will recheck weight in 3-6 months  11. Anemia, unspecified type Labs pending   Labs pending Health Maintenance reviewed Diet and exercise encouraged  Follow up plan: 3 months   Mary-Margaret Hassell Done, FNP

## 2018-10-04 NOTE — Patient Instructions (Signed)

## 2018-10-04 NOTE — Addendum Note (Signed)
Addended by: Rolena Infante on: 10/04/2018 12:48 PM   Modules accepted: Orders

## 2018-10-05 LAB — CMP14+EGFR
ALT: 9 IU/L (ref 0–32)
AST: 20 IU/L (ref 0–40)
Albumin/Globulin Ratio: 1.5 (ref 1.2–2.2)
Albumin: 3.8 g/dL (ref 3.7–4.7)
Alkaline Phosphatase: 88 IU/L (ref 39–117)
BUN/Creatinine Ratio: 18 (ref 12–28)
BUN: 14 mg/dL (ref 8–27)
Bilirubin Total: 0.3 mg/dL (ref 0.0–1.2)
CO2: 25 mmol/L (ref 20–29)
Calcium: 8.9 mg/dL (ref 8.7–10.3)
Chloride: 100 mmol/L (ref 96–106)
Creatinine, Ser: 0.76 mg/dL (ref 0.57–1.00)
GFR calc Af Amer: 90 mL/min/{1.73_m2} (ref >59)
GFR calc non Af Amer: 78 mL/min/{1.73_m2} (ref >59)
Globulin, Total: 2.5 g/dL (ref 1.5–4.5)
Glucose: 82 mg/dL (ref 65–99)
Potassium: 4 mmol/L (ref 3.5–5.2)
SODIUM: 141 mmol/L (ref 134–144)
TOTAL PROTEIN: 6.3 g/dL (ref 6.0–8.5)

## 2018-10-05 LAB — LIPID PANEL
Chol/HDL Ratio: 4.3 ratio (ref 0.0–4.4)
Cholesterol, Total: 240 mg/dL — ABNORMAL HIGH (ref 100–199)
HDL: 56 mg/dL (ref >39)
LDL Calculated: 160 mg/dL — ABNORMAL HIGH (ref 0–99)
Triglycerides: 120 mg/dL (ref 0–149)
VLDL Cholesterol Cal: 24 mg/dL (ref 5–40)

## 2018-10-05 LAB — CBC WITH DIFFERENTIAL/PLATELET
Basophils Absolute: 0.1 10*3/uL (ref 0.0–0.2)
Basos: 2 %
EOS (ABSOLUTE): 0.4 10*3/uL (ref 0.0–0.4)
Eos: 9 %
Hematocrit: 38.5 % (ref 34.0–46.6)
Hemoglobin: 12.6 g/dL (ref 11.1–15.9)
IMMATURE GRANULOCYTES: 0 %
Immature Grans (Abs): 0 10*3/uL (ref 0.0–0.1)
Lymphocytes Absolute: 0.9 10*3/uL (ref 0.7–3.1)
Lymphs: 18 %
MCH: 27.4 pg (ref 26.6–33.0)
MCHC: 32.7 g/dL (ref 31.5–35.7)
MCV: 84 fL (ref 79–97)
MONOS ABS: 0.5 10*3/uL (ref 0.1–0.9)
Monocytes: 9 %
Neutrophils Absolute: 3.1 10*3/uL (ref 1.4–7.0)
Neutrophils: 62 %
Platelets: 193 10*3/uL (ref 150–450)
RBC: 4.6 x10E6/uL (ref 3.77–5.28)
RDW: 14.8 % (ref 11.7–15.4)
WBC: 5 10*3/uL (ref 3.4–10.8)

## 2018-10-05 LAB — THYROID PANEL WITH TSH
Free Thyroxine Index: 2.4 (ref 1.2–4.9)
T3 UPTAKE RATIO: 23 % — AB (ref 24–39)
T4, Total: 10.3 ug/dL (ref 4.5–12.0)
TSH: 2.08 u[IU]/mL (ref 0.450–4.500)

## 2018-10-09 ENCOUNTER — Encounter: Payer: Self-pay | Admitting: *Deleted

## 2018-10-20 ENCOUNTER — Encounter: Payer: Self-pay | Admitting: Family

## 2018-10-20 ENCOUNTER — Ambulatory Visit (INDEPENDENT_AMBULATORY_CARE_PROVIDER_SITE_OTHER): Payer: Medicare HMO | Admitting: Family

## 2018-10-20 VITALS — BP 123/66 | HR 107 | Temp 97.7°F | Ht 64.0 in | Wt 162.0 lb

## 2018-10-20 DIAGNOSIS — J189 Pneumonia, unspecified organism: Secondary | ICD-10-CM

## 2018-10-20 MED ORDER — PREDNISONE 10 MG (21) PO TBPK: ORAL_TABLET | ORAL | 0 refills | Status: DC

## 2018-10-20 MED ORDER — BENZONATATE 200 MG PO CAPS: 200.0000 mg | ORAL_CAPSULE | Freq: Three times a day (TID) | ORAL | 1 refills | Status: DC | PRN

## 2018-10-20 MED ORDER — LEVOFLOXACIN 500 MG PO TABS: 500.0000 mg | ORAL_TABLET | Freq: Every day | ORAL | 0 refills | Status: DC

## 2018-10-20 NOTE — Patient Instructions (Signed)
Community-Acquired Pneumonia, Adult  Pneumonia is an infection of the lungs. It causes swelling in the airways of the lungs. Mucus and fluid may also build up inside the airways.  One type of pneumonia can happen while a person is in a hospital. A different type can happen when a person is not in a hospital (community-acquired pneumonia).   What are the causes?    This condition is caused by germs (viruses, bacteria, or fungi). Some types of germs can be passed from one person to another. This can happen when you breathe in droplets from the cough or sneeze of an infected person.  What increases the risk?  You are more likely to develop this condition if you:   Have a long-term (chronic) disease, such as:  ? Chronic obstructive pulmonary disease (COPD).  ? Asthma.  ? Cystic fibrosis.  ? Congestive heart failure.  ? Diabetes.  ? Kidney disease.   Have HIV.   Have sickle cell disease.   Have had your spleen removed.   Do not take good care of your teeth and mouth (poor dental hygiene).   Have a medical condition that increases the risk of breathing in droplets from your own mouth and nose.   Have a weakened body defense system (immune system).   Are a smoker.   Travel to areas where the germs that cause this illness are common.   Are around certain animals or the places they live.  What are the signs or symptoms?   A dry cough.   A wet (productive) cough.   Fever.   Sweating.   Chest pain. This often happens when breathing deeply or coughing.   Fast breathing or trouble breathing.   Shortness of breath.   Shaking chills.   Feeling tired (fatigue).   Muscle aches.  How is this treated?  Treatment for this condition depends on many things. Most adults can be treated at home. In some cases, treatment must happen in a hospital. Treatment may include:   Medicines given by mouth or through an IV tube.   Being given extra oxygen.   Respiratory therapy.  In rare cases, treatment for very bad pneumonia  may include:   Using a machine to help you breathe.   Having a procedure to remove fluid from around your lungs.  Follow these instructions at home:  Medicines   Take over-the-counter and prescription medicines only as told by your doctor.  ? Only take cough medicine if you are losing sleep.   If you were prescribed an antibiotic medicine, take it as told by your doctor. Do not stop taking the antibiotic even if you start to feel better.  General instructions     Sleep with your head and neck raised (elevated). You can do this by sleeping in a recliner or by putting a few pillows under your head.   Rest as needed. Get at least 8 hours of sleep each night.   Drink enough water to keep your pee (urine) pale yellow.   Eat a healthy diet that includes plenty of vegetables, fruits, whole grains, low-fat dairy products, and lean protein.   Do not use any products that contain nicotine or tobacco. These include cigarettes, e-cigarettes, and chewing tobacco. If you need help quitting, ask your doctor.   Keep all follow-up visits as told by your doctor. This is important.  How is this prevented?  A shot (vaccine) can help prevent pneumonia. Shots are often suggested for:   People   older than 74 years of age.   People older than 74 years of age who:  ? Are having cancer treatment.  ? Have long-term (chronic) lung disease.  ? Have problems with their body's defense system.  You may also prevent pneumonia if you take these actions:   Get the flu (influenza) shot every year.   Go to the dentist as often as told.   Wash your hands often. If you cannot use soap and water, use hand sanitizer.  Contact a doctor if:   You have a fever.   You lose sleep because your cough medicine does not help.  Get help right away if:   You are short of breath and it gets worse.   You have more chest pain.   Your sickness gets worse. This is very serious if:  ? You are an older adult.  ? Your body's defense system is weak.   You  cough up blood.  Summary   Pneumonia is an infection of the lungs.   Most adults can be treated at home. Some will need treatment in a hospital.   Drink enough water to keep your pee pale yellow.   Get at least 8 hours of sleep each night.  This information is not intended to replace advice given to you by your health care provider. Make sure you discuss any questions you have with your health care provider.  Document Released: 02/15/2008 Document Revised: 04/26/2018 Document Reviewed: 04/26/2018  Elsevier Interactive Patient Education  2019 [employer]

## 2018-10-20 NOTE — Progress Notes (Signed)
Subjective:    Patient ID: Phyllis Walker, female    DOB: [birth year], 73 y.o.   MRN: [id number]  Chief Complaint  Patient presents with  . cough and congestion    Cough  This is a new problem. The current episode started 1 to 4 weeks ago. The problem has been gradually worsening. The problem occurs every few minutes. The cough is productive of sputum and productive of purulent sputum. Associated symptoms include headaches, postnasal drip, rhinorrhea, shortness of breath and wheezing. Pertinent negatives include no chills, ear congestion, ear pain, fever, myalgias, nasal congestion or sore throat. The symptoms are aggravated by lying down. She has tried rest and OTC cough suppressant for the symptoms. The treatment provided mild relief.      Review of Systems  Constitutional: Negative for chills and fever.  HENT: Positive for postnasal drip and rhinorrhea. Negative for ear pain and sore throat.   Respiratory: Positive for cough, shortness of breath and wheezing.   Musculoskeletal: Negative for myalgias.  Neurological: Positive for headaches.  All other systems reviewed and are negative.      Objective:   Physical Exam Vitals signs reviewed.  Constitutional:      General: She is not in acute distress.    Appearance: She is well-developed.  HENT:     Head: Normocephalic and atraumatic.     Right Ear: Tympanic membrane normal.     Left Ear: Tympanic membrane normal.  Eyes:     Pupils: Pupils are equal, round, and reactive to light.  Neck:     Musculoskeletal: Normal range of motion and neck supple.     Thyroid: No thyromegaly.  Cardiovascular:     Rate and Rhythm: Normal rate and regular rhythm.     Heart sounds: Normal heart sounds. No murmur.  Pulmonary:     Effort: Pulmonary effort is normal. No respiratory distress.     Breath sounds: Rales present. No wheezing.  Abdominal:     General: Bowel sounds are normal. There is no distension.     Palpations: Abdomen is soft.       Tenderness: There is no abdominal tenderness.  Musculoskeletal: Normal range of motion.        General: No tenderness.  Skin:    General: Skin is warm and dry.  Neurological:     Mental Status: She is alert and oriented to person, place, and time.     Cranial Nerves: No cranial nerve deficit.     Deep Tendon Reflexes: Reflexes are normal and symmetric.  Psychiatric:        Behavior: Behavior normal.        Thought Content: Thought content normal.        Judgment: Judgment normal.     BP 123/66   Pulse (!) 107   Temp 97.7 F (36.5 C) (Oral)   Ht 5\' 4"  (1.626 m)   Wt 162 lb (73.5 kg)   BMI 27.81 kg/m      Assessment & Plan:  Phyllis Walker comes in today with chief complaint of cough and congestion   Diagnosis and orders addressed:  1. Community acquired pneumonia, unspecified laterality - Take meds as prescribed - Use a cool mist humidifier  -Use saline nose sprays frequently -Force fluids -For any cough or congestion  Use plain Mucinex- regular strength or max strength is fine -For fever or aces or pains- take tylenol or ibuprofen. -Throat lozenges if help -RTO if symptoms worsen or do not improve -  levofloxacin (LEVAQUIN) 500 MG tablet; Take 1 tablet (500 mg total) by mouth daily.  Dispense: 7 tablet; Refill: 0 - predniSONE (STERAPRED UNI-PAK 21 TAB) 10 MG (21) TBPK tablet; Use as directed  Dispense: 21 tablet; Refill: 0 - benzonatate (TESSALON) 200 MG capsule; Take 1 capsule (200 mg total) by mouth 3 (three) times daily as needed.  Dispense: 30 capsule; Refill: [place], FNP

## 2018-11-02 ENCOUNTER — Encounter: Payer: Self-pay | Admitting: *Deleted

## 2018-11-05 ENCOUNTER — Ambulatory Visit (INDEPENDENT_AMBULATORY_CARE_PROVIDER_SITE_OTHER): Payer: Medicare HMO | Admitting: *Deleted

## 2018-11-05 DIAGNOSIS — E538 Deficiency of other specified B group vitamins: Secondary | ICD-10-CM | POA: Diagnosis not present

## 2018-11-05 NOTE — Progress Notes (Signed)
Pt given Cyanocobalamin inj Tolerated well 

## 2018-11-07 DIAGNOSIS — C50211 Malignant neoplasm of upper-inner quadrant of right female breast: Secondary | ICD-10-CM | POA: Diagnosis not present

## 2018-11-07 DIAGNOSIS — Z9889 Other specified postprocedural states: Secondary | ICD-10-CM | POA: Diagnosis not present

## 2018-11-07 DIAGNOSIS — Z170 Estrogen receptor positive status [ER+]: Secondary | ICD-10-CM | POA: Diagnosis not present

## 2018-11-19 DIAGNOSIS — R69 Illness, unspecified: Secondary | ICD-10-CM | POA: Diagnosis not present

## 2018-11-30 ENCOUNTER — Other Ambulatory Visit: Payer: Self-pay

## 2018-11-30 ENCOUNTER — Ambulatory Visit (INDEPENDENT_AMBULATORY_CARE_PROVIDER_SITE_OTHER): Payer: Medicare HMO | Admitting: *Deleted

## 2018-11-30 DIAGNOSIS — E538 Deficiency of other specified B group vitamins: Secondary | ICD-10-CM | POA: Diagnosis not present

## 2018-11-30 NOTE — Progress Notes (Signed)
Pt given Cyanocobalamin inj Tolerated well 

## 2018-12-31 ENCOUNTER — Other Ambulatory Visit: Payer: Self-pay

## 2018-12-31 ENCOUNTER — Ambulatory Visit (INDEPENDENT_AMBULATORY_CARE_PROVIDER_SITE_OTHER): Payer: Medicare HMO

## 2018-12-31 DIAGNOSIS — E538 Deficiency of other specified B group vitamins: Secondary | ICD-10-CM

## 2019-01-01 ENCOUNTER — Telehealth: Payer: Self-pay | Admitting: *Deleted

## 2019-01-01 NOTE — Telephone Encounter (Signed)
Per 4/20 I have rescheduled the appt on 5/26 to 5/22. Appt changed to a WebEx visit

## 2019-01-10 ENCOUNTER — Other Ambulatory Visit: Payer: Self-pay

## 2019-01-10 ENCOUNTER — Ambulatory Visit (INDEPENDENT_AMBULATORY_CARE_PROVIDER_SITE_OTHER): Payer: Medicare HMO | Admitting: Family

## 2019-01-10 ENCOUNTER — Encounter: Payer: Self-pay | Admitting: Family

## 2019-01-10 DIAGNOSIS — R0602 Shortness of breath: Secondary | ICD-10-CM | POA: Diagnosis not present

## 2019-01-10 DIAGNOSIS — J209 Acute bronchitis, unspecified: Secondary | ICD-10-CM

## 2019-01-10 DIAGNOSIS — C50211 Malignant neoplasm of upper-inner quadrant of right female breast: Secondary | ICD-10-CM | POA: Diagnosis not present

## 2019-01-10 DIAGNOSIS — Z170 Estrogen receptor positive status [ER+]: Secondary | ICD-10-CM | POA: Diagnosis not present

## 2019-01-10 MED ORDER — ALBUTEROL SULFATE HFA 108 (90 BASE) MCG/ACT IN AERS: INHALATION_SPRAY | RESPIRATORY_TRACT | 0 refills | Status: DC

## 2019-01-10 MED ORDER — PREDNISONE 10 MG (21) PO TBPK: ORAL_TABLET | ORAL | 0 refills | Status: DC

## 2019-01-10 NOTE — Progress Notes (Signed)
Virtual Visit via telephone Note  I connected with Phyllis Walker on 01/10/19 at 10:15 Am by telephone and verified that I am speaking with the correct person using two identifiers. EARLY ORD is currently located at  Home  and no one  is currently with her during visit. The provider, Evelina Dun, FNP is located in their office at time of visit.  I discussed the limitations, risks, security and privacy concerns of performing an evaluation and management service by telephone and the availability of in person appointments. I also discussed with the patient that there may be a patient responsible charge related to this service. The patient expressed understanding and agreed to proceed.   History and Present Illness:   PT calls the office today discuss SOB and CAP. PT has completed radiation on her right breast for cancer.   She states she has lost 30lb since January. She was seen on 11/09/18 and diagnosed with CAP and started Levaquin and prednisone. She states she felt much better.   She reports about 4 weeks ago she noticed an increase in her SOB, and intermittent coughing.    Complaining of fatigue since January.  Shortness of Breath  This is a recurrent problem. The current episode started 1 to 4 weeks ago. The problem has been gradually improving. Pertinent negatives include no ear pain, fever or sore throat. The symptoms are aggravated by pollens. Associated symptoms comments: Sneezing . She has tried rest for the symptoms. The treatment provided mild relief.      Review of Systems  Constitutional: Positive for fatigue. Negative for fever.  HENT: Negative for ear pain and sore throat.   Respiratory: Positive for shortness of breath.   All other systems reviewed and are negative.      Observations/Objective: No SOB or distress noted, no cough noted  Assessment and Plan: Phyllis Walker comes in today with chief complaint of No chief complaint on file.   Diagnosis and  orders addressed:  1. SOB (shortness of breath)  2. Malignant neoplasm of upper-inner quadrant of right breast in female, estrogen receptor positive ([place]) Keep all Oncologists appts   3. Acute bronchitis, unspecified organism - Take meds as prescribed - Use a cool mist humidifier  -Use saline nose sprays frequently -Force fluids -For any cough or congestion  Use plain Mucinex- regular strength or max strength is fine -For fever or aces or pains- take tylenol or ibuprofen. -RTO in 2 weeks to recheck and get chest x-ray - predniSONE (STERAPRED UNI-PAK 21 TAB) 10 MG (21) TBPK tablet; Use as directed  Dispense: 21 tablet; Refill: 0 - albuterol (PROAIR HFA) 108 (90 Base) MCG/ACT inhaler; Inhale 2 puffs in lungs every 6 hours as needed for wheezing or shortness of breath.  Dispense: 8.5 g; Refill: 0   Health Maintenance reviewed Diet and exercise encouraged  Follow up plan: 2 weeks to recheck SOB and Bronchitis      I discussed the assessment and treatment plan with the patient. The patient was provided an opportunity to ask questions and all were answered. The patient agreed with the plan and demonstrated an understanding of the instructions.   The patient was advised to call back or seek an in-person evaluation if the symptoms worsen or if the condition fails to improve as anticipated.  The above assessment and management plan was discussed with the patient. The patient verbalized understanding of and has agreed to the management plan. Patient is aware to call the clinic  if symptoms persist or worsen. Patient is aware when to return to the clinic for a follow-up visit. Patient educated on when it is appropriate to go to the emergency department.    Call ended 10:29 AM, I provided 14 minutes of non-face-to-face time during this encounter.    Evelina Dun, FNP

## 2019-01-21 DIAGNOSIS — C50919 Malignant neoplasm of unspecified site of unspecified female breast: Secondary | ICD-10-CM | POA: Diagnosis not present

## 2019-01-21 DIAGNOSIS — F419 Anxiety disorder, unspecified: Secondary | ICD-10-CM | POA: Diagnosis not present

## 2019-01-21 DIAGNOSIS — G629 Polyneuropathy, unspecified: Secondary | ICD-10-CM | POA: Diagnosis not present

## 2019-01-21 DIAGNOSIS — J309 Allergic rhinitis, unspecified: Secondary | ICD-10-CM | POA: Diagnosis not present

## 2019-01-21 DIAGNOSIS — E039 Hypothyroidism, unspecified: Secondary | ICD-10-CM | POA: Diagnosis not present

## 2019-01-21 DIAGNOSIS — G8929 Other chronic pain: Secondary | ICD-10-CM | POA: Diagnosis not present

## 2019-01-21 DIAGNOSIS — I10 Essential (primary) hypertension: Secondary | ICD-10-CM | POA: Diagnosis not present

## 2019-01-21 DIAGNOSIS — R69 Illness, unspecified: Secondary | ICD-10-CM | POA: Diagnosis not present

## 2019-01-21 DIAGNOSIS — G2581 Restless legs syndrome: Secondary | ICD-10-CM | POA: Diagnosis not present

## 2019-01-21 DIAGNOSIS — G4700 Insomnia, unspecified: Secondary | ICD-10-CM | POA: Diagnosis not present

## 2019-01-24 ENCOUNTER — Ambulatory Visit: Payer: Medicare HMO | Admitting: Family

## 2019-01-24 ENCOUNTER — Other Ambulatory Visit: Payer: Self-pay

## 2019-01-25 ENCOUNTER — Encounter: Payer: Self-pay | Admitting: Family

## 2019-01-25 ENCOUNTER — Other Ambulatory Visit: Payer: Self-pay

## 2019-01-25 ENCOUNTER — Ambulatory Visit (INDEPENDENT_AMBULATORY_CARE_PROVIDER_SITE_OTHER): Payer: Medicare HMO | Admitting: Family

## 2019-01-25 ENCOUNTER — Ambulatory Visit (INDEPENDENT_AMBULATORY_CARE_PROVIDER_SITE_OTHER): Payer: Medicare HMO

## 2019-01-25 VITALS — BP 114/73 | HR 102 | Temp 97.3°F | Ht 64.0 in | Wt 149.2 lb

## 2019-01-25 DIAGNOSIS — R0602 Shortness of breath: Secondary | ICD-10-CM

## 2019-01-25 DIAGNOSIS — J189 Pneumonia, unspecified organism: Secondary | ICD-10-CM | POA: Diagnosis not present

## 2019-01-25 NOTE — Patient Instructions (Signed)
Community-Acquired Pneumonia, Adult  Pneumonia is an infection of the lungs. It causes swelling in the airways of the lungs. Mucus and fluid may also build up inside the airways.  One type of pneumonia can happen while a person is in a hospital. A different type can happen when a person is not in a hospital (community-acquired pneumonia).   What are the causes?    This condition is caused by germs (viruses, bacteria, or fungi). Some types of germs can be passed from one person to another. This can happen when you breathe in droplets from the cough or sneeze of an infected person.  What increases the risk?  You are more likely to develop this condition if you:   Have a long-term (chronic) disease, such as:  ? Chronic obstructive pulmonary disease (COPD).  ? Asthma.  ? Cystic fibrosis.  ? Congestive heart failure.  ? Diabetes.  ? Kidney disease.   Have HIV.   Have sickle cell disease.   Have had your spleen removed.   Do not take good care of your teeth and mouth (poor dental hygiene).   Have a medical condition that increases the risk of breathing in droplets from your own mouth and nose.   Have a weakened body defense system (immune system).   Are a smoker.   Travel to areas where the germs that cause this illness are common.   Are around certain animals or the places they live.  What are the signs or symptoms?   A dry cough.   A wet (productive) cough.   Fever.   Sweating.   Chest pain. This often happens when breathing deeply or coughing.   Fast breathing or trouble breathing.   Shortness of breath.   Shaking chills.   Feeling tired (fatigue).   Muscle aches.  How is this treated?  Treatment for this condition depends on many things. Most adults can be treated at home. In some cases, treatment must happen in a hospital. Treatment may include:   Medicines given by mouth or through an IV tube.   Being given extra oxygen.   Respiratory therapy.  In rare cases, treatment for very bad pneumonia  may include:   Using a machine to help you breathe.   Having a procedure to remove fluid from around your lungs.  Follow these instructions at home:  Medicines   Take over-the-counter and prescription medicines only as told by your doctor.  ? Only take cough medicine if you are losing sleep.   If you were prescribed an antibiotic medicine, take it as told by your doctor. Do not stop taking the antibiotic even if you start to feel better.  General instructions     Sleep with your head and neck raised (elevated). You can do this by sleeping in a recliner or by putting a few pillows under your head.   Rest as needed. Get at least 8 hours of sleep each night.   Drink enough water to keep your pee (urine) pale yellow.   Eat a healthy diet that includes plenty of vegetables, fruits, whole grains, low-fat dairy products, and lean protein.   Do not use any products that contain nicotine or tobacco. These include cigarettes, e-cigarettes, and chewing tobacco. If you need help quitting, ask your doctor.   Keep all follow-up visits as told by your doctor. This is important.  How is this prevented?  A shot (vaccine) can help prevent pneumonia. Shots are often suggested for:   People   older than 74 years of age.   People older than 74 years of age who:  ? Are having cancer treatment.  ? Have long-term (chronic) lung disease.  ? Have problems with their body's defense system.  You may also prevent pneumonia if you take these actions:   Get the flu (influenza) shot every year.   Go to the dentist as often as told.   Wash your hands often. If you cannot use soap and water, use hand sanitizer.  Contact a doctor if:   You have a fever.   You lose sleep because your cough medicine does not help.  Get help right away if:   You are short of breath and it gets worse.   You have more chest pain.   Your sickness gets worse. This is very serious if:  ? You are an older adult.  ? Your body's defense system is weak.   You  cough up blood.  Summary   Pneumonia is an infection of the lungs.   Most adults can be treated at home. Some will need treatment in a hospital.   Drink enough water to keep your pee pale yellow.   Get at least 8 hours of sleep each night.  This information is not intended to replace advice given to you by your health care provider. Make sure you discuss any questions you have with your health care provider.  Document Released: 02/15/2008 Document Revised: 04/26/2018 Document Reviewed: 04/26/2018  Elsevier Interactive Patient Education  2019 [employer]

## 2019-01-25 NOTE — Progress Notes (Signed)
   Subjective:    Patient ID: Phyllis Walker, female    DOB: 03-14-1946, 73 y.o.   MRN: [id number]  Chief Complaint  Patient presents with  . follow up pneumonia    HPI PT presents to the office today to recheck pneumonia. She was seen on 10/20/18 and diagnosed with CPA and started on Levaquin and prednisone.    She was seen on 01/10/19 and given prednisone and albuterol. She states she feels the best she has now then in months.   She states she has started gardening and getting out of the house.    Review of Systems  All other systems reviewed and are negative.      Objective:   Physical Exam Vitals signs reviewed.  Constitutional:      General: She is not in acute distress.    Appearance: She is well-developed.  HENT:     Head: Normocephalic and atraumatic.     Right Ear: Tympanic membrane normal.     Left Ear: Tympanic membrane normal.  Eyes:     Pupils: Pupils are equal, round, and reactive to light.  Neck:     Musculoskeletal: Normal range of motion and neck supple.     Thyroid: No thyromegaly.  Cardiovascular:     Rate and Rhythm: Normal rate and regular rhythm.     Heart sounds: Normal heart sounds. No murmur.  Pulmonary:     Effort: Pulmonary effort is normal. No respiratory distress.     Breath sounds: Normal breath sounds. No wheezing.  Abdominal:     General: Bowel sounds are normal. There is no distension.     Palpations: Abdomen is soft.     Tenderness: There is no abdominal tenderness.  Musculoskeletal: Normal range of motion.        General: No tenderness.  Skin:    General: Skin is warm and dry.  Neurological:     Mental Status: She is alert and oriented to person, place, and time.     Cranial Nerves: No cranial nerve deficit.     Deep Tendon Reflexes: Reflexes are normal and symmetric.  Psychiatric:        Behavior: Behavior normal.        Thought Content: Thought content normal.        Judgment: Judgment normal.      BP 114/73   Pulse  (!) 102   Temp (!) 97.3 F (36.3 C) (Oral)   Ht 5\' 4"  (1.626 m)   Wt 149 lb 3.2 oz (67.7 kg)   BMI 25.61 kg/m       Assessment & Plan:  Phyllis Walker comes in today with chief complaint of follow up pneumonia   Diagnosis and orders addressed:  1. Shortness of breath - DG Chest 2 View; Future  2. Community acquired pneumonia, unspecified laterality  Resolved Can use albuterol as needed for SOB Continue to social distance  Keep all follow up visit with PCP and Oncologists    Evelina Dun, FNP

## 2019-01-30 ENCOUNTER — Other Ambulatory Visit: Payer: Self-pay

## 2019-01-30 ENCOUNTER — Other Ambulatory Visit: Payer: Self-pay | Admitting: Family

## 2019-01-30 ENCOUNTER — Telehealth: Payer: Self-pay | Admitting: Adult Health

## 2019-01-30 DIAGNOSIS — J209 Acute bronchitis, unspecified: Secondary | ICD-10-CM

## 2019-01-30 NOTE — Telephone Encounter (Signed)
Called patient regarding upcoming Webex appointment, patient would prefer this to be a telephone visit.

## 2019-01-31 ENCOUNTER — Ambulatory Visit (INDEPENDENT_AMBULATORY_CARE_PROVIDER_SITE_OTHER): Payer: Medicare HMO | Admitting: *Deleted

## 2019-01-31 ENCOUNTER — Other Ambulatory Visit: Payer: Self-pay

## 2019-01-31 DIAGNOSIS — E538 Deficiency of other specified B group vitamins: Secondary | ICD-10-CM

## 2019-01-31 NOTE — Progress Notes (Signed)
Pt given Cyanocobalamin inj Tolerated well 

## 2019-02-01 ENCOUNTER — Inpatient Hospital Stay: Payer: Medicare HMO | Admitting: Adult Health

## 2019-02-05 ENCOUNTER — Encounter: Payer: Medicare HMO | Admitting: Adult Health

## 2019-03-01 ENCOUNTER — Other Ambulatory Visit: Payer: Self-pay | Admitting: Nurse Practitioner

## 2019-03-01 DIAGNOSIS — F3342 Major depressive disorder, recurrent, in full remission: Secondary | ICD-10-CM

## 2019-03-04 ENCOUNTER — Other Ambulatory Visit: Payer: Self-pay

## 2019-03-04 ENCOUNTER — Ambulatory Visit (INDEPENDENT_AMBULATORY_CARE_PROVIDER_SITE_OTHER): Payer: Medicare HMO | Admitting: *Deleted

## 2019-03-04 DIAGNOSIS — E538 Deficiency of other specified B group vitamins: Secondary | ICD-10-CM

## 2019-03-06 ENCOUNTER — Telehealth: Payer: Self-pay | Admitting: *Deleted

## 2019-03-06 DIAGNOSIS — R05 Cough: Secondary | ICD-10-CM | POA: Diagnosis not present

## 2019-03-06 DIAGNOSIS — Z170 Estrogen receptor positive status [ER+]: Secondary | ICD-10-CM | POA: Diagnosis not present

## 2019-03-06 DIAGNOSIS — J189 Pneumonia, unspecified organism: Secondary | ICD-10-CM | POA: Diagnosis not present

## 2019-03-06 DIAGNOSIS — C50211 Malignant neoplasm of upper-inner quadrant of right female breast: Secondary | ICD-10-CM | POA: Diagnosis not present

## 2019-03-06 DIAGNOSIS — I700 Atherosclerosis of aorta: Secondary | ICD-10-CM | POA: Diagnosis not present

## 2019-03-06 DIAGNOSIS — R634 Abnormal weight loss: Secondary | ICD-10-CM | POA: Diagnosis not present

## 2019-03-06 NOTE — Telephone Encounter (Signed)
Provider for patient at [facility] called to report on current health status.  Phyllis Walker has lost ten pounds since diagnosis earlier this year for pneumonia.  She is suffering with shortness of breath due to right lower lobe consolidation.  She has had x-ray , lab work and will be started on steroids.  She has been scheduled a follow up appointment next week with her provider here at [facility].

## 2019-03-13 ENCOUNTER — Other Ambulatory Visit: Payer: Self-pay

## 2019-03-14 ENCOUNTER — Ambulatory Visit: Payer: Medicare HMO | Admitting: Family

## 2019-03-14 DIAGNOSIS — Z1159 Encounter for screening for other viral diseases: Secondary | ICD-10-CM | POA: Diagnosis not present

## 2019-03-15 ENCOUNTER — Other Ambulatory Visit: Payer: Self-pay

## 2019-03-15 ENCOUNTER — Ambulatory Visit (INDEPENDENT_AMBULATORY_CARE_PROVIDER_SITE_OTHER): Payer: Medicare HMO | Admitting: Family

## 2019-03-15 ENCOUNTER — Encounter: Payer: Self-pay | Admitting: Family

## 2019-03-15 VITALS — BP 111/66 | HR 97 | Temp 97.5°F | Ht 64.0 in | Wt 148.6 lb

## 2019-03-15 DIAGNOSIS — J181 Lobar pneumonia, unspecified organism: Secondary | ICD-10-CM | POA: Diagnosis not present

## 2019-03-15 DIAGNOSIS — J189 Pneumonia, unspecified organism: Secondary | ICD-10-CM

## 2019-03-15 MED ORDER — DOXYCYCLINE HYCLATE 100 MG PO TABS: 100.0000 mg | ORAL_TABLET | Freq: Two times a day (BID) | ORAL | 0 refills | Status: DC

## 2019-03-15 NOTE — Progress Notes (Signed)
   Subjective:    Patient ID: Phyllis Walker, female    DOB: 1945-09-13, 73 y.o.   MRN: [id number]  Chief Complaint  Patient presents with  . check lungs    HPI Pt presents to the office today to recheck  Right lower lobe CAP. She was seen by her hematologists on 03/06/19 and was diagnosed. She was given prednisone and Levofloxacin 500 mg for 10 days.   She reports she is feeling better and can breathe better. She states she mowed her grass yesterday. Denies any cough, fever, or fatigue.   She states she is going dancing tonight.    Review of Systems  All other systems reviewed and are negative.      Objective:   Physical Exam Vitals signs reviewed.  Constitutional:      General: She is not in acute distress.    Appearance: She is well-developed.  HENT:     Head: Normocephalic and atraumatic.     Right Ear: External ear normal.  Eyes:     Pupils: Pupils are equal, round, and reactive to light.  Neck:     Musculoskeletal: Normal range of motion and neck supple.     Thyroid: No thyromegaly.  Cardiovascular:     Rate and Rhythm: Normal rate and regular rhythm.     Heart sounds: Normal heart sounds. No murmur.  Pulmonary:     Effort: Pulmonary effort is normal. No respiratory distress.     Breath sounds: Examination of the right-lower field reveals rales. Rales present. No wheezing.  Abdominal:     General: Bowel sounds are normal. There is no distension.     Palpations: Abdomen is soft.     Tenderness: There is no abdominal tenderness.  Musculoskeletal: Normal range of motion.        General: No tenderness.  Skin:    General: Skin is warm and dry.  Neurological:     Mental Status: She is alert and oriented to person, place, and time.     Cranial Nerves: No cranial nerve deficit.     Deep Tendon Reflexes: Reflexes are normal and symmetric.  Psychiatric:        Behavior: Behavior normal.        Thought Content: Thought content normal.        Judgment: Judgment  normal.     BP 111/66   Pulse 97   Temp (!) 97.5 F (36.4 C) (Oral)   Ht 5\' 4"  (1.626 m)   Wt 148 lb 9.6 oz (67.4 kg)   SpO2 97%   BMI 25.51 kg/m       Assessment & Plan:  Phyllis Walker comes in today with chief complaint of check lungs   Diagnosis and orders addressed:  1. Community acquired pneumonia of right lower lobe of lung ([place]) Will start doxycycline today Return to office on Thursday to have repeat x-ray and lab work Force fluids Rest RTO in 4 weeks  - CBC with Differential/Platelet; Future - DG Chest 2 View; Future - doxycycline (VIBRA-TABS) 100 MG tablet; Take 1 tablet (100 mg total) by mouth 2 (two) times daily.  Dispense: 20 tablet; Refill: 0   Evelina Dun, FNP

## 2019-03-21 ENCOUNTER — Other Ambulatory Visit: Payer: Self-pay

## 2019-03-21 ENCOUNTER — Other Ambulatory Visit: Payer: Medicare HMO

## 2019-03-21 ENCOUNTER — Ambulatory Visit (INDEPENDENT_AMBULATORY_CARE_PROVIDER_SITE_OTHER): Payer: Medicare HMO

## 2019-03-21 DIAGNOSIS — J188 Other pneumonia, unspecified organism: Secondary | ICD-10-CM | POA: Diagnosis not present

## 2019-03-21 DIAGNOSIS — J189 Pneumonia, unspecified organism: Secondary | ICD-10-CM

## 2019-03-21 DIAGNOSIS — J181 Lobar pneumonia, unspecified organism: Secondary | ICD-10-CM

## 2019-03-22 LAB — CBC WITH DIFFERENTIAL/PLATELET
Basophils Absolute: 0.1 10*3/uL (ref 0.0–0.2)
Basos: 1 %
EOS (ABSOLUTE): 0.3 10*3/uL (ref 0.0–0.4)
Eos: 4 %
Hematocrit: 36.9 % (ref 34.0–46.6)
Hemoglobin: 12.2 g/dL (ref 11.1–15.9)
Immature Grans (Abs): 0 10*3/uL (ref 0.0–0.1)
Immature Granulocytes: 0 %
Lymphocytes Absolute: 1.7 10*3/uL (ref 0.7–3.1)
Lymphs: 19 %
MCH: 27.6 pg (ref 26.6–33.0)
MCHC: 33.1 g/dL (ref 31.5–35.7)
MCV: 84 fL (ref 79–97)
Monocytes Absolute: 0.7 10*3/uL (ref 0.1–0.9)
Monocytes: 8 %
Neutrophils Absolute: 6.1 10*3/uL (ref 1.4–7.0)
Neutrophils: 68 %
Platelets: 260 10*3/uL (ref 150–450)
RBC: 4.42 x10E6/uL (ref 3.77–5.28)
RDW: 14 % (ref 11.7–15.4)
WBC: 9 10*3/uL (ref 3.4–10.8)

## 2019-03-26 ENCOUNTER — Ambulatory Visit (INDEPENDENT_AMBULATORY_CARE_PROVIDER_SITE_OTHER): Payer: Medicare HMO | Admitting: *Deleted

## 2019-03-26 ENCOUNTER — Encounter: Payer: Self-pay | Admitting: *Deleted

## 2019-03-26 DIAGNOSIS — Z0000 Encounter for general adult medical examination without abnormal findings: Secondary | ICD-10-CM

## 2019-03-26 NOTE — Patient Instructions (Signed)
Preventive Care 74 Years and Older, Female Preventive care refers to lifestyle choices and visits with your health care provider that can promote health and wellness. This includes:  A yearly physical exam. This is also called an annual well check.  Regular dental and eye exams.  Immunizations.  Screening for certain conditions.  Healthy lifestyle choices, such as diet and exercise. What can I expect for my preventive care visit? Physical exam Your health care provider will check:  Height and weight. These may be used to calculate body mass index (BMI), which is a measurement that tells if you are at a healthy weight.  Heart rate and blood pressure.  Your skin for abnormal spots. Counseling Your health care provider may ask you questions about:  Alcohol, tobacco, and drug use.  Emotional well-being.  Home and relationship well-being.  Sexual activity.  Eating habits.  History of falls.  Memory and ability to understand (cognition).  Work and work Statistician.  Pregnancy and menstrual history. What immunizations do I need?  Influenza (flu) vaccine  This is recommended every year. Tetanus, diphtheria, and pertussis (Tdap) vaccine  You may need a Td booster every 10 years. Varicella (chickenpox) vaccine  You may need this vaccine if you have not already been vaccinated. Zoster (shingles) vaccine  You may need this after age 33. Pneumococcal conjugate (PCV13) vaccine  One dose is recommended after age 33. Pneumococcal polysaccharide (PPSV23) vaccine  One dose is recommended after age 72. Measles, mumps, and rubella (MMR) vaccine  You may need at least one dose of MMR if you were born in 1957 or later. You may also need a second dose. Meningococcal conjugate (MenACWY) vaccine  You may need this if you have certain conditions. Hepatitis A vaccine  You may need this if you have certain conditions or if you travel or work in places where you may be exposed  to hepatitis A. Hepatitis B vaccine  You may need this if you have certain conditions or if you travel or work in places where you may be exposed to hepatitis B. Haemophilus influenzae type b (Hib) vaccine  You may need this if you have certain conditions. You may receive vaccines as individual doses or as more than one vaccine together in one shot (combination vaccines). Talk with your health care provider about the risks and benefits of combination vaccines. What tests do I need? Blood tests  Lipid and cholesterol levels. These may be checked every 5 years, or more frequently depending on your overall health.  Hepatitis C test.  Hepatitis B test. Screening  Lung cancer screening. You may have this screening every year starting at age 39 if you have a 30-pack-year history of smoking and currently smoke or have quit within the past 15 years.  Colorectal cancer screening. All adults should have this screening starting at age 36 and continuing until age 15. Your health care provider may recommend screening at age 23 if you are at increased risk. You will have tests every 1-10 years, depending on your results and the type of screening test.  Diabetes screening. This is done by checking your blood sugar (glucose) after you have not eaten for a while (fasting). You may have this done every 1-3 years.  Mammogram. This may be done every 1-2 years. Talk with your health care provider about how often you should have regular mammograms.  BRCA-related cancer screening. This may be done if you have a family history of breast, ovarian, tubal, or peritoneal cancers.  Other tests  Sexually transmitted disease (STD) testing.  Bone density scan. This is done to screen for osteoporosis. You may have this done starting at age 76. Follow these instructions at home: Eating and drinking  Eat a diet that includes fresh fruits and vegetables, whole grains, lean protein, and low-fat dairy products. Limit  your intake of foods with high amounts of sugar, saturated fats, and salt.  Take vitamin and mineral supplements as recommended by your health care provider.  Do not drink alcohol if your health care provider tells you not to drink.  If you drink alcohol: ? Limit how much you have to 0-1 drink a day. ? Be aware of how much alcohol is in your drink. In the [place] one drink equals one 12 oz bottle of beer (355 mL), one 5 oz glass of wine (148 mL), or one 1 oz glass of hard liquor (44 mL). Lifestyle  Take daily care of your teeth and gums.  Stay active. Exercise for at least 30 minutes on 5 or more days each week.  Do not use any products that contain nicotine or tobacco, such as cigarettes, e-cigarettes, and chewing tobacco. If you need help quitting, ask your health care provider.  If you are sexually active, practice safe sex. Use a condom or other form of protection in order to prevent STIs (sexually transmitted infections).  Talk with your health care provider about taking a low-dose aspirin or statin. What's next?  Go to your health care provider once a year for a well check visit.  Ask your health care provider how often you should have your eyes and teeth checked.  Stay up to date on all vaccines. This information is not intended to replace advice given to you by your health care provider. Make sure you discuss any questions you have with your health care provider. Document Released: 09/25/2015 Document Revised: 08/23/2018 Document Reviewed: 08/23/2018 Elsevier Patient Education  2020 [employer].

## 2019-03-26 NOTE — Progress Notes (Addendum)
MEDICARE ANNUAL WELLNESS VISIT  03/26/2019  Telephone Visit Disclaimer This Medicare AWV was conducted by telephone due to national recommendations for restrictions regarding the COVID-19 Pandemic (e.g. social distancing).  I verified, using two identifiers, that I am speaking with Phyllis Walker or their authorized healthcare agent. I discussed the limitations, risks, security, and privacy concerns of performing an evaluation and management service by telephone and the potential availability of an in-person appointment in the future. The patient expressed understanding and agreed to proceed.   Subjective:  Phyllis Walker is a 73 y.o. female patient of Phyllis Walker, [place] who had a Medicare Annual Wellness Visit today via telephone. Phyllis Walker is Retired and lives alone with her cat. she has 2 children. she reports that she is socially active and does interact with friends/family regularly. she is minimally physically active and enjoys watching TV.  Patient Care Team: Phyllis Pretty, FNP as PCP - General (Nurse Practitioner) Vickey Huger, MD as Consulting Physician (Orthopedic Surgery) Huel Cote, NP as Nurse Practitioner (Obstetrics and Gynecology) Nicholas Lose, MD as Consulting Physician (Hematology and Oncology) Alphonsa Overall, MD as Consulting Physician (General Surgery) Kyung Rudd, MD as Consulting Physician (Radiation Oncology)  Advanced Directives 03/26/2019 07/05/2018 07/02/2018 06/12/2018 01/02/2018 12/29/2016 08/08/2016  Does Patient Have a Medical Advance Directive? No No No Yes Yes No No  Type of Advance Directive - - Public librarian Living will - -  [place] in Chart? - - - No - copy requested - - -  Would patient like information on creating a medical advance directive? No - Patient declined No - Patient declined No - Patient declined No - Patient declined - Yes (MAU/Ambulatory/Procedural Areas - Information given) No -  Patient declined    Hospital Utilization Over the Past 12 Months: # of hospitalizations or ER visits: 0 # of surgeries: 1  Review of Systems    Patient reports that her overall health is worse compared to last year.  Patient Reported Readings (BP, Pulse, CBG, Weight, etc) none  Review of Systems: No complaints  All other systems negative.  Pain Assessment Pain : No/denies pain     Current Medications & Allergies (verified) Allergies as of 03/26/2019      Reactions   Amoxicillin-pot Clavulanate Nausea And Vomiting, Other (See Comments)   STOMACH CRAMPS Has patient had a PCN reaction causing immediate rash, facial/tongue/throat swelling, SOB or lightheadedness with hypotension: No Has patient had a PCN reaction causing severe rash involving mucus membranes or skin necrosis: No Has patient had a PCN reaction that required hospitalization No Has patient had a PCN reaction occurring within the last 10 years: Yes If all of the above answers are "NO", then may proceed with Cephalosporin use.      Medication List       Accurate as of March 26, 2019 10:00 AM. If you have any questions, ask your nurse or doctor.        STOP taking these medications   doxycycline 100 MG tablet Commonly known as: VIBRA-TABS Stopped by: WYATT, AMY M, RN   EQL Flaxseed Oil 1200 MG Caps Stopped by: WYATT, AMY M, RN   letrozole 2.5 MG tablet Commonly known as: [place] by: WYATT, AMY M, RN     TAKE these medications   albuterol 108 (90 Base) MCG/ACT inhaler Commonly known as: VENTOLIN HFA 2 PUFFS EVERY 6 HOURS AS NEEDED FOR WHEEZING OR SHORTNESS OF BREATH  ALPRAZolam 0.25 MG tablet Commonly known as: XANAX Take 1 tablet (0.25 mg total) by mouth 2 (two) times daily as needed for anxiety.   benazepril-hydrochlorthiazide 20-12.5 MG tablet Commonly known as: LOTENSIN HCT Take 0.5 tablets by mouth daily.   Fish Oil 1200 MG Caps Take 1,200 mg by mouth daily.   gabapentin 100 MG  capsule Commonly known as: NEURONTIN Take 1 capsule (100 mg total) by mouth 3 (three) times daily.   levothyroxine 75 MCG tablet Commonly known as: SYNTHROID Take 1 tablet (75 mcg total) by mouth daily.   rOPINIRole 0.25 MG tablet Commonly known as: REQUIP Take 1 tablet (0.25 mg total) by mouth 3 (three) times daily.   sertraline 50 MG tablet Commonly known as: ZOLOFT TAKE 1 TABLET DAILY   zolpidem 10 MG tablet Commonly known as: AMBIEN Take 1 tablet (10 mg total) by mouth at bedtime as needed. for sleep       History (reviewed): Past Medical History:  Diagnosis Date  . Anemia   . Anxiety   . Arthritis   . Breast cancer, right breast ([place]) 06/2018  . Cataract   . Depression   . Fibroid tumor 1985   in breast  . History of hiatal hernia   . Hypercholesteremia   . Hypertension   . Hypothyroidism   . Neuropathy    in feet  . Restless leg syndrome   . 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 BREAST LUMPECTOMY  1987   benign fibroid tumor  . BREAST LUMPECTOMY WITH RADIOACTIVE SEED AND SENTINEL LYMPH NODE BIOPSY Right 07/05/2018   Procedure: RIGHT BREAST LUMPECTOMY WITH RADIOACTIVE SEED X 2 AND RIGHT AXILLARY SENTINEL LYMPH NODE BIOPSY;  Surgeon: Alphonsa Overall, MD;  Location: [facility];  Service: General;  Laterality: Right;  . BUNIONECTOMY Bilateral   . CATARACT EXTRACTION    . COLONOSCOPY    . THYROIDECTOMY, PARTIAL  1969  . TONSILLECTOMY    . TOTAL KNEE ARTHROPLASTY Right 08/15/2016   Procedure: TOTAL KNEE ARTHROPLASTY;  Surgeon: Vickey Huger, MD;  Location: [facility];  Service: Orthopedics;  Laterality: Right;  RNFA   Family History  Problem Relation Age of Onset  . Cancer Mother        pancreas  . Heart attack Father   . Hypertension Sister   . Hyperlipidemia Sister   . Anuerysm Sister   . Hypertension Sister   . Hyperlipidemia Sister   . Early death Sister 2       accident  .  Ovarian cancer Maternal Aunt    Social History   Socioeconomic History  . Marital status: Divorced    Spouse name: Not on file  . Number of children: 2  . Years of education: 44  . Highest education level: High school graduate  Occupational History  . Occupation: retired  Scientific laboratory technician  . Financial resource strain: Not hard at all  . Food insecurity    Worry: Never true    Inability: Never true  . Transportation needs    Medical: No    Non-medical: No  Tobacco Use  . Smoking status: Former Smoker    Types: Cigarettes    Quit date: 12/21/2006    Years since quitting: 12.2  . Smokeless tobacco: Never Used  Substance and Sexual Activity  . Alcohol use: No  . Drug use: No  . Sexual activity: Not Currently  Lifestyle  . Physical activity  Days per week: 0 days    Minutes per session: 0 min  . Stress: Not at all  Relationships  . Social connections    Talks on phone: More than three times a week    Gets together: More than three times a week    Attends religious service: More than 4 times per year    Active member of club or organization: No    Attends meetings of clubs or organizations: Never    Relationship status: Divorced  Other Topics Concern  . Not on file  Social History Narrative   Lives alone has one cat    Activities of Daily Living In your present state of health, do you have any difficulty performing the following activities: 03/26/2019 07/05/2018  Hearing? N N  Vision? N N  Difficulty concentrating or making decisions? N N  Walking or climbing stairs? N N  Dressing or bathing? N N  Doing errands, shopping? N -  Preparing Food and eating ? N -  Using the Toilet? N -  In the past six months, have you accidently leaked urine? Y -  Comment only when she coughs -  Do you have problems with loss of bowel control? N -  Managing your Medications? N -  Managing your Finances? N -  Housekeeping or managing your Housekeeping? N -  Some recent data might be  hidden    Patient Literacy How often do you need to have someone help you when you read instructions, pamphlets, or other written materials from your doctor or pharmacy?: 1 - Never What is the last grade level you completed in school?: 12th grade  Exercise Current Exercise Habits: The patient does not participate in regular exercise at present, Exercise limited by: None identified  Diet Patient reports consuming 2 meals a day and 2 snack(s) a day Patient reports that her primary diet is: Regular Patient reports that she does have regular access to food.   Depression Screen PHQ 2/9 Scores 03/26/2019 03/15/2019 03/15/2019 01/25/2019 10/20/2018 10/04/2018 04/02/2018  PHQ - 2 Score 1 0 0 0 0 0 0  PHQ- 9 Score - 0 - - - - -     Fall Risk Fall Risk  03/26/2019 03/15/2019 01/25/2019 10/04/2018 04/02/2018  Falls in the past year? 0 0 0 0 No     Objective:  Phyllis Walker seemed alert and oriented and she participated appropriately during our telephone visit.  Blood Pressure Weight BMI  BP Readings from Last 3 Encounters:  03/15/19 111/66  01/25/19 114/73  10/20/18 123/66   Wt Readings from Last 3 Encounters:  03/15/19 148 lb 9.6 oz (67.4 kg)  01/25/19 149 lb 3.2 oz (67.7 kg)  10/20/18 162 lb (73.5 kg)   BMI Readings from Last 1 Encounters:  03/15/19 25.51 kg/m    *Unable to obtain current vital signs, weight, and BMI due to telephone visit type  Hearing/Vision  . Bayli did not seem to have difficulty with hearing/understanding during the telephone conversation . Reports that she has not had a formal eye exam by an eye care professional within the past year . Reports that she has not had a formal hearing evaluation within the past year *Unable to fully assess hearing and vision during telephone visit type  Cognitive Function: 6CIT Screen 03/26/2019  What Year? 0 points  What month? 0 points  What time? 0 points  Count back from 20 0 points  Months in reverse 0 points  Repeat phrase 0  points  Total Score 0   (Normal:0-7, Significant for Dysfunction: >8)  Normal Cognitive Function Screening: Yes   Immunization & Health Maintenance Record Immunization History  Administered Date(s) Administered  . Influenza Split 06/18/2013  . Influenza,inj,Quad PF,6+ Mos 06/09/2014, 06/19/2015, 06/10/2017, 07/12/2018  . Influenza-Unspecified 06/20/2016  . Pneumococcal Conjugate-13 11/20/2014  . Pneumococcal Polysaccharide-23 09/12/2010  . Tdap 12/29/2016  . Zoster 05/24/2013    Health Maintenance  Topic Date Due  . INFLUENZA VACCINE  04/13/2019  . MAMMOGRAM  04/17/2020  . COLONOSCOPY  05/17/2022  . TETANUS/TDAP  12/30/2026  . DEXA SCAN  Completed  . Hepatitis C Screening  Completed  . PNA vac Low Risk Adult  Completed       Assessment  This is a routine wellness examination for ANITHA KREISER.  Health Maintenance: Due or Overdue There are no preventive care reminders to display for this patient.  Phyllis Walker does not need a referral for Community Assistance: Care Management:   no Social Work:    no Prescription Assistance:  no Nutrition/Diabetes Education:  no   Plan:  Personalized Goals Goals Addressed            This Visit's Progress   . DIET - INCREASE WATER INTAKE       Try to drink 6-8 glasses of water daily.      Personalized Health Maintenance & Screening Recommendations  Advanced directives: has NO advanced directive - not interested in additional information  Lung Cancer Screening Recommended: no (Low Dose CT Chest recommended if Age 38-80 years, 30 pack-year currently smoking OR have quit w/in past 15 years) Hepatitis C Screening recommended: no HIV Screening recommended: no  Advanced Directives: Written information was not prepared per patient's request.  Referrals & Orders No orders of the defined types were placed in this encounter.   Follow-up Plan . Follow-up with Phyllis Pretty, FNP as planned   I have personally  reviewed and noted the following in the patient's chart:   . Medical and social history . Use of alcohol, tobacco or illicit drugs  . Current medications and supplements . Functional ability and status . Nutritional status . Physical activity . Advanced directives . List of other physicians . Hospitalizations, surgeries, and ER visits in previous 12 months . Vitals . Screenings to include cognitive, depression, and falls . Referrals and appointments  In addition, I have reviewed and discussed with Phyllis Walker certain preventive protocols, quality metrics, and best practice recommendations. A written personalized care plan for preventive services as well as general preventive health recommendations is available and can be mailed to the patient at her request.      Marylin Crosby, LPN  2/35/3614  I have reviewed and agree with the above AWV documentation.   Mary-Margaret Hassell Done, FNP

## 2019-04-03 ENCOUNTER — Other Ambulatory Visit: Payer: Self-pay

## 2019-04-04 ENCOUNTER — Encounter: Payer: Self-pay | Admitting: Nurse Practitioner

## 2019-04-04 ENCOUNTER — Ambulatory Visit (INDEPENDENT_AMBULATORY_CARE_PROVIDER_SITE_OTHER): Payer: Medicare HMO | Admitting: Nurse Practitioner

## 2019-04-04 VITALS — BP 103/60 | HR 103 | Temp 96.9°F | Ht 64.0 in | Wt 144.0 lb

## 2019-04-04 DIAGNOSIS — Z170 Estrogen receptor positive status [ER+]: Secondary | ICD-10-CM

## 2019-04-04 DIAGNOSIS — G629 Polyneuropathy, unspecified: Secondary | ICD-10-CM

## 2019-04-04 DIAGNOSIS — G2581 Restless legs syndrome: Secondary | ICD-10-CM

## 2019-04-04 DIAGNOSIS — E538 Deficiency of other specified B group vitamins: Secondary | ICD-10-CM

## 2019-04-04 DIAGNOSIS — M80079D Age-related osteoporosis with current pathological fracture, unspecified ankle and foot, subsequent encounter for fracture with routine healing: Secondary | ICD-10-CM

## 2019-04-04 DIAGNOSIS — C50211 Malignant neoplasm of upper-inner quadrant of right female breast: Secondary | ICD-10-CM | POA: Diagnosis not present

## 2019-04-04 DIAGNOSIS — D649 Anemia, unspecified: Secondary | ICD-10-CM | POA: Diagnosis not present

## 2019-04-04 DIAGNOSIS — F411 Generalized anxiety disorder: Secondary | ICD-10-CM

## 2019-04-04 DIAGNOSIS — R69 Illness, unspecified: Secondary | ICD-10-CM | POA: Diagnosis not present

## 2019-04-04 DIAGNOSIS — Z6824 Body mass index (BMI) 24.0-24.9, adult: Secondary | ICD-10-CM

## 2019-04-04 DIAGNOSIS — E785 Hyperlipidemia, unspecified: Secondary | ICD-10-CM | POA: Diagnosis not present

## 2019-04-04 DIAGNOSIS — E034 Atrophy of thyroid (acquired): Secondary | ICD-10-CM | POA: Diagnosis not present

## 2019-04-04 DIAGNOSIS — F5101 Primary insomnia: Secondary | ICD-10-CM

## 2019-04-04 DIAGNOSIS — I10 Essential (primary) hypertension: Secondary | ICD-10-CM

## 2019-04-04 DIAGNOSIS — F3342 Major depressive disorder, recurrent, in full remission: Secondary | ICD-10-CM

## 2019-04-04 MED ORDER — ZOLPIDEM TARTRATE 10 MG PO TABS: 10.0000 mg | ORAL_TABLET | Freq: Every evening | ORAL | 5 refills | Status: DC | PRN

## 2019-04-04 MED ORDER — SERTRALINE HCL 50 MG PO TABS: 50.0000 mg | ORAL_TABLET | Freq: Every day | ORAL | 1 refills | Status: DC

## 2019-04-04 MED ORDER — GABAPENTIN 100 MG PO CAPS: 100.0000 mg | ORAL_CAPSULE | Freq: Three times a day (TID) | ORAL | 1 refills | Status: DC

## 2019-04-04 MED ORDER — ALPRAZOLAM 0.25 MG PO TABS: 0.2500 mg | ORAL_TABLET | Freq: Two times a day (BID) | ORAL | 5 refills | Status: DC | PRN

## 2019-04-04 MED ORDER — LEVOTHYROXINE SODIUM 75 MCG PO TABS: 75.0000 ug | ORAL_TABLET | Freq: Every day | ORAL | 1 refills | Status: DC

## 2019-04-04 MED ORDER — BENAZEPRIL-HYDROCHLOROTHIAZIDE 20-12.5 MG PO TABS: 0.5000 | ORAL_TABLET | Freq: Every day | ORAL | 1 refills | Status: DC

## 2019-04-04 MED ORDER — ROPINIROLE HCL 0.25 MG PO TABS: 0.2500 mg | ORAL_TABLET | Freq: Three times a day (TID) | ORAL | 1 refills | Status: DC

## 2019-04-04 NOTE — Progress Notes (Signed)
Subjective:    Patient ID: Phyllis Walker, female    DOB: 12/15/1945, 73 y.o.   MRN: [id number]   Chief Complaint: Medical Management of Chronic Issues    HPI:  1. Hypertension, benign essential, goal below 140/90 No c/o chest pain, sob or headaches. Does not check blood pressure at home. BP Readings from Last 3 Encounters:  04/04/19 103/60  03/15/19 111/66  01/25/19 114/73     2. Hypothyroidism due to acquired atrophy of thyroid No problems that aware of.  3. Osteoporosis with pathological fracture of ankle and foot, unspecified laterality, with routine healing, subsequent encounter Last dexascan was done in 2015 and is due to repeat.  4. Hyperlipidemia with target LDL less than 100 Low fat diet  5. Recurrent major depressive disorder, in full remission (Phyllis Walker) **is on zoloft daily and is doing okay Depression screen [facility] 2/9 04/04/2019 03/26/2019 03/15/2019  Decreased Interest 0 0 0  Down, Depressed, Hopeless 0 1 0  PHQ - 2 Score 0 1 0  Altered sleeping - - 0  Tired, decreased energy - - 0  Change in appetite - - 0  Feeling bad or failure about yourself  - - 0  Trouble concentrating - - 0  Moving slowly or fidgety/restless - - 0  Suicidal thoughts - - 0  PHQ-9 Score - - 0  Difficult doing work/chores - - Not difficult at all    6. GAD (generalized anxiety disorder) Has been stressed since her breast cancer. The radiation has done a lot to her and she says that drives her crazy. Is on low does xanax BID  7. Restless leg syndrome She is on requip and it helps keep her legs still at night.  8. Primary insomnia Is on ambien nightly to sleep and says she rests well with it.  9. Anemia, unspecified type Last hgb was 12.2  10. Malignant neoplasm of upper-inner quadrant of right breast in female, estrogen receptor positive (Phyllis Walker) She says that radiation has destroyed her taste buds. She says she cannot eat becauase she cannot taste it. She has lost over 40 lbs.  11. BMI  31.0-31.9,adult Has lso over 40lbs.     Outpatient Encounter Medications as of 04/04/2019  Medication Sig  . albuterol (VENTOLIN HFA) 108 (90 Base) MCG/ACT inhaler 2 PUFFS EVERY 6 HOURS AS NEEDED FOR WHEEZING OR SHORTNESS OF BREATH  . ALPRAZolam (XANAX) 0.25 MG tablet Take 1 tablet (0.25 mg total) by mouth 2 (two) times daily as needed for anxiety.  . benazepril-hydrochlorthiazide (LOTENSIN HCT) 20-12.5 MG tablet Take 0.5 tablets by mouth daily.  [name] gabapentin (NEURONTIN) 100 MG capsule Take 1 capsule (100 mg total) by mouth 3 (three) times daily.  [name] levothyroxine (SYNTHROID, LEVOTHROID) 75 MCG tablet Take 1 tablet (75 mcg total) by mouth daily.  . Omega-3 Fatty Acids (FISH OIL) 1200 MG CAPS Take 1,200 mg by mouth daily.  [name] rOPINIRole (REQUIP) 0.25 MG tablet Take 1 tablet (0.25 mg total) by mouth 3 (three) times daily.  . sertraline (ZOLOFT) 50 MG tablet TAKE 1 TABLET DAILY  . zolpidem (AMBIEN) 10 MG tablet Take 1 tablet (10 mg total) by mouth at bedtime as needed. for sleep     Past Surgical History:  Procedure Laterality Date  . BREAST LUMPECTOMY  1987   benign fibroid tumor  . BREAST LUMPECTOMY WITH RADIOACTIVE SEED AND SENTINEL LYMPH NODE BIOPSY Right 07/05/2018   Procedure: RIGHT BREAST LUMPECTOMY WITH RADIOACTIVE SEED X 2 AND RIGHT AXILLARY SENTINEL LYMPH  NODE BIOPSY;  Surgeon: Alphonsa Overall, MD;  Location: [place];  Service: General;  Laterality: Right;  . BUNIONECTOMY Bilateral   . CATARACT EXTRACTION    . COLONOSCOPY    . THYROIDECTOMY, PARTIAL  1969  . TONSILLECTOMY    . TOTAL KNEE ARTHROPLASTY Right 08/15/2016   Procedure: TOTAL KNEE ARTHROPLASTY;  Surgeon: Vickey Huger, MD;  Location: [place];  Service: Orthopedics;  Laterality: Right;  RNFA    Family History  Problem Relation Age of Onset  . Cancer Mother        pancreas  . Heart attack Father   . Hypertension Sister   . Hyperlipidemia Sister   . Anuerysm Sister   . Hypertension Sister   .  Hyperlipidemia Sister   . Early death Sister 53       accident  . Ovarian cancer Maternal Aunt     New complaints: none  Social history: Lives by herself  Controlled substance contract: 04/04/19    Review of Systems  Constitutional: Negative for activity change and appetite change.  HENT: Negative.   Eyes: Negative for pain.  Respiratory: Negative for shortness of breath.   Cardiovascular: Negative for chest pain, palpitations and leg swelling.  Gastrointestinal: Negative for abdominal pain.  Endocrine: Negative for polydipsia.  Genitourinary: Negative.   Skin: Negative for rash.  Neurological: Negative for dizziness, weakness and headaches.  Hematological: Does not bruise/bleed easily.  Psychiatric/Behavioral: Negative.   All other systems reviewed and are negative.      Objective:   Physical Exam Vitals signs and nursing note reviewed.  Constitutional:      General: She is not in acute distress.    Appearance: Normal appearance. She is well-developed.  HENT:     Head: Normocephalic.     Nose: Nose normal.  Eyes:     Pupils: Pupils are equal, round, and reactive to light.  Neck:     Musculoskeletal: Normal range of motion and neck supple.     Vascular: No carotid bruit or JVD.  Cardiovascular:     Rate and Rhythm: Normal rate and regular rhythm.     Heart sounds: Normal heart sounds.  Pulmonary:     Effort: Pulmonary effort is normal. No respiratory distress.     Breath sounds: Normal breath sounds. No wheezing or rales.  Chest:     Chest wall: No tenderness.  Abdominal:     General: Bowel sounds are normal. There is no distension or abdominal bruit.     Palpations: Abdomen is soft. There is no hepatomegaly, splenomegaly, mass or pulsatile mass.     Tenderness: There is no abdominal tenderness.  Musculoskeletal: Normal range of motion.  Lymphadenopathy:     Cervical: No cervical adenopathy.  Skin:    General: Skin is warm and dry.  Neurological:      Mental Status: She is alert and oriented to person, place, and time.     Deep Tendon Reflexes: Reflexes are normal and symmetric.  Psychiatric:        Behavior: Behavior normal.        Thought Content: Thought content normal.        Judgment: Judgment normal.    BP 103/60   Pulse (!) 103   Temp (!) 96.9 F (36.1 C) (Oral)   Ht _0  (1.626 m)   Wt 144 lb (65.3 kg)   BMI 24.72 kg/m       Assessment & Plan:  Phyllis Walker comes in today  with chief complaint of Medical Management of Chronic Issues   Diagnosis and orders addressed:  1. Hypertension, benign essential, goal below 140/90 Low sodium diet - CMP14+EGFR - benazepril-hydrochlorthiazide (LOTENSIN HCT) 20-12.5 MG tablet; Take 0.5 tablets by mouth daily.  Dispense: 90 tablet; Refill: 1  2. Hypothyroidism due to acquired atrophy of thyroid - Thyroid Panel With TSH - levothyroxine (SYNTHROID) 75 MCG tablet; Take 1 tablet (75 mcg total) by mouth daily.  Dispense: 90 tablet; Refill: 1  3. Osteoporosis with pathological fracture of ankle and foot, unspecified laterality, with routine healing, subsequent encounter Weight bearing exercise. Will schedule for dexascan  4. Hyperlipidemia with target LDL less than 100 Low fat diet - Lipid panel  5. Recurrent major depressive disorder, in full remission ([place]) Stress management - sertraline (ZOLOFT) 50 MG tablet; Take 1 tablet (50 mg total) by mouth daily.  Dispense: 90 tablet; Refill: 1  6. GAD (generalized anxiety disorder) - ALPRAZolam (XANAX) 0.25 MG tablet; Take 1 tablet (0.25 mg total) by mouth 2 (two) times daily as needed for anxiety.  Dispense: 60 tablet; Refill: 5  7. Restless leg syndrome Keep legs warm at night - rOPINIRole (REQUIP) 0.25 MG tablet; Take 1 tablet (0.25 mg total) by mouth 3 (three) times daily.  Dispense: 270 tablet; Refill: 1  8. Primary insomnia Bedtime routine - zolpidem (AMBIEN) 10 MG tablet; Take 1 tablet (10 mg total) by mouth at bedtime as  needed. for sleep  Dispense: 30 tablet; Refill: 5  9. Anemia, unspecified type - CBC with Differential/Platelet  10. Malignant neoplasm of upper-inner quadrant of right breast in female, estrogen receptor positive ([place]) Keep follow up with oncology  11. BMI 24.0-24.9, adult Discussed diet and exercise for person with BMI >25 Will recheck weight in 3-6 months  12. Neuropathy - gabapentin (NEURONTIN) 100 MG capsule; Take 1 capsule (100 mg total) by mouth 3 (three) times daily.  Dispense: 270 capsule; Refill: 1   Labs pending Health Maintenance reviewed Diet and exercise encouraged  Follow up plan: 6 months   Mary-Margaret Hassell Done, FNP

## 2019-04-04 NOTE — Patient Instructions (Signed)

## 2019-04-05 ENCOUNTER — Ambulatory Visit: Payer: Medicare HMO | Admitting: Nurse Practitioner

## 2019-04-05 LAB — CMP14+EGFR
ALT: 6 IU/L (ref 0–32)
AST: 13 IU/L (ref 0–40)
Albumin/Globulin Ratio: 1.5 (ref 1.2–2.2)
Albumin: 3.5 g/dL — ABNORMAL LOW (ref 3.7–4.7)
Alkaline Phosphatase: 107 IU/L (ref 39–117)
BUN/Creatinine Ratio: 16 (ref 12–28)
BUN: 10 mg/dL (ref 8–27)
Bilirubin Total: 0.3 mg/dL (ref 0.0–1.2)
CO2: 25 mmol/L (ref 20–29)
Calcium: 8.9 mg/dL (ref 8.7–10.3)
Chloride: 97 mmol/L (ref 96–106)
Creatinine, Ser: 0.63 mg/dL (ref 0.57–1.00)
GFR calc Af Amer: 103 mL/min/{1.73_m2} (ref >59)
GFR calc non Af Amer: 89 mL/min/{1.73_m2} (ref >59)
Globulin, Total: 2.4 g/dL (ref 1.5–4.5)
Glucose: 115 mg/dL — ABNORMAL HIGH (ref 65–99)
Potassium: 3.7 mmol/L (ref 3.5–5.2)
Sodium: 137 mmol/L (ref 134–144)
Total Protein: 5.9 g/dL — ABNORMAL LOW (ref 6.0–8.5)

## 2019-04-05 LAB — CBC WITH DIFFERENTIAL/PLATELET
Basophils Absolute: 0.1 10*3/uL (ref 0.0–0.2)
Basos: 1 %
EOS (ABSOLUTE): 0.5 10*3/uL — ABNORMAL HIGH (ref 0.0–0.4)
Eos: 6 %
Hematocrit: 35.3 % (ref 34.0–46.6)
Hemoglobin: 11.2 g/dL (ref 11.1–15.9)
Immature Grans (Abs): 0 10*3/uL (ref 0.0–0.1)
Immature Granulocytes: 0 %
Lymphocytes Absolute: 1.4 10*3/uL (ref 0.7–3.1)
Lymphs: 16 %
MCH: 26.2 pg — ABNORMAL LOW (ref 26.6–33.0)
MCHC: 31.7 g/dL (ref 31.5–35.7)
MCV: 83 fL (ref 79–97)
Monocytes Absolute: 0.7 10*3/uL (ref 0.1–0.9)
Monocytes: 9 %
Neutrophils Absolute: 5.7 10*3/uL (ref 1.4–7.0)
Neutrophils: 68 %
Platelets: 390 10*3/uL (ref 150–450)
RBC: 4.28 x10E6/uL (ref 3.77–5.28)
RDW: 13.8 % (ref 11.7–15.4)
WBC: 8.3 10*3/uL (ref 3.4–10.8)

## 2019-04-05 LAB — LIPID PANEL
Chol/HDL Ratio: 3.7 ratio (ref 0.0–4.4)
Cholesterol, Total: 181 mg/dL (ref 100–199)
HDL: 49 mg/dL (ref >39)
LDL Calculated: 113 mg/dL — ABNORMAL HIGH (ref 0–99)
Triglycerides: 97 mg/dL (ref 0–149)
VLDL Cholesterol Cal: 19 mg/dL (ref 5–40)

## 2019-04-05 LAB — THYROID PANEL WITH TSH
Free Thyroxine Index: 2.5 (ref 1.2–4.9)
T3 Uptake Ratio: 26 % (ref 24–39)
T4, Total: 9.5 ug/dL (ref 4.5–12.0)
TSH: 0.576 u[IU]/mL (ref 0.450–4.500)

## 2019-04-11 ENCOUNTER — Ambulatory Visit: Payer: Medicare HMO | Admitting: Family

## 2019-04-17 ENCOUNTER — Other Ambulatory Visit: Payer: Self-pay

## 2019-04-18 ENCOUNTER — Ambulatory Visit (INDEPENDENT_AMBULATORY_CARE_PROVIDER_SITE_OTHER): Payer: Medicare HMO | Admitting: Family

## 2019-04-18 ENCOUNTER — Ambulatory Visit (INDEPENDENT_AMBULATORY_CARE_PROVIDER_SITE_OTHER): Payer: Medicare HMO

## 2019-04-18 ENCOUNTER — Encounter: Payer: Self-pay | Admitting: Family

## 2019-04-18 VITALS — BP 97/59 | HR 98 | Temp 97.1°F | Ht 64.0 in | Wt 142.2 lb

## 2019-04-18 DIAGNOSIS — J189 Pneumonia, unspecified organism: Secondary | ICD-10-CM

## 2019-04-18 DIAGNOSIS — C50211 Malignant neoplasm of upper-inner quadrant of right female breast: Secondary | ICD-10-CM

## 2019-04-18 DIAGNOSIS — I959 Hypotension, unspecified: Secondary | ICD-10-CM | POA: Diagnosis not present

## 2019-04-18 DIAGNOSIS — Z170 Estrogen receptor positive status [ER+]: Secondary | ICD-10-CM | POA: Diagnosis not present

## 2019-04-18 DIAGNOSIS — J181 Lobar pneumonia, unspecified organism: Secondary | ICD-10-CM | POA: Diagnosis not present

## 2019-04-18 NOTE — Patient Instructions (Signed)
Community-Acquired Pneumonia, Adult °Pneumonia is an infection of the lungs. It causes swelling in the airways of the lungs. Mucus and fluid may also build up inside the airways. °One type of pneumonia can happen while a person is in a hospital. A different type can happen when a person is not in a hospital (community-acquired pneumonia).  °What are the causes? ° °This condition is caused by germs (viruses, bacteria, or fungi). Some types of germs can be passed from one person to another. This can happen when you breathe in droplets from the cough or sneeze of an infected person. °What increases the risk? °You are more likely to develop this condition if you: °· Have a long-term (chronic) disease, such as: °? Chronic obstructive pulmonary disease (COPD). °? Asthma. °? Cystic fibrosis. °? Congestive heart failure. °? Diabetes. °? Kidney disease. °· Have HIV. °· Have sickle cell disease. °· Have had your spleen removed. °· Do not take good care of your teeth and mouth (poor dental hygiene). °· Have a medical condition that increases the risk of breathing in droplets from your own mouth and nose. °· Have a weakened body defense system (immune system). °· Are a smoker. °· Travel to areas where the germs that cause this illness are common. °· Are around certain animals or the places they live. °What are the signs or symptoms? °· A dry cough. °· A wet (productive) cough. °· Fever. °· Sweating. °· Chest pain. This often happens when breathing deeply or coughing. °· Fast breathing or trouble breathing. °· Shortness of breath. °· Shaking chills. °· Feeling tired (fatigue). °· Muscle aches. °How is this treated? °Treatment for this condition depends on many things. Most adults can be treated at home. In some cases, treatment must happen in a hospital. Treatment may include: °· Medicines given by mouth or through an IV tube. °· Being given extra oxygen. °· Respiratory therapy. °In rare cases, treatment for very bad pneumonia  may include: °· Using a machine to help you breathe. °· Having a procedure to remove fluid from around your lungs. °Follow these instructions at home: °Medicines °· Take over-the-counter and prescription medicines only as told by your doctor. °? Only take cough medicine if you are losing sleep. °· If you were prescribed an antibiotic medicine, take it as told by your doctor. Do not stop taking the antibiotic even if you start to feel better. °General instructions ° °· Sleep with your head and neck raised (elevated). You can do this by sleeping in a recliner or by putting a few pillows under your head. °· Rest as needed. Get at least 8 hours of sleep each night. °· Drink enough water to keep your pee (urine) pale yellow. °· Eat a healthy diet that includes plenty of vegetables, fruits, whole grains, low-fat dairy products, and lean protein. °· Do not use any products that contain nicotine or tobacco. These include cigarettes, e-cigarettes, and chewing tobacco. If you need help quitting, ask your doctor. °· Keep all follow-up visits as told by your doctor. This is important. °How is this prevented? °A shot (vaccine) can help prevent pneumonia. Shots are often suggested for: °· People older than 74 years of age. °· People older than 74 years of age who: °? Are having cancer treatment. °? Have long-term (chronic) lung disease. °? Have problems with their body's defense system. °You may also prevent pneumonia if you take these actions: °· Get the flu (influenza) shot every year. °· Go to the dentist as   often as told. °· Wash your hands often. If you cannot use soap and water, use hand sanitizer. °Contact a doctor if: °· You have a fever. °· You lose sleep because your cough medicine does not help. °Get help right away if: °· You are short of breath and it gets worse. °· You have more chest pain. °· Your sickness gets worse. This is very serious if: °? You are an older adult. °? Your body's defense system is weak. °· You  cough up blood. °Summary °· Pneumonia is an infection of the lungs. °· Most adults can be treated at home. Some will need treatment in a hospital. °· Drink enough water to keep your pee pale yellow. °· Get at least 8 hours of sleep each night. °This information is not intended to replace advice given to you by your health care provider. Make sure you discuss any questions you have with your health care provider. °Document Released: 02/15/2008 Document Revised: 12/19/2018 Document Reviewed: 04/26/2018 °Elsevier Patient Education © 2020 [employer] ° °

## 2019-04-18 NOTE — Progress Notes (Signed)
   Subjective:    Patient ID: Phyllis Walker, female    DOB: August 28, 1946, 73 y.o.   MRN: [id number]  Chief Complaint  Patient presents with  . recheck pneumonia    discuss blood pressure    HPI PT presents to the office today to recheck CAP. She states she is feeling great and has completed her doxycyline. She states she "might cough three times a day", but not productive or SOB.   Her BP is low today. She has  Right breast cancer and had radiation starting in December. She states she has lost 38 lbs over the last 8 months. She is currently taking 1/2 tablet of benazepril-HCTZ 20-12.5 mg. She denies any edema, chest pain, SOB, or headaches. She is fatigued.    Review of Systems  All other systems reviewed and are negative.      Objective:   Physical Exam Vitals signs reviewed.  Constitutional:      General: She is not in acute distress.    Appearance: She is well-developed.  HENT:     Head: Normocephalic and atraumatic.  Eyes:     Pupils: Pupils are equal, round, and reactive to light.  Neck:     Musculoskeletal: Normal range of motion and neck supple.     Thyroid: No thyromegaly.  Cardiovascular:     Rate and Rhythm: Normal rate and regular rhythm.     Heart sounds: Normal heart sounds. No murmur.  Pulmonary:     Effort: Pulmonary effort is normal. No respiratory distress.     Breath sounds: Normal breath sounds. No wheezing.  Abdominal:     General: Bowel sounds are normal. There is no distension.     Palpations: Abdomen is soft.     Tenderness: There is no abdominal tenderness.  Musculoskeletal: Normal range of motion.        General: No tenderness.  Skin:    General: Skin is warm and dry.  Neurological:     Mental Status: She is alert and oriented to person, place, and time.     Cranial Nerves: No cranial nerve deficit.     Deep Tendon Reflexes: Reflexes are normal and symmetric.  Psychiatric:        Behavior: Behavior normal.        Thought Content: Thought  content normal.        Judgment: Judgment normal.     BP (!) 97/59   Pulse 98   Temp (!) 97.1 F (36.2 C) (Oral)   Ht 5\' 4"  (1.626 m)   Wt 142 lb 3.2 oz (64.5 kg)   BMI 24.41 kg/m      Assessment & Plan:  Phyllis Walker comes in today with chief complaint of recheck pneumonia (discuss blood pressure)   Diagnosis and orders addressed:  1. Community acquired pneumonia, unspecified laterality Resolved  Rest Force fluids - DG Chest 2 View; Future  2. Hypotension, unspecified hypotension type Stop Benazepril-HCTZ Follow up in 2 weeks   3. Malignant neoplasm of upper-inner quadrant of right breast in female, estrogen receptor positive ([place]) Keep Oncologists follow up  Labs reviewed from 04/04/19 Health Maintenance reviewed Diet and exercise encouraged  Follow up plan: 2 weeks   Evelina Dun, FNP

## 2019-04-19 ENCOUNTER — Other Ambulatory Visit: Payer: Self-pay | Admitting: Family

## 2019-04-19 DIAGNOSIS — C50211 Malignant neoplasm of upper-inner quadrant of right female breast: Secondary | ICD-10-CM

## 2019-04-19 DIAGNOSIS — J189 Pneumonia, unspecified organism: Secondary | ICD-10-CM

## 2019-04-19 DIAGNOSIS — Z170 Estrogen receptor positive status [ER+]: Secondary | ICD-10-CM

## 2019-04-19 MED ORDER — LEVOFLOXACIN 750 MG PO TABS: 750.0000 mg | ORAL_TABLET | Freq: Every day | ORAL | 0 refills | Status: AC

## 2019-04-21 IMAGING — MG MM PLC BREAST LOC DEV 1ST LESION INC*R*
8 of 10 series · 8 of 10 positions shown · non-contrast
Comparison: Previous exam(s).

CLINICAL DATA: Localize known cancer in the medial superior right
breast. Localize ADURSH in the anterior right breast.

EXAM:
MAMMOGRAPHIC GUIDED RADIOACTIVE SEED LOCALIZATION OF THE RIGHT
BREAST

[R ML (1 of 5)]
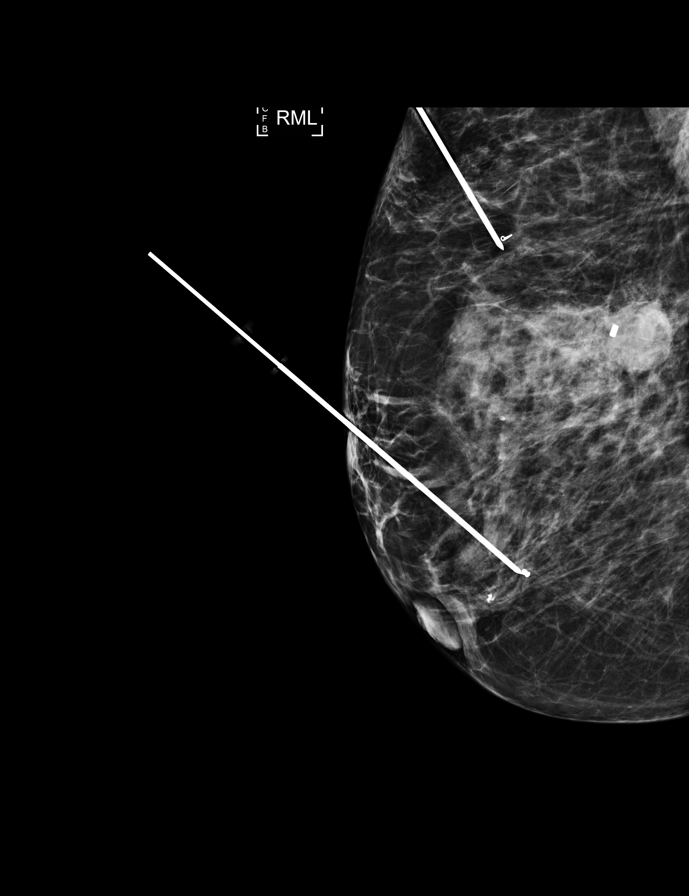

[R ML (2 of 5)]
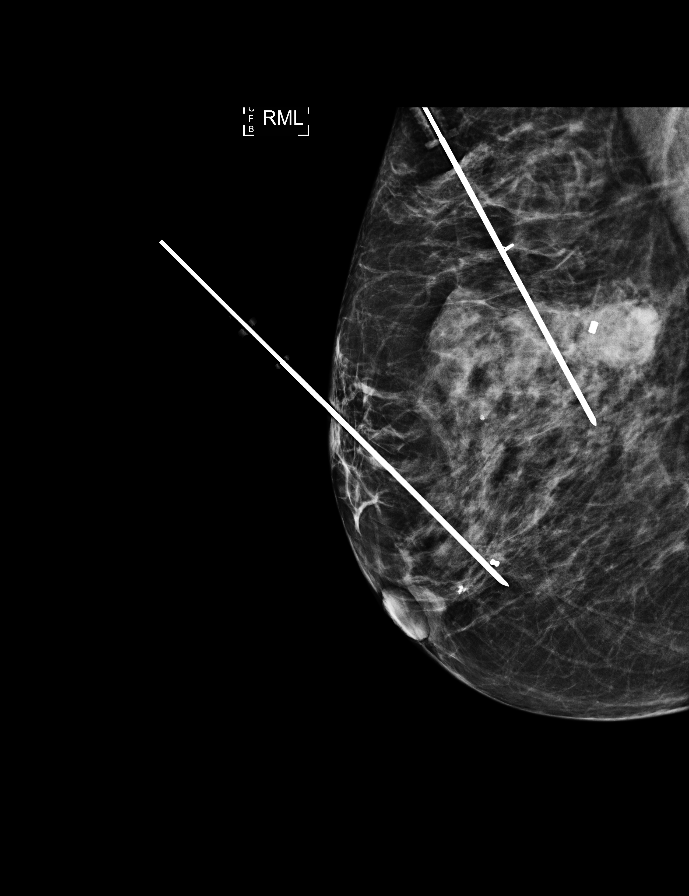

[R CC (1 of 3)]
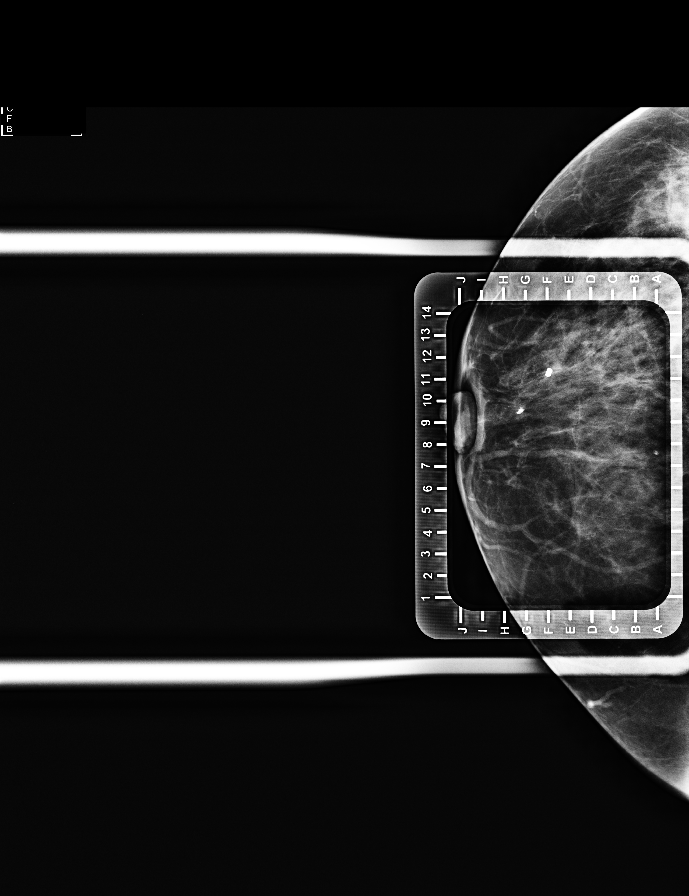

[R ML (3 of 5)]
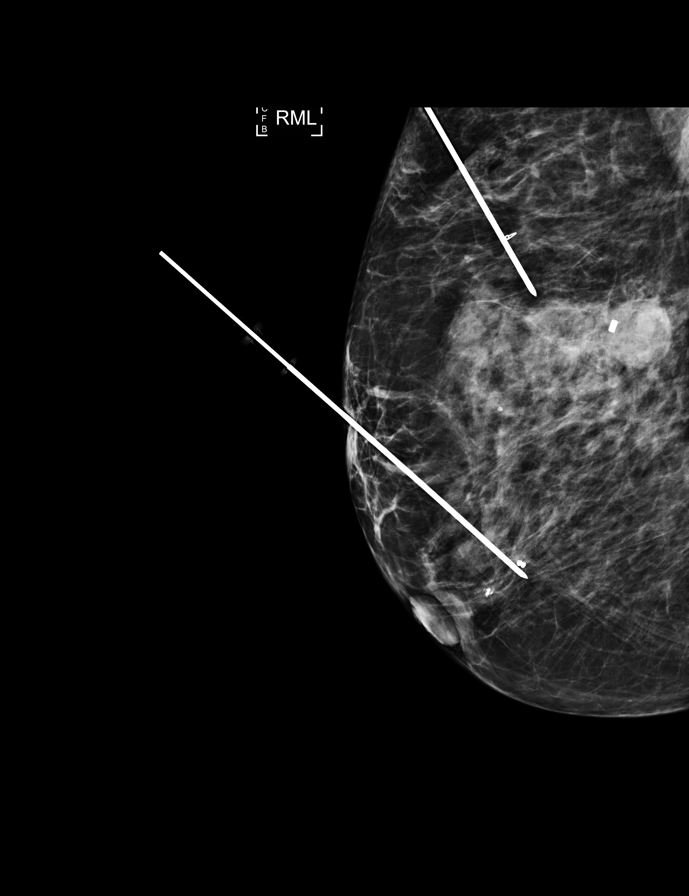

[R ML (4 of 5)]
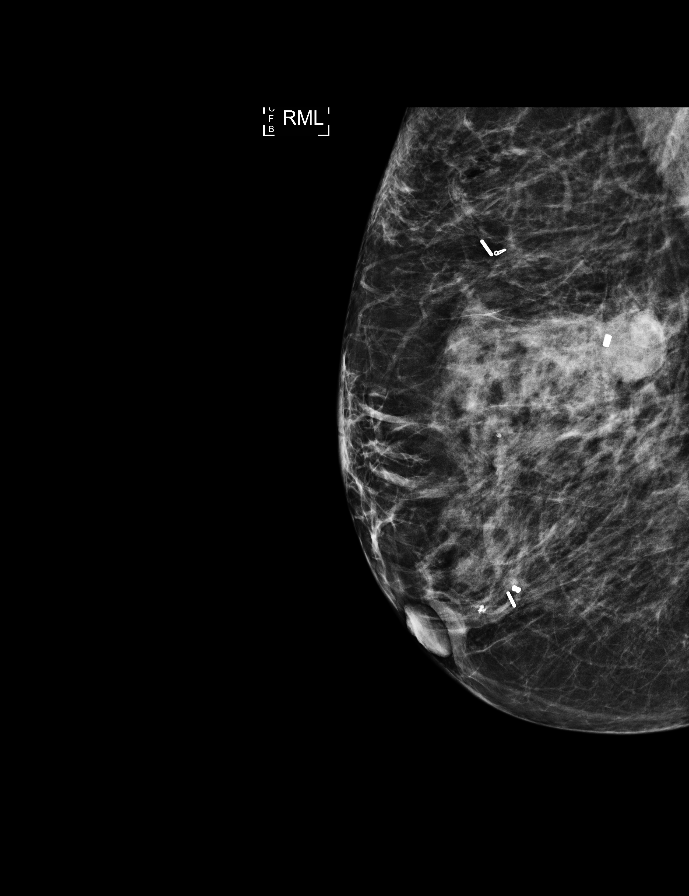

[R ML (5 of 5)]
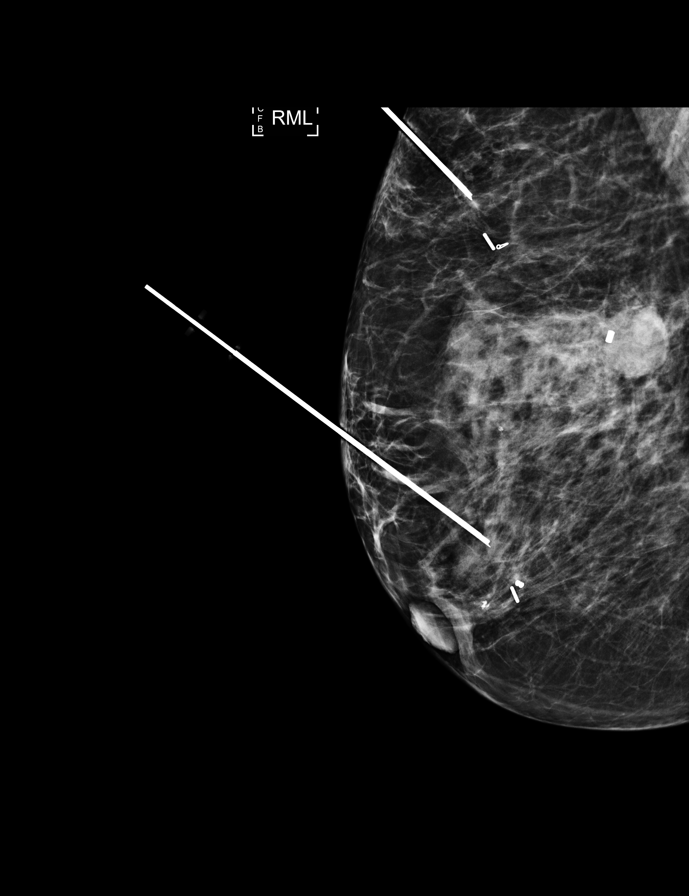

[R CC (2 of 3)]
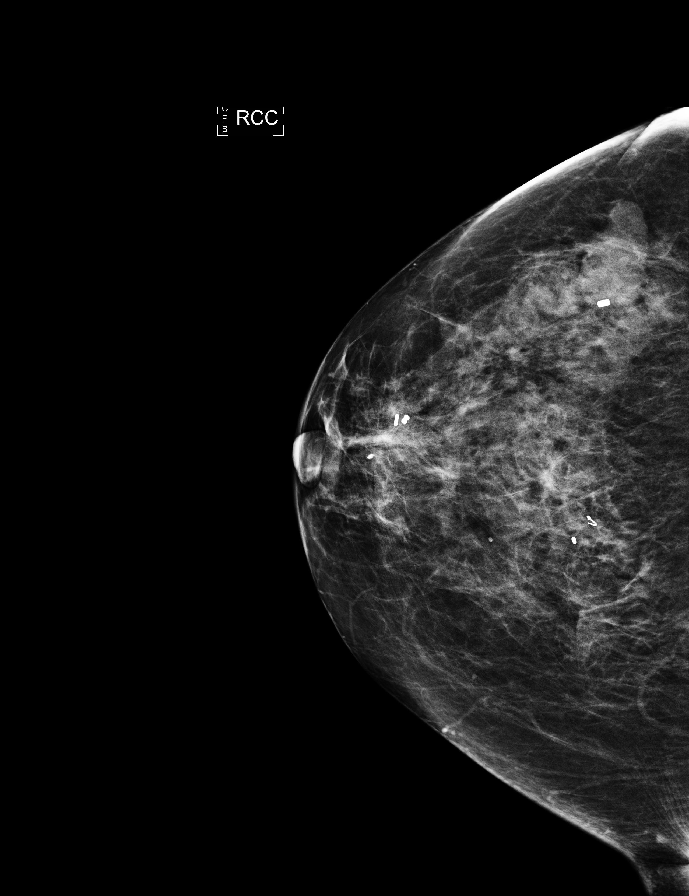

[R CC (3 of 3)]
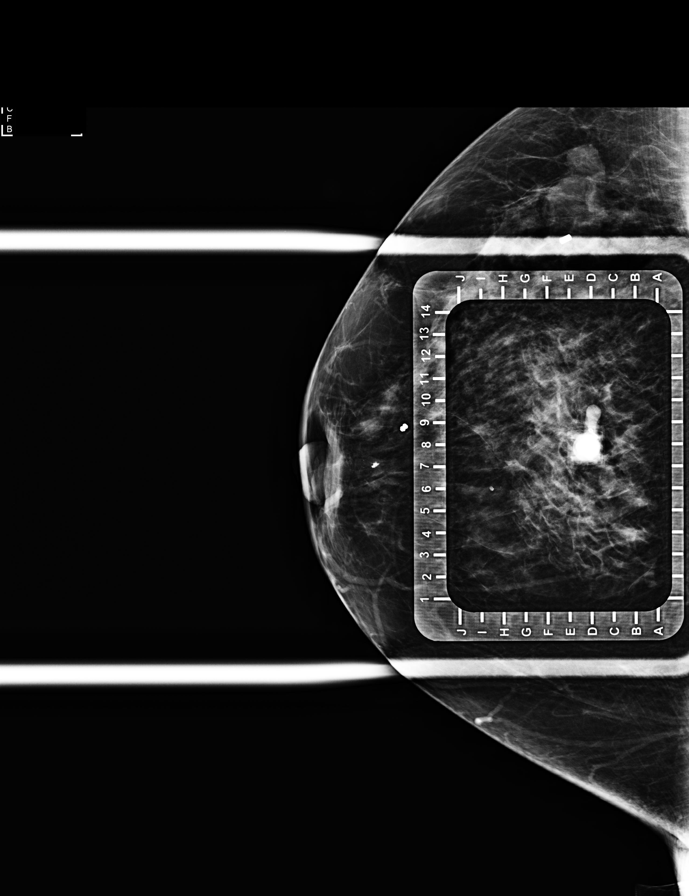

[8 of 10 positions shown; findings below may reference images not displayed]



The usual time-out protocol was performed immediately prior to the
procedure.

Using mammographic guidance, sterile technique, 1% lidocaine and an
T-FW1 radioactive seed, the known cancer was localized using a
superior approach. The follow-up mammogram images confirm the seed
in the expected location and were marked for the surgeon.

Follow-up survey of the patient confirms presence of the radioactive
seed.

Order number of T-FW1 seed:  [id number].

Total activity:  0.248 millicuries reference Date: June 20, 2018

Using mammographic guidance, sterile technique, 1% lidocaine and an
T-FW1 radioactive seed, the known complex sclerosing lesion was
localized using a superior approach. The follow-up mammogram images
confirm the seed in the expected location and were marked for the
surgeon.

Follow-up survey of the patient confirms presence of the radioactive
seed.

Order number of T-FW1 seed:  [id number].

Total activity:  0.248 millicuries reference Date: June 20, 2018

The patient tolerated the procedure well and was released from the
[REDACTED]. She was given instructions regarding seed removal.
IMPRESSION: Radioactive seed localization right breast. No apparent
complications.

## 2019-04-23 ENCOUNTER — Ambulatory Visit (HOSPITAL_COMMUNITY)
Admission: RE | Admit: 2019-04-23 | Discharge: 2019-04-23 | Disposition: A | Discharge status: Home / Self Care | Payer: Medicare HMO | Source: Ambulatory Visit | Attending: Family | Admitting: Family

## 2019-04-23 ENCOUNTER — Other Ambulatory Visit: Payer: Self-pay

## 2019-04-23 DIAGNOSIS — C50211 Malignant neoplasm of upper-inner quadrant of right female breast: Secondary | ICD-10-CM | POA: Insufficient documentation

## 2019-04-23 DIAGNOSIS — Z170 Estrogen receptor positive status [ER+]: Secondary | ICD-10-CM

## 2019-04-23 DIAGNOSIS — R918 Other nonspecific abnormal finding of lung field: Secondary | ICD-10-CM | POA: Diagnosis not present

## 2019-04-23 DIAGNOSIS — J189 Pneumonia, unspecified organism: Secondary | ICD-10-CM

## 2019-04-25 ENCOUNTER — Other Ambulatory Visit: Payer: Self-pay | Admitting: Family

## 2019-04-25 DIAGNOSIS — J189 Pneumonia, unspecified organism: Secondary | ICD-10-CM

## 2019-05-01 ENCOUNTER — Other Ambulatory Visit: Payer: Self-pay

## 2019-05-02 ENCOUNTER — Ambulatory Visit (INDEPENDENT_AMBULATORY_CARE_PROVIDER_SITE_OTHER): Payer: Medicare HMO | Admitting: Family

## 2019-05-02 ENCOUNTER — Encounter: Payer: Self-pay | Admitting: Family

## 2019-05-02 ENCOUNTER — Other Ambulatory Visit: Payer: Self-pay

## 2019-05-02 VITALS — BP 110/70 | HR 96 | Temp 96.8°F | Ht 64.0 in | Wt 143.0 lb

## 2019-05-02 DIAGNOSIS — J189 Pneumonia, unspecified organism: Secondary | ICD-10-CM

## 2019-05-02 DIAGNOSIS — I10 Essential (primary) hypertension: Secondary | ICD-10-CM | POA: Diagnosis not present

## 2019-05-02 DIAGNOSIS — E538 Deficiency of other specified B group vitamins: Secondary | ICD-10-CM | POA: Diagnosis not present

## 2019-05-02 DIAGNOSIS — I959 Hypotension, unspecified: Secondary | ICD-10-CM | POA: Diagnosis not present

## 2019-05-02 DIAGNOSIS — J301 Allergic rhinitis due to pollen: Secondary | ICD-10-CM

## 2019-05-02 MED ORDER — FLUTICASONE PROPIONATE 50 MCG/ACT NA SUSP: 2.0000 | Freq: Every day | NASAL | 6 refills | Status: DC

## 2019-05-02 NOTE — Progress Notes (Signed)
Subjective:    Patient ID: Phyllis Walker, female    DOB: [birth year], 73 y.o.   MRN: [id number]  Chief Complaint  Patient presents with  . Hypertension    HPI PT presents to the office today to recheck HTN and recheck recurrent pneumonia. She was seen on 04/18/19 and found had hypotension. We stopped her Benazepril-HCTZ. Her BP is at goal today!  She has been treated for pneumonia on and off since February. We sent her for CT scan on 04/23/19 that showed "Large right upper lobe airspace opacity is noted concerning for pneumonia. Also noted is mild left posterior lower lobe opacity concerning for pneumonia or possibly scarring.  8 mm pleural based nodular density is noted anteriorly in the left upper lobe with surrounding ground-glass opacity. Initial follow-up by chest CT without contrast is recommended in 3 months to confirm Persistence"  She has Pulmonologist appt 05/08/19. She reports she feels a lot better since her BP is not so low. She denies cough or  Fever. She states she has SOB with exertion.   She has right breast cancer and is followed by her Oncologists. Her recurrent pneumonia is worrisome for some type of malignancy.    Review of Systems  Respiratory: Positive for shortness of breath (with exertion ). Negative for cough.   All other systems reviewed and are negative.      Objective:   Physical Exam Vitals signs reviewed.  Constitutional:      General: She is not in acute distress.    Appearance: She is well-developed.  HENT:     Head: Normocephalic and atraumatic.     Right Ear: Tympanic membrane normal.     Left Ear: Tympanic membrane normal.  Eyes:     Pupils: Pupils are equal, round, and reactive to light.  Neck:     Musculoskeletal: Normal range of motion and neck supple.     Thyroid: No thyromegaly.  Cardiovascular:     Rate and Rhythm: Normal rate and regular rhythm.     Heart sounds: Normal heart sounds. No murmur.  Pulmonary:     Effort:  Pulmonary effort is normal. No respiratory distress.     Breath sounds: Normal breath sounds. No wheezing.  Abdominal:     General: Bowel sounds are normal. There is no distension.     Palpations: Abdomen is soft.     Tenderness: There is no abdominal tenderness.  Musculoskeletal: Normal range of motion.        General: No tenderness.  Skin:    General: Skin is warm and dry.  Neurological:     Mental Status: She is alert and oriented to person, place, and time.     Cranial Nerves: No cranial nerve deficit.     Deep Tendon Reflexes: Reflexes are normal and symmetric.  Psychiatric:        Behavior: Behavior normal.        Thought Content: Thought content normal.        Judgment: Judgment normal.     BP 110/70   Pulse 96   Temp (!) 96.8 F (36 C) (Temporal)   Ht _0  (1.626 m)   Wt 143 lb (64.9 kg)   BMI 24.55 kg/m      Assessment & Plan:  Phyllis Walker comes in today with chief complaint of Hypertension   Diagnosis and orders addressed:  1. Recurrent pneumonia - CBC with Differential/Platelet - BMP8+EGFR  2. Hypotension, unspecified hypotension type - CBC with  Differential/Platelet - BMP8+EGFR  3. Hypertension, benign essential, goal below 140/90 - CBC with Differential/Platelet - BMP8+EGFR  4. Allergic rhinitis due to pollen, unspecified seasonality - fluticasone (FLONASE) 50 MCG/ACT nasal spray; Place 2 sprays into both nostrils daily.  Dispense: 16 g; Refill: 6  Keep Pulmonologist appt Lungs sound good today, but strict precautions discussed for her to call if any cough, fever, SOB starts Force fluids HTN at goal today, will continue to hold medication Keep follow up with Oncologists  Evelina Dun, FNP

## 2019-05-02 NOTE — Patient Instructions (Signed)
Community-Acquired Pneumonia, Adult °Pneumonia is an infection of the lungs. It causes swelling in the airways of the lungs. Mucus and fluid may also build up inside the airways. °One type of pneumonia can happen while a person is in a hospital. A different type can happen when a person is not in a hospital (community-acquired pneumonia).  °What are the causes? ° °This condition is caused by germs (viruses, bacteria, or fungi). Some types of germs can be passed from one person to another. This can happen when you breathe in droplets from the cough or sneeze of an infected person. °What increases the risk? °You are more likely to develop this condition if you: °· Have a long-term (chronic) disease, such as: °? Chronic obstructive pulmonary disease (COPD). °? Asthma. °? Cystic fibrosis. °? Congestive heart failure. °? Diabetes. °? Kidney disease. °· Have HIV. °· Have sickle cell disease. °· Have had your spleen removed. °· Do not take good care of your teeth and mouth (poor dental hygiene). °· Have a medical condition that increases the risk of breathing in droplets from your own mouth and nose. °· Have a weakened body defense system (immune system). °· Are a smoker. °· Travel to areas where the germs that cause this illness are common. °· Are around certain animals or the places they live. °What are the signs or symptoms? °· A dry cough. °· A wet (productive) cough. °· Fever. °· Sweating. °· Chest pain. This often happens when breathing deeply or coughing. °· Fast breathing or trouble breathing. °· Shortness of breath. °· Shaking chills. °· Feeling tired (fatigue). °· Muscle aches. °How is this treated? °Treatment for this condition depends on many things. Most adults can be treated at home. In some cases, treatment must happen in a hospital. Treatment may include: °· Medicines given by mouth or through an IV tube. °· Being given extra oxygen. °· Respiratory therapy. °In rare cases, treatment for very bad pneumonia  may include: °· Using a machine to help you breathe. °· Having a procedure to remove fluid from around your lungs. °Follow these instructions at home: °Medicines °· Take over-the-counter and prescription medicines only as told by your doctor. °? Only take cough medicine if you are losing sleep. °· If you were prescribed an antibiotic medicine, take it as told by your doctor. Do not stop taking the antibiotic even if you start to feel better. °General instructions ° °· Sleep with your head and neck raised (elevated). You can do this by sleeping in a recliner or by putting a few pillows under your head. °· Rest as needed. Get at least 8 hours of sleep each night. °· Drink enough water to keep your pee (urine) pale yellow. °· Eat a healthy diet that includes plenty of vegetables, fruits, whole grains, low-fat dairy products, and lean protein. °· Do not use any products that contain nicotine or tobacco. These include cigarettes, e-cigarettes, and chewing tobacco. If you need help quitting, ask your doctor. °· Keep all follow-up visits as told by your doctor. This is important. °How is this prevented? °A shot (vaccine) can help prevent pneumonia. Shots are often suggested for: °· People older than 74 years of age. °· People older than 74 years of age who: °? Are having cancer treatment. °? Have long-term (chronic) lung disease. °? Have problems with their body's defense system. °You may also prevent pneumonia if you take these actions: °· Get the flu (influenza) shot every year. °· Go to the dentist as   often as told. °· Wash your hands often. If you cannot use soap and water, use hand sanitizer. °Contact a doctor if: °· You have a fever. °· You lose sleep because your cough medicine does not help. °Get help right away if: °· You are short of breath and it gets worse. °· You have more chest pain. °· Your sickness gets worse. This is very serious if: °? You are an older adult. °? Your body's defense system is weak. °· You  cough up blood. °Summary °· Pneumonia is an infection of the lungs. °· Most adults can be treated at home. Some will need treatment in a hospital. °· Drink enough water to keep your pee pale yellow. °· Get at least 8 hours of sleep each night. °This information is not intended to replace advice given to you by your health care provider. Make sure you discuss any questions you have with your health care provider. °Document Released: 02/15/2008 Document Revised: 12/19/2018 Document Reviewed: 04/26/2018 °Elsevier Patient Education © 2020 [employer] ° °

## 2019-05-03 LAB — CBC WITH DIFFERENTIAL/PLATELET
Basophils Absolute: 0.1 10*3/uL (ref 0.0–0.2)
Basos: 1 %
EOS (ABSOLUTE): 0.4 10*3/uL (ref 0.0–0.4)
Eos: 6 %
Hematocrit: 33.8 % — ABNORMAL LOW (ref 34.0–46.6)
Hemoglobin: 10.8 g/dL — ABNORMAL LOW (ref 11.1–15.9)
Immature Grans (Abs): 0 10*3/uL (ref 0.0–0.1)
Immature Granulocytes: 0 %
Lymphocytes Absolute: 1.2 10*3/uL (ref 0.7–3.1)
Lymphs: 17 %
MCH: 25.9 pg — ABNORMAL LOW (ref 26.6–33.0)
MCHC: 32 g/dL (ref 31.5–35.7)
MCV: 81 fL (ref 79–97)
Monocytes Absolute: 0.6 10*3/uL (ref 0.1–0.9)
Monocytes: 8 %
Neutrophils Absolute: 4.9 10*3/uL (ref 1.4–7.0)
Neutrophils: 68 %
Platelets: 309 10*3/uL (ref 150–450)
RBC: 4.17 x10E6/uL (ref 3.77–5.28)
RDW: 14.1 % (ref 11.7–15.4)
WBC: 7.3 10*3/uL (ref 3.4–10.8)

## 2019-05-03 LAB — BMP8+EGFR
BUN/Creatinine Ratio: 18 (ref 12–28)
BUN: 9 mg/dL (ref 8–27)
CO2: 25 mmol/L (ref 20–29)
Calcium: 8.6 mg/dL — ABNORMAL LOW (ref 8.7–10.3)
Chloride: 102 mmol/L (ref 96–106)
Creatinine, Ser: 0.49 mg/dL — ABNORMAL LOW (ref 0.57–1.00)
GFR calc Af Amer: 112 mL/min/{1.73_m2} (ref >59)
GFR calc non Af Amer: 97 mL/min/{1.73_m2} (ref >59)
Glucose: 85 mg/dL (ref 65–99)
Potassium: 4 mmol/L (ref 3.5–5.2)
Sodium: 141 mmol/L (ref 134–144)

## 2019-05-07 ENCOUNTER — Institutional Professional Consult (permissible substitution): Payer: Medicare HMO | Admitting: Pulmonary Disease

## 2019-05-08 ENCOUNTER — Ambulatory Visit: Payer: Medicare HMO | Admitting: Pulmonary Disease

## 2019-05-08 ENCOUNTER — Other Ambulatory Visit: Payer: Self-pay

## 2019-05-08 ENCOUNTER — Encounter: Payer: Self-pay | Admitting: Pulmonary Disease

## 2019-05-08 VITALS — BP 120/64 | HR 90 | Temp 97.3°F | Ht 64.0 in | Wt 143.6 lb

## 2019-05-08 DIAGNOSIS — J189 Pneumonia, unspecified organism: Secondary | ICD-10-CM

## 2019-05-08 DIAGNOSIS — R1310 Dysphagia, unspecified: Secondary | ICD-10-CM

## 2019-05-08 MED ORDER — TRELEGY ELLIPTA 100-62.5-25 MCG/INH IN AEPB: 1.0000 | INHALATION_SPRAY | Freq: Every day | RESPIRATORY_TRACT | 0 refills | Status: DC

## 2019-05-08 MED ORDER — TRELEGY ELLIPTA 100-62.5-25 MCG/INH IN AEPB: 1.0000 | INHALATION_SPRAY | Freq: Every day | RESPIRATORY_TRACT | 3 refills | Status: DC

## 2019-05-08 NOTE — Progress Notes (Signed)
Phyllis Walker    CN:[id number]    1946-04-09  Primary Care Physician:Martin, Mary-Margaret, FNP  Referring Physician: Chevis Pretty, [address] [place],  [place] [address]  Chief complaint: Consult for recurrent pneumonia  HPI: 73 year old with history of breast cancer status post lumpectomy, XRT, scoliosis, ex-smoker Diagnosed with breast cancer in in November 2019.  Underwent lumpectomy and XRT.  Last dose of radiation was 10/03/2018. Had 4 episodes of recurrent pneumonia since early February.  Treated with 4 rounds of antibiotics and 2 rounds of prednisone.  Tested negative for SARS-CoV-2 in June 2020   Chief complaint is chronic dyspnea on exertion, cough with white mucus.  Denies any wheezing, chills, fevers, hemoptysis Has occasional symptoms of acid reflux.  Reports episodes of choking on food while swallowing with paroxysmal coughing  Pets: Has a cat.  No dogs, birds, farm animals. 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 Exposures: No known exposures.  No mold, hot tub, Jacuzzi. Smoking history: 80 pack-year smoker.  Quit in 2007. Travel history: No significant travel history. Relevant family history: No significant family history of lung disease.  Outpatient Encounter Medications as of 05/08/2019  Medication Sig  . albuterol (VENTOLIN HFA) 108 (90 Base) MCG/ACT inhaler 2 PUFFS EVERY 6 HOURS AS NEEDED FOR WHEEZING OR SHORTNESS OF BREATH  . ALPRAZolam (XANAX) 0.25 MG tablet Take 1 tablet (0.25 mg total) by mouth 2 (two) times daily as needed for anxiety.  . fluticasone (FLONASE) 50 MCG/ACT nasal spray Place 2 sprays into both nostrils daily.  [name] gabapentin (NEURONTIN) 100 MG capsule Take 1 capsule (100 mg total) by mouth 3 (three) times daily.  [name] levothyroxine (SYNTHROID) 75 MCG tablet Take 1 tablet (75 mcg total) by mouth daily.  . Omega-3 Fatty Acids (FISH OIL) 1200 MG CAPS Take 1,200 mg by mouth daily.  [name] rOPINIRole (REQUIP) 0.25 MG  tablet Take 1 tablet (0.25 mg total) by mouth 3 (three) times daily.  . sertraline (ZOLOFT) 50 MG tablet Take 1 tablet (50 mg total) by mouth daily.  [name] zolpidem (AMBIEN) 10 MG tablet Take 1 tablet (10 mg total) by mouth at bedtime as needed. for sleep   Facility-Administered Encounter Medications as of 05/08/2019  Medication  . cyanocobalamin ((VITAMIN B-12)) injection 1,000 mcg    Allergies as of 05/08/2019 - Review Complete 05/08/2019  Allergen Reaction Noted  . Amoxicillin-pot clavulanate Nausea And Vomiting and Other (See Comments) 11/10/2014    Past Medical History:  Diagnosis Date  . Anemia   . Anxiety   . Arthritis   . Breast cancer, right breast ([facility]) 06/2018  . Cataract   . Depression   . Fibroid tumor 1985   in breast  . History of hiatal hernia   . Hypercholesteremia   . Hypertension   . Hypothyroidism   . Neuropathy    in feet  . Restless leg syndrome   . 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 BREAST LUMPECTOMY  1987   benign fibroid tumor  . BREAST LUMPECTOMY WITH RADIOACTIVE SEED AND SENTINEL LYMPH NODE BIOPSY Right 07/05/2018   Procedure: RIGHT BREAST LUMPECTOMY WITH RADIOACTIVE SEED X 2 AND RIGHT AXILLARY SENTINEL LYMPH NODE BIOPSY;  Surgeon: Alphonsa Overall, MD;  Location: [facility];  Service: General;  Laterality: Right;  . BUNIONECTOMY Bilateral   . CATARACT EXTRACTION    . COLONOSCOPY    . THYROIDECTOMY, PARTIAL  [place]    . TOTAL KNEE ARTHROPLASTY Right 08/15/2016   Procedure: TOTAL KNEE ARTHROPLASTY;  Surgeon: Vickey Huger, MD;  Location: [facility];  Service: Orthopedics;  Laterality: Right;  RNFA    Family History  Problem Relation Age of Onset  . Cancer Mother        pancreas  . Heart attack Father   . Hypertension Sister   . Hyperlipidemia Sister   . Anuerysm Sister   . Hypertension Sister   . Hyperlipidemia Sister   . Early death Sister 11        accident  . Ovarian cancer Maternal Aunt     Social History   Socioeconomic History  . Marital status: Divorced    Spouse name: Not on file  . Number of children: 2  . Years of education: 75  . Highest education level: High school graduate  Occupational History  . Occupation: retired  Scientific laboratory technician  . Financial resource strain: Not hard at all  . Food insecurity    Worry: Never true    Inability: Never true  . Transportation needs    Medical: No    Non-medical: No  Tobacco Use  . Smoking status: Former Smoker    Packs/day: 2.00    Years: 40.00    Pack years: 80.00    Types: Cigarettes    Quit date: 12/21/2006    Years since quitting: 12.3  . Smokeless tobacco: Never Used  Substance and Sexual Activity  . Alcohol use: No  . Drug use: No  . Sexual activity: Not Currently  Lifestyle  . Physical activity    Days per week: 0 days    Minutes per session: 0 min  . Stress: Not at all  Relationships  . Social connections    Talks on phone: More than three times a week    Gets together: More than three times a week    Attends religious service: More than 4 times per year    Active member of club or organization: No    Attends meetings of clubs or organizations: Never    Relationship status: Divorced  . Intimate partner violence    Fear of current or ex partner: No    Emotionally abused: No    Physically abused: No    Forced sexual activity: No  Other Topics Concern  . Not on file  Social History Narrative   Lives alone has one cat    Review of systems: Review of Systems  Constitutional: Negative for fever and chills.  HENT: Negative.   Eyes: Negative for blurred vision.  Respiratory: as per HPI  Cardiovascular: Negative for chest pain and palpitations.  Gastrointestinal: Negative for vomiting, diarrhea, blood per rectum. Genitourinary: Negative for dysuria, urgency, frequency and hematuria.  Musculoskeletal: Negative for myalgias, back pain and joint pain.   Skin: Negative for itching and rash.  Neurological: Negative for dizziness, tremors, focal weakness, seizures and loss of consciousness.  Endo/Heme/Allergies: Negative for environmental allergies.  Psychiatric/Behavioral: Negative for depression, suicidal ideas and hallucinations.  All other systems reviewed and are negative.  Physical Exam: Blood pressure 120/64, pulse 90, temperature (!) 97.3 F (36.3 C), temperature source Temporal, height 5\' 4"  (1.626 m), weight 143 lb 9.6 oz (65.1 kg), SpO2 96 %. Gen:      No acute distress HEENT:  EOMI, sclera anicteric Neck:     No masses; no thyromegaly Lungs:    Clear to auscultation bilaterally; normal respiratory effort CV:  Regular rate and rhythm; no murmurs Abd:      + bowel sounds; soft, non-tender; no palpable masses, no distension Ext:    No edema; adequate peripheral perfusion Skin:      Warm and dry; no rash Neuro: alert and oriented x 3 Psych: normal mood and affect  Data Reviewed: Imaging: Chest x-ray 01/25/2019- right midlung opacity Chest x-ray 03/06/2019- new right lower lobe airspace disease Chest x-ray 03/21/2019- improved right lower lobe opacity Chest x-ray 04/18/2019- resolution of right lower lobe opacity.  New opacity in the right upper lobe concerning for pneumonia CT chest 04/13/2019- right upper lobe airspace opacity concerning for pneumonia.  Posterior left lower lobe opacity.  Possible underlying scarring. 8 mm nodule in the left upper lobe. I have reviewed the images personally.  Labs: CBC 05/02/2019-WBC 7.3, eos 6%, absolute eosinophil count 438  Assessment:  Reccurent Pneumonia Differential diagnosis is COPD exacerbation, aspiration pneumonia Less likely to be radiation pneumonitis as the airspace opacities show up in different areas of the lung.  Given her symptoms of choking on food she will need an evaluation for recurrent aspiration Scoliosis is a chronic condition and may be contributing to  hypoventilation and lung restriction  Schedule pulmonary function test Start Trelegy inhaler.  She will benefit from inhaled corticosteroid with elevated peripheral eosinophils.  Will need to be cognizant of increased risk of pneumonia with the inhaled corticosteroid Schedule modified barium swallow for aspiration evaluation Follow-up CT in 3 months to ensure there is no underlying malignancy.  Plan/Recommendations: - PFTs - Trelegy inhaler - Modified barium swallow - CT chest in 3 months  Marshell Garfinkel MD [facility] 05/08/2019, 11:20 AM  CC: Hassell Done, Mary-Margaret, *

## 2019-05-08 NOTE — Patient Instructions (Signed)
We will start you on an inhaler called Trelegy Schedule you for pulmonary function test CT chest without contrast in 3 months to ensure that the pneumonia is clearing  I am concerned that you may be choking and aspirating on food We will get a study called modified barium swallow to evaluate this Follow-up in 3 months.

## 2019-05-09 ENCOUNTER — Other Ambulatory Visit (HOSPITAL_COMMUNITY): Payer: Self-pay

## 2019-05-09 DIAGNOSIS — R1310 Dysphagia, unspecified: Secondary | ICD-10-CM

## 2019-05-14 ENCOUNTER — Ambulatory Visit (HOSPITAL_COMMUNITY)
Admission: RE | Admit: 2019-05-14 | Discharge: 2019-05-14 | Disposition: A | Discharge status: Home / Self Care | Payer: Medicare HMO | Source: Ambulatory Visit | Attending: Pulmonary Disease | Admitting: Pulmonary Disease

## 2019-05-14 ENCOUNTER — Other Ambulatory Visit: Payer: Self-pay

## 2019-05-14 DIAGNOSIS — J189 Pneumonia, unspecified organism: Secondary | ICD-10-CM | POA: Insufficient documentation

## 2019-05-14 DIAGNOSIS — M1712 Unilateral primary osteoarthritis, left knee: Secondary | ICD-10-CM | POA: Diagnosis not present

## 2019-05-14 DIAGNOSIS — R1310 Dysphagia, unspecified: Secondary | ICD-10-CM | POA: Diagnosis not present

## 2019-05-14 DIAGNOSIS — R05 Cough: Secondary | ICD-10-CM | POA: Diagnosis not present

## 2019-05-28 ENCOUNTER — Other Ambulatory Visit: Payer: Self-pay | Admitting: Hematology and Oncology

## 2019-05-28 ENCOUNTER — Telehealth: Payer: Self-pay | Admitting: Hematology and Oncology

## 2019-05-28 DIAGNOSIS — R69 Illness, unspecified: Secondary | ICD-10-CM | POA: Diagnosis not present

## 2019-05-28 NOTE — Telephone Encounter (Signed)
Scheduled appt per 9/15 sch message - pt is aware of appt date and  Time

## 2019-05-31 ENCOUNTER — Other Ambulatory Visit: Payer: Self-pay

## 2019-06-03 ENCOUNTER — Other Ambulatory Visit: Payer: Self-pay

## 2019-06-03 ENCOUNTER — Ambulatory Visit (INDEPENDENT_AMBULATORY_CARE_PROVIDER_SITE_OTHER): Payer: Medicare HMO

## 2019-06-03 DIAGNOSIS — E538 Deficiency of other specified B group vitamins: Secondary | ICD-10-CM | POA: Diagnosis not present

## 2019-06-03 DIAGNOSIS — Z23 Encounter for immunization: Secondary | ICD-10-CM | POA: Diagnosis not present

## 2019-06-03 NOTE — Progress Notes (Signed)
Cyanocobalamin injection given to left upper outer quadrant. Patient tolerated well.

## 2019-06-05 DIAGNOSIS — R922 Inconclusive mammogram: Secondary | ICD-10-CM | POA: Diagnosis not present

## 2019-06-05 DIAGNOSIS — Z170 Estrogen receptor positive status [ER+]: Secondary | ICD-10-CM | POA: Diagnosis not present

## 2019-06-05 DIAGNOSIS — C50211 Malignant neoplasm of upper-inner quadrant of right female breast: Secondary | ICD-10-CM | POA: Diagnosis not present

## 2019-06-12 DIAGNOSIS — Z87891 Personal history of nicotine dependence: Secondary | ICD-10-CM | POA: Diagnosis not present

## 2019-06-12 DIAGNOSIS — Z923 Personal history of irradiation: Secondary | ICD-10-CM | POA: Diagnosis not present

## 2019-06-12 DIAGNOSIS — Z79811 Long term (current) use of aromatase inhibitors: Secondary | ICD-10-CM | POA: Diagnosis not present

## 2019-06-12 DIAGNOSIS — Z170 Estrogen receptor positive status [ER+]: Secondary | ICD-10-CM | POA: Diagnosis not present

## 2019-06-12 DIAGNOSIS — Z9889 Other specified postprocedural states: Secondary | ICD-10-CM | POA: Diagnosis not present

## 2019-06-12 DIAGNOSIS — C50211 Malignant neoplasm of upper-inner quadrant of right female breast: Secondary | ICD-10-CM | POA: Diagnosis not present

## 2019-06-18 DIAGNOSIS — R69 Illness, unspecified: Secondary | ICD-10-CM | POA: Diagnosis not present

## 2019-06-28 ENCOUNTER — Other Ambulatory Visit: Payer: Self-pay

## 2019-07-01 ENCOUNTER — Ambulatory Visit (INDEPENDENT_AMBULATORY_CARE_PROVIDER_SITE_OTHER): Payer: Medicare HMO

## 2019-07-01 ENCOUNTER — Other Ambulatory Visit: Payer: Self-pay

## 2019-07-01 DIAGNOSIS — E538 Deficiency of other specified B group vitamins: Secondary | ICD-10-CM | POA: Diagnosis not present

## 2019-07-01 NOTE — Progress Notes (Signed)
Cyanocobalamin injection given to left deltoid.  Patient tolerated well. 

## 2019-07-31 ENCOUNTER — Other Ambulatory Visit: Payer: Self-pay

## 2019-07-31 ENCOUNTER — Ambulatory Visit: Payer: Medicare HMO | Admitting: Pulmonary Disease

## 2019-08-01 ENCOUNTER — Ambulatory Visit (INDEPENDENT_AMBULATORY_CARE_PROVIDER_SITE_OTHER): Payer: Medicare HMO

## 2019-08-01 DIAGNOSIS — E538 Deficiency of other specified B group vitamins: Secondary | ICD-10-CM

## 2019-08-01 NOTE — Progress Notes (Signed)
Cyanocobalamin injection given to left deltoid.  Patient tolerated well. 

## 2019-08-12 ENCOUNTER — Other Ambulatory Visit: Payer: Self-pay

## 2019-08-12 ENCOUNTER — Ambulatory Visit (INDEPENDENT_AMBULATORY_CARE_PROVIDER_SITE_OTHER)
Admission: RE | Admit: 2019-08-12 | Discharge: 2019-08-12 | Disposition: A | Discharge status: Home / Self Care | Payer: Medicare HMO | Source: Ambulatory Visit | Attending: Pulmonary Disease | Admitting: Pulmonary Disease

## 2019-08-12 DIAGNOSIS — J181 Lobar pneumonia, unspecified organism: Secondary | ICD-10-CM | POA: Diagnosis not present

## 2019-08-12 DIAGNOSIS — J189 Pneumonia, unspecified organism: Secondary | ICD-10-CM | POA: Diagnosis not present

## 2019-08-21 ENCOUNTER — Other Ambulatory Visit: Payer: Self-pay

## 2019-08-21 ENCOUNTER — Encounter: Payer: Self-pay | Admitting: Pulmonary Disease

## 2019-08-21 ENCOUNTER — Ambulatory Visit: Payer: Medicare HMO | Admitting: Pulmonary Disease

## 2019-08-21 VITALS — BP 120/74 | HR 75 | Temp 97.3°F | Ht 64.0 in | Wt 165.2 lb

## 2019-08-21 DIAGNOSIS — J189 Pneumonia, unspecified organism: Secondary | ICD-10-CM

## 2019-08-21 NOTE — Patient Instructions (Signed)
I am glad you are feeling better with regard to your breathing He can stop the Trelegy if you feel is not making a difference and due to cost Your CT scan shows improving lung infiltrates  We will order a follow-up CT in 6 months to continue monitoring.  We will also order pulmonary function tests around that time and [facility] hospital Follow-up in 6 months.

## 2019-08-21 NOTE — Progress Notes (Signed)
Phyllis Walker    [id number]    [birth year]  Primary Care 55, Mary-Margaret, FNP  Referring Physician: Chevis Pretty, [address] [place],  [place] [address]  Chief complaint: Follow up for recurrent pneumonia  HPI: 73 year old with history of breast cancer status post lumpectomy, XRT, scoliosis, ex-smoker Diagnosed with breast cancer in in November 2019.  Underwent lumpectomy and XRT.  Last dose of radiation was 10/03/2018. Had 4 episodes of recurrent pneumonia since early February.  Treated with 4 rounds of antibiotics and 2 rounds of prednisone.  Tested negative for SARS-CoV-2 in June 2020   Chief complaint is chronic dyspnea on exertion, cough with white mucus.  Denies any wheezing, chills, fevers, hemoptysis Has occasional symptoms of acid reflux.  Reports episodes of choking on food while swallowing with paroxysmal coughing  Pets: Has a cat.  No dogs, birds, farm animals. 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 Exposures: No known exposures.  No mold, hot tub, Jacuzzi. Smoking history: 80 pack-year smoker.  Quit in 2007. Travel history: No significant travel history. Relevant family history: No significant family history of lung disease.  Interim history: States that she is doing much better with improvement in cough, dyspnea, sputum production She has tried the Trelegy but did not note any difference.  She stopped taking it as it cost $200  Has seen speech pathology for modified barium swallow which showed mild aspiration risk.  Outpatient Encounter Medications as of 08/21/2019  Medication Sig  . ALPRAZolam (XANAX) 0.25 MG tablet Take 1 tablet (0.25 mg total) by mouth 2 (two) times daily as needed for anxiety.  . fluticasone (FLONASE) 50 MCG/ACT nasal spray Place 2 sprays into both nostrils daily.  [name] gabapentin (NEURONTIN) 100 MG capsule Take 1 capsule (100 mg total) by mouth 3 (three) times daily.  [name] levothyroxine  (SYNTHROID) 75 MCG tablet Take 1 tablet (75 mcg total) by mouth daily.  . Omega-3 Fatty Acids (FISH OIL) 1200 MG CAPS Take 1,200 mg by mouth daily.  [name] rOPINIRole (REQUIP) 0.25 MG tablet Take 1 tablet (0.25 mg total) by mouth 3 (three) times daily.  . sertraline (ZOLOFT) 50 MG tablet Take 1 tablet (50 mg total) by mouth daily.  [name] zolpidem (AMBIEN) 10 MG tablet Take 1 tablet (10 mg total) by mouth at bedtime as needed. for sleep  . [DISCONTINUED] albuterol (VENTOLIN HFA) 108 (90 Base) MCG/ACT inhaler 2 PUFFS EVERY 6 HOURS AS NEEDED FOR WHEEZING OR SHORTNESS OF BREATH  . [DISCONTINUED] Fluticasone-Umeclidin-Vilant (TRELEGY ELLIPTA) 100-62.5-25 MCG/INH AEPB Inhale 1 puff into the lungs daily.  . [DISCONTINUED] Fluticasone-Umeclidin-Vilant (TRELEGY ELLIPTA) 100-62.5-25 MCG/INH AEPB Inhale 1 puff into the lungs daily.  . [DISCONTINUED] letrozole (FEMARA) 2.5 MG tablet Take 1 tablet (2.5 mg total) by mouth daily. Take 1/2 tab daily   Facility-Administered Encounter Medications as of 08/21/2019  Medication  . cyanocobalamin ((VITAMIN B-12)) injection 1,000 mcg    Allergies as of 08/21/2019 - Review Complete 08/21/2019  Allergen Reaction Noted  . Amoxicillin-pot clavulanate Nausea And Vomiting and Other (See Comments) 11/10/2014   Physical Exam: Blood pressure 120/74, pulse 75, temperature (!) 97.3 F (36.3 C), temperature source Temporal, height 5\' 4"  (1.626 m), weight 165 lb 3.2 oz (74.9 kg), SpO2 98 %. Gen:      No acute distress HEENT:  EOMI, sclera anicteric Neck:     No masses; no thyromegaly Lungs:    Clear to auscultation bilaterally; normal respiratory effort CV:  Regular rate and rhythm; no murmurs Abd:      + bowel sounds; soft, non-tender; no palpable masses, no distension Ext:    No edema; adequate peripheral perfusion Skin:      Warm and dry; no rash Neuro: alert and oriented x 3 Psych: normal mood and affect  Data Reviewed: Imaging: Chest x-ray 01/25/2019- right midlung  opacity Chest x-ray 03/06/2019- new right lower lobe airspace disease Chest x-ray 03/21/2019- improved right lower lobe opacity Chest x-ray 04/18/2019- resolution of right lower lobe opacity.  New opacity in the right upper lobe concerning for pneumonia CT chest 04/13/2019- right upper lobe airspace opacity concerning for pneumonia.  Posterior left lower lobe opacity.  Possible underlying scarring. 8 mm nodule in the left upper lobe. CT chest 08/12/2019-resolved areas of consolidation.  New small band of consolidation in the anterior left lower lobe.  Mild to moderate emphysema, stable radiation fibrosis.  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  Modified barium swallow 05/14/2019-mild aspiration risk with mild inconsistent laryngeal penetration which cleared with reflexive swallow.  Labs: CBC 05/02/2019-WBC 7.3, eos 6%, absolute eosinophil count 438  Assessment:  Reccurent Pneumonia Differential diagnosis is COPD, aspiration pneumonia She is convinced that she had a Covid 51 back in February which may well be true. Less likely to be radiation pneumonitis as the airspace opacities show up in different areas of the lung. Scoliosis is a chronic condition and may be contributing to hypoventilation and lung restriction  Does not need Trelegy inhaler as symptoms are getting better Will schedule pulmonary function test and follow-up CT chest in 6 months time to continue monitoring Advised to continue aspiration precautions.  Plan/Recommendations: - PFTs, CT chest in 6 months - Aspiration precautions.  Marshell Garfinkel MD [facility] 08/21/2019, 9:37 AM  CC: Hassell Done, Mary-Margaret, *

## 2019-08-23 ENCOUNTER — Other Ambulatory Visit: Payer: Self-pay | Admitting: Hematology and Oncology

## 2019-08-30 ENCOUNTER — Other Ambulatory Visit: Payer: Self-pay

## 2019-09-02 ENCOUNTER — Other Ambulatory Visit: Payer: Self-pay

## 2019-09-02 ENCOUNTER — Ambulatory Visit (INDEPENDENT_AMBULATORY_CARE_PROVIDER_SITE_OTHER): Payer: Medicare HMO | Admitting: *Deleted

## 2019-09-02 DIAGNOSIS — E538 Deficiency of other specified B group vitamins: Secondary | ICD-10-CM

## 2019-09-16 ENCOUNTER — Telehealth: Payer: Self-pay | Admitting: Hematology and Oncology

## 2019-09-16 NOTE — Telephone Encounter (Signed)
Returned patient's phone call regarding 01/19 appointment, per patient's request appointment has been cancelled.

## 2019-09-30 ENCOUNTER — Other Ambulatory Visit: Payer: Self-pay

## 2019-09-30 ENCOUNTER — Ambulatory Visit (INDEPENDENT_AMBULATORY_CARE_PROVIDER_SITE_OTHER): Payer: Medicare HMO | Admitting: *Deleted

## 2019-09-30 DIAGNOSIS — E538 Deficiency of other specified B group vitamins: Secondary | ICD-10-CM | POA: Diagnosis not present

## 2019-10-01 ENCOUNTER — Ambulatory Visit: Payer: Medicare HMO | Admitting: Hematology and Oncology

## 2019-10-07 ENCOUNTER — Other Ambulatory Visit: Payer: Self-pay

## 2019-10-08 ENCOUNTER — Ambulatory Visit (INDEPENDENT_AMBULATORY_CARE_PROVIDER_SITE_OTHER): Payer: Medicare HMO | Admitting: Nurse Practitioner

## 2019-10-08 ENCOUNTER — Encounter: Payer: Self-pay | Admitting: Nurse Practitioner

## 2019-10-08 VITALS — BP 139/82 | HR 76 | Temp 98.0°F | Resp 20 | Ht 64.0 in | Wt 173.0 lb

## 2019-10-08 DIAGNOSIS — M80079D Age-related osteoporosis with current pathological fracture, unspecified ankle and foot, subsequent encounter for fracture with routine healing: Secondary | ICD-10-CM

## 2019-10-08 DIAGNOSIS — C50211 Malignant neoplasm of upper-inner quadrant of right female breast: Secondary | ICD-10-CM | POA: Diagnosis not present

## 2019-10-08 DIAGNOSIS — I10 Essential (primary) hypertension: Secondary | ICD-10-CM | POA: Diagnosis not present

## 2019-10-08 DIAGNOSIS — Z23 Encounter for immunization: Secondary | ICD-10-CM | POA: Diagnosis not present

## 2019-10-08 DIAGNOSIS — E034 Atrophy of thyroid (acquired): Secondary | ICD-10-CM | POA: Diagnosis not present

## 2019-10-08 DIAGNOSIS — Z6831 Body mass index (BMI) 31.0-31.9, adult: Secondary | ICD-10-CM

## 2019-10-08 DIAGNOSIS — G629 Polyneuropathy, unspecified: Secondary | ICD-10-CM

## 2019-10-08 DIAGNOSIS — E785 Hyperlipidemia, unspecified: Secondary | ICD-10-CM | POA: Diagnosis not present

## 2019-10-08 DIAGNOSIS — F411 Generalized anxiety disorder: Secondary | ICD-10-CM

## 2019-10-08 DIAGNOSIS — F3342 Major depressive disorder, recurrent, in full remission: Secondary | ICD-10-CM

## 2019-10-08 DIAGNOSIS — Z170 Estrogen receptor positive status [ER+]: Secondary | ICD-10-CM

## 2019-10-08 DIAGNOSIS — D649 Anemia, unspecified: Secondary | ICD-10-CM

## 2019-10-08 DIAGNOSIS — G2581 Restless legs syndrome: Secondary | ICD-10-CM | POA: Diagnosis not present

## 2019-10-08 DIAGNOSIS — R69 Illness, unspecified: Secondary | ICD-10-CM | POA: Diagnosis not present

## 2019-10-08 DIAGNOSIS — F5101 Primary insomnia: Secondary | ICD-10-CM

## 2019-10-08 MED ORDER — ROPINIROLE HCL 0.25 MG PO TABS: 0.2500 mg | ORAL_TABLET | Freq: Three times a day (TID) | ORAL | 1 refills | Status: DC

## 2019-10-08 MED ORDER — SERTRALINE HCL 50 MG PO TABS: 50.0000 mg | ORAL_TABLET | Freq: Every day | ORAL | 1 refills | Status: DC

## 2019-10-08 MED ORDER — LEVOTHYROXINE SODIUM 75 MCG PO TABS: 75.0000 ug | ORAL_TABLET | Freq: Every day | ORAL | 1 refills | Status: DC

## 2019-10-08 MED ORDER — GABAPENTIN 100 MG PO CAPS: 100.0000 mg | ORAL_CAPSULE | Freq: Three times a day (TID) | ORAL | 1 refills | Status: DC

## 2019-10-08 MED ORDER — ALPRAZOLAM 0.25 MG PO TABS: 0.2500 mg | ORAL_TABLET | Freq: Two times a day (BID) | ORAL | 5 refills | Status: DC | PRN

## 2019-10-08 MED ORDER — ZOLPIDEM TARTRATE 10 MG PO TABS: 10.0000 mg | ORAL_TABLET | Freq: Every evening | ORAL | 5 refills | Status: DC | PRN

## 2019-10-08 NOTE — Patient Instructions (Signed)

## 2019-10-08 NOTE — Progress Notes (Signed)
Subjective:    Patient ID: Phyllis Walker, female    DOB: Feb 04, 1946, 74 y.o.   MRN: [id number]   Chief Complaint: medical management of chronic issues   HPI:  1. Hypertension, benign essential, goal below 140/90 No c/o chest pain, sob or headache. Has not been checking blood pressure at home. BP Readings from Last 3 Encounters:  08/21/19 120/74  05/08/19 120/64  05/02/19 110/70     2. Hyperlipidemia with target LDL less than 100 Does not watch diet and does no exercise Lab Results  Component Value Date   CHOL 181 04/04/2019   HDL 49 04/04/2019   LDLCALC 113 (H) 04/04/2019   TRIG 97 04/04/2019   CHOLHDL 3.7 04/04/2019     3. Hypothyroidism due to acquired atrophy of thyroid Having no problems that she is aware of. Lab Results  Component Value Date   TSH 0.576 04/04/2019     4. Malignant neoplasm of upper-inner quadrant of right breast in female, estrogen receptor positive ([place]) She is done with all of her radiation treatments. Sees oncology yearly right now.  5. Restless leg syndrome Takes requip at night which she says helps calm her legs down so they are not moving all night long.  6. Neuropathy bil feet. burna and ache all the time. Takes gabapentin which helps.  7. Osteoporosis with pathological fracture of ankle and foot, unspecified laterality, with routine healing, subsequent encounter Last dexascan was done in 2015. Needs to be repeated. Refuses repeat bone density test.  8. GAD (generalized anxiety disorder) She has xanax 0.10m BID prn. Says she needs to take BID. GAD 7 : Generalized Anxiety Score 12/27/2016  Nervous, Anxious, on Edge 0  Control/stop worrying 1  Worry too much - different things 1  Trouble relaxing 0  Restless 0  Easily annoyed or irritable 0  Afraid - awful might happen 0  Total GAD 7 Score 2  Anxiety Difficulty Not difficult at all      9. Recurrent major depressive disorder, in full remission ([facility] Patient is on zoloft  daily and is doing well. [facility] 10/08/2019 05/02/2019 04/18/2019  Decreased Interest 0 0 0  Down, Depressed, Hopeless 0 0 0  PHQ - 2 Score 0 0 0  Altered sleeping - - -  Tired, decreased energy - - -  Change in appetite - - -  Feeling bad or failure about yourself  - - -  Trouble concentrating - - -  Moving slowly or fidgety/restless - - -  Suicidal thoughts - - -  PHQ-9 Score - - -  Difficult doing work/chores - - -  Some recent data might be hidden    10. Primary insomnia Is on ambien nightly and says she sleeps well.  11. Anemia, unspecified type Has some fatigue. Lab Results  Component Value Date   HGB 10.8 (L) 05/02/2019     12. BMI 31.0-31.9,adult Weight is up 8 lbs since last visit Wt Readings from Last 3 Encounters:  08/21/19 165 lb 3.2 oz (74.9 kg)  05/08/19 143 lb 9.6 oz (65.1 kg)  05/02/19 143 lb (64.9 kg)   BMI Readings from Last 3 Encounters:  08/21/19 28.36 kg/m  05/08/19 24.65 kg/m  05/02/19 24.55 kg/m       Outpatient Encounter Medications as of 10/08/2019  Medication Sig  . ALPRAZolam (XANAX) 0.25 MG tablet Take 1 tablet (0.25 mg total) by mouth 2 (two) times daily as needed for anxiety.  . fluticasone (FLONASE) 50  MCG/ACT nasal spray Place 2 sprays into both nostrils daily.  [name] gabapentin (NEURONTIN) 100 MG capsule Take 1 capsule (100 mg total) by mouth 3 (three) times daily.  [name] letrozole (FEMARA) 2.5 MG tablet TAKE 1 TABLET DAILY  . levothyroxine (SYNTHROID) 75 MCG tablet Take 1 tablet (75 mcg total) by mouth daily.  . Omega-3 Fatty Acids (FISH OIL) 1200 MG CAPS Take 1,200 mg by mouth daily.  [name] rOPINIRole (REQUIP) 0.25 MG tablet Take 1 tablet (0.25 mg total) by mouth 3 (three) times daily.  . sertraline (ZOLOFT) 50 MG tablet Take 1 tablet (50 mg total) by mouth daily.  [name] zolpidem (AMBIEN) 10 MG tablet Take 1 tablet (10 mg total) by mouth at bedtime as needed. for sleep   Facility-Administered Encounter Medications as of  10/08/2019  Medication  . cyanocobalamin ((VITAMIN B-12)) injection 1,000 mcg    Past Surgical History:  Procedure Laterality Date  . BREAST LUMPECTOMY  1987   benign fibroid tumor  . BREAST LUMPECTOMY WITH RADIOACTIVE SEED AND SENTINEL LYMPH NODE BIOPSY Right 07/05/2018   Procedure: RIGHT BREAST LUMPECTOMY WITH RADIOACTIVE SEED X 2 AND RIGHT AXILLARY SENTINEL LYMPH NODE BIOPSY;  Surgeon: Alphonsa Overall, MD;  Location: [place];  Service: General;  Laterality: Right;  . BUNIONECTOMY Bilateral   . CATARACT EXTRACTION    . COLONOSCOPY    . THYROIDECTOMY, PARTIAL  1969  . TONSILLECTOMY    . TOTAL KNEE ARTHROPLASTY Right 08/15/2016   Procedure: TOTAL KNEE ARTHROPLASTY;  Surgeon: Vickey Huger, MD;  Location: [place];  Service: Orthopedics;  Laterality: Right;  RNFA    Family History  Problem Relation Age of Onset  . Cancer Mother        pancreas  . Heart attack Father   . Hypertension Sister   . Hyperlipidemia Sister   . Anuerysm Sister   . Hypertension Sister   . Hyperlipidemia Sister   . Early death Sister 40       accident  . Ovarian cancer Maternal Aunt     New complaints: None today  Social history: Lives by herself  Controlled substance contract: 10/08/19    Review of Systems  Constitutional: Negative for diaphoresis.  Eyes: Negative for pain.  Respiratory: Negative for shortness of breath.   Cardiovascular: Negative for chest pain, palpitations and leg swelling.  Gastrointestinal: Negative for abdominal pain.  Endocrine: Negative for polydipsia.  Skin: Negative for rash.  Neurological: Negative for dizziness, weakness and headaches.  Hematological: Does not bruise/bleed easily.  All other systems reviewed and are negative.      Objective:   Physical Exam Vitals and nursing note reviewed.  Constitutional:      General: She is not in acute distress.    Appearance: Normal appearance. She is well-developed.  HENT:     Head: Normocephalic.      Nose: Nose normal.  Eyes:     Pupils: Pupils are equal, round, and reactive to light.  Neck:     Vascular: No carotid bruit or JVD.  Cardiovascular:     Rate and Rhythm: Normal rate and regular rhythm.     Heart sounds: Normal heart sounds.  Pulmonary:     Effort: Pulmonary effort is normal. No respiratory distress.     Breath sounds: Normal breath sounds. No wheezing or rales.  Chest:     Chest wall: No tenderness.  Abdominal:     General: Bowel sounds are normal. There is no distension or abdominal bruit.  Palpations: Abdomen is soft. There is no hepatomegaly, splenomegaly, mass or pulsatile mass.     Tenderness: There is no abdominal tenderness.  Musculoskeletal:        General: Normal range of motion.     Cervical back: Normal range of motion and neck supple.  Lymphadenopathy:     Cervical: No cervical adenopathy.  Skin:    General: Skin is warm and dry.  Neurological:     Mental Status: She is alert and oriented to person, place, and time.     Deep Tendon Reflexes: Reflexes are normal and symmetric.  Psychiatric:        Behavior: Behavior normal.        Thought Content: Thought content normal.        Judgment: Judgment normal.     BP 139/82   Pulse 76   Temp 98 F (36.7 C) (Temporal)   Resp 20   Ht '5\' 4"'  (1.626 m)   Wt 173 lb (78.5 kg)   SpO2 100%   BMI 29.70 kg/m        Assessment & Plan:  Phyllis Walker comes in today with chief complaint of Medical Management of Chronic Issues   Diagnosis and orders addressed:  1. Hypertension, benign essential, goal below 140/90 Low sodium diet - CMP14+EGFR  2. Hyperlipidemia with target LDL less than 100 Low fat diet - Lipid panel  3. Hypothyroidism due to acquired atrophy of thyroid - levothyroxine (SYNTHROID) 75 MCG tablet; Take 1 tablet (75 mcg total) by mouth daily.  Dispense: 90 tablet; Refill: 1 - Thyroid Panel With TSH  4. Malignant neoplasm of upper-inner quadrant of right breast in female,  estrogen receptor positive ([place]) Keep follow up with oncology  5. Restless leg syndrome Keep legs warm at night - rOPINIRole (REQUIP) 0.25 MG tablet; Take 1 tablet (0.25 mg total) by mouth 3 (three) times daily.  Dispense: 270 tablet; Refill: 1  6. Neuropathy Do not go barefooted - gabapentin (NEURONTIN) 100 MG capsule; Take 1 capsule (100 mg total) by mouth 3 (three) times daily.  Dispense: 270 capsule; Refill: 1  7. Osteoporosis with pathological fracture of ankle and foot, unspecified laterality, with routine healing, subsequent encounter Weight bearing exercises Refuses dexascan  8. GAD (generalized anxiety disorder) Stress management - ALPRAZolam (XANAX) 0.25 MG tablet; Take 1 tablet (0.25 mg total) by mouth 2 (two) times daily as needed for anxiety.  Dispense: 60 tablet; Refill: 5  9. Recurrent major depressive disorder, in full remission ([place]) - sertraline (ZOLOFT) 50 MG tablet; Take 1 tablet (50 mg total) by mouth daily.  Dispense: 90 tablet; Refill: 1  10. Primary insomnia Bedtime routine - zolpidem (AMBIEN) 10 MG tablet; Take 1 tablet (10 mg total) by mouth at bedtime as needed. for sleep  Dispense: 30 tablet; Refill: 5  11. Anemia, unspecified type Labs pending  12. BMI 31.0-31.9,adult Discussed diet and exercise for person with BMI >25 Will recheck weight in 3-6 months    Labs pending Health Maintenance reviewed Diet and exercise encouraged  Follow up plan: 6 months   Mary-Margaret Hassell Done, FNP

## 2019-10-09 LAB — CMP14+EGFR
ALT: 9 IU/L (ref 0–32)
AST: 16 IU/L (ref 0–40)
Albumin/Globulin Ratio: 1.8 (ref 1.2–2.2)
Albumin: 4.2 g/dL (ref 3.7–4.7)
Alkaline Phosphatase: 109 IU/L (ref 39–117)
BUN/Creatinine Ratio: 22 (ref 12–28)
BUN: 15 mg/dL (ref 8–27)
Bilirubin Total: 0.3 mg/dL (ref 0.0–1.2)
CO2: 24 mmol/L (ref 20–29)
Calcium: 9.2 mg/dL (ref 8.7–10.3)
Chloride: 105 mmol/L (ref 96–106)
Creatinine, Ser: 0.67 mg/dL (ref 0.57–1.00)
GFR calc Af Amer: 100 mL/min/{1.73_m2} (ref >59)
GFR calc non Af Amer: 87 mL/min/{1.73_m2} (ref >59)
Globulin, Total: 2.3 g/dL (ref 1.5–4.5)
Glucose: 92 mg/dL (ref 65–99)
Potassium: 4.4 mmol/L (ref 3.5–5.2)
Sodium: 142 mmol/L (ref 134–144)
Total Protein: 6.5 g/dL (ref 6.0–8.5)

## 2019-10-09 LAB — CBC WITH DIFFERENTIAL/PLATELET
Basophils Absolute: 0.1 10*3/uL (ref 0.0–0.2)
Basos: 2 %
EOS (ABSOLUTE): 0.3 10*3/uL (ref 0.0–0.4)
Eos: 5 %
Hematocrit: 32.1 % — ABNORMAL LOW (ref 34.0–46.6)
Hemoglobin: 10.1 g/dL — ABNORMAL LOW (ref 11.1–15.9)
Immature Grans (Abs): 0 10*3/uL (ref 0.0–0.1)
Immature Granulocytes: 0 %
Lymphocytes Absolute: 1.4 10*3/uL (ref 0.7–3.1)
Lymphs: 25 %
MCH: 25.8 pg — ABNORMAL LOW (ref 26.6–33.0)
MCHC: 31.5 g/dL (ref 31.5–35.7)
MCV: 82 fL (ref 79–97)
Monocytes Absolute: 0.4 10*3/uL (ref 0.1–0.9)
Monocytes: 8 %
Neutrophils Absolute: 3.2 10*3/uL (ref 1.4–7.0)
Neutrophils: 60 %
Platelets: 240 10*3/uL (ref 150–450)
RBC: 3.91 x10E6/uL (ref 3.77–5.28)
RDW: 13.9 % (ref 11.7–15.4)
WBC: 5.4 10*3/uL (ref 3.4–10.8)

## 2019-10-09 LAB — LIPID PANEL
Chol/HDL Ratio: 3.4 ratio (ref 0.0–4.4)
Cholesterol, Total: 217 mg/dL — ABNORMAL HIGH (ref 100–199)
HDL: 64 mg/dL (ref >39)
LDL Chol Calc (NIH): 139 mg/dL — ABNORMAL HIGH (ref 0–99)
Triglycerides: 78 mg/dL (ref 0–149)
VLDL Cholesterol Cal: 14 mg/dL (ref 5–40)

## 2019-10-09 LAB — THYROID PANEL WITH TSH
Free Thyroxine Index: 2.4 (ref 1.2–4.9)
T3 Uptake Ratio: 25 % (ref 24–39)
T4, Total: 9.4 ug/dL (ref 4.5–12.0)
TSH: 3.88 u[IU]/mL (ref 0.450–4.500)

## 2019-10-22 ENCOUNTER — Other Ambulatory Visit: Payer: Medicare HMO

## 2019-10-22 ENCOUNTER — Other Ambulatory Visit: Payer: Self-pay

## 2019-10-22 DIAGNOSIS — D649 Anemia, unspecified: Secondary | ICD-10-CM

## 2019-10-22 DIAGNOSIS — R300 Dysuria: Secondary | ICD-10-CM | POA: Diagnosis not present

## 2019-10-22 LAB — CBC WITH DIFFERENTIAL/PLATELET
Basophils Absolute: 0.1 10*3/uL (ref 0.0–0.2)
Basos: 2 %
EOS (ABSOLUTE): 0.3 10*3/uL (ref 0.0–0.4)
Eos: 5 %
Hematocrit: 33 % — ABNORMAL LOW (ref 34.0–46.6)
Hemoglobin: 10.3 g/dL — ABNORMAL LOW (ref 11.1–15.9)
Immature Grans (Abs): 0 10*3/uL (ref 0.0–0.1)
Immature Granulocytes: 0 %
Lymphocytes Absolute: 1.4 10*3/uL (ref 0.7–3.1)
Lymphs: 24 %
MCH: 25.9 pg — ABNORMAL LOW (ref 26.6–33.0)
MCHC: 31.2 g/dL — ABNORMAL LOW (ref 31.5–35.7)
MCV: 83 fL (ref 79–97)
Monocytes Absolute: 0.4 10*3/uL (ref 0.1–0.9)
Monocytes: 7 %
Neutrophils Absolute: 3.6 10*3/uL (ref 1.4–7.0)
Neutrophils: 62 %
Platelets: 214 10*3/uL (ref 150–450)
RBC: 3.98 x10E6/uL (ref 3.77–5.28)
RDW: 15.8 % — ABNORMAL HIGH (ref 11.7–15.4)
WBC: 5.8 10*3/uL (ref 3.4–10.8)

## 2019-10-22 LAB — URINALYSIS, COMPLETE
Bilirubin, UA: NEGATIVE
Glucose, UA: NEGATIVE
Ketones, UA: NEGATIVE
Nitrite, UA: POSITIVE — AB
Specific Gravity, UA: 1.02 (ref 1.005–1.030)
Urobilinogen, Ur: 1 mg/dL (ref 0.2–1.0)
pH, UA: 7 (ref 5.0–7.5)

## 2019-10-22 LAB — MICROSCOPIC EXAMINATION

## 2019-10-25 LAB — URINE CULTURE

## 2019-10-26 MED ORDER — SULFAMETHOXAZOLE-TRIMETHOPRIM 800-160 MG PO TABS: 1.0000 | ORAL_TABLET | Freq: Two times a day (BID) | ORAL | 0 refills | Status: DC

## 2019-10-29 ENCOUNTER — Other Ambulatory Visit: Payer: Self-pay | Admitting: Nurse Practitioner

## 2019-10-29 MED ORDER — CEPHALEXIN 500 MG PO CAPS: 500.0000 mg | ORAL_CAPSULE | Freq: Two times a day (BID) | ORAL | 0 refills | Status: DC

## 2019-11-01 ENCOUNTER — Ambulatory Visit: Payer: Medicare HMO

## 2019-11-01 ENCOUNTER — Other Ambulatory Visit: Payer: Self-pay

## 2019-11-04 ENCOUNTER — Other Ambulatory Visit: Payer: Self-pay

## 2019-11-04 ENCOUNTER — Ambulatory Visit (INDEPENDENT_AMBULATORY_CARE_PROVIDER_SITE_OTHER): Payer: Medicare HMO

## 2019-11-04 DIAGNOSIS — E538 Deficiency of other specified B group vitamins: Secondary | ICD-10-CM

## 2019-11-05 DIAGNOSIS — Z23 Encounter for immunization: Secondary | ICD-10-CM | POA: Diagnosis not present

## 2019-11-21 ENCOUNTER — Other Ambulatory Visit: Payer: Self-pay | Admitting: Hematology and Oncology

## 2019-11-21 ENCOUNTER — Telehealth: Payer: Self-pay | Admitting: Hematology and Oncology

## 2019-11-21 NOTE — Telephone Encounter (Signed)
Scheduled appt per 3/11 sch message - unable to reach pt - left message with appt date and time

## 2019-11-28 DIAGNOSIS — R69 Illness, unspecified: Secondary | ICD-10-CM | POA: Diagnosis not present

## 2019-12-03 ENCOUNTER — Ambulatory Visit (INDEPENDENT_AMBULATORY_CARE_PROVIDER_SITE_OTHER): Payer: Medicare HMO | Admitting: *Deleted

## 2019-12-03 ENCOUNTER — Other Ambulatory Visit: Payer: Self-pay

## 2019-12-03 VITALS — BP 123/69 | HR 78

## 2019-12-03 DIAGNOSIS — E538 Deficiency of other specified B group vitamins: Secondary | ICD-10-CM | POA: Diagnosis not present

## 2019-12-03 NOTE — Progress Notes (Signed)
B12 injection given IM left deltoid and tolerated well.

## 2019-12-03 NOTE — Patient Instructions (Signed)

## 2019-12-22 NOTE — Progress Notes (Signed)
Patient Care Team: Chevis Pretty, FNP as PCP - General (Nurse Practitioner) Vickey Huger, MD as Consulting Physician (Orthopedic Surgery) Huel Cote, NP as Nurse Practitioner (Obstetrics and Gynecology) Nicholas Lose, MD as Consulting Physician (Hematology and Oncology) Alphonsa Overall, MD as Consulting Physician (General Surgery) Kyung Rudd, MD as Consulting Physician (Radiation Oncology)  DIAGNOSIS:    ICD-10-CM   1. Malignant neoplasm of upper-inner quadrant of right breast in female, estrogen receptor positive ([place])  C50.211    Z17.0     SUMMARY OF ONCOLOGIC HISTORY: Oncology History  Malignant neoplasm of upper-inner quadrant of right breast in female, estrogen receptor positive ([place])  04/25/2018 Initial Diagnosis   Abnormal mammogram on 04/17/2018, biopsy revealed invasive lobular cancer grade 2, ER 90%, PR 0%, Ki-67 10%, HER-2 negative, breast MRI revealed 8 mm biopsy-proven malignancy UIQ, additional findings 1.2 cm linear enhancement retroareolar, 8 mm enhancing mass posterior upper central right breast, 1.3 cm linear enhancement upper inner left breast, no abnormal lymph nodes, T1 b N0 stage Ia   05/31/2018 Cancer Staging   Staging form: Breast, AJCC 8th Edition - Clinical: Stage IA (cT1b(3), cN0, cM0, G2, ER+, PR-, HER2-) - Signed by Nicholas Lose, MD on 05/31/2018   07/05/2018 Surgery   Right lumpectomy: Invasive lobular cancer, grade 2, 0.8 cm, ALH, margins negative, 0/3 lymph nodes negative ER 90%, PR 0%, HER-2 negative, Ki-67 10%, T1 be N0 stage Ia   07/19/2018 Oncotype testing   Oncotype DX recurrence score 24, risk of distant recurrence of 9 years: 10%, low risk   07/25/2018 Cancer Staging   Staging form: Breast, AJCC 8th Edition - Pathologic: Stage IA (pT1b, pN0, cM0, G2, ER+, PR-, HER2-, Oncotype DX score: 24) - Signed by Gardenia Phlegm, NP on 07/25/2018   09/07/2018 - 10/03/2018 Radiation Therapy   Adjuvant XRT at [facility]   09/2018 -   Anti-estrogen oral therapy   Letrozole half of 2.57m tablet daily     CHIEF COMPLIANT: Follow-up of right breast cancer on letrozole  INTERVAL HISTORY: Phyllis ECTORis a 74y.o. with above-mentioned history of right breast cancer treated with lumpectomy, radiation, and who is currently on antiestrogen therapy with letrozole. She presents to the clinic today for follow-up.   ALLERGIES:  is allergic to amoxicillin-pot clavulanate.  MEDICATIONS:  Current Outpatient Medications  Medication Sig Dispense Refill  . ALPRAZolam (XANAX) 0.25 MG tablet Take 1 tablet (0.25 mg total) by mouth 2 (two) times daily as needed for anxiety. 60 tablet 5  . cephALEXin (KEFLEX) 500 MG capsule Take 1 capsule (500 mg total) by mouth 2 (two) times daily. 14 capsule 0  . fluticasone (FLONASE) 50 MCG/ACT nasal spray Place 2 sprays into both nostrils daily. 16 g 6  . gabapentin (NEURONTIN) 100 MG capsule Take 1 capsule (100 mg total) by mouth 3 (three) times daily. 270 capsule 1  . letrozole (FEMARA) 2.5 MG tablet TAKE 1 TABLET DAILY 90 tablet 0  . levothyroxine (SYNTHROID) 75 MCG tablet Take 1 tablet (75 mcg total) by mouth daily. 90 tablet 1  . Omega-3 Fatty Acids (FISH OIL) 1200 MG CAPS Take 1,200 mg by mouth daily.    . Red Yeast Rice Extract (RED YEAST RICE PO) Take by mouth.    .Marland KitchenrOPINIRole (REQUIP) 0.25 MG tablet Take 1 tablet (0.25 mg total) by mouth 3 (three) times daily. 270 tablet 1  . sertraline (ZOLOFT) 50 MG tablet Take 1 tablet (50 mg total) by mouth daily. 90 tablet 1  .  sulfamethoxazole-trimethoprim (BACTRIM DS) 800-160 MG tablet Take 1 tablet by mouth 2 (two) times daily. 14 tablet 0  . zolpidem (AMBIEN) 10 MG tablet Take 1 tablet (10 mg total) by mouth at bedtime as needed. for sleep 30 tablet 5   Current Facility-Administered Medications  Medication Dose Route Frequency Provider Last Rate Last Admin  . cyanocobalamin ((VITAMIN B-12)) injection 1,000 mcg  1,000 mcg Intramuscular Q30 days Chevis Pretty, FNP   1,000 mcg at 12/03/19 1104    PHYSICAL EXAMINATION: ECOG PERFORMANCE STATUS: 1 - Symptomatic but completely ambulatory  There were no vitals filed for this visit. There were no vitals filed for this visit.  BREAST: No palpable masses or nodules in either right or left breasts. No palpable axillary supraclavicular or infraclavicular adenopathy no breast tenderness or nipple discharge. (exam performed in the presence of a chaperone)  LABORATORY DATA:  I have reviewed the data as listed CMP Latest Ref Rng & Units 10/08/2019 05/02/2019 04/04/2019  Glucose 65 - 99 mg/dL 92 85 115(H)  BUN 8 - 27 mg/dL '15 9 10  ' Creatinine 0.57 - 1.00 mg/dL 0.67 0.49(L) 0.63  Sodium 134 - 144 mmol/L 142 141 137  Potassium 3.5 - 5.2 mmol/L 4.4 4.0 3.7  Chloride 96 - 106 mmol/L 105 102 97  CO2 20 - 29 mmol/L '24 25 25  ' Calcium 8.7 - 10.3 mg/dL 9.2 8.6(L) 8.9  Total Protein 6.0 - 8.5 g/dL 6.5 - 5.9(L)  Total Bilirubin 0.0 - 1.2 mg/dL 0.3 - 0.3  Alkaline Phos 39 - 117 IU/L 109 - 107  AST 0 - 40 IU/L 16 - 13  ALT 0 - 32 IU/L 9 - 6    Lab Results  Component Value Date   WBC 5.8 10/22/2019   HGB 10.3 (L) 10/22/2019   HCT 33.0 (L) 10/22/2019   MCV 83 10/22/2019   PLT 214 10/22/2019   NEUTROABS 3.6 10/22/2019    ASSESSMENT & PLAN:  Malignant neoplasm of upper-inner quadrant of right breast in female, estrogen receptor positive ([place]) 07/05/2018:Right lumpectomy: Invasive lobular cancer, grade 2, 0.8 cm, ALH, margins negative, 0/3 lymph nodes negative ER 90%, PR 0%, HER-2 negative, Ki-67 10%, T1 be N0 stage Ia Oncotype DX recurrence score 24, risk of distant recurrence of 9 years: 10%, low risk Adjuvant radiation therapy in [place] completed 10/03/2018  Treatment plan: Antiestrogen therapy with letrozole 2.5 mg daily.  She started letrozole prior to surgery. Letrozole toxicities: Previously she had hair loss from letrozole. She is seeing someone and is very happy with her life. She  exercises regularly through dancing.  Mammograms were done in [place]. Breast exam 12/23/2019: Benign  Return to clinic in  1 year for follow-up  No orders of the defined types were placed in this encounter.  The patient has a good understanding of the overall plan. she agrees with it. she will call with any problems that may develop before the next visit here.  Total time spent: 30 mins including face to face time and time spent for planning, charting and coordination of care  Nicholas Lose, MD 12/23/2019  I, Cloyde Reams Dorshimer, am acting as scribe for Dr. Nicholas Lose.  I have reviewed the above documentation for accuracy and completeness, and I agree with the above.

## 2019-12-23 ENCOUNTER — Other Ambulatory Visit: Payer: Self-pay

## 2019-12-23 ENCOUNTER — Inpatient Hospital Stay: Payer: Medicare HMO | Attending: Hematology and Oncology | Admitting: Hematology and Oncology

## 2019-12-23 DIAGNOSIS — Z79899 Other long term (current) drug therapy: Secondary | ICD-10-CM | POA: Diagnosis not present

## 2019-12-23 DIAGNOSIS — Z79811 Long term (current) use of aromatase inhibitors: Secondary | ICD-10-CM | POA: Insufficient documentation

## 2019-12-23 DIAGNOSIS — R69 Illness, unspecified: Secondary | ICD-10-CM | POA: Diagnosis not present

## 2019-12-23 DIAGNOSIS — Z170 Estrogen receptor positive status [ER+]: Secondary | ICD-10-CM

## 2019-12-23 DIAGNOSIS — C50211 Malignant neoplasm of upper-inner quadrant of right female breast: Secondary | ICD-10-CM

## 2019-12-23 DIAGNOSIS — Z923 Personal history of irradiation: Secondary | ICD-10-CM | POA: Insufficient documentation

## 2019-12-23 MED ORDER — LETROZOLE 2.5 MG PO TABS: 2.5000 mg | ORAL_TABLET | Freq: Every day | ORAL | 3 refills | Status: DC

## 2019-12-23 NOTE — Assessment & Plan Note (Signed)
07/05/2018:Right lumpectomy: Invasive lobular cancer, grade 2, 0.8 cm, ALH, margins negative, 0/3 lymph nodes negative ER 90%, PR 0%, HER-2 negative, Ki-67 10%, T1 be N0 stage Ia Oncotype DX recurrence score 24, risk of distant recurrence of 9 years: 10%, low risk Adjuvant radiation therapy in [place] completed 10/03/2018  Treatment plan: Antiestrogen therapy with letrozole 2.5 mg daily.  She started letrozole prior to surgery. Letrozole toxicities: Previously she had hair loss from letrozole. I instructed her to take half a tablet daily.   Return to clinic in  1 year for follow-up

## 2019-12-25 DIAGNOSIS — Z87891 Personal history of nicotine dependence: Secondary | ICD-10-CM | POA: Diagnosis not present

## 2019-12-25 DIAGNOSIS — C50211 Malignant neoplasm of upper-inner quadrant of right female breast: Secondary | ICD-10-CM | POA: Diagnosis not present

## 2019-12-25 DIAGNOSIS — Z79811 Long term (current) use of aromatase inhibitors: Secondary | ICD-10-CM | POA: Diagnosis not present

## 2019-12-25 DIAGNOSIS — Z923 Personal history of irradiation: Secondary | ICD-10-CM | POA: Diagnosis not present

## 2019-12-25 DIAGNOSIS — Z170 Estrogen receptor positive status [ER+]: Secondary | ICD-10-CM | POA: Diagnosis not present

## 2019-12-26 ENCOUNTER — Telehealth: Payer: Self-pay | Admitting: Hematology and Oncology

## 2019-12-26 NOTE — Telephone Encounter (Signed)
Scheduled per 04/12 los, patient has been called and notified. ?

## 2020-01-03 ENCOUNTER — Other Ambulatory Visit: Payer: Self-pay

## 2020-01-03 ENCOUNTER — Ambulatory Visit (INDEPENDENT_AMBULATORY_CARE_PROVIDER_SITE_OTHER): Payer: Medicare HMO

## 2020-01-03 DIAGNOSIS — E538 Deficiency of other specified B group vitamins: Secondary | ICD-10-CM | POA: Diagnosis not present

## 2020-01-03 NOTE — Progress Notes (Signed)
Cyanocobalamin injection given to left deltoid.  Patient tolerated well. 

## 2020-01-21 DIAGNOSIS — R69 Illness, unspecified: Secondary | ICD-10-CM | POA: Diagnosis not present

## 2020-01-21 DIAGNOSIS — C50919 Malignant neoplasm of unspecified site of unspecified female breast: Secondary | ICD-10-CM | POA: Diagnosis not present

## 2020-01-21 DIAGNOSIS — G4700 Insomnia, unspecified: Secondary | ICD-10-CM | POA: Diagnosis not present

## 2020-01-21 DIAGNOSIS — Z87891 Personal history of nicotine dependence: Secondary | ICD-10-CM | POA: Diagnosis not present

## 2020-01-21 DIAGNOSIS — Z008 Encounter for other general examination: Secondary | ICD-10-CM | POA: Diagnosis not present

## 2020-01-21 DIAGNOSIS — G2581 Restless legs syndrome: Secondary | ICD-10-CM | POA: Diagnosis not present

## 2020-01-21 DIAGNOSIS — J309 Allergic rhinitis, unspecified: Secondary | ICD-10-CM | POA: Diagnosis not present

## 2020-01-21 DIAGNOSIS — E039 Hypothyroidism, unspecified: Secondary | ICD-10-CM | POA: Diagnosis not present

## 2020-01-21 DIAGNOSIS — Z79811 Long term (current) use of aromatase inhibitors: Secondary | ICD-10-CM | POA: Diagnosis not present

## 2020-02-03 ENCOUNTER — Ambulatory Visit (INDEPENDENT_AMBULATORY_CARE_PROVIDER_SITE_OTHER): Payer: Medicare HMO | Admitting: *Deleted

## 2020-02-03 ENCOUNTER — Other Ambulatory Visit: Payer: Self-pay

## 2020-02-03 DIAGNOSIS — E538 Deficiency of other specified B group vitamins: Secondary | ICD-10-CM

## 2020-02-03 NOTE — Progress Notes (Addendum)
Patient in today for B12 injection. 1000 mcg placed IM left deltoid. Patient tolerated well.

## 2020-02-19 ENCOUNTER — Other Ambulatory Visit: Payer: Self-pay

## 2020-02-19 ENCOUNTER — Ambulatory Visit (HOSPITAL_COMMUNITY)
Admission: RE | Admit: 2020-02-19 | Discharge: 2020-02-19 | Disposition: A | Discharge status: Home / Self Care | Payer: Medicare HMO | Source: Ambulatory Visit | Attending: Pulmonary Disease | Admitting: Pulmonary Disease

## 2020-02-19 DIAGNOSIS — J189 Pneumonia, unspecified organism: Secondary | ICD-10-CM

## 2020-03-06 ENCOUNTER — Other Ambulatory Visit: Payer: Self-pay

## 2020-03-06 ENCOUNTER — Ambulatory Visit (INDEPENDENT_AMBULATORY_CARE_PROVIDER_SITE_OTHER): Payer: Medicare HMO

## 2020-03-06 DIAGNOSIS — E538 Deficiency of other specified B group vitamins: Secondary | ICD-10-CM | POA: Diagnosis not present

## 2020-03-06 NOTE — Progress Notes (Signed)
Cyanocobalamin injection given to left deltoid.  Patient tolerated well. 

## 2020-03-09 ENCOUNTER — Other Ambulatory Visit: Payer: Self-pay

## 2020-03-09 ENCOUNTER — Ambulatory Visit (INDEPENDENT_AMBULATORY_CARE_PROVIDER_SITE_OTHER): Payer: Medicare HMO | Admitting: Nurse Practitioner

## 2020-03-09 ENCOUNTER — Encounter: Payer: Self-pay | Admitting: Nurse Practitioner

## 2020-03-09 VITALS — BP 144/77 | HR 78 | Temp 97.5°F | Resp 20 | Ht 64.0 in | Wt 181.0 lb

## 2020-03-09 DIAGNOSIS — I10 Essential (primary) hypertension: Secondary | ICD-10-CM

## 2020-03-09 MED ORDER — BENAZEPRIL-HYDROCHLOROTHIAZIDE 20-12.5 MG PO TABS: 0.5000 | ORAL_TABLET | Freq: Every day | ORAL | 1 refills | Status: DC

## 2020-03-09 NOTE — Patient Instructions (Signed)
DASH Eating Plan DASH stands for "Dietary Approaches to Stop Hypertension." The DASH eating plan is a healthy eating plan that has been shown to reduce high blood pressure (hypertension). It may also reduce your risk for type 2 diabetes, heart disease, and stroke. The DASH eating plan may also help with weight loss. What are tips for following this plan?  General guidelines  Avoid eating more than 2,300 mg (milligrams) of salt (sodium) a day. If you have hypertension, you may need to reduce your sodium intake to 1,500 mg a day.  Limit alcohol intake to no more than 1 drink a day for nonpregnant women and 2 drinks a day for men. One drink equals 12 oz of beer, 5 oz of wine, or 1 oz of hard liquor.  Work with your health care provider to maintain a healthy body weight or to lose weight. Ask what an ideal weight is for you.  Get at least 30 minutes of exercise that causes your heart to beat faster (aerobic exercise) most days of the week. Activities may include walking, swimming, or biking.  Work with your health care provider or diet and nutrition specialist (dietitian) to adjust your eating plan to your individual calorie needs. Reading food labels   Check food labels for the amount of sodium per serving. Choose foods with less than 5 percent of the Daily Value of sodium. Generally, foods with less than 300 mg of sodium per serving fit into this eating plan.  To find whole grains, look for the word "whole" as the first word in the ingredient list. Shopping  Buy products labeled as "low-sodium" or "no salt added."  Buy fresh foods. Avoid canned foods and premade or frozen meals. Cooking  Avoid adding salt when cooking. Use salt-free seasonings or herbs instead of table salt or sea salt. Check with your health care provider or pharmacist before using salt substitutes.  Do not fry foods. Cook foods using healthy methods such as baking, boiling, grilling, and broiling instead.  Cook with  heart-healthy oils, such as olive, canola, soybean, or sunflower oil. Meal planning  Eat a balanced diet that includes: ? 5 or more servings of fruits and vegetables each day. At each meal, try to fill half of your plate with fruits and vegetables. ? Up to 6-8 servings of whole grains each day. ? Less than 6 oz of lean meat, poultry, or fish each day. A 3-oz serving of meat is about the same size as a deck of cards. One egg equals 1 oz. ? 2 servings of low-fat dairy each day. ? A serving of nuts, seeds, or beans 5 times each week. ? Heart-healthy fats. Healthy fats called Omega-3 fatty acids are found in foods such as flaxseeds and coldwater fish, like sardines, salmon, and mackerel.  Limit how much you eat of the following: ? Canned or prepackaged foods. ? Food that is high in trans fat, such as fried foods. ? Food that is high in saturated fat, such as fatty meat. ? Sweets, desserts, sugary drinks, and other foods with added sugar. ? Full-fat dairy products.  Do not salt foods before eating.  Try to eat at least 2 vegetarian meals each week.  Eat more home-cooked food and less restaurant, buffet, and fast food.  When eating at a restaurant, ask that your food be prepared with less salt or no salt, if possible. What foods are recommended? The items listed may not be a complete list. Talk with your dietitian about   what dietary choices are best for you. Grains Whole-grain or whole-wheat bread. Whole-grain or whole-wheat pasta. Brown rice. Oatmeal. Quinoa. Bulgur. Whole-grain and low-sodium cereals. Pita bread. Low-fat, low-sodium crackers. Whole-wheat flour tortillas. Vegetables Fresh or frozen vegetables (raw, steamed, roasted, or grilled). Low-sodium or reduced-sodium tomato and vegetable juice. Low-sodium or reduced-sodium tomato sauce and tomato paste. Low-sodium or reduced-sodium canned vegetables. Fruits All fresh, dried, or frozen fruit. Canned fruit in natural juice (without  added sugar). Meat and other protein foods Skinless chicken or turkey. Ground chicken or turkey. Pork with fat trimmed off. Fish and seafood. Egg whites. Dried beans, peas, or lentils. Unsalted nuts, nut butters, and seeds. Unsalted canned beans. Lean cuts of beef with fat trimmed off. Low-sodium, lean deli meat. Dairy Low-fat (1%) or fat-free (skim) milk. Fat-free, low-fat, or reduced-fat cheeses. Nonfat, low-sodium ricotta or cottage cheese. Low-fat or nonfat yogurt. Low-fat, low-sodium cheese. Fats and oils Soft margarine without trans fats. Vegetable oil. Low-fat, reduced-fat, or light mayonnaise and salad dressings (reduced-sodium). Canola, safflower, olive, soybean, and sunflower oils. Avocado. Seasoning and other foods Herbs. Spices. Seasoning mixes without salt. Unsalted popcorn and pretzels. Fat-free sweets. What foods are not recommended? The items listed may not be a complete list. Talk with your dietitian about what dietary choices are best for you. Grains Baked goods made with fat, such as croissants, muffins, or some breads. Dry pasta or rice meal packs. Vegetables Creamed or fried vegetables. Vegetables in a cheese sauce. Regular canned vegetables (not low-sodium or reduced-sodium). Regular canned tomato sauce and paste (not low-sodium or reduced-sodium). Regular tomato and vegetable juice (not low-sodium or reduced-sodium). Pickles. Olives. Fruits Canned fruit in a light or heavy syrup. Fried fruit. Fruit in cream or butter sauce. Meat and other protein foods Fatty cuts of meat. Ribs. Fried meat. Bacon. Sausage. Bologna and other processed lunch meats. Salami. Fatback. Hotdogs. Bratwurst. Salted nuts and seeds. Canned beans with added salt. Canned or smoked fish. Whole eggs or egg yolks. Chicken or turkey with skin. Dairy Whole or 2% milk, cream, and half-and-half. Whole or full-fat cream cheese. Whole-fat or sweetened yogurt. Full-fat cheese. Nondairy creamers. Whipped toppings.  Processed cheese and cheese spreads. Fats and oils Butter. Stick margarine. Lard. Shortening. Ghee. Bacon fat. Tropical oils, such as coconut, palm kernel, or palm oil. Seasoning and other foods Salted popcorn and pretzels. Onion salt, garlic salt, seasoned salt, table salt, and sea salt. Worcestershire sauce. Tartar sauce. Barbecue sauce. Teriyaki sauce. Soy sauce, including reduced-sodium. Steak sauce. Canned and packaged gravies. Fish sauce. Oyster sauce. Cocktail sauce. Horseradish that you find on the shelf. Ketchup. Mustard. Meat flavorings and tenderizers. Bouillon cubes. Hot sauce and Tabasco sauce. Premade or packaged marinades. Premade or packaged taco seasonings. Relishes. Regular salad dressings. Where to find more information:  National Heart, Lung, and Blood Institute: www.nhlbi.nih.gov  American Heart Association: www.heart.org Summary  The DASH eating plan is a healthy eating plan that has been shown to reduce high blood pressure (hypertension). It may also reduce your risk for type 2 diabetes, heart disease, and stroke.  With the DASH eating plan, you should limit salt (sodium) intake to 2,300 mg a day. If you have hypertension, you may need to reduce your sodium intake to 1,500 mg a day.  When on the DASH eating plan, aim to eat more fresh fruits and vegetables, whole grains, lean proteins, low-fat dairy, and heart-healthy fats.  Work with your health care provider or diet and nutrition specialist (dietitian) to adjust your eating plan to your   individual calorie needs. This information is not intended to replace advice given to you by your health care provider. Make sure you discuss any questions you have with your health care provider. Document Revised: 08/11/2017 Document Reviewed: 08/22/2016 Elsevier Patient Education  2020 [employer] Inc.  

## 2020-03-09 NOTE — Progress Notes (Signed)
   Subjective:    Patient ID: Phyllis Walker, female    DOB: [birth year], 74 y.o.   MRN: [id number]   Chief Complaint: Restart BP   HPI Patient has been having problems with blood pressure being elevated. She has been checking blood pressure at home. Has been in 597-416'L systolic. She use to be on benazepril but stopped it because she had lost weight and was running low.     Review of Systems  Constitutional: Negative for diaphoresis.  Eyes: Negative for pain.  Respiratory: Negative for shortness of breath.   Cardiovascular: Negative for chest pain, palpitations and leg swelling.  Gastrointestinal: Negative for abdominal pain.  Endocrine: Positive for polyuria. Negative for polydipsia.  Skin: Negative for rash.  Neurological: Negative for dizziness, weakness and headaches.  Hematological: Does not bruise/bleed easily.  All other systems reviewed and are negative.      Objective:   Physical Exam Vitals and nursing note reviewed.  Constitutional:      General: She is not in acute distress.    Appearance: Normal appearance. She is well-developed.  Neck:     Vascular: No carotid bruit or JVD.  Cardiovascular:     Rate and Rhythm: Normal rate and regular rhythm.     Heart sounds: Normal heart sounds.  Pulmonary:     Effort: Pulmonary effort is normal. No respiratory distress.     Breath sounds: Normal breath sounds. No wheezing or rales.  Chest:     Chest wall: No tenderness.  Abdominal:     General: There is no distension or abdominal bruit.     Palpations: There is no hepatomegaly, splenomegaly, mass or pulsatile mass.     Tenderness: There is no abdominal tenderness.  Musculoskeletal:        General: Normal range of motion.     Cervical back: Normal range of motion and neck supple.  Lymphadenopathy:     Cervical: No cervical adenopathy.  Skin:    General: Skin is warm and dry.  Neurological:     Mental Status: She is alert and oriented to person, place, and time.      Deep Tendon Reflexes: Reflexes are normal and symmetric.  Psychiatric:        Behavior: Behavior normal.        Thought Content: Thought content normal.        Judgment: Judgment normal.    BP (!) 144/77   Pulse 78   Temp (!) 97.5 F (36.4 C) (Temporal)   Resp 20   Ht 5\' 4"  (1.626 m)   Wt 181 lb (82.1 kg)   SpO2 98%   BMI 31.07 kg/m         Assessment & Plan:  Phyllis Walker in today with chief complaint of Restart BP   1. 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  Dispense: 90 tablet; Refill: 1    The above assessment and management plan was discussed with the patient. The patient verbalized understanding of and has agreed to the management plan. Patient is aware to call the clinic if symptoms persist or worsen. Patient is aware when to return to the clinic for a follow-up visit. Patient educated on when it is appropriate to go to the emergency department.   Mary-Margaret Hassell Done, FNP

## 2020-04-06 ENCOUNTER — Ambulatory Visit (INDEPENDENT_AMBULATORY_CARE_PROVIDER_SITE_OTHER): Payer: Medicare HMO | Admitting: Nurse Practitioner

## 2020-04-06 ENCOUNTER — Encounter: Payer: Self-pay | Admitting: Nurse Practitioner

## 2020-04-06 ENCOUNTER — Other Ambulatory Visit: Payer: Self-pay

## 2020-04-06 VITALS — BP 108/68 | HR 85 | Temp 97.7°F | Resp 20 | Ht 64.0 in | Wt 174.0 lb

## 2020-04-06 DIAGNOSIS — E034 Atrophy of thyroid (acquired): Secondary | ICD-10-CM

## 2020-04-06 DIAGNOSIS — G2581 Restless legs syndrome: Secondary | ICD-10-CM | POA: Diagnosis not present

## 2020-04-06 DIAGNOSIS — F5101 Primary insomnia: Secondary | ICD-10-CM

## 2020-04-06 DIAGNOSIS — D649 Anemia, unspecified: Secondary | ICD-10-CM

## 2020-04-06 DIAGNOSIS — G629 Polyneuropathy, unspecified: Secondary | ICD-10-CM

## 2020-04-06 DIAGNOSIS — E785 Hyperlipidemia, unspecified: Secondary | ICD-10-CM

## 2020-04-06 DIAGNOSIS — E538 Deficiency of other specified B group vitamins: Secondary | ICD-10-CM | POA: Diagnosis not present

## 2020-04-06 DIAGNOSIS — Z87440 Personal history of urinary (tract) infections: Secondary | ICD-10-CM | POA: Diagnosis not present

## 2020-04-06 DIAGNOSIS — Z6831 Body mass index (BMI) 31.0-31.9, adult: Secondary | ICD-10-CM

## 2020-04-06 DIAGNOSIS — R69 Illness, unspecified: Secondary | ICD-10-CM | POA: Diagnosis not present

## 2020-04-06 DIAGNOSIS — I10 Essential (primary) hypertension: Secondary | ICD-10-CM | POA: Diagnosis not present

## 2020-04-06 DIAGNOSIS — F411 Generalized anxiety disorder: Secondary | ICD-10-CM

## 2020-04-06 DIAGNOSIS — F3342 Major depressive disorder, recurrent, in full remission: Secondary | ICD-10-CM

## 2020-04-06 LAB — URINALYSIS, COMPLETE
Bilirubin, UA: NEGATIVE
Glucose, UA: NEGATIVE
Ketones, UA: NEGATIVE
Nitrite, UA: POSITIVE — AB
Protein,UA: NEGATIVE
Specific Gravity, UA: 1.02 (ref 1.005–1.030)
Urobilinogen, Ur: 1 mg/dL (ref 0.2–1.0)
pH, UA: 6.5 (ref 5.0–7.5)

## 2020-04-06 LAB — MICROSCOPIC EXAMINATION: Epithelial Cells (non renal): NONE SEEN /hpf (ref 0–10)

## 2020-04-06 MED ORDER — SERTRALINE HCL 50 MG PO TABS: 50.0000 mg | ORAL_TABLET | Freq: Every day | ORAL | 1 refills | Status: DC

## 2020-04-06 MED ORDER — ROPINIROLE HCL 0.25 MG PO TABS: 0.2500 mg | ORAL_TABLET | Freq: Three times a day (TID) | ORAL | 1 refills | Status: DC

## 2020-04-06 MED ORDER — LEVOTHYROXINE SODIUM 75 MCG PO TABS: 75.0000 ug | ORAL_TABLET | Freq: Every day | ORAL | 1 refills | Status: DC

## 2020-04-06 MED ORDER — ZOLPIDEM TARTRATE 10 MG PO TABS: 10.0000 mg | ORAL_TABLET | Freq: Every evening | ORAL | 5 refills | Status: DC | PRN

## 2020-04-06 MED ORDER — BENAZEPRIL-HYDROCHLOROTHIAZIDE 20-12.5 MG PO TABS: 0.5000 | ORAL_TABLET | Freq: Every day | ORAL | 1 refills | Status: DC

## 2020-04-06 MED ORDER — GABAPENTIN 100 MG PO CAPS: 100.0000 mg | ORAL_CAPSULE | Freq: Three times a day (TID) | ORAL | 1 refills | Status: DC

## 2020-04-06 MED ORDER — ALPRAZOLAM 0.25 MG PO TABS: 0.2500 mg | ORAL_TABLET | Freq: Two times a day (BID) | ORAL | 5 refills | Status: DC | PRN

## 2020-04-06 NOTE — Patient Instructions (Signed)
Neuropathic Pain Neuropathic pain is pain caused by damage to the nerves that are responsible for certain sensations in your body (sensory nerves). The pain can be caused by:  Damage to the sensory nerves that send signals to your spinal cord and brain (peripheral nervous system).  Damage to the sensory nerves in your brain or spinal cord (central nervous system). Neuropathic pain can make you more sensitive to pain. Even a minor sensation can feel very painful. This is usually a long-term condition that can be difficult to treat. The type of pain differs from person to person. It may:  Start suddenly (acute), or it may develop slowly and last for a long time (chronic).  Come and go as damaged nerves heal, or it may stay at the same level for years.  Cause emotional distress, loss of sleep, and a lower quality of life. What are the causes? The most common cause of this condition is diabetes. Many other diseases and conditions can also cause neuropathic pain. Causes of neuropathic pain can be classified as:  Toxic. This is caused by medicines and chemicals. The most common cause of toxic neuropathic pain is damage from cancer treatments (chemotherapy).  Metabolic. This can be caused by: ? Diabetes. This is the most common disease that damages the nerves. ? Lack of vitamin B from long-term alcohol abuse.  Traumatic. Any injury that cuts, crushes, or stretches a nerve can cause damage and pain. A common example is feeling pain after losing an arm or leg (phantom limb pain).  Compression-related. If a sensory nerve gets trapped or compressed for a long period of time, the blood supply to the nerve can be cut off.  Vascular. Many blood vessel diseases can cause neuropathic pain by decreasing blood supply and oxygen to nerves.  Autoimmune. This type of pain results from diseases in which the body's defense system (immune system) mistakenly attacks sensory nerves. Examples of autoimmune diseases  that can cause neuropathic pain include lupus and multiple sclerosis.  Infectious. Many types of viral infections can damage sensory nerves and cause pain. Shingles infection is a common cause of this type of pain.  Inherited. Neuropathic pain can be a symptom of many diseases that are passed down through families (genetic). What increases the risk? You are more likely to develop this condition if:  You have diabetes.  You smoke.  You drink too much alcohol.  You are taking certain medicines, including medicines that kill cancer cells (chemotherapy) or that treat immune system disorders. What are the signs or symptoms? The main symptom is pain. Neuropathic pain is often described as:  Burning.  Shock-like.  Stinging.  Hot or cold.  Itching. How is this diagnosed? No single test can diagnose neuropathic pain. It is diagnosed based on:  Physical exam and your symptoms. Your health care provider will ask you about your pain. You may be asked to use a pain scale to describe how bad your pain is.  Tests. These may be done to see if you have a high sensitivity to pain and to help find the cause and location of any sensory nerve damage. They include: ? Nerve conduction studies to test how well nerve signals travel through your sensory nerves (electrodiagnostic testing). ? Stimulating your sensory nerves through electrodes on your skin and measuring the response in your spinal cord and brain (somatosensory evoked potential).  Imaging studies, such as: ? X-rays. ? CT scan. ? MRI. How is this treated? Treatment for neuropathic pain may change   over time. You may need to try different treatment options or a combination of treatments. Some options include:  Treating the underlying cause of the neuropathy, such as diabetes, kidney disease, or vitamin deficiencies.  Stopping medicines that can cause neuropathy, such as chemotherapy.  Medicine to relieve pain. Medicines may  include: ? Prescription or over-the-counter pain medicine. ? Anti-seizure medicine. ? Antidepressant medicines. ? Pain-relieving patches that are applied to painful areas of skin. ? A medicine to numb the area (local anesthetic), which can be injected as a nerve block.  Transcutaneous nerve stimulation. This uses electrical currents to block painful nerve signals. The treatment is painless.  Alternative treatments, such as: ? Acupuncture. ? Meditation. ? Massage. ? Physical therapy. ? Pain management programs. ? Counseling. Follow these instructions at home: Medicines   Take over-the-counter and prescription medicines only as told by your health care provider.  Do not drive or use heavy machinery while taking prescription pain medicine.  If you are taking prescription pain medicine, take actions to prevent or treat constipation. Your health care provider may recommend that you: ? Drink enough fluid to keep your urine pale yellow. ? Eat foods that are high in fiber, such as fresh fruits and vegetables, whole grains, and beans. ? Limit foods that are high in fat and processed sugars, such as fried or sweet foods. ? Take an over-the-counter or prescription medicine for constipation. Lifestyle   Have a good support system at home.  Consider joining a chronic pain support group.  Do not use any products that contain nicotine or tobacco, such as cigarettes and e-cigarettes. If you need help quitting, ask your health care provider.  Do not drink alcohol. General instructions  Learn as much as you can about your condition.  Work closely with all your health care providers to find the treatment plan that works best for you.  Ask your health care provider what activities are safe for you.  Keep all follow-up visits as told by your health care provider. This is important. Contact a health care provider if:  Your pain treatments are not working.  You are having side effects  from your medicines.  You are struggling with tiredness (fatigue), mood changes, depression, or anxiety. Summary  Neuropathic pain is pain caused by damage to the nerves that are responsible for certain sensations in your body (sensory nerves).  Neuropathic pain may come and go as damaged nerves heal, or it may stay at the same level for years.  Neuropathic pain is usually a long-term condition that can be difficult to treat. Consider joining a chronic pain support group. This information is not intended to replace advice given to you by your health care provider. Make sure you discuss any questions you have with your health care provider. Document Revised: 12/20/2018 Document Reviewed: 09/15/2017 Elsevier Patient Education  2020 [employer] Inc.  

## 2020-04-06 NOTE — Addendum Note (Signed)
Addended by: Rolena Infante on: 04/06/2020 08:44 AM   Modules accepted: Orders

## 2020-04-06 NOTE — Addendum Note (Signed)
Addended by: Chevis Pretty on: 04/06/2020 08:43 AM   Modules accepted: Orders

## 2020-04-06 NOTE — Progress Notes (Signed)
Subjective:    Patient ID: Phyllis Walker, female    DOB: Jan 27, 1946, 74 y.o.   MRN: [id number]   Chief Complaint: Medical Management of Chronic Issues    HPI:  1. Hypertension, benign essential, goal below 140/90 No c/o chest pain, sob or headache. Does not check blood pressure at home. BP Readings from Last 3 Encounters:  04/06/20 108/68  03/09/20 (!) 144/77  12/23/19 (!) 158/76     2. Hyperlipidemia with target LDL less than 100 Does not really watch diet. Does no dedicated exercise but does stay very active.refuses statin. Lab Results  Component Value Date   CHOL 217 (H) 10/08/2019   HDL 64 10/08/2019   LDLCALC 139 (H) 10/08/2019   TRIG 78 10/08/2019   CHOLHDL 3.4 10/08/2019   The 10-year ASCVD risk score Mikey Bussing DC Jr., et al., 2013) is: 14%   3. Hypothyroidism due to acquired atrophy of thyroid Having no issues that she is aware of.  4. Restless leg syndrome is on requip and that is working well.  5. Neuropathy Has in both feet. The gabapentin works well to keep under control.  6. Anemia, unspecified type No c/o fatigue. Lab Results  Component Value Date   HGB 10.3 (L) 10/22/2019     7. Recurrent major depressive disorder, in full remission ([place]) Is on zoloft and is doing well. Depression screen [facility] 2/9 04/06/2020 03/09/2020 10/08/2019  Decreased Interest 0 0 0  Down, Depressed, Hopeless 0 0 0  PHQ - 2 Score 0 0 0  Altered sleeping 0 - -  Tired, decreased energy 0 - -  Change in appetite 0 - -  Feeling bad or failure about yourself  0 - -  Trouble concentrating 0 - -  Moving slowly or fidgety/restless 0 - -  Suicidal thoughts 0 - -  PHQ-9 Score 0 - -  Difficult doing work/chores Not difficult at all - -  Some recent data might be hidden     8. GAD (generalized anxiety disorder) Is on xanx BID. Stays anxious if she does not take. GAD 7 : Generalized Anxiety Score 04/06/2020 12/27/2016  Nervous, Anxious, on Edge 0 0  Control/stop worrying 0 1    Worry too much - different things 0 1  Trouble relaxing 0 0  Restless 0 0  Easily annoyed or irritable 0 0  Afraid - awful might happen 0 0  Total GAD 7 Score 0 2  Anxiety Difficulty Not difficult at all Not difficult at all      9. insomnia Is on ambirn nightly and is doing well.  10.  BMI 31.0-31.9,adult Weight is down 7lbs since last visit Wt Readings from Last 3 Encounters:  04/06/20 174 lb (78.9 kg)  03/09/20 181 lb (82.1 kg)  12/23/19 179 lb 9.6 oz (81.5 kg)   BMI Readings from Last 3 Encounters:  04/06/20 29.87 kg/m  03/09/20 31.07 kg/m  12/23/19 30.83 kg/m        Outpatient Encounter Medications as of 04/06/2020  Medication Sig  . ALPRAZolam (XANAX) 0.25 MG tablet Take 1 tablet (0.25 mg total) by mouth 2 (two) times daily as needed for anxiety.  . benazepril-hydrochlorthiazide (LOTENSIN HCT) 20-12.5 MG tablet Take 0.5 tablets by mouth daily.  [name] gabapentin (NEURONTIN) 100 MG capsule Take 1 capsule (100 mg total) by mouth 3 (three) times daily.  [name] letrozole (FEMARA) 2.5 MG tablet Take 1 tablet (2.5 mg total) by mouth daily.  [name] levothyroxine (SYNTHROID) 75 MCG tablet Take 1  tablet (75 mcg total) by mouth daily.  . Omega-3 Fatty Acids (FISH OIL) 1200 MG CAPS Take 1,200 mg by mouth daily.  [name] rOPINIRole (REQUIP) 0.25 MG tablet Take 1 tablet (0.25 mg total) by mouth 3 (three) times daily.  . sertraline (ZOLOFT) 50 MG tablet Take 1 tablet (50 mg total) by mouth daily.  [name] zolpidem (AMBIEN) 10 MG tablet Take 1 tablet (10 mg total) by mouth at bedtime as needed. for sleep    Past Surgical History:  Procedure Laterality Date  . BREAST LUMPECTOMY  1987   benign fibroid tumor  . BREAST LUMPECTOMY WITH RADIOACTIVE SEED AND SENTINEL LYMPH NODE BIOPSY Right 07/05/2018   Procedure: RIGHT BREAST LUMPECTOMY WITH RADIOACTIVE SEED X 2 AND RIGHT AXILLARY SENTINEL LYMPH NODE BIOPSY;  Surgeon: Alphonsa Overall, MD;  Location: [place];  Service: General;  Laterality:  Right;  . BUNIONECTOMY Bilateral   . CATARACT EXTRACTION    . COLONOSCOPY    . THYROIDECTOMY, PARTIAL  1969  . TONSILLECTOMY    . TOTAL KNEE ARTHROPLASTY Right 08/15/2016   Procedure: TOTAL KNEE ARTHROPLASTY;  Surgeon: Vickey Huger, MD;  Location: [place];  Service: Orthopedics;  Laterality: Right;  RNFA    Family History  Problem Relation Age of Onset  . Cancer Mother        pancreas  . Heart attack Father   . Hypertension Sister   . Hyperlipidemia Sister   . Anuerysm Sister   . Hypertension Sister   . Hyperlipidemia Sister   . Early death Sister 29       accident  . Ovarian cancer Maternal Aunt     New complaints: None today  Social history: Lives by herself. Is getting married in th enext few months  Controlled substance contract: 04/06/20    Review of Systems  Constitutional: Negative for diaphoresis.  Eyes: Negative for pain.  Respiratory: Negative for shortness of breath.   Cardiovascular: Negative for chest pain, palpitations and leg swelling.  Gastrointestinal: Negative for abdominal pain.  Endocrine: Negative for polydipsia.  Skin: Negative for rash.  Neurological: Negative for dizziness, weakness and headaches.  Hematological: Does not bruise/bleed easily.  All other systems reviewed and are negative.      Objective:   Physical Exam Vitals and nursing note reviewed.  Constitutional:      General: She is not in acute distress.    Appearance: Normal appearance. She is well-developed.  HENT:     Head: Normocephalic.     Nose: Nose normal.  Eyes:     Pupils: Pupils are equal, round, and reactive to light.  Neck:     Vascular: No carotid bruit or JVD.  Cardiovascular:     Rate and Rhythm: Normal rate and regular rhythm.     Heart sounds: Normal heart sounds.  Pulmonary:     Effort: Pulmonary effort is normal. No respiratory distress.     Breath sounds: Normal breath sounds. No wheezing or rales.  Chest:     Chest wall: No tenderness.  Abdominal:      General: Bowel sounds are normal. There is no distension or abdominal bruit.     Palpations: Abdomen is soft. There is no hepatomegaly, splenomegaly, mass or pulsatile mass.     Tenderness: There is no abdominal tenderness.  Musculoskeletal:        General: Normal range of motion.     Cervical back: Normal range of motion and neck supple.  Lymphadenopathy:     Cervical: No  cervical adenopathy.  Skin:    General: Skin is warm and dry.  Neurological:     Mental Status: She is alert and oriented to person, place, and time.     Deep Tendon Reflexes: Reflexes are normal and symmetric.  Psychiatric:        Behavior: Behavior normal.        Thought Content: Thought content normal.        Judgment: Judgment normal.     BP 108/68   Pulse 85   Temp 97.7 F (36.5 C) (Temporal)   Resp 20   Ht 5\' 4"  (1.626 m)   Wt 174 lb (78.9 kg)   SpO2 96%   BMI 29.87 kg/m        Assessment & Plan:  DAVION MEARA comes in today with chief complaint of Medical Management of Chronic Issues   Diagnosis and orders addressed:  1. Hypertension, benign essential, goal below 140/90 Low sodium diet - benazepril-hydrochlorthiazide (LOTENSIN HCT) 20-12.5 MG tablet; Take 0.5 tablets by mouth daily.  Dispense: 90 tablet; Refill: 1  2. Hyperlipidemia with target LDL less than 100 Low fat diet  3. Hypothyroidism due to acquired atrophy of thyroid - levothyroxine (SYNTHROID) 75 MCG tablet; Take 1 tablet (75 mcg total) by mouth daily.  Dispense: 90 tablet; Refill: 1  4. Restless leg syndrome Keep legs warm at night - rOPINIRole (REQUIP) 0.25 MG tablet; Take 1 tablet (0.25 mg total) by mouth 3 (three) times daily.  Dispense: 270 tablet; Refill: 1  5. Neuropathy Do not go barefooted - gabapentin (NEURONTIN) 100 MG capsule; Take 1 capsule (100 mg total) by mouth 3 (three) times daily.  Dispense: 270 capsule; Refill: 1  6. Anemia, unspecified type Labs pending  7. Recurrent major depressive disorder,  in full remission ([place]) Stress management - sertraline (ZOLOFT) 50 MG tablet; Take 1 tablet (50 mg total) by mouth daily.  Dispense: 90 tablet; Refill: 1  8. GAD (generalized anxiety disorder) - ALPRAZolam (XANAX) 0.25 MG tablet; Take 1 tablet (0.25 mg total) by mouth 2 (two) times daily as needed for anxiety.  Dispense: 60 tablet; Refill: 5  9. BMI 31.0-31.9,adult Discussed diet and exercise for person with BMI >25 Will recheck weight in 3-6 months  10. Primary insomnia Bedtime routine - zolpidem (AMBIEN) 10 MG tablet; Take 1 tablet (10 mg total) by mouth at bedtime as needed. for sleep  Dispense: 30 tablet; Refill: 5   Labs pending Health Maintenance reviewed Diet and exercise encouraged  Follow up plan: 6 months   Mary-Margaret Hassell Done, FNP

## 2020-04-07 ENCOUNTER — Other Ambulatory Visit: Payer: Self-pay | Admitting: Nurse Practitioner

## 2020-04-07 LAB — CMP14+EGFR
ALT: 9 IU/L (ref 0–32)
AST: 17 IU/L (ref 0–40)
Albumin/Globulin Ratio: 1.6 (ref 1.2–2.2)
Albumin: 3.9 g/dL (ref 3.7–4.7)
Alkaline Phosphatase: 95 IU/L (ref 48–121)
BUN/Creatinine Ratio: 18 (ref 12–28)
BUN: 14 mg/dL (ref 8–27)
Bilirubin Total: 0.4 mg/dL (ref 0.0–1.2)
CO2: 24 mmol/L (ref 20–29)
Calcium: 8.9 mg/dL (ref 8.7–10.3)
Chloride: 101 mmol/L (ref 96–106)
Creatinine, Ser: 0.79 mg/dL (ref 0.57–1.00)
GFR calc Af Amer: 85 mL/min/{1.73_m2} (ref >59)
GFR calc non Af Amer: 74 mL/min/{1.73_m2} (ref >59)
Globulin, Total: 2.4 g/dL (ref 1.5–4.5)
Glucose: 80 mg/dL (ref 65–99)
Potassium: 3.9 mmol/L (ref 3.5–5.2)
Sodium: 141 mmol/L (ref 134–144)
Total Protein: 6.3 g/dL (ref 6.0–8.5)

## 2020-04-07 LAB — LIPID PANEL
Chol/HDL Ratio: 4.1 ratio (ref 0.0–4.4)
Cholesterol, Total: 219 mg/dL — ABNORMAL HIGH (ref 100–199)
HDL: 54 mg/dL (ref >39)
LDL Chol Calc (NIH): 150 mg/dL — ABNORMAL HIGH (ref 0–99)
Triglycerides: 86 mg/dL (ref 0–149)
VLDL Cholesterol Cal: 15 mg/dL (ref 5–40)

## 2020-04-07 LAB — CBC WITH DIFFERENTIAL/PLATELET
Basophils Absolute: 0.1 10*3/uL (ref 0.0–0.2)
Basos: 2 %
EOS (ABSOLUTE): 0.4 10*3/uL (ref 0.0–0.4)
Eos: 7 %
Hematocrit: 34.5 % (ref 34.0–46.6)
Hemoglobin: 11.4 g/dL (ref 11.1–15.9)
Immature Grans (Abs): 0 10*3/uL (ref 0.0–0.1)
Immature Granulocytes: 0 %
Lymphocytes Absolute: 1.7 10*3/uL (ref 0.7–3.1)
Lymphs: 29 %
MCH: 28.9 pg (ref 26.6–33.0)
MCHC: 33 g/dL (ref 31.5–35.7)
MCV: 88 fL (ref 79–97)
Monocytes Absolute: 0.4 10*3/uL (ref 0.1–0.9)
Monocytes: 7 %
Neutrophils Absolute: 3.3 10*3/uL (ref 1.4–7.0)
Neutrophils: 55 %
Platelets: 213 10*3/uL (ref 150–450)
RBC: 3.94 x10E6/uL (ref 3.77–5.28)
RDW: 13.2 % (ref 11.7–15.4)
WBC: 5.9 10*3/uL (ref 3.4–10.8)

## 2020-04-07 LAB — THYROID PANEL WITH TSH
Free Thyroxine Index: 2.2 (ref 1.2–4.9)
T3 Uptake Ratio: 24 % (ref 24–39)
T4, Total: 9.1 ug/dL (ref 4.5–12.0)
TSH: 1.05 u[IU]/mL (ref 0.450–4.500)

## 2020-04-07 MED ORDER — SULFAMETHOXAZOLE-TRIMETHOPRIM 800-160 MG PO TABS: 1.0000 | ORAL_TABLET | Freq: Two times a day (BID) | ORAL | 0 refills | Status: DC

## 2020-04-07 NOTE — Addendum Note (Signed)
Addended by: Chevis Pretty on: 04/07/2020 07:51 AM   Modules accepted: Orders

## 2020-05-08 ENCOUNTER — Other Ambulatory Visit: Payer: Self-pay

## 2020-05-08 ENCOUNTER — Ambulatory Visit (INDEPENDENT_AMBULATORY_CARE_PROVIDER_SITE_OTHER): Payer: Medicare HMO | Admitting: *Deleted

## 2020-05-08 DIAGNOSIS — E538 Deficiency of other specified B group vitamins: Secondary | ICD-10-CM

## 2020-05-08 NOTE — Progress Notes (Signed)
B12 Injection given and tolerated well °

## 2020-06-04 DIAGNOSIS — R69 Illness, unspecified: Secondary | ICD-10-CM | POA: Diagnosis not present

## 2020-06-08 DIAGNOSIS — R69 Illness, unspecified: Secondary | ICD-10-CM | POA: Diagnosis not present

## 2020-06-09 ENCOUNTER — Other Ambulatory Visit: Payer: Self-pay

## 2020-06-09 ENCOUNTER — Ambulatory Visit (INDEPENDENT_AMBULATORY_CARE_PROVIDER_SITE_OTHER): Payer: Medicare HMO | Admitting: *Deleted

## 2020-06-09 DIAGNOSIS — Z23 Encounter for immunization: Secondary | ICD-10-CM | POA: Diagnosis not present

## 2020-06-09 DIAGNOSIS — E538 Deficiency of other specified B group vitamins: Secondary | ICD-10-CM

## 2020-06-09 NOTE — Progress Notes (Signed)
B12 injection given and tolerated well Flu vaccine given and tolerated well

## 2020-07-09 ENCOUNTER — Ambulatory Visit (INDEPENDENT_AMBULATORY_CARE_PROVIDER_SITE_OTHER): Payer: Medicare HMO

## 2020-07-09 ENCOUNTER — Other Ambulatory Visit: Payer: Self-pay

## 2020-07-09 DIAGNOSIS — E538 Deficiency of other specified B group vitamins: Secondary | ICD-10-CM | POA: Diagnosis not present

## 2020-07-09 NOTE — Progress Notes (Signed)
Cyanocobalamin injection given to left deltoid.  Patient tolerated well. 

## 2020-07-15 DIAGNOSIS — Z08 Encounter for follow-up examination after completed treatment for malignant neoplasm: Secondary | ICD-10-CM | POA: Diagnosis not present

## 2020-07-15 DIAGNOSIS — R922 Inconclusive mammogram: Secondary | ICD-10-CM | POA: Diagnosis not present

## 2020-07-15 DIAGNOSIS — Z853 Personal history of malignant neoplasm of breast: Secondary | ICD-10-CM | POA: Diagnosis not present

## 2020-07-15 LAB — HM MAMMOGRAPHY: HM Mammogram: NORMAL (ref 0–4)

## 2020-07-31 DIAGNOSIS — I10 Essential (primary) hypertension: Secondary | ICD-10-CM | POA: Diagnosis not present

## 2020-07-31 DIAGNOSIS — Z79811 Long term (current) use of aromatase inhibitors: Secondary | ICD-10-CM | POA: Diagnosis not present

## 2020-07-31 DIAGNOSIS — Z08 Encounter for follow-up examination after completed treatment for malignant neoplasm: Secondary | ICD-10-CM | POA: Diagnosis not present

## 2020-07-31 DIAGNOSIS — Z853 Personal history of malignant neoplasm of breast: Secondary | ICD-10-CM | POA: Diagnosis not present

## 2020-07-31 DIAGNOSIS — Z170 Estrogen receptor positive status [ER+]: Secondary | ICD-10-CM | POA: Diagnosis not present

## 2020-07-31 DIAGNOSIS — Z923 Personal history of irradiation: Secondary | ICD-10-CM | POA: Diagnosis not present

## 2020-07-31 DIAGNOSIS — C50211 Malignant neoplasm of upper-inner quadrant of right female breast: Secondary | ICD-10-CM | POA: Diagnosis not present

## 2020-07-31 DIAGNOSIS — M1712 Unilateral primary osteoarthritis, left knee: Secondary | ICD-10-CM | POA: Diagnosis not present

## 2020-08-10 ENCOUNTER — Ambulatory Visit (INDEPENDENT_AMBULATORY_CARE_PROVIDER_SITE_OTHER): Payer: Medicare HMO

## 2020-08-10 ENCOUNTER — Other Ambulatory Visit: Payer: Self-pay

## 2020-08-10 DIAGNOSIS — E538 Deficiency of other specified B group vitamins: Secondary | ICD-10-CM | POA: Diagnosis not present

## 2020-08-10 NOTE — Progress Notes (Signed)
Cyanocobalamin injection given to left deltoid.  Patient tolerated well. 

## 2020-09-09 ENCOUNTER — Ambulatory Visit (INDEPENDENT_AMBULATORY_CARE_PROVIDER_SITE_OTHER): Payer: Medicare HMO

## 2020-09-09 ENCOUNTER — Other Ambulatory Visit: Payer: Self-pay

## 2020-09-09 DIAGNOSIS — E538 Deficiency of other specified B group vitamins: Secondary | ICD-10-CM

## 2020-09-09 NOTE — Progress Notes (Signed)
B12 given and tolerated well °

## 2020-09-23 ENCOUNTER — Telehealth: Payer: Self-pay | Admitting: *Deleted

## 2020-09-23 NOTE — Telephone Encounter (Signed)
PA came in for Zolpidem 10 mg tabs Key: [id number]  SENT TO PLAN     FYI _  Her plan may deny - it appears that she may be required to try and fail Doxepin 3 or 6 mg

## 2020-09-24 NOTE — Telephone Encounter (Signed)
Clarisa Fling Key: [id number] - PA Case ID: [id number] Need help? Call us at [phone] Outcome Deniedtoday  Your request has been denied  Drug Zolpidem Tartrate 10MG  tablets Form [employer] Electronic PA Form [phone] NCPDP)

## 2020-09-25 MED ORDER — TRAZODONE HCL 50 MG PO TABS: 25.0000 mg | ORAL_TABLET | Freq: Every evening | ORAL | 3 refills | Status: DC | PRN

## 2020-09-25 NOTE — Telephone Encounter (Signed)
Please let patient know that insurance will no longer pay for her Phyllis Walker and see what she wants to do.

## 2020-09-25 NOTE — Telephone Encounter (Signed)
Pt agreed for Shelah Lewandowsky to prescribe another medication. She has not tried any other medications in the past for sleep. Please send to [facility].

## 2020-09-25 NOTE — Telephone Encounter (Signed)
Patient aware.

## 2020-09-25 NOTE — Telephone Encounter (Signed)
Sent in trazadone. Let me know if doe snot help

## 2020-10-07 ENCOUNTER — Encounter: Payer: Self-pay | Admitting: Nurse Practitioner

## 2020-10-07 ENCOUNTER — Ambulatory Visit (INDEPENDENT_AMBULATORY_CARE_PROVIDER_SITE_OTHER): Payer: Medicare HMO | Admitting: Nurse Practitioner

## 2020-10-07 ENCOUNTER — Other Ambulatory Visit: Payer: Self-pay

## 2020-10-07 VITALS — BP 117/80 | HR 97 | Temp 96.9°F | Resp 20 | Ht 64.0 in | Wt 174.0 lb

## 2020-10-07 DIAGNOSIS — E785 Hyperlipidemia, unspecified: Secondary | ICD-10-CM | POA: Diagnosis not present

## 2020-10-07 DIAGNOSIS — E039 Hypothyroidism, unspecified: Secondary | ICD-10-CM | POA: Diagnosis not present

## 2020-10-07 DIAGNOSIS — M80079D Age-related osteoporosis with current pathological fracture, unspecified ankle and foot, subsequent encounter for fracture with routine healing: Secondary | ICD-10-CM

## 2020-10-07 DIAGNOSIS — F3342 Major depressive disorder, recurrent, in full remission: Secondary | ICD-10-CM | POA: Diagnosis not present

## 2020-10-07 DIAGNOSIS — I10 Essential (primary) hypertension: Secondary | ICD-10-CM

## 2020-10-07 DIAGNOSIS — R739 Hyperglycemia, unspecified: Secondary | ICD-10-CM

## 2020-10-07 DIAGNOSIS — G629 Polyneuropathy, unspecified: Secondary | ICD-10-CM

## 2020-10-07 DIAGNOSIS — R69 Illness, unspecified: Secondary | ICD-10-CM | POA: Diagnosis not present

## 2020-10-07 DIAGNOSIS — G2581 Restless legs syndrome: Secondary | ICD-10-CM | POA: Diagnosis not present

## 2020-10-07 DIAGNOSIS — Z6831 Body mass index (BMI) 31.0-31.9, adult: Secondary | ICD-10-CM

## 2020-10-07 DIAGNOSIS — F5101 Primary insomnia: Secondary | ICD-10-CM

## 2020-10-07 DIAGNOSIS — D649 Anemia, unspecified: Secondary | ICD-10-CM

## 2020-10-07 DIAGNOSIS — F411 Generalized anxiety disorder: Secondary | ICD-10-CM

## 2020-10-07 DIAGNOSIS — E034 Atrophy of thyroid (acquired): Secondary | ICD-10-CM

## 2020-10-07 MED ORDER — TRAZODONE HCL 50 MG PO TABS: 25.0000 mg | ORAL_TABLET | Freq: Every evening | ORAL | 1 refills | Status: DC | PRN

## 2020-10-07 MED ORDER — SERTRALINE HCL 50 MG PO TABS
50.0000 mg | ORAL_TABLET | Freq: Every day | ORAL | 1 refills | Status: DC
Start: 2020-10-07 — End: 2021-04-08

## 2020-10-07 MED ORDER — LEVOTHYROXINE SODIUM 75 MCG PO TABS
75.0000 ug | ORAL_TABLET | Freq: Every day | ORAL | 1 refills | Status: DC
Start: 2020-10-07 — End: 2021-04-08

## 2020-10-07 MED ORDER — ALPRAZOLAM 0.25 MG PO TABS: 0.2500 mg | ORAL_TABLET | Freq: Two times a day (BID) | ORAL | 5 refills | Status: DC | PRN

## 2020-10-07 MED ORDER — ROPINIROLE HCL 0.25 MG PO TABS
0.2500 mg | ORAL_TABLET | Freq: Three times a day (TID) | ORAL | 1 refills | Status: DC
Start: 2020-10-07 — End: 2021-04-08

## 2020-10-07 MED ORDER — GABAPENTIN 100 MG PO CAPS: 100.0000 mg | ORAL_CAPSULE | Freq: Three times a day (TID) | ORAL | 1 refills | Status: DC

## 2020-10-07 MED ORDER — BENAZEPRIL-HYDROCHLOROTHIAZIDE 20-12.5 MG PO TABS: 0.5000 | ORAL_TABLET | Freq: Every day | ORAL | 1 refills | Status: DC

## 2020-10-07 NOTE — Progress Notes (Signed)
Subjective:    Patient ID: Phyllis Walker, female    DOB: 1945/12/17, 75 y.o.   MRN: [id number]   Chief Complaint: Medical Management of Chronic Issues    HPI:  1. Hypertension, benign essential, goal below 140/90 No c/o chest pain, sob or headache. Does not check blood pressure at home. BP Readings from Last 3 Encounters:  04/06/20 108/68  03/09/20 (!) 144/77  12/23/19 (!) 158/76     2. Hyperlipidemia with target LDL less than 100 Does not watch diet and does very little exercise. Lab Results  Component Value Date   CHOL 219 (H) 04/06/2020   HDL 54 04/06/2020   LDLCALC 150 (H) 04/06/2020   TRIG 86 04/06/2020   CHOLHDL 4.1 04/06/2020  The 10-year ASCVD risk score Mikey Bussing DC Jr., et al., 2013) is: 18%   Values used to calculate the score:     Age: 58 years     Sex: Female     Is Non-Hispanic African American: No     Diabetic: No     Tobacco smoker: No     Systolic Blood Pressure: 123XX123 mmHg     Is BP treated: Yes     HDL Cholesterol: 54 mg/dL     Total Cholesterol: 219 mg/dL    3. Acquired hypothyroidism No problems that she is aware of. Lab Results  Component Value Date   TSH 1.050 04/06/2020     4. Recurrent major depressive disorder, in full remission ([place]) Is currently on zoloft daily and is doing well. denies any medication side effects. Depression screen [facility] 2/9 10/07/2020 04/06/2020 03/09/2020  Decreased Interest 0 0 0  Down, Depressed, Hopeless 0 0 0  PHQ - 2 Score 0 0 0  Altered sleeping 0 0 -  Tired, decreased energy 0 0 -  Change in appetite 0 0 -  Feeling bad or failure about yourself  0 0 -  Trouble concentrating 0 0 -  Moving slowly or fidgety/restless 0 0 -  Suicidal thoughts 0 0 -  PHQ-9 Score 0 0 -  Difficult doing work/chores Not difficult at all Not difficult at all -  Some recent data might be hidden     5. GAD (generalized anxiety disorder) Is on xanax bid and is doing well.   GAD 7 : Generalized Anxiety Score 10/07/2020 04/06/2020  12/27/2016  Nervous, Anxious, on Edge 0 0 0  Control/stop worrying 1 0 1  Worry too much - different things 0 0 1  Trouble relaxing 0 0 0  Restless 0 0 0  Easily annoyed or irritable 0 0 0  Afraid - awful might happen 0 0 0  Total GAD 7 Score 1 0 2  Anxiety Difficulty Not difficult at all Not difficult at all Not difficult at all       6. Primary insomnia Takes trazadone to sleep at night. Sleeps 9 hours  7. Anemia, unspecified type No c/o fatigue Lab Results  Component Value Date   HGB 11.4 04/06/2020     8. Restless leg syndrome Is on requip which works well to keep legs calm  9. Osteoporosis with pathological fracture of ankle and foot, unspecified laterality, with routine healing, subsequent encounter Last dexascan was done years ago. Patient refuses to repeat.  10. BMI 31.0-31.9,adult No recent weight changes. Wt Readings from Last 3 Encounters:  10/07/20 174 lb (78.9 kg)  04/06/20 174 lb (78.9 kg)  03/09/20 181 lb (82.1 kg)   BMI Readings from Last 3 Encounters:  10/07/20 29.87 kg/m  04/06/20 29.87 kg/m  03/09/20 31.07 kg/m       Outpatient Encounter Medications as of 10/07/2020  Medication Sig  . ALPRAZolam (XANAX) 0.25 MG tablet Take 1 tablet (0.25 mg total) by mouth 2 (two) times daily as needed for anxiety.  . benazepril-hydrochlorthiazide (LOTENSIN HCT) 20-12.5 MG tablet Take 0.5 tablets by mouth daily.  [name] gabapentin (NEURONTIN) 100 MG capsule Take 1 capsule (100 mg total) by mouth 3 (three) times daily.  [name] letrozole (FEMARA) 2.5 MG tablet Take 1 tablet (2.5 mg total) by mouth daily.  [name] levothyroxine (SYNTHROID) 75 MCG tablet Take 1 tablet (75 mcg total) by mouth daily.  . Omega-3 Fatty Acids (FISH OIL) 1200 MG CAPS Take 1,200 mg by mouth daily.  [name] rOPINIRole (REQUIP) 0.25 MG tablet Take 1 tablet (0.25 mg total) by mouth 3 (three) times daily.  . sertraline (ZOLOFT) 50 MG tablet Take 1 tablet (50 mg total) by mouth daily.  . traZODone (DESYREL) 50  MG tablet Take 0.5-1 tablets (25-50 mg total) by mouth at bedtime as needed for sleep.     Past Surgical History:  Procedure Laterality Date  . BREAST LUMPECTOMY  1987   benign fibroid tumor  . BREAST LUMPECTOMY WITH RADIOACTIVE SEED AND SENTINEL LYMPH NODE BIOPSY Right 07/05/2018   Procedure: RIGHT BREAST LUMPECTOMY WITH RADIOACTIVE SEED X 2 AND RIGHT AXILLARY SENTINEL LYMPH NODE BIOPSY;  Surgeon: Alphonsa Overall, MD;  Location: [place];  Service: General;  Laterality: Right;  . BUNIONECTOMY Bilateral   . CATARACT EXTRACTION    . COLONOSCOPY    . THYROIDECTOMY, PARTIAL  1969  . TONSILLECTOMY    . TOTAL KNEE ARTHROPLASTY Right 08/15/2016   Procedure: TOTAL KNEE ARTHROPLASTY;  Surgeon: Vickey Huger, MD;  Location: [place];  Service: Orthopedics;  Laterality: Right;  RNFA    Family History  Problem Relation Age of Onset  . Cancer Mother        pancreas  . Heart attack Father   . Hypertension Sister   . Hyperlipidemia Sister   . Anuerysm Sister   . Hypertension Sister   . Hyperlipidemia Sister   . Early death Sister 33       accident  . Ovarian cancer Maternal Aunt     New complaints: None today  Social history: Remarried this past november  Controlled substance contract: 10/07/20    Review of Systems  Constitutional: Negative for diaphoresis.  Eyes: Negative for pain.  Respiratory: Negative for shortness of breath.   Cardiovascular: Negative for chest pain, palpitations and leg swelling.  Gastrointestinal: Negative for abdominal pain.  Endocrine: Negative for polydipsia.  Skin: Negative for rash.  Neurological: Negative for dizziness, weakness and headaches.  Hematological: Does not bruise/bleed easily.  All other systems reviewed and are negative.      Objective:   Physical Exam Vitals and nursing note reviewed.  Constitutional:      General: She is not in acute distress.    Appearance: Normal appearance. She is well-developed and  well-nourished.  HENT:     Head: Normocephalic.     Nose: Nose normal.     Mouth/Throat:     Mouth: Oropharynx is clear and moist.  Eyes:     Extraocular Movements: EOM normal.     Pupils: Pupils are equal, round, and reactive to light.  Neck:     Vascular: No carotid bruit or JVD.  Cardiovascular:     Rate and Rhythm: Normal rate and regular rhythm.  Pulses: Intact distal pulses.     Heart sounds: Normal heart sounds.  Pulmonary:     Effort: Pulmonary effort is normal. No respiratory distress.     Breath sounds: Normal breath sounds. No wheezing or rales.  Chest:     Chest wall: No tenderness.  Abdominal:     General: Bowel sounds are normal. There is no distension or abdominal bruit. Aorta is normal.     Palpations: Abdomen is soft. There is no hepatomegaly, splenomegaly, mass or pulsatile mass.     Tenderness: There is no abdominal tenderness.  Musculoskeletal:        General: No edema. Normal range of motion.     Cervical back: Normal range of motion and neck supple.  Lymphadenopathy:     Cervical: No cervical adenopathy.  Skin:    General: Skin is warm and dry.  Neurological:     Mental Status: She is alert and oriented to person, place, and time.     Deep Tendon Reflexes: Reflexes are normal and symmetric.  Psychiatric:        Mood and Affect: Mood and affect normal.        Behavior: Behavior normal.        Thought Content: Thought content normal.        Judgment: Judgment normal.    BP 117/80   Pulse 97   Temp (!) 96.9 F (36.1 C) (Temporal)   Resp 20   Ht 5\' 4"  (1.626 m)   Wt 174 lb (78.9 kg)   SpO2 95%   BMI 29.87 kg/m         Assessment & Plan:  JANEECE BLOK comes in today with chief complaint of Medical Management of Chronic Issues   Diagnosis and orders addressed:  1. Hypertension, benign essential, goal below 140/90 Low sodium diet - benazepril-hydrochlorthiazide (LOTENSIN HCT) 20-12.5 MG tablet; Take 0.5 tablets by mouth daily.   Dispense: 90 tablet; Refill: 1  2. Hyperlipidemia with target LDL less than 100 Low fat diet  3. Acquired hypothyroidism Labs pending - levothyroxine (SYNTHROID) 75 MCG tablet; Take 1 tablet (75 mcg total) by mouth daily.  Dispense: 90 tablet; Refill: 1  4. Recurrent major depressive disorder, in full remission ([place]) stress management - sertraline (ZOLOFT) 50 MG tablet; Take 1 tablet (50 mg total) by mouth daily.  Dispense: 90 tablet; Refill: 1  5. GAD (generalized anxiety disorder) - ALPRAZolam (XANAX) 0.25 MG tablet; Take 1 tablet (0.25 mg total) by mouth 2 (two) times daily as needed for anxiety.  Dispense: 60 tablet; Refill: 5  6. Primary insomnia Bedtime routine - traZODone (DESYREL) 50 MG tablet; Take 0.5-1 tablets (25-50 mg total) by mouth at bedtime as needed for sleep.  Dispense: 90 tablet; Refill: 1  7. Anemia, unspecified type Lab pending  8. Restless leg syndrome Keep legs warm - rOPINIRole (REQUIP) 0.25 MG tablet; Take 1 tablet (0.25 mg total) by mouth 3 (three) times daily.  Dispense: 270 tablet; Refill: 1  9. Osteoporosis with pathological fracture of ankle and foot, unspecified laterality, with routine healing, subsequent encounter Weight bearing exercise Refuses dexascan  10. BMI 31.0-31.9,adult Discussed diet and exercise for person with BMI >25 Will recheck weight in 3-6 months   12. Neuropathy - gabapentin (NEURONTIN) 100 MG capsule; Take 1 capsule (100 mg total) by mouth 3 (three) times daily.  Dispense: 270 capsule; Refill: 1   Labs pending Health Maintenance reviewed Diet and exercise encouraged  Follow up plan: 6 months  Mary-Margaret Hassell Done, FNP

## 2020-10-07 NOTE — Patient Instructions (Signed)
Textbook of family medicine (9th ed., pp. 5638-9373). [place], [place]: [employer].">  Stress, Adult Stress is a normal reaction to life events. Stress is what you feel when life demands more than you are used to, or more than you think you can handle. Some stress can be useful, such as studying for a test or meeting a deadline at work. Stress that occurs too often or for too long can cause problems. It can affect your emotional health and interfere with relationships and normal daily activities. Too much stress can weaken your body's defense system (immune system) and increase your risk for physical illness. If you already have a medical problem, stress can make it worse. What are the causes? All sorts of life events can cause stress. An event that causes stress for one person may not be stressful for another person. Major life events, whether positive or negative, commonly cause stress. Examples include:  Losing a job or starting a new job.  Losing a loved one.  Moving to a new town or home.  Getting married or divorced.  Having a baby.  Getting injured or sick. Less obvious life events can also cause stress, especially if they occur day after day or in combination with each other. Examples include:  Working long hours.  Driving in traffic.  Caring for children.  Being in debt.  Being in a difficult relationship. What are the signs or symptoms? Stress can cause emotional symptoms, including:  Anxiety. This is feeling worried, afraid, on edge, overwhelmed, or out of control.  Anger, including irritation or impatience.  Depression. This is feeling sad, down, helpless, or guilty.  Trouble focusing, remembering, or making decisions. Stress can cause physical symptoms, including:  Aches and pains. These may affect your head, neck, back, stomach, or other areas of your body.  Tight muscles or a clenched jaw.  Low energy.  Trouble sleeping. Stress can cause unhealthy  behaviors, including:  Eating to feel better (overeating) or skipping meals.  Working too much or putting off tasks.  Smoking, drinking alcohol, or using drugs to feel better. How is this diagnosed? Stress is diagnosed through an assessment by your health care provider. He or she may diagnose this condition based on:  Your symptoms and any stressful life events.  Your medical history.  Tests to rule out other causes of your symptoms. Depending on your condition, your health care provider may refer you to a specialist for further evaluation. How is this treated? Stress management techniques are the recommended treatment for stress. Medicine is not typically recommended for the treatment of stress. Techniques to reduce your reaction to stressful life events include:  Stress identification. Monitor yourself for symptoms of stress and identify what causes stress for you. These skills may help you to avoid or prepare for stressful events.  Time management. Set your priorities, keep a calendar of events, and learn to say no. Taking these actions can help you avoid making too many commitments. Techniques for coping with stress include:  Rethinking the problem. Try to think realistically about stressful events rather than ignoring them or overreacting. Try to find the positives in a stressful situation rather than focusing on the negatives.  Exercise. Physical exercise can release both physical and emotional tension. The key is to find a form of exercise that you enjoy and do it regularly.  Relaxation techniques. These relax the body and mind. The key is to find one or more that you enjoy and use the techniques regularly. Examples include: ?  Meditation, deep breathing, or progressive relaxation techniques. ? Yoga or tai chi. ? Biofeedback, mindfulness techniques, or journaling. ? Listening to music, being out in nature, or participating in other hobbies.  Practicing a healthy lifestyle.  Eat a balanced diet, drink plenty of water, limit or avoid caffeine, and get plenty of sleep.  Having a strong support network. Spend time with family, friends, or other people you enjoy being around. Express your feelings and talk things over with someone you trust. Counseling or talk therapy with a mental health professional may be helpful if you are having trouble managing stress on your own.   Follow these instructions at home: Lifestyle  Avoid drugs.  Do not use any products that contain nicotine or tobacco, such as cigarettes, e-cigarettes, and chewing tobacco. If you need help quitting, ask your health care provider.  Limit alcohol intake to no more than 1 drink a day for nonpregnant women and 2 drinks a day for men. One drink equals 12 oz of beer, 5 oz of wine, or 1 oz of hard liquor  Do not use alcohol or drugs to relax.  Eat a balanced diet that includes fresh fruits and vegetables, whole grains, lean meats, fish, eggs, and beans, and low-fat dairy. Avoid processed foods and foods high in added fat, sugar, and salt.  Exercise at least 30 minutes on 5 or more days each week.  Get 7-8 hours of sleep each night.   General instructions  Practice stress management techniques as discussed with your health care provider.  Drink enough fluid to keep your urine clear or pale yellow.  Take over-the-counter and prescription medicines only as told by your health care provider.  Keep all follow-up visits as told by your health care provider. This is important.   Contact a health care provider if:  Your symptoms get worse.  You have new symptoms.  You feel overwhelmed by your problems and can no longer manage them on your own. Get help right away if:  You have thoughts of hurting yourself or others. If you ever feel like you may hurt yourself or others, or have thoughts about taking your own life, get help right away. You can go to your nearest emergency department or  call:  Your local emergency services (911 in the [place]  A suicide crisis helpline, such as the National Suicide Prevention Lifeline at 1-800-273-8255. This is open 24 hours a day. Summary  Stress is a normal reaction to life events. It can cause problems if it happens too often or for too long.  Practicing stress management techniques is the best way to treat stress.  Counseling or talk therapy with a mental health professional may be helpful if you are having trouble managing stress on your own. This information is not intended to replace advice given to you by your health care provider. Make sure you discuss any questions you have with your health care provider. Document Revised: 05/15/2020 Document Reviewed: 05/15/2020 Elsevier Patient Education  2021 Elsevier Inc.  

## 2020-10-07 NOTE — Addendum Note (Signed)
Addended by: Chevis Pretty on: 10/07/2020 08:35 AM   Modules accepted: Orders

## 2020-10-08 LAB — CMP14+EGFR
ALT: 14 IU/L (ref 0–32)
AST: 16 IU/L (ref 0–40)
Albumin/Globulin Ratio: 1.7 (ref 1.2–2.2)
Albumin: 4 g/dL (ref 3.7–4.7)
Alkaline Phosphatase: 67 IU/L (ref 44–121)
BUN/Creatinine Ratio: 20 (ref 12–28)
BUN: 16 mg/dL (ref 8–27)
Bilirubin Total: 0.7 mg/dL (ref 0.0–1.2)
CO2: 24 mmol/L (ref 20–29)
Calcium: 8.9 mg/dL (ref 8.7–10.3)
Chloride: 101 mmol/L (ref 96–106)
Creatinine, Ser: 0.82 mg/dL (ref 0.57–1.00)
GFR calc Af Amer: 81 mL/min/{1.73_m2} (ref >59)
GFR calc non Af Amer: 70 mL/min/{1.73_m2} (ref >59)
Globulin, Total: 2.4 g/dL (ref 1.5–4.5)
Glucose: 201 mg/dL — ABNORMAL HIGH (ref 65–99)
Potassium: 4.3 mmol/L (ref 3.5–5.2)
Sodium: 138 mmol/L (ref 134–144)
Total Protein: 6.4 g/dL (ref 6.0–8.5)

## 2020-10-08 LAB — CBC WITH DIFFERENTIAL/PLATELET
Basophils Absolute: 0.1 10*3/uL (ref 0.0–0.2)
Basos: 2 %
EOS (ABSOLUTE): 0.4 10*3/uL (ref 0.0–0.4)
Eos: 6 %
Hematocrit: 37.9 % (ref 34.0–46.6)
Hemoglobin: 12.6 g/dL (ref 11.1–15.9)
Immature Grans (Abs): 0 10*3/uL (ref 0.0–0.1)
Immature Granulocytes: 0 %
Lymphocytes Absolute: 1.8 10*3/uL (ref 0.7–3.1)
Lymphs: 28 %
MCH: 29.2 pg (ref 26.6–33.0)
MCHC: 33.2 g/dL (ref 31.5–35.7)
MCV: 88 fL (ref 79–97)
Monocytes Absolute: 0.5 10*3/uL (ref 0.1–0.9)
Monocytes: 8 %
Neutrophils Absolute: 3.6 10*3/uL (ref 1.4–7.0)
Neutrophils: 56 %
Platelets: 209 10*3/uL (ref 150–450)
RBC: 4.32 x10E6/uL (ref 3.77–5.28)
RDW: 12.8 % (ref 11.7–15.4)
WBC: 6.4 10*3/uL (ref 3.4–10.8)

## 2020-10-08 LAB — THYROID PANEL WITH TSH
Free Thyroxine Index: 1.8 (ref 1.2–4.9)
T3 Uptake Ratio: 29 % (ref 24–39)
T4, Total: 6.2 ug/dL (ref 4.5–12.0)
TSH: 3.17 u[IU]/mL (ref 0.450–4.500)

## 2020-10-08 LAB — LIPID PANEL
Chol/HDL Ratio: 4 ratio (ref 0.0–4.4)
Cholesterol, Total: 135 mg/dL (ref 100–199)
HDL: 34 mg/dL — ABNORMAL LOW (ref >39)
LDL Chol Calc (NIH): 71 mg/dL (ref 0–99)
Triglycerides: 175 mg/dL — ABNORMAL HIGH (ref 0–149)
VLDL Cholesterol Cal: 30 mg/dL (ref 5–40)

## 2020-10-08 NOTE — Addendum Note (Signed)
Addended by: Chevis Pretty on: 10/08/2020 01:20 PM   Modules accepted: Orders

## 2020-10-12 ENCOUNTER — Ambulatory Visit (INDEPENDENT_AMBULATORY_CARE_PROVIDER_SITE_OTHER): Payer: Medicare HMO | Admitting: *Deleted

## 2020-10-12 ENCOUNTER — Other Ambulatory Visit: Payer: Self-pay

## 2020-10-12 DIAGNOSIS — E538 Deficiency of other specified B group vitamins: Secondary | ICD-10-CM

## 2020-10-13 ENCOUNTER — Other Ambulatory Visit: Payer: Self-pay | Admitting: Nurse Practitioner

## 2020-10-13 DIAGNOSIS — R739 Hyperglycemia, unspecified: Secondary | ICD-10-CM

## 2020-10-13 NOTE — Progress Notes (Signed)
[id number]

## 2020-10-15 LAB — SPECIMEN STATUS REPORT

## 2020-10-15 LAB — HGB A1C W/O EAG: Hgb A1c MFr Bld: 5 % (ref 4.8–5.6)

## 2020-10-23 ENCOUNTER — Other Ambulatory Visit: Payer: Self-pay | Admitting: Nurse Practitioner

## 2020-10-23 DIAGNOSIS — F5101 Primary insomnia: Secondary | ICD-10-CM

## 2020-11-09 ENCOUNTER — Telehealth: Payer: Self-pay

## 2020-11-09 ENCOUNTER — Other Ambulatory Visit: Payer: Self-pay

## 2020-11-09 ENCOUNTER — Ambulatory Visit (INDEPENDENT_AMBULATORY_CARE_PROVIDER_SITE_OTHER): Payer: Medicare HMO | Admitting: *Deleted

## 2020-11-09 DIAGNOSIS — E538 Deficiency of other specified B group vitamins: Secondary | ICD-10-CM | POA: Diagnosis not present

## 2020-12-08 ENCOUNTER — Ambulatory Visit (INDEPENDENT_AMBULATORY_CARE_PROVIDER_SITE_OTHER): Payer: Medicare HMO | Admitting: *Deleted

## 2020-12-08 ENCOUNTER — Other Ambulatory Visit: Payer: Self-pay

## 2020-12-08 DIAGNOSIS — E538 Deficiency of other specified B group vitamins: Secondary | ICD-10-CM | POA: Diagnosis not present

## 2020-12-08 NOTE — Progress Notes (Signed)
Pt given B12 injection IM left deltoid and tolerated well. °

## 2020-12-18 ENCOUNTER — Telehealth: Payer: Self-pay | Admitting: Hematology and Oncology

## 2020-12-18 NOTE — Telephone Encounter (Signed)
Per patient request, cancelled 4/12 appt. Patient does not want to r/s at this time

## 2020-12-22 ENCOUNTER — Ambulatory Visit: Payer: Medicare HMO | Admitting: Hematology and Oncology

## 2021-01-05 DIAGNOSIS — M1990 Unspecified osteoarthritis, unspecified site: Secondary | ICD-10-CM | POA: Diagnosis not present

## 2021-01-05 DIAGNOSIS — I10 Essential (primary) hypertension: Secondary | ICD-10-CM | POA: Diagnosis not present

## 2021-01-05 DIAGNOSIS — E669 Obesity, unspecified: Secondary | ICD-10-CM | POA: Diagnosis not present

## 2021-01-05 DIAGNOSIS — R69 Illness, unspecified: Secondary | ICD-10-CM | POA: Diagnosis not present

## 2021-01-05 DIAGNOSIS — G4700 Insomnia, unspecified: Secondary | ICD-10-CM | POA: Diagnosis not present

## 2021-01-05 DIAGNOSIS — G2581 Restless legs syndrome: Secondary | ICD-10-CM | POA: Diagnosis not present

## 2021-01-05 DIAGNOSIS — Z6830 Body mass index (BMI) 30.0-30.9, adult: Secondary | ICD-10-CM | POA: Diagnosis not present

## 2021-01-05 DIAGNOSIS — C50919 Malignant neoplasm of unspecified site of unspecified female breast: Secondary | ICD-10-CM | POA: Diagnosis not present

## 2021-01-05 DIAGNOSIS — J309 Allergic rhinitis, unspecified: Secondary | ICD-10-CM | POA: Diagnosis not present

## 2021-01-05 DIAGNOSIS — E039 Hypothyroidism, unspecified: Secondary | ICD-10-CM | POA: Diagnosis not present

## 2021-01-12 ENCOUNTER — Ambulatory Visit (INDEPENDENT_AMBULATORY_CARE_PROVIDER_SITE_OTHER): Payer: Medicare HMO

## 2021-01-12 ENCOUNTER — Other Ambulatory Visit: Payer: Self-pay

## 2021-01-12 DIAGNOSIS — E538 Deficiency of other specified B group vitamins: Secondary | ICD-10-CM | POA: Diagnosis not present

## 2021-01-12 NOTE — Progress Notes (Signed)
Cyanocobalamin injection given to left deltoid.  Patient tolerated well. 

## 2021-02-12 ENCOUNTER — Other Ambulatory Visit: Payer: Self-pay

## 2021-02-12 ENCOUNTER — Ambulatory Visit (INDEPENDENT_AMBULATORY_CARE_PROVIDER_SITE_OTHER): Payer: Medicare HMO | Admitting: Family Medicine

## 2021-02-12 DIAGNOSIS — E538 Deficiency of other specified B group vitamins: Secondary | ICD-10-CM

## 2021-02-16 ENCOUNTER — Telehealth: Payer: Self-pay | Admitting: Nurse Practitioner

## 2021-02-16 NOTE — Telephone Encounter (Signed)
  Prescription Request  02/16/2021  What is the name of the medication or equipment? trazodone  Have you contacted your pharmacy to request a refill? (if applicable) yes  Which pharmacy would you like this sent to? [facility]    Patient notified that their request is being sent to the clinical staff for review and that they should receive a response within 2 business days.

## 2021-02-16 NOTE — Telephone Encounter (Signed)
TC to [facility], they still have refills on file for pt, they are getting this ready for pt, called & let her know this

## 2021-02-24 ENCOUNTER — Other Ambulatory Visit: Payer: Self-pay

## 2021-02-24 MED ORDER — LETROZOLE 2.5 MG PO TABS: 2.5000 mg | ORAL_TABLET | Freq: Every day | ORAL | 0 refills | Status: DC

## 2021-02-25 ENCOUNTER — Other Ambulatory Visit: Payer: Self-pay | Admitting: Hematology and Oncology

## 2021-03-16 ENCOUNTER — Ambulatory Visit (INDEPENDENT_AMBULATORY_CARE_PROVIDER_SITE_OTHER): Payer: Medicare HMO

## 2021-03-16 ENCOUNTER — Other Ambulatory Visit: Payer: Self-pay

## 2021-03-16 DIAGNOSIS — E538 Deficiency of other specified B group vitamins: Secondary | ICD-10-CM | POA: Diagnosis not present

## 2021-03-16 NOTE — Progress Notes (Signed)
Cyanocobalamin injection given to left deltoid.  Patient tolerated well. 

## 2021-04-08 ENCOUNTER — Ambulatory Visit (INDEPENDENT_AMBULATORY_CARE_PROVIDER_SITE_OTHER): Payer: Medicare HMO | Admitting: Nurse Practitioner

## 2021-04-08 ENCOUNTER — Encounter: Payer: Self-pay | Admitting: Nurse Practitioner

## 2021-04-08 ENCOUNTER — Other Ambulatory Visit: Payer: Self-pay

## 2021-04-08 VITALS — BP 118/70 | HR 95 | Temp 97.2°F | Resp 20 | Ht 64.0 in | Wt 168.0 lb

## 2021-04-08 DIAGNOSIS — Z6828 Body mass index (BMI) 28.0-28.9, adult: Secondary | ICD-10-CM

## 2021-04-08 DIAGNOSIS — R300 Dysuria: Secondary | ICD-10-CM | POA: Diagnosis not present

## 2021-04-08 DIAGNOSIS — Z170 Estrogen receptor positive status [ER+]: Secondary | ICD-10-CM

## 2021-04-08 DIAGNOSIS — M80079D Age-related osteoporosis with current pathological fracture, unspecified ankle and foot, subsequent encounter for fracture with routine healing: Secondary | ICD-10-CM | POA: Diagnosis not present

## 2021-04-08 DIAGNOSIS — I10 Essential (primary) hypertension: Secondary | ICD-10-CM

## 2021-04-08 DIAGNOSIS — R69 Illness, unspecified: Secondary | ICD-10-CM | POA: Diagnosis not present

## 2021-04-08 DIAGNOSIS — G629 Polyneuropathy, unspecified: Secondary | ICD-10-CM

## 2021-04-08 DIAGNOSIS — Z23 Encounter for immunization: Secondary | ICD-10-CM

## 2021-04-08 DIAGNOSIS — D649 Anemia, unspecified: Secondary | ICD-10-CM

## 2021-04-08 DIAGNOSIS — F5101 Primary insomnia: Secondary | ICD-10-CM

## 2021-04-08 DIAGNOSIS — F411 Generalized anxiety disorder: Secondary | ICD-10-CM

## 2021-04-08 DIAGNOSIS — E039 Hypothyroidism, unspecified: Secondary | ICD-10-CM | POA: Diagnosis not present

## 2021-04-08 DIAGNOSIS — F3342 Major depressive disorder, recurrent, in full remission: Secondary | ICD-10-CM

## 2021-04-08 DIAGNOSIS — C50211 Malignant neoplasm of upper-inner quadrant of right female breast: Secondary | ICD-10-CM

## 2021-04-08 DIAGNOSIS — E785 Hyperlipidemia, unspecified: Secondary | ICD-10-CM | POA: Diagnosis not present

## 2021-04-08 DIAGNOSIS — G2581 Restless legs syndrome: Secondary | ICD-10-CM

## 2021-04-08 LAB — URINALYSIS, COMPLETE
Bilirubin, UA: NEGATIVE
Glucose, UA: NEGATIVE
Ketones, UA: NEGATIVE
Nitrite, UA: POSITIVE — AB
Protein,UA: NEGATIVE
Specific Gravity, UA: 1.015 (ref 1.005–1.030)
Urobilinogen, Ur: 1 mg/dL (ref 0.2–1.0)
pH, UA: 7 (ref 5.0–7.5)

## 2021-04-08 LAB — MICROSCOPIC EXAMINATION
Epithelial Cells (non renal): NONE SEEN /hpf (ref 0–10)
RBC, Urine: >30 /hpf — AB (ref 0–2)
WBC, UA: >30 /hpf — AB (ref 0–5)

## 2021-04-08 MED ORDER — ALPRAZOLAM 0.25 MG PO TABS: 0.2500 mg | ORAL_TABLET | Freq: Two times a day (BID) | ORAL | 5 refills | Status: DC | PRN

## 2021-04-08 MED ORDER — ROPINIROLE HCL 0.25 MG PO TABS: 0.2500 mg | ORAL_TABLET | Freq: Three times a day (TID) | ORAL | 1 refills | Status: DC

## 2021-04-08 MED ORDER — GABAPENTIN 100 MG PO CAPS: 100.0000 mg | ORAL_CAPSULE | Freq: Three times a day (TID) | ORAL | 1 refills | Status: DC

## 2021-04-08 MED ORDER — BENAZEPRIL-HYDROCHLOROTHIAZIDE 20-12.5 MG PO TABS: 0.5000 | ORAL_TABLET | Freq: Every day | ORAL | 1 refills | Status: DC

## 2021-04-08 MED ORDER — LEVOTHYROXINE SODIUM 75 MCG PO TABS
75.0000 ug | ORAL_TABLET | Freq: Every day | ORAL | 1 refills | Status: DC
Start: 2021-04-08 — End: 2021-10-07

## 2021-04-08 MED ORDER — TRAZODONE HCL 50 MG PO TABS: 25.0000 mg | ORAL_TABLET | Freq: Every evening | ORAL | 1 refills | Status: DC | PRN

## 2021-04-08 MED ORDER — SERTRALINE HCL 50 MG PO TABS: 50.0000 mg | ORAL_TABLET | Freq: Every day | ORAL | 1 refills | Status: DC

## 2021-04-08 NOTE — Progress Notes (Signed)
Subjective:    Patient ID: Phyllis Walker, female    DOB: [birth year], 75 y.o.   MRN: [id number]  Chief Complaint: medical management of chronic issues     HPI:  1. Hypertension, benign essential, goal below 140/90 No c/o chest pain, sob or headache. Does not check blood pressure at home. BP Readings from Last 3 Encounters:  10/07/20 117/80  04/06/20 108/68  03/09/20 (!) 144/77     2. Hyperlipidemia with target LDL less than 100 Deo snot watch diet and does no dedicated exercise. Lab Results  Component Value Date   CHOL 135 10/07/2020   HDL 34 (L) 10/07/2020   LDLCALC 71 10/07/2020   TRIG 175 (H) 10/07/2020   CHOLHDL 4.0 10/07/2020     3. Acquired hypothyroidism No preoblem sthat she is aware of. Lab Results  Component Value Date   TSH 3.170 10/07/2020     4. Malignant neoplasm of upper-inner quadrant of right breast in female, estrogen receptor positive ([place]) Follows up yearly. Has had no reoccurence. She is still on letrazole daily.  5. Neuropathy Has in both feet, burn and tingle all the time. Neurontin helps.  6. GAD (generalized anxiety disorder) Is on xanax ,she has been taking it 2x a day. GAD 7 : Generalized Anxiety Score 04/08/2021 10/07/2020 04/06/2020 12/27/2016  Nervous, Anxious, on Edge 0 0 0 0  Control/stop worrying 0 1 0 1  Worry too much - different things 0 0 0 1  Trouble relaxing 0 0 0 0  Restless 0 0 0 0  Easily annoyed or irritable 0 0 0 0  Afraid - awful might happen 0 0 0 0  Total GAD 7 Score 0 1 0 2  Anxiety Difficulty Not difficult at all Not difficult at all Not difficult at all Not difficult at all     7. Recurrent major depressive disorder, in full remission ([place]) Has been on zoloft for quite some time now. She says she is dong well. Depression screen [facility] 2/9 04/08/2021 10/07/2020 04/06/2020  Decreased Interest 0 0 0  Down, Depressed, Hopeless 0 0 0  PHQ - 2 Score 0 0 0  Altered sleeping 0 0 0  Tired, decreased energy 0 0 0   Change in appetite 0 0 0  Feeling bad or failure about yourself  0 0 0  Trouble concentrating 0 0 0  Moving slowly or fidgety/restless 0 0 0  Suicidal thoughts 0 0 0  PHQ-9 Score 0 0 0  Difficult doing work/chores Not difficult at all Not difficult at all Not difficult at all  Some recent data might be hidden     8. Primary insomnia Is on trazadone to sleep which helps her. She sleep around 8-9 hours a night.  9. Anemia, unspecified type No c/o fatigue. Lab Results  Component Value Date   HGB 12.6 10/07/2020     10. Osteoporosis with pathological fracture of ankle and foot, unspecified laterality, with routine healing, subsequent encounter Last dexascan was done in 2015. Pateint has refused to repeat in the past. Still does not want to do.  11. Restless leg syndrome Takes mirapex nightly which really helps keep legs calm at night.  BMI 29.0-29.9,adult Weight is down 6lbs  Wt Readings from Last 3 Encounters:  04/08/21 168 lb (76.2 kg)  10/07/20 174 lb (78.9 kg)  04/06/20 174 lb (78.9 kg)   BMI Readings from Last 3 Encounters:  04/08/21 28.84 kg/m  10/07/20 29.87 kg/m  04/06/20 29.87 kg/m  Outpatient Encounter Medications as of 04/08/2021  Medication Sig   ALPRAZolam (XANAX) 0.25 MG tablet Take 1 tablet (0.25 mg total) by mouth 2 (two) times daily as needed for anxiety.   benazepril-hydrochlorthiazide (LOTENSIN HCT) 20-12.5 MG tablet Take 0.5 tablets by mouth daily.   gabapentin (NEURONTIN) 100 MG capsule Take 1 capsule (100 mg total) by mouth 3 (three) times daily.   letrozole (FEMARA) 2.5 MG tablet TAKE 1 TABLET DAILY   levothyroxine (SYNTHROID) 75 MCG tablet Take 1 tablet (75 mcg total) by mouth daily.   Omega-3 Fatty Acids (FISH OIL) 1200 MG CAPS Take 1,200 mg by mouth daily.   rOPINIRole (REQUIP) 0.25 MG tablet Take 1 tablet (0.25 mg total) by mouth 3 (three) times daily.   sertraline (ZOLOFT) 50 MG tablet Take 1 tablet (50 mg total) by mouth daily.    traZODone (DESYREL) 50 MG tablet Take 0.5-1 tablets (25-50 mg total) by mouth at bedtime as needed for sleep.   Facility-Administered Encounter Medications as of 04/08/2021  Medication   cyanocobalamin ((VITAMIN B-12)) injection 1,000 mcg    Past Surgical History:  Procedure Laterality Date   BREAST LUMPECTOMY  1987   benign fibroid tumor   BREAST LUMPECTOMY WITH RADIOACTIVE SEED AND SENTINEL LYMPH NODE BIOPSY Right 07/05/2018   Procedure: RIGHT BREAST LUMPECTOMY WITH RADIOACTIVE SEED X 2 AND RIGHT AXILLARY SENTINEL LYMPH NODE BIOPSY;  Surgeon: Alphonsa Overall, MD;  Location: [facility];  Service: General;  Laterality: Right;   BUNIONECTOMY Bilateral    CATARACT EXTRACTION     COLONOSCOPY     THYROIDECTOMY, PARTIAL  1969   TONSILLECTOMY     TOTAL KNEE ARTHROPLASTY Right 08/15/2016   Procedure: TOTAL KNEE ARTHROPLASTY;  Surgeon: Vickey Huger, MD;  Location: [facility];  Service: Orthopedics;  Laterality: Right;  RNFA    Family History  Problem Relation Age of Onset   Cancer Mother        pancreas   Heart attack Father    Hypertension Sister    Hyperlipidemia Sister    Anuerysm Sister    Hypertension Sister    Hyperlipidemia Sister    Early death Sister 61       accident   Ovarian cancer Maternal Aunt     New complaints: None today  Social history: Has recently remarried  Controlled substance contract: 10/13/20     Review of Systems  Constitutional:  Negative for diaphoresis.  Eyes:  Negative for pain.  Respiratory:  Negative for shortness of breath.   Cardiovascular:  Negative for chest pain, palpitations and leg swelling.  Gastrointestinal:  Negative for abdominal pain.  Endocrine: Negative for polydipsia.  Skin:  Negative for rash.  Neurological:  Negative for dizziness, weakness and headaches.  Hematological:  Does not bruise/bleed easily.  All other systems reviewed and are negative.     Objective:   Physical Exam Vitals and nursing note reviewed.   Constitutional:      General: She is not in acute distress.    Appearance: Normal appearance. She is well-developed.  HENT:     Head: Normocephalic.     Right Ear: Tympanic membrane normal.     Left Ear: Tympanic membrane normal.     Nose: Nose normal.     Mouth/Throat:     Mouth: Mucous membranes are moist.  Eyes:     Pupils: Pupils are equal, round, and reactive to light.  Neck:     Vascular: No carotid bruit or JVD.  Cardiovascular:  Rate and Rhythm: Normal rate and regular rhythm.     Heart sounds: Normal heart sounds.  Pulmonary:     Effort: Pulmonary effort is normal. No respiratory distress.     Breath sounds: Normal breath sounds. No wheezing or rales.  Chest:     Chest wall: No tenderness.  Abdominal:     General: Bowel sounds are normal. There is no distension or abdominal bruit.     Palpations: Abdomen is soft. There is no hepatomegaly, splenomegaly, mass or pulsatile mass.     Tenderness: There is no abdominal tenderness.  Musculoskeletal:        General: Normal range of motion.     Cervical back: Normal range of motion and neck supple.  Lymphadenopathy:     Cervical: No cervical adenopathy.  Skin:    General: Skin is warm and dry.  Neurological:     Mental Status: She is alert and oriented to person, place, and time.     Deep Tendon Reflexes: Reflexes are normal and symmetric.  Psychiatric:        Behavior: Behavior normal.        Thought Content: Thought content normal.        Judgment: Judgment normal.   BP 118/70   Pulse 95   Temp (!) 97.2 F (36.2 C) (Temporal)   Resp 20   Ht '5\' 4"'  (1.626 m)   Wt 168 lb (76.2 kg)   SpO2 94%   BMI 28.84 kg/m       Assessment & Plan:  Phyllis Walker comes in today with chief complaint of Medical Management of Chronic Issues   Diagnosis and orders addressed:  1. Hypertension, benign essential, goal below 140/90 Low sodium diet - benazepril-hydrochlorthiazide (LOTENSIN HCT) 20-12.5 MG tablet; Take 0.5  tablets by mouth daily.  Dispense: 90 tablet; Refill: 1 - CBC with Differential/Platelet - CMP14+EGFR  2. Hyperlipidemia with target LDL less than 100 Low fat diet - Lipid panel  3. Acquired hypothyroidism Labs pending - levothyroxine (SYNTHROID) 75 MCG tablet; Take 1 tablet (75 mcg total) by mouth daily.  Dispense: 90 tablet; Refill: 1 - Thyroid Panel With TSH  4. Malignant neoplasm of upper-inner quadrant of right breast in female, estrogen receptor positive ([place]) Keep follow up with oncology  5. Neuropathy Do not go bare footed - gabapentin (NEURONTIN) 100 MG capsule; Take 1 capsule (100 mg total) by mouth 3 (three) times daily.  Dispense: 270 capsule; Refill: 1  6. GAD (generalized anxiety disorder) Stress manaegement - ALPRAZolam (XANAX) 0.25 MG tablet; Take 1 tablet (0.25 mg total) by mouth 2 (two) times daily as needed for anxiety.  Dispense: 60 tablet; Refill: 5  7. Recurrent major depressive disorder, in full remission ([place]) - sertraline (ZOLOFT) 50 MG tablet; Take 1 tablet (50 mg total) by mouth daily.  Dispense: 90 tablet; Refill: 1  8. Primary insomnia Bedtime routine - traZODone (DESYREL) 50 MG tablet; Take 0.5-1 tablets (25-50 mg total) by mouth at bedtime as needed for sleep.  Dispense: 90 tablet; Refill: 1  9. Anemia, unspecified type Labs pending  10. Osteoporosis with pathological fracture of ankle and foot, unspecified laterality, with routine healing, subsequent encounter Weight bearing exercise  11. Restless leg syndrome Keep legs warm - rOPINIRole (REQUIP) 0.25 MG tablet; Take 1 tablet (0.25 mg total) by mouth 3 (three) times daily.  Dispense: 270 tablet; Refill: 1  12. BMI 28.0-28.9,adult Discussed diet and exercise for person with BMI >25 Will recheck weight in 3-6 months  Labs pending Health Maintenance reviewed Diet and exercise encouraged  Follow up plan: 6 months   Mary-Margaret Hassell Done, FNP

## 2021-04-08 NOTE — Addendum Note (Signed)
Addended by: Rolena Infante on: 04/08/2021 08:49 AM   Modules accepted: Orders

## 2021-04-08 NOTE — Patient Instructions (Signed)
Restless Legs Syndrome Restless legs syndrome is a condition that causes uncomfortable feelings or sensations in the legs, especially while sitting or lying down. The sensations usually cause an overwhelming urge to move the legs. The arms can alsosometimes be affected. The condition can range from mild to severe. The symptoms often interfere witha person's ability to sleep. What are the causes? The cause of this condition is not known. What increases the risk? The following factors may make you more likely to develop this condition: Being older than 50. Pregnancy. Being a woman. In general, the condition is more common in women than in men. A family history of the condition. Having iron deficiency. Overuse of caffeine, nicotine, or alcohol. Certain medical conditions, such as kidney disease, Parkinson's disease, or nerve damage. Certain medicines, such as those for high blood pressure, nausea, colds, allergies, depression, and some heart conditions. What are the signs or symptoms? The main symptom of this condition is uncomfortable sensations in the legs, such as: Pulling. Tingling. Prickling. Throbbing. Crawling. Burning. Usually, the sensations: Affect both sides of the body. Are worse when you sit or lie down. Are worse at night. These may wake you up or make it difficult to fall asleep. Make you have a strong urge to move your legs. Are temporarily relieved by moving your legs. The arms can also be affected, but this is rare. People who have this conditionoften have tiredness during the day because of their lack of sleep at night. How is this diagnosed? This condition may be diagnosed based on: Your symptoms. Blood tests. In some cases, you may be monitored in a sleep lab by a specialist (a sleep study). This can detect any disruptions in your sleep. How is this treated? This condition is treated by managing the symptoms. This may include: Lifestyle changes, such as  exercising, using relaxation techniques, and avoiding caffeine, alcohol, or tobacco. Medicines. Anti-seizure medicines may be tried first. Follow these instructions at home: General instructions Take over-the-counter and prescription medicines only as told by your health care provider. Use methods to help relieve the uncomfortable sensations, such as: Massaging your legs. Walking or stretching. Taking a cold or hot bath. Keep all follow-up visits as told by your health care provider. This is important. Lifestyle     Practice good sleep habits. For example, go to bed and get up at the same time every day. Most adults should get 7-9 hours of sleep each night. Exercise regularly. Try to get at least 30 minutes of exercise most days of the week. Practice ways of relaxing, such as yoga or meditation. Avoid caffeine and alcohol. Do not use any products that contain nicotine or tobacco, such as cigarettes and e-cigarettes. If you need help quitting, ask your health care provider. Contact a health care provider if: Your symptoms get worse or they do not improve with treatment. Summary Restless legs syndrome is a condition that causes uncomfortable feelings or sensations in the legs, especially while sitting or lying down. The symptoms often interfere with a person's ability to sleep. This condition is treated by managing the symptoms. You may need to make lifestyle changes or take medicines. This information is not intended to replace advice given to you by your health care provider. Make sure you discuss any questions you have with your healthcare provider. Document Revised: 10/18/2019 Document Reviewed: 09/18/2017 Elsevier Patient Education  2022 Elsevier Inc.  

## 2021-04-08 NOTE — Addendum Note (Signed)
Addended by: Rolena Infante on: 04/08/2021 08:56 AM   Modules accepted: Orders

## 2021-04-09 LAB — CMP14+EGFR
ALT: 5 IU/L (ref 0–32)
AST: 15 IU/L (ref 0–40)
Albumin/Globulin Ratio: 1.6 (ref 1.2–2.2)
Albumin: 3.8 g/dL (ref 3.7–4.7)
Alkaline Phosphatase: 104 IU/L (ref 44–121)
BUN/Creatinine Ratio: 17 (ref 12–28)
BUN: 12 mg/dL (ref 8–27)
Bilirubin Total: 0.3 mg/dL (ref 0.0–1.2)
CO2: 27 mmol/L (ref 20–29)
Calcium: 9 mg/dL (ref 8.7–10.3)
Chloride: 102 mmol/L (ref 96–106)
Creatinine, Ser: 0.71 mg/dL (ref 0.57–1.00)
Globulin, Total: 2.4 g/dL (ref 1.5–4.5)
Glucose: 86 mg/dL (ref 65–99)
Potassium: 4.2 mmol/L (ref 3.5–5.2)
Sodium: 143 mmol/L (ref 134–144)
Total Protein: 6.2 g/dL (ref 6.0–8.5)
eGFR: 89 mL/min/{1.73_m2} (ref >59)

## 2021-04-09 LAB — CBC WITH DIFFERENTIAL/PLATELET
Basophils Absolute: 0.1 10*3/uL (ref 0.0–0.2)
Basos: 2 %
EOS (ABSOLUTE): 0.4 10*3/uL (ref 0.0–0.4)
Eos: 6 %
Hematocrit: 36.5 % (ref 34.0–46.6)
Hemoglobin: 11.7 g/dL (ref 11.1–15.9)
Immature Grans (Abs): 0 10*3/uL (ref 0.0–0.1)
Immature Granulocytes: 0 %
Lymphocytes Absolute: 1.7 10*3/uL (ref 0.7–3.1)
Lymphs: 26 %
MCH: 28.1 pg (ref 26.6–33.0)
MCHC: 32.1 g/dL (ref 31.5–35.7)
MCV: 88 fL (ref 79–97)
Monocytes Absolute: 0.5 10*3/uL (ref 0.1–0.9)
Monocytes: 7 %
Neutrophils Absolute: 3.9 10*3/uL (ref 1.4–7.0)
Neutrophils: 59 %
Platelets: 208 10*3/uL (ref 150–450)
RBC: 4.17 x10E6/uL (ref 3.77–5.28)
RDW: 12.7 % (ref 11.7–15.4)
WBC: 6.5 10*3/uL (ref 3.4–10.8)

## 2021-04-09 LAB — THYROID PANEL WITH TSH
Free Thyroxine Index: 2.3 (ref 1.2–4.9)
T3 Uptake Ratio: 22 % — ABNORMAL LOW (ref 24–39)
T4, Total: 10.3 ug/dL (ref 4.5–12.0)
TSH: 1.49 u[IU]/mL (ref 0.450–4.500)

## 2021-04-09 LAB — LIPID PANEL
Chol/HDL Ratio: 3.9 ratio (ref 0.0–4.4)
Cholesterol, Total: 221 mg/dL — ABNORMAL HIGH (ref 100–199)
HDL: 56 mg/dL (ref >39)
LDL Chol Calc (NIH): 148 mg/dL — ABNORMAL HIGH (ref 0–99)
Triglycerides: 96 mg/dL (ref 0–149)
VLDL Cholesterol Cal: 17 mg/dL (ref 5–40)

## 2021-04-09 MED ORDER — SULFAMETHOXAZOLE-TRIMETHOPRIM 800-160 MG PO TABS: 1.0000 | ORAL_TABLET | Freq: Two times a day (BID) | ORAL | 0 refills | Status: DC

## 2021-04-09 NOTE — Addendum Note (Signed)
Addended by: Chevis Pretty on: 04/09/2021 01:26 PM   Modules accepted: Orders

## 2021-04-12 LAB — URINE CULTURE

## 2021-04-16 ENCOUNTER — Ambulatory Visit (INDEPENDENT_AMBULATORY_CARE_PROVIDER_SITE_OTHER): Payer: Medicare HMO

## 2021-04-16 ENCOUNTER — Other Ambulatory Visit: Payer: Self-pay

## 2021-04-16 DIAGNOSIS — E538 Deficiency of other specified B group vitamins: Secondary | ICD-10-CM | POA: Diagnosis not present

## 2021-04-16 NOTE — Progress Notes (Signed)
Cyanocobalamin injection given to left deltoid.  Patient tolerated well. 

## 2021-05-18 ENCOUNTER — Other Ambulatory Visit: Payer: Self-pay

## 2021-05-18 ENCOUNTER — Ambulatory Visit (INDEPENDENT_AMBULATORY_CARE_PROVIDER_SITE_OTHER): Payer: Medicare HMO | Admitting: *Deleted

## 2021-05-18 DIAGNOSIS — E538 Deficiency of other specified B group vitamins: Secondary | ICD-10-CM | POA: Diagnosis not present

## 2021-05-18 NOTE — Progress Notes (Signed)
Pt given B12 injection IM left deltoid and tolerated well. °

## 2021-05-20 ENCOUNTER — Ambulatory Visit (INDEPENDENT_AMBULATORY_CARE_PROVIDER_SITE_OTHER): Payer: Medicare HMO | Admitting: Pharmacist

## 2021-05-20 ENCOUNTER — Other Ambulatory Visit: Payer: Self-pay

## 2021-05-20 DIAGNOSIS — E785 Hyperlipidemia, unspecified: Secondary | ICD-10-CM | POA: Diagnosis not present

## 2021-05-20 MED ORDER — EZETIMIBE 10 MG PO TABS: 10.0000 mg | ORAL_TABLET | Freq: Every day | ORAL | 3 refills | Status: DC

## 2021-05-20 NOTE — Progress Notes (Signed)
    05/20/2021 Name: Phyllis Walker MRN: [id number] DOB: 08/13/1946  HPI:  Phyllis Walker is a 75 y.o. female patient referred to lipid clinic by PCP. 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 60 tablet 5   benazepril-hydrochlorthiazide (LOTENSIN HCT) 20-12.5 MG tablet Take 0.5 tablets by mouth daily. 90 tablet 1   gabapentin (NEURONTIN) 100 MG capsule Take 1 capsule (100 mg total) by mouth 3 (three) times daily. 270 capsule 1   letrozole (FEMARA) 2.5 MG tablet TAKE 1 TABLET DAILY 90 tablet 3   levothyroxine (SYNTHROID) 75 MCG tablet Take 1 tablet (75 mcg total) by mouth daily. 90 tablet 1   rOPINIRole (REQUIP) 0.25 MG tablet Take 1 tablet (0.25 mg total) by mouth 3 (three) times daily. 270 tablet 1   sertraline (ZOLOFT) 50 MG tablet Take 1 tablet (50 mg total) by mouth daily. 90 tablet 1   traZODone (DESYREL) 50 MG tablet Take 0.5-1 tablets (25-50 mg total) by mouth at bedtime as needed for sleep. 90 tablet 1    Intolerances: STATINS  Risk Factors: HTN  LDL goal: <100  Diet: handout provided on dietary recommendations in patient with hyperlipidemia   Exercise: walks  Family History: HLD (parents)  Social History: FORMER smoker (80 PACK YEAR HISTORY)/no alcohol   Labs: Lipid Panel     Component Value Date/Time   CHOL 221 (H) 04/08/2021 0845   CHOL 183 12/21/2012 1056   TRIG 96 04/08/2021 0845   TRIG 97  11/20/2014 1128   TRIG 116 12/21/2012 1056   HDL 56 04/08/2021 0845   HDL 57 11/20/2014 1128   HDL 55 12/21/2012 1056   CHOLHDL 3.9 04/08/2021 0845   LDLCALC 148 (H) 04/08/2021 0845   LDLCALC 88 05/12/2014 0933   LDLCALC 105 (H) 12/21/2012 1056   LABVLDL 17 04/08/2021 0845   Liver Function Tests Hepatic Function Latest Ref Rng & Units 04/08/2021 10/07/2020 04/06/2020  Total Protein 6.0 - 8.5 g/dL 6.2 6.4 6.3  Albumin 3.7 - 4.7 g/dL 3.8 4.0 3.9  AST 0 - 40 IU/L $Remov'15 16 17  'nIWpDf$ ALT 0 - 32 IU/L $Remov'5 14 9  'ndDQCx$ Alk Phosphatase 44 - 121 IU/L 104 67 95  Total Bilirubin 0.0 - 1.2 mg/dL 0.3 0.7 0.4    Assessment/Plan:     1. Hyperlipidemia - Patient refuses statin (including alternative dosing), injectables, etc--she would like to improve diet/lifestyle to aid in lipid lowering Patient agreeable to START ZETIA $RemoveBef'10mg'WvPDJvwtkY$  daily

## 2021-06-17 ENCOUNTER — Ambulatory Visit (INDEPENDENT_AMBULATORY_CARE_PROVIDER_SITE_OTHER): Payer: Medicare HMO

## 2021-06-17 VITALS — Ht 64.0 in | Wt 166.0 lb

## 2021-06-17 DIAGNOSIS — Z0000 Encounter for general adult medical examination without abnormal findings: Secondary | ICD-10-CM

## 2021-06-17 NOTE — Progress Notes (Signed)
Subjective:   Phyllis Walker is a 75 y.o. female who presents for Medicare Annual (Subsequent) preventive examination.  Virtual Visit via Telephone Note  I connected with  Phyllis Walker on 06/17/21 at 10:30 AM EDT by telephone and verified that I am speaking with the correct person using two identifiers.  Location: Patient: Home Provider: [facility] Persons participating in the virtual visit: patient/Nurse Health Advisor   I discussed the limitations, risks, security and privacy concerns of performing an evaluation and management service by telephone and the availability of in person appointments. The patient expressed understanding and agreed to proceed.  Interactive audio and video telecommunications were attempted between this nurse and patient, however failed, due to patient having technical difficulties OR patient did not have access to video capability.  We continued and completed visit with audio only.  Some vital signs may be absent or patient reported.   Chanise Habeck E Chante Mayson, LPN   Review of Systems     Cardiac Risk Factors include: advanced age (>51men, >28 women);dyslipidemia;sedentary lifestyle;Other (see comment), Risk factor comments: atherosclerosis, emphysema     Objective:    Today's Vitals   06/17/21 1031  Weight: 166 lb (75.3 kg)  Height: 5\' 4"  (1.626 m)   Body mass index is 28.49 kg/m.  Advanced Directives 06/17/2021 03/26/2019 07/05/2018 07/02/2018 06/12/2018 01/02/2018 12/29/2016  Does Patient Have a Medical Advance Directive? Yes No No No Yes Yes No  Type of Paramedic of [place];Living will - - - Healthcare Power of [place] will -  Copy of [place] in Chart? No - copy requested - - - No - copy requested - -  Would patient like information on creating a medical advance directive? - No - Patient declined No - Patient declined No - Patient declined No - Patient declined - Yes (MAU/Ambulatory/Procedural Areas -  Information given)    Current Medications (verified) Outpatient Encounter Medications as of 06/17/2021  Medication Sig   ALPRAZolam (XANAX) 0.25 MG tablet Take 1 tablet (0.25 mg total) by mouth 2 (two) times daily as needed for anxiety.   benazepril-hydrochlorthiazide (LOTENSIN HCT) 20-12.5 MG tablet Take 0.5 tablets by mouth daily.   ezetimibe (ZETIA) 10 MG tablet Take 1 tablet (10 mg total) by mouth daily.   gabapentin (NEURONTIN) 100 MG capsule Take 1 capsule (100 mg total) by mouth 3 (three) times daily.   letrozole (FEMARA) 2.5 MG tablet TAKE 1 TABLET DAILY   levothyroxine (SYNTHROID) 75 MCG tablet Take 1 tablet (75 mcg total) by mouth daily.   rOPINIRole (REQUIP) 0.25 MG tablet Take 1 tablet (0.25 mg total) by mouth 3 (three) times daily.   sertraline (ZOLOFT) 50 MG tablet Take 1 tablet (50 mg total) by mouth daily.   traZODone (DESYREL) 50 MG tablet Take 0.5-1 tablets (25-50 mg total) by mouth at bedtime as needed for sleep.   Facility-Administered Encounter Medications as of 06/17/2021  Medication   cyanocobalamin ((VITAMIN B-12)) injection 1,000 mcg    Allergies (verified) Amoxicillin-pot clavulanate   History: Past Medical History:  Diagnosis Date   Anemia    Anxiety    Arthritis    Breast cancer, right breast ([facility]) 06/2018   Cataract    Depression    Fibroid tumor 1985   in breast   History of hiatal hernia    Hypercholesteremia    Hypertension    Hypothyroidism    Neuropathy    in feet   Restless leg syndrome    Sclerosing  adenosis of right breast 06/2018   Past Surgical History:  Procedure Laterality Date   BREAST LUMPECTOMY  1987   benign fibroid tumor   BREAST LUMPECTOMY WITH RADIOACTIVE SEED AND SENTINEL LYMPH NODE BIOPSY Right 07/05/2018   Procedure: RIGHT BREAST LUMPECTOMY WITH RADIOACTIVE SEED X 2 AND RIGHT AXILLARY SENTINEL LYMPH NODE BIOPSY;  Surgeon: Alphonsa Overall, MD;  Location: [facility];  Service: General;  Laterality: Right;    BUNIONECTOMY Bilateral    CATARACT EXTRACTION     COLONOSCOPY     THYROIDECTOMY, PARTIAL  1969   TONSILLECTOMY     TOTAL KNEE ARTHROPLASTY Right 08/15/2016   Procedure: TOTAL KNEE ARTHROPLASTY;  Surgeon: Vickey Huger, MD;  Location: [facility];  Service: Orthopedics;  Laterality: Right;  RNFA   Family History  Problem Relation Age of Onset   Cancer Mother        pancreas   Heart attack Father    Hypertension Sister    Hyperlipidemia Sister    Anuerysm Sister    Hypertension Sister    Hyperlipidemia Sister    Early death Sister 58       accident   Ovarian cancer Maternal Aunt    Social History   Socioeconomic History   Marital status: Married    Spouse name: Not on file   Number of children: 2   Years of education: 12   Highest education level: High school graduate  Occupational History   Occupation: retired  Tobacco Use   Smoking status: Former    Packs/day: 2.00    Years: 40.00    Pack years: 80.00    Types: Cigarettes    Quit date: 12/21/2006    Years since quitting: 14.4   Smokeless tobacco: Never  Vaping Use   Vaping Use: Never used  Substance and Sexual Activity   Alcohol use: No   Drug use: No   Sexual activity: Not Currently  Other Topics Concern   Not on file  Social History Narrative   Lives with her husband has one cat   One child in [place] and one in [place] Resource Strain: Low Risk    Difficulty of Paying Living Expenses: Not hard at all  Food Insecurity: No Food Insecurity   Worried About Charity fundraiser in the Last Year: Never true   Arboriculturist in the Last Year: Never true  Transportation Needs: No Transportation Needs   Lack of Transportation (Medical): No   Lack of Transportation (Non-Medical): No  Physical Activity: Inactive   Days of Exercise per Week: 0 days   Minutes of Exercise per Session: 0 min  Stress: No Stress Concern Present   Feeling of Stress : Not at all  Social Connections:  Socially Integrated   Frequency of Communication with Friends and Family: More than three times a week   Frequency of Social Gatherings with Friends and Family: More than three times a week   Attends Religious Services: More than 4 times per year   Active Member of [employer] or Organizations: Yes   Attends Music therapist: More than 4 times per year   Marital Status: Married    Tobacco Counseling Counseling given: Not Answered   Clinical Intake:  Pre-visit preparation completed: Yes  Pain : No/denies pain     BMI - recorded: 28.49 Nutritional Status: BMI 25 -29 Overweight Nutritional Risks: None Diabetes: No  How often do you need to  have someone help you when you read instructions, pamphlets, or other written materials from your doctor or pharmacy?: 1 - Never  Diabetic? No  Interpreter Needed?: No  Information entered by :: Curlee Bogan, LPN   Activities of Daily Living In your present state of health, do you have any difficulty performing the following activities: 06/17/2021  Hearing? N  Vision? N  Difficulty concentrating or making decisions? N  Walking or climbing stairs? Y  Comment hurts knees  Dressing or bathing? N  Doing errands, shopping? N  Preparing Food and eating ? N  Using the Toilet? N  In the past six months, have you accidently leaked urine? Y  Comment wears pads for protection  Do you have problems with loss of bowel control? N  Managing your Medications? N  Managing your Finances? N  Housekeeping or managing your Housekeeping? N  Some recent data might be hidden    Patient Care Team: Chevis Pretty, FNP as PCP - General (Nurse Practitioner) Vickey Huger, MD as Consulting Physician (Orthopedic Surgery) Huel Cote, NP (Inactive) as Nurse Practitioner (Obstetrics and Gynecology) Nicholas Lose, MD as Consulting Physician (Hematology and Oncology) Alphonsa Overall, MD as Consulting Physician (General Surgery) Kyung Rudd, MD  as Consulting Physician (Radiation Oncology) Lavera Guise, [facility] as Pharmacist (Family Medicine)  Indicate any recent Medical Services you may have received from other than [facility] providers in the past year (date may be approximate).     Assessment:   This is a routine wellness examination for [place].  Hearing/Vision screen Hearing Screening - Comments:: Denies hearing difficulties  Vision Screening - Comments:: No vision difficulties - no eye doctor since cataract surgery  Dietary issues and exercise activities discussed: Current Exercise Habits: The patient does not participate in regular exercise at present, Exercise limited by: orthopedic condition(s);neurologic condition(s)   Goals Addressed             This Visit's Progress    DIET - INCREASE WATER INTAKE   On track    Try to drink 6-8 glasses of water daily.     Exercise 3x per week (30 min per time)   Not on track      Depression Screen PHQ 2/9 Scores 06/17/2021 04/08/2021 10/07/2020 04/06/2020 03/09/2020 10/08/2019 05/02/2019  PHQ - 2 Score 0 0 0 0 0 0 0  PHQ- 9 Score - 0 0 0 - - -    Fall Risk Fall Risk  06/17/2021 04/08/2021 10/07/2020 04/06/2020 03/09/2020  Falls in the past year? 0 0 0 0 0  Number falls in past yr: 0 - - - -  Injury with Fall? 0 - - - -  Risk for fall due to : Orthopedic patient;Medication side effect;Other (Comment) - - - -  Risk for fall due to: Comment neuropathy - - - -  Follow up Falls prevention discussed - - - -    FALL RISK PREVENTION PERTAINING TO THE HOME:  Any stairs in or around the home? No  If so, are there any without handrails? No  Home free of loose throw rugs in walkways, pet beds, electrical cords, etc? Yes  Adequate lighting in your home to reduce risk of falls? Yes   ASSISTIVE DEVICES UTILIZED TO PREVENT FALLS:  Life alert? No  Use of a cane, walker or w/c? No  Grab bars in the bathroom? Yes  Shower chair or bench in shower? Yes  Elevated toilet seat or a handicapped  toilet? Yes   TIMED UP AND  GO:  Was the test performed? No . Telephonic visit  Cognitive Function: Normal cognitive status assessed by direct observation by this Nurse Health Advisor. No abnormalities found.   MMSE - Mini Mental State Exam 01/02/2018 12/29/2016 05/27/2015  Orientation to time 5 5 5   Orientation to Place 5 5 5   Registration 3 3 3   Attention/ Calculation 5 5 5   Recall 3 3 3   Language- name 2 objects 2 2 2   Language- repeat 1 1 1   Language- follow 3 step command 3 3 3   Language- read & follow direction 1 1 1   Write a sentence 1 1 1   Copy design 1 1 1   Total score 30 30 30      6CIT Screen 03/26/2019  What Year? 0 points  What month? 0 points  What time? 0 points  Count back from 20 0 points  Months in reverse 0 points  Repeat phrase 0 points  Total Score 0    Immunizations Immunization History  Administered Date(s) Administered   Fluad Quad(high Dose 65+) 06/03/2019, 06/09/2020, 06/07/2021   Influenza Split 06/18/2013   Influenza,inj,Quad PF,6+ Mos 06/09/2014, 06/19/2015, 06/10/2017, 07/12/2018   Influenza-Unspecified 06/20/2016   Moderna Sars-Covid-2 Vaccination 10/08/2019, 11/05/2019, 08/11/2020   Pneumococcal Conjugate-13 11/20/2014   Pneumococcal Polysaccharide-23 09/12/2010   Tdap 12/29/2016   Zoster Recombinat (Shingrix) 04/08/2021   Zoster, Live 05/24/2013    TDAP status: Up to date  Flu Vaccine status: Up to date  Pneumococcal vaccine status: Up to date  Covid-19 vaccine status: Completed vaccines  Qualifies for Shingles Vaccine? Yes   Zostavax completed Yes   Shingrix Completed?: No.    Education has been provided regarding the importance of this vaccine. Patient has been advised to call insurance company to determine out of pocket expense if they have not yet received this vaccine. Advised may also receive vaccine at local pharmacy or Health Dept. Verbalized acceptance and understanding.  Screening Tests Health Maintenance  Topic Date  Due   DEXA SCAN  05/22/2016   COVID-19 Vaccine (4 - Booster for Moderna series) 11/03/2020   Zoster Vaccines- Shingrix (2 of 2) 06/03/2021   MAMMOGRAM  07/15/2021   COLONOSCOPY (Pts 45-55yrs Insurance coverage will need to be confirmed)  05/17/2022   TETANUS/TDAP  12/30/2026   INFLUENZA VACCINE  Completed   Hepatitis C Screening  Completed   HPV VACCINES  Aged Out    Health Maintenance  Health Maintenance Due  Topic Date Due   DEXA SCAN  05/22/2016   COVID-19 Vaccine (4 - Booster for Moderna series) 11/03/2020   Zoster Vaccines- Shingrix (2 of 2) 06/03/2021    Colorectal cancer screening: Type of screening: Colonoscopy. Completed 05/17/2017. Repeat every 5 years  Mammogram status: Completed 07/15/2020. Repeat every year  Bone Density status: Completed 05/22/2014. Results reflect: Bone density results: OSTEOPOROSIS. Repeat every 2 years.  Lung Cancer Screening: (Low Dose CT Chest recommended if Age 46-80 years, 30 pack-year currently smoking OR have quit w/in 15years.) does not qualify.  Additional Screening:  Hepatitis C Screening: does qualify; Completed 05/27/2015  Vision Screening: Recommended annual ophthalmology exams for early detection of glaucoma and other disorders of the eye. Is the patient up to date with their annual eye exam?  No  Who is the provider or what is the name of the office in which the patient attends annual eye exams? none If pt is not established with a provider, would they like to be referred to a provider to establish care? No .  Dental Screening: Recommended annual dental exams for proper oral hygiene  Community Resource Referral / Chronic Care Management: CRR required this visit?  No   CCM required this visit?  No      Plan:     I have personally reviewed and noted the following in the patient's chart:   Medical and social history Use of alcohol, tobacco or illicit drugs  Current medications and supplements including opioid  prescriptions.  Functional ability and status Nutritional status Physical activity Advanced directives List of other physicians Hospitalizations, surgeries, and ER visits in previous 12 months Vitals Screenings to include cognitive, depression, and falls Referrals and appointments  In addition, I have reviewed and discussed with patient certain preventive protocols, quality metrics, and best practice recommendations. A written personalized care plan for preventive services as well as general preventive health recommendations were provided to patient.     Sandrea Hammond, LPN   50/01/3975   Nurse Notes: None

## 2021-06-17 NOTE — Patient Instructions (Signed)
[name] , Thank you for taking time to come for your Medicare Wellness Visit. I appreciate your ongoing commitment to your health goals. Please review the following plan we discussed and let me know if I can assist you in the future.   Screening recommendations/referrals: Colonoscopy: Done 05/17/2017 - Repeat in 5 years Mammogram: Done 07/15/2020 - Make appointment with [facility] in [place] for next month (annually) Bone Density: Done 05/22/2014 - Repeat every 2 years *past due Recommended yearly ophthalmology/optometry visit for glaucoma screening and checkup Recommended yearly dental visit for hygiene and checkup  Vaccinations: Influenza vaccine: Done 06/07/2021 - Repeat annually Pneumococcal vaccine: Done 09/12/2010 & 11/20/2014 Tdap vaccine: Done 12/29/2016 - Repeat in 10 years Shingles vaccine: Done 04/08/2021 - due for second dose   Covid-19: Done 10/08/2019, 11/05/2019, & 08/11/2020 - Keep appointment for second booster 06/29/21  Advanced directives: Please bring a copy of your health care power of attorney and living will to the office to be added to your chart at your convenience.   Conditions/risks identified: Aim for 30 minutes of exercise or brisk walking each day, drink 6-8 glasses of water and eat lots of fruits and vegetables.   Next appointment: Follow up in one year for your annual wellness visit    Preventive Care 76 Years and Older, Female Preventive care refers to lifestyle choices and visits with your health care provider that can promote health and wellness. What does preventive care include? A yearly physical exam. This is also called an annual well check. Dental exams once or twice a year. Routine eye exams. Ask your health care provider how often you should have your eyes checked. Personal lifestyle choices, including: Daily care of your teeth and gums. Regular physical activity. Eating a healthy diet. Avoiding tobacco and drug use. Limiting alcohol  use. Practicing safe sex. Taking low-dose aspirin every day. Taking vitamin and mineral supplements as recommended by your health care provider. What happens during an annual well check? The services and screenings done by your health care provider during your annual well check will depend on your age, overall health, lifestyle risk factors, and family history of disease. Counseling  Your health care provider may ask you questions about your: Alcohol use. Tobacco use. Drug use. Emotional well-being. Home and relationship well-being. Sexual activity. Eating habits. History of falls. Memory and ability to understand (cognition). Work and work Statistician. Reproductive health. Screening  You may have the following tests or measurements: Height, weight, and BMI. Blood pressure. Lipid and cholesterol levels. These may be checked every 5 years, or more frequently if you are over 14 years old. Skin check. Lung cancer screening. You may have this screening every year starting at age 48 if you have a 30-pack-year history of smoking and currently smoke or have quit within the past 15 years. Fecal occult blood test (FOBT) of the stool. You may have this test every year starting at age 53. Flexible sigmoidoscopy or colonoscopy. You may have a sigmoidoscopy every 5 years or a colonoscopy every 10 years starting at age 63. Hepatitis C blood test. Hepatitis B blood test. Sexually transmitted disease (STD) testing. Diabetes screening. This is done by checking your blood sugar (glucose) after you have not eaten for a while (fasting). You may have this done every 1-3 years. Bone density scan. This is done to screen for osteoporosis. You may have this done starting at age 66. Mammogram. This may be done every 1-2 years. Talk to your health care provider about  how often you should have regular mammograms. Talk with your health care provider about your test results, treatment options, and if necessary,  the need for more tests. Vaccines  Your health care provider may recommend certain vaccines, such as: Influenza vaccine. This is recommended every year. Tetanus, diphtheria, and acellular pertussis (Tdap, Td) vaccine. You may need a Td booster every 10 years. Zoster vaccine. You may need this after age 62. Pneumococcal 13-valent conjugate (PCV13) vaccine. One dose is recommended after age 42. Pneumococcal polysaccharide (PPSV23) vaccine. One dose is recommended after age 51. Talk to your health care provider about which screenings and vaccines you need and how often you need them. This information is not intended to replace advice given to you by your health care provider. Make sure you discuss any questions you have with your health care provider. Document Released: 09/25/2015 Document Revised: 05/18/2016 Document Reviewed: 06/30/2015 Elsevier Interactive Patient Education  2017 [employer] Prevention in the Home Falls can cause injuries. They can happen to people of all ages. There are many things you can do to make your home safe and to help prevent falls. What can I do on the outside of my home? Regularly fix the edges of walkways and driveways and fix any cracks. Remove anything that might make you trip as you walk through a door, such as a raised step or threshold. Trim any bushes or trees on the path to your home. Use bright outdoor lighting. Clear any walking paths of anything that might make someone trip, such as rocks or tools. Regularly check to see if handrails are loose or broken. Make sure that both sides of any steps have handrails. Any raised decks and porches should have guardrails on the edges. Have any leaves, snow, or ice cleared regularly. Use sand or salt on walking paths during winter. Clean up any spills in your garage right away. This includes oil or grease spills. What can I do in the bathroom? Use night lights. Install grab bars by the toilet and in the  tub and shower. Do not use towel bars as grab bars. Use non-skid mats or decals in the tub or shower. If you need to sit down in the shower, use a plastic, non-slip stool. Keep the floor dry. Clean up any water that spills on the floor as soon as it happens. Remove soap buildup in the tub or shower regularly. Attach bath mats securely with double-sided non-slip rug tape. Do not have throw rugs and other things on the floor that can make you trip. What can I do in the bedroom? Use night lights. Make sure that you have a light by your bed that is easy to reach. Do not use any sheets or blankets that are too big for your bed. They should not hang down onto the floor. Have a firm chair that has side arms. You can use this for support while you get dressed. Do not have throw rugs and other things on the floor that can make you trip. What can I do in the kitchen? Clean up any spills right away. Avoid walking on wet floors. Keep items that you use a lot in easy-to-reach places. If you need to reach something above you, use a strong step stool that has a grab bar. Keep electrical cords out of the way. Do not use floor polish or wax that makes floors slippery. If you must use wax, use non-skid floor wax. Do not have throw rugs and other  things on the floor that can make you trip. What can I do with my stairs? Do not leave any items on the stairs. Make sure that there are handrails on both sides of the stairs and use them. Fix handrails that are broken or loose. Make sure that handrails are as long as the stairways. Check any carpeting to make sure that it is firmly attached to the stairs. Fix any carpet that is loose or worn. Avoid having throw rugs at the top or bottom of the stairs. If you do have throw rugs, attach them to the floor with carpet tape. Make sure that you have a light switch at the top of the stairs and the bottom of the stairs. If you do not have them, ask someone to add them for  you. What else can I do to help prevent falls? Wear shoes that: Do not have high heels. Have rubber bottoms. Are comfortable and fit you well. Are closed at the toe. Do not wear sandals. If you use a stepladder: Make sure that it is fully opened. Do not climb a closed stepladder. Make sure that both sides of the stepladder are locked into place. Ask someone to hold it for you, if possible. Clearly mark and make sure that you can see: Any grab bars or handrails. First and last steps. Where the edge of each step is. Use tools that help you move around (mobility aids) if they are needed. These include: Canes. Walkers. Scooters. Crutches. Turn on the lights when you go into a dark area. Replace any light bulbs as soon as they burn out. Set up your furniture so you have a clear path. Avoid moving your furniture around. If any of your floors are uneven, fix them. If there are any pets around you, be aware of where they are. Review your medicines with your doctor. Some medicines can make you feel dizzy. This can increase your chance of falling. Ask your doctor what other things that you can do to help prevent falls. This information is not intended to replace advice given to you by your health care provider. Make sure you discuss any questions you have with your health care provider. Document Released: 06/25/2009 Document Revised: 02/04/2016 Document Reviewed: 10/03/2014 Elsevier Interactive Patient Education  2017 [employer].

## 2021-06-18 ENCOUNTER — Ambulatory Visit (INDEPENDENT_AMBULATORY_CARE_PROVIDER_SITE_OTHER): Payer: Medicare HMO

## 2021-06-18 ENCOUNTER — Other Ambulatory Visit: Payer: Self-pay

## 2021-06-18 DIAGNOSIS — E538 Deficiency of other specified B group vitamins: Secondary | ICD-10-CM | POA: Diagnosis not present

## 2021-06-18 NOTE — Progress Notes (Signed)
Cyanocobalamin injection given to left deltoid.  Patient tolerated well. 

## 2021-06-30 ENCOUNTER — Ambulatory Visit (INDEPENDENT_AMBULATORY_CARE_PROVIDER_SITE_OTHER): Payer: Medicare HMO | Admitting: Pharmacist

## 2021-06-30 DIAGNOSIS — E785 Hyperlipidemia, unspecified: Secondary | ICD-10-CM

## 2021-06-30 NOTE — Patient Instructions (Signed)
Visit Information  PATIENT GOALS:  Goals Addressed               This Visit's Progress     Patient Stated     Hyperlipidemia-PharmD Goal (pt-stated)        Current Barriers:  Unable to achieve control of HYPERLIPIDEMIA  Suboptimal therapeutic regimen for HYPERLIPIDEMIA  Pharmacist Clinical Goal(s):  patient will verbalize ability to afford treatment regimen achieve control of HYPERLIPIDEMIA as evidenced by DECREASED LDL IN 6 MONTHS through collaboration with PharmD and provider.   Interventions: 1:1 collaboration with Chevis Pretty, FNP regarding development and update of comprehensive plan of care as evidenced by provider attestation and co-signature Inter-disciplinary care team collaboration (see longitudinal plan of care) Comprehensive medication review performed; medication list updated in electronic medical record  Hyperlipidemia:  New goal. Uncontrolled; current treatment: generic zetia 10mg  (started 1 month ago, tolerating well, affordable);  Intolerances: STATINS Risk Factors: HTN, former smoker (56 pk yr)  Family History: HLD (parents) Social History: FORMER smoker (80 PACK YEAR HISTORY)/no alcohol  LDL goal: <100 LFTs wnl Diet: handout provided on dietary recommendations in patient with hyperlipidemia  Exercise: walks Lipid Panel     Component Value Date/Time   CHOL 221 (H) 04/08/2021 0845   CHOL 183 12/21/2012 1056   TRIG 96 04/08/2021 0845   TRIG 97 11/20/2014 1128   TRIG 116 12/21/2012 1056   HDL 56 04/08/2021 0845   HDL 57 11/20/2014 1128   HDL 55 12/21/2012 1056   CHOLHDL 3.9 04/08/2021 0845   LDLCALC 148 (H) 04/08/2021 0845   LDLCALC 88 05/12/2014 0933   LDLCALC 105 (H) 12/21/2012 1056   LABVLDL 17 04/08/2021 0845  1. Hyperlipidemia - Patient refuses statin (including alternative dosing), injectables, etc--she would like to improve diet/lifestyle to aid in lipid lowering Patient agreeable to CONTINUE on ZETIA 10mg  daily Will reach out in  January to see if additional hyperlipidemia treatment is needed; can attempt to enroll in [employer] grant ( all other HLD options will be costly)   Patient Goals/Self-Care Activities patient will:  - take medications as prescribed collaborate with provider on medication access solutions  Follow Up Plan: Telephone follow up appointment with care management team member scheduled for: 09/2021         The patient verbalized understanding of instructions, educational materials, and care plan provided today and declined offer to receive copy of patient instructions, educational materials, and care plan.   Telephone follow up appointment with care management team member scheduled for: 09/2021  Signature Regina Eck, PharmD, BCPS Clinical Pharmacist, [place]  [facility] Phone [phone]

## 2021-06-30 NOTE — Progress Notes (Signed)
Chronic Care Management Pharmacy Note  06/30/2021 Name:  Phyllis Walker MRN:  [id number] DOB:  [birth year]  Summary: hyperlipidemia  Recommendations/Changes made from today's visit: Hyperlipidemia:  New goal. Uncontrolled; current treatment: generic zetia 19m (started 1 month ago, tolerating well, affordable);  Intolerances: STATINS Risk Factors: HTN, former smoker (837pk yr)  Family History: HLD (parents) Social History: FORMER smoker (80 PACK YEAR HISTORY)/no alcohol  LDL goal: <100 LFTs wnl Diet: handout provided on dietary recommendations in patient with hyperlipidemia  Exercise: walks Lipid Panel     Component Value Date/Time   CHOL 221 (H) 04/08/2021 0845   CHOL 183 12/21/2012 1056   TRIG 96 04/08/2021 0845   TRIG 97 11/20/2014 1128   TRIG 116 12/21/2012 1056   HDL 56 04/08/2021 0845   HDL 57 11/20/2014 1128   HDL 55 12/21/2012 1056   CHOLHDL 3.9 04/08/2021 0845   LDLCALC 148 (H) 04/08/2021 0845   LDLCALC 88 05/12/2014 0933   LDLCALC 105 (H) 12/21/2012 1056   LABVLDL 17 04/08/2021 0845   1. Hyperlipidemia - Patient refuses statin (including alternative dosing), injectables, etc--she would like to improve diet/lifestyle to aid in lipid lowering Patient agreeable to CONTINUE on ZETIA 142mdaily Will reach out in January to see if additional hyperlipidemia treatment is needed; can attempt to enroll in [employer] grant ( all other HLD options will be costly)  Follow Up Plan: Telephone follow up appointment with care management team member scheduled for: 09/2021  Subjective: Phyllis BARIs an [age].o. year old female who is a primary patient of [place] [place] The CCM team was consulted for assistance with disease management and care coordination needs.    Engaged with patient by telephone for initial visit in response to provider referral for pharmacy case management and/or care coordination services.   Consent to Services:  The patient was given the  following information about Chronic Care Management services today, agreed to services, and gave verbal consent: 1. CCM service includes personalized support from designated clinical staff supervised by the primary care provider, including individualized plan of care and coordination with other care providers 2. 24/7 contact phone numbers for assistance for urgent and routine care needs. 3. Service will only be billed when office clinical staff spend 20 minutes or more in a month to coordinate care. 4. Only one practitioner may furnish and bill the service in a calendar month. 5.The patient may stop CCM services at any time (effective at the end of the month) by phone call to the office staff. 6. The patient will be responsible for cost sharing (co-pay) of up to 20% of the service fee (after annual deductible is met). Patient agreed to services and consent obtained.  Patient Care Team: MaChevis PrettyFNP as PCP - General (Nurse Practitioner) LuVickey HugerMD as Consulting Physician (Orthopedic Surgery) YoHuel CoteNP (Inactive) as Nurse Practitioner (Obstetrics and Gynecology) GuNicholas LoseMD as Consulting Physician (Hematology and Oncology) NeAlphonsa OverallMD as Consulting Physician (General Surgery) MoKyung RuddMD as Consulting Physician (Radiation Oncology) PrLavera GuiseRPAmar[facility] LPs Pharmacist (Family Medicine)  Objective:  Lab Results  Component Value Date   CREATININE 0.71 04/08/2021   CREATININE 0.82 10/07/2020   CREATININE 0.79 04/06/2020    Lab Results  Component Value Date   HGBA1C 5.0 10/07/2020   Last diabetic Eye exam: No results found for: HMDIABEYEEXA  Last diabetic Foot exam: No results found for: HMDIABFOOTEX      Component  Value Date/Time   CHOL 221 (H) 04/08/2021 0845   CHOL 183 12/21/2012 1056   TRIG 96 04/08/2021 0845   TRIG 97 11/20/2014 1128   TRIG 116 12/21/2012 1056   HDL 56 04/08/2021 0845   HDL 57 11/20/2014 1128   HDL 55 12/21/2012 1056    CHOLHDL 3.9 04/08/2021 0845   LDLCALC 148 (H) 04/08/2021 0845   LDLCALC 88 05/12/2014 0933   LDLCALC 105 (H) 12/21/2012 1056    Hepatic Function Latest Ref Rng & Units 04/08/2021 10/07/2020 04/06/2020  Total Protein 6.0 - 8.5 g/dL 6.2 6.4 6.3  Albumin 3.7 - 4.7 g/dL 3.8 4.0 3.9  AST 0 - 40 IU/L _0 ALT 0 - 32 IU/L _1 Alk Phosphatase 44 - 121 IU/L 104 67 95  Total Bilirubin 0.0 - 1.2 mg/dL 0.3 0.7 0.4    Lab Results  Component Value Date/Time   TSH 1.490 04/08/2021 08:45 AM   TSH 3.170 10/07/2020 08:46 AM    CBC Latest Ref Rng & Units 04/08/2021 10/07/2020 04/06/2020  WBC 3.4 - 10.8 x10E3/uL 6.5 6.4 5.9  Hemoglobin 11.1 - 15.9 g/dL 11.7 12.6 11.4  Hematocrit 34.0 - 46.6 % 36.5 37.9 34.5  Platelets 150 - 450 x10E3/uL 208 209 213    Lab Results  Component Value Date/Time   VD25OH 42.8 05/12/2014 09:33 AM    Clinical ASCVD: No  The 10-year ASCVD risk score (Arnett DK, et al., 2019) is: 18.3%   Values used to calculate the score:     Age: 75 years     Sex: Female     Is Non-Hispanic African American: No     Diabetic: No     Tobacco smoker: No     Systolic Blood Pressure: 224 mmHg     Is BP treated: Yes     HDL Cholesterol: 56 mg/dL     Total Cholesterol: 221 mg/dL    Other: (CHADS2VASc if Afib, PHQ9 if depression, MMRC or CAT for COPD, ACT, DEXA)  Social History   Tobacco Use  Smoking Status Former   Packs/day: 2.00   Years: 40.00   Pack years: 80.00   Types: Cigarettes   Quit date: 12/21/2006   Years since quitting: 14.5  Smokeless Tobacco Never   BP Readings from Last 3 Encounters:  04/08/21 118/70  10/07/20 117/80  04/06/20 108/68   Pulse Readings from Last 3 Encounters:  04/08/21 95  10/07/20 97  04/06/20 85   Wt Readings from Last 3 Encounters:  06/17/21 166 lb (75.3 kg)  04/08/21 168 lb (76.2 kg)  10/07/20 174 lb (78.9 kg)    Assessment: Review of patient past medical history, allergies, medications, health status, including review of  consultants reports, laboratory and other test data, was performed as part of comprehensive evaluation and provision of chronic care management services.   SDOH:  (Social Determinants of Health) assessments and interventions performed:    CCM Care Plan  Allergies  Allergen Reactions   Amoxicillin-Pot Clavulanate Nausea And Vomiting and Other (See Comments)    STOMACH CRAMPS  Has patient had a PCN reaction causing immediate rash, facial/tongue/throat swelling, SOB or lightheadedness with hypotension: No Has patient had a PCN reaction causing severe rash involving mucus membranes or skin necrosis: No Has patient had a PCN reaction that required hospitalization No Has patient had a PCN reaction occurring within the last 10 years: Yes If all of the above answers are "NO", then may proceed with Cephalosporin use.  Medications Reviewed Today     Reviewed by Sandrea Hammond, LPN (Licensed Practical Nurse) on 06/17/21 at 1033  Med List Status: <None>   Medication Order Taking? Sig Documenting Provider Last Dose Status Informant  ALPRAZolam (XANAX) 0.25 MG tablet [id number] Yes Take 1 tablet (0.25 mg total) by mouth 2 (two) times daily as needed for anxiety. Hassell Done Mary-Margaret, FNP Taking Active   benazepril-hydrochlorthiazide (LOTENSIN HCT) 20-12.5 MG tablet [id number] Yes Take 0.5 tablets by mouth daily. Chevis Pretty, FNP Taking Active   cyanocobalamin ((VITAMIN B-12)) injection 1,000 mcg [id number]   Chevis Pretty, FNP  Active   ezetimibe (ZETIA) 10 MG tablet [id number] Yes Take 1 tablet (10 mg total) by mouth daily. Hassell Done Mary-Margaret, FNP Taking Active   gabapentin (NEURONTIN) 100 MG capsule [id number] Yes Take 1 capsule (100 mg total) by mouth 3 (three) times daily. Hassell Done, Mary-Margaret, FNP Taking Active   letrozole [facility]) 2.5 MG tablet [id number] Yes TAKE 1 TABLET DAILY Nicholas Lose, MD Taking Active   levothyroxine (SYNTHROID) 75 MCG tablet [id number] Yes Take 1  tablet (75 mcg total) by mouth daily. Hassell Done, Mary-Margaret, FNP Taking Active   rOPINIRole (REQUIP) 0.25 MG tablet [id number] Yes Take 1 tablet (0.25 mg total) by mouth 3 (three) times daily. Hassell Done, Mary-Margaret, FNP Taking Active   sertraline (ZOLOFT) 50 MG tablet [id number] Yes Take 1 tablet (50 mg total) by mouth daily. Hassell Done Mary-Margaret, FNP Taking Active   traZODone (DESYREL) 50 MG tablet [id number] Yes Take 0.5-1 tablets (25-50 mg total) by mouth at bedtime as needed for sleep. Chevis Pretty, FNP Taking Active             Patient Active Problem List   Diagnosis Date Noted   Malignant neoplasm of upper-inner quadrant of right breast in female, estrogen receptor positive ([place]) 05/31/2018   Chronic right shoulder pain 04/11/2017   S/P total knee replacement 08/15/2016   Other seasonal allergic rhinitis 12/21/2015   BMI 31.0-31.9,adult 06/19/2015   Anemia 05/27/2015   Insomnia 03/09/2015   Osteoporosis with pathological fracture of ankle and foot 05/22/2014   Hypothyroidism 05/12/2014   Hyperlipidemia with target LDL less than 100 05/12/2014   Restless leg syndrome 02/21/2014   Neuropathy 02/21/2014   Unilateral primary osteoarthritis, right knee 05/23/2013   GAD (generalized anxiety disorder) 12/21/2012   Depression 12/21/2012    Immunization History  Administered Date(s) Administered   Fluad Quad(high Dose 65+) 06/03/2019, 06/09/2020, 06/07/2021   Influenza Split 06/18/2013   Influenza,inj,Quad PF,6+ Mos 06/09/2014, 06/19/2015, 06/10/2017, 07/12/2018   Influenza-Unspecified 06/20/2016   Moderna Covid-19 Vaccine Bivalent Booster 64yr & up 06/29/2021   Moderna Sars-Covid-2 Vaccination 10/08/2019, 11/05/2019, 08/11/2020   Pneumococcal Conjugate-13 11/20/2014   Pneumococcal Polysaccharide-23 09/12/2010   Tdap 12/29/2016   Zoster Recombinat (Shingrix) 04/08/2021   Zoster, Live 05/24/2013    Conditions to be addressed/monitored: HLD  Care Plan : PHARMD  MEDICATION MANAGEMENT  Updates made by PLavera Guise RTylersince 06/30/2021 12:00 AM     Problem: DISEASE PROGRESSION PREVENTION      Long-Range Goal: HYPERLIPIDEMIA   This Visit's Progress: Not on track  Priority: High  Note:   Current Barriers:  Unable to achieve control of HYPERLIPIDEMIA  Suboptimal therapeutic regimen for HYPERLIPIDEMIA  Pharmacist Clinical Goal(s):  patient will verbalize ability to afford treatment regimen achieve control of HYPERLIPIDEMIA as evidenced by DECREASED LDL IN 6 MONTHS  through collaboration with PharmD and provider.   Interventions: 1:1 collaboration with MChevis Pretty FNP regarding development and update of comprehensive plan of  care as evidenced by provider attestation and co-signature Inter-disciplinary care team collaboration (see longitudinal plan of care) Comprehensive medication review performed; medication list updated in electronic medical record  Hyperlipidemia:  New goal. Uncontrolled; current treatment: generic zetia 66m (started 1 month ago, tolerating well, affordable);  Intolerances: STATINS Risk Factors: HTN, former smoker (8106pk yr)  Family History: HLD (parents) Social History: FORMER smoker (80 PACK YEAR HISTORY)/no alcohol  LDL goal: <100 LFTs wnl Diet: handout provided on dietary recommendations in patient with hyperlipidemia  Exercise: walks Lipid Panel     Component Value Date/Time   CHOL 221 (H) 04/08/2021 0845   CHOL 183 12/21/2012 1056   TRIG 96 04/08/2021 0845   TRIG 97 11/20/2014 1128   TRIG 116 12/21/2012 1056   HDL 56 04/08/2021 0845   HDL 57 11/20/2014 1128   HDL 55 12/21/2012 1056   CHOLHDL 3.9 04/08/2021 0845   LDLCALC 148 (H) 04/08/2021 0845   LDLCALC 88 05/12/2014 0933   LDLCALC 105 (H) 12/21/2012 1056   LABVLDL 17 04/08/2021 0845  1. Hyperlipidemia - Patient refuses statin (including alternative dosing), injectables, etc--she would like to improve diet/lifestyle to aid in lipid  lowering Patient agreeable to CONTINUE on ZETIA 174mdaily Will reach out in January to see if additional hyperlipidemia treatment is needed; can attempt to enroll in [employer] grant ( all other HLD options will be costly)   Patient Goals/Self-Care Activities patient will:  - take medications as prescribed collaborate with provider on medication access solutions  Follow Up Plan: Telephone follow up appointment with care management team member scheduled for: 09/2021      Medication Assistance: None required.  Patient affirms current coverage meets needs.  [id number]: [phone]: [phone] Up:  Patient agrees to Care Plan and Follow-up.  Plan: Telephone follow up appointment with care management team member scheduled for:  09/2021   JuRegina EckPharmD, BCPS Clinical Pharmacist, [facility] Phone [phone]

## 2021-07-12 DIAGNOSIS — E785 Hyperlipidemia, unspecified: Secondary | ICD-10-CM

## 2021-07-19 ENCOUNTER — Other Ambulatory Visit: Payer: Self-pay

## 2021-07-19 ENCOUNTER — Ambulatory Visit (INDEPENDENT_AMBULATORY_CARE_PROVIDER_SITE_OTHER): Payer: Medicare HMO

## 2021-07-19 DIAGNOSIS — E538 Deficiency of other specified B group vitamins: Secondary | ICD-10-CM

## 2021-07-19 NOTE — Progress Notes (Signed)
Cyanocobalamin injection given to left deltoid.  Patient tolerated well. 

## 2021-08-18 ENCOUNTER — Ambulatory Visit (INDEPENDENT_AMBULATORY_CARE_PROVIDER_SITE_OTHER): Payer: Medicare HMO

## 2021-08-18 ENCOUNTER — Other Ambulatory Visit: Payer: Self-pay

## 2021-08-18 DIAGNOSIS — E538 Deficiency of other specified B group vitamins: Secondary | ICD-10-CM

## 2021-08-18 NOTE — Progress Notes (Signed)
Cyanocobalamin injection given to left deltoid.  Patient tolerated well. 

## 2021-08-30 ENCOUNTER — Ambulatory Visit (INDEPENDENT_AMBULATORY_CARE_PROVIDER_SITE_OTHER): Payer: Medicare HMO | Admitting: Nurse Practitioner

## 2021-08-30 ENCOUNTER — Encounter: Payer: Self-pay | Admitting: Nurse Practitioner

## 2021-08-30 VITALS — BP 113/67 | HR 102 | Temp 97.8°F | Resp 20 | Ht 64.0 in | Wt 160.0 lb

## 2021-08-30 DIAGNOSIS — J40 Bronchitis, not specified as acute or chronic: Secondary | ICD-10-CM | POA: Diagnosis not present

## 2021-08-30 MED ORDER — PREDNISONE 20 MG PO TABS: ORAL_TABLET | ORAL | 0 refills | Status: DC

## 2021-08-30 MED ORDER — METHYLPREDNISOLONE ACETATE 80 MG/ML IJ SUSP: 80.0000 mg | Freq: Once | INTRAMUSCULAR | Status: AC

## 2021-08-30 MED ORDER — ALBUTEROL SULFATE HFA 108 (90 BASE) MCG/ACT IN AERS: 2.0000 | INHALATION_SPRAY | Freq: Four times a day (QID) | RESPIRATORY_TRACT | 0 refills | Status: DC | PRN

## 2021-08-30 MED ORDER — AZITHROMYCIN 250 MG PO TABS: ORAL_TABLET | ORAL | 0 refills | Status: DC

## 2021-08-30 NOTE — Progress Notes (Signed)
° °  Subjective:    Patient ID: Phyllis Walker, female    DOB: 1946/08/21, 75 y.o.   MRN: [id number]  Chief Complaint: Weakness, Cough, and Nasal Congestion   HPI Patient comes in today c/o cough and congestion. Started last Wednesday. Has no energy.     Review of Systems  Constitutional:  Positive for fatigue. Negative for chills and fever.  HENT:  Positive for congestion and rhinorrhea. Negative for sore throat.   Respiratory:  Positive for cough. Negative for shortness of breath.   Musculoskeletal:  Positive for myalgias.  Neurological:  Negative for dizziness and headaches.      Objective:   Physical Exam Vitals reviewed.  Constitutional:      Appearance: Normal appearance.  HENT:     Right Ear: Tympanic membrane normal. There is no impacted cerumen.     Left Ear: Tympanic membrane normal. There is no impacted cerumen.     Nose: Congestion and rhinorrhea present.  Cardiovascular:     Rate and Rhythm: Normal rate and regular rhythm.     Heart sounds: Normal heart sounds.  Pulmonary:     Effort: Pulmonary effort is normal.     Breath sounds: Rales (throughout) present. No wheezing.  Skin:    General: Skin is warm.  Neurological:     General: No focal deficit present.     Mental Status: She is alert and oriented to person, place, and time.    BP 113/67    Pulse (!) 102    Temp 97.8 F (36.6 C) (Temporal)    Resp 20    Ht 5\' 4"  (1.626 m)    Wt 160 lb (72.6 kg)    SpO2 96%    BMI 27.46 kg/m        Assessment & Plan:  Phyllis Walker in today with chief complaint of Weakness, Cough, and Nasal Congestion   1. Bronchitis Possible pneumonia- no xray available 1. Take meds as prescribed 2. Use a cool mist humidifier especially during the winter months and when heat has been humid. 3. Use saline nose sprays frequently 4. Saline irrigations of the nose can be very helpful if done frequently.  * 4X daily for 1 week*  * Use of a nettie pot can be helpful with this. Follow  directions with this* 5. Drink plenty of fluids 6. Keep thermostat turn down low 7.For any cough or congestion- robitussin 8. For fever or aces or pains- take tylenol or ibuprofen appropriate for age and weight.  * for fevers greater than 101 orally you may alternate ibuprofen and tylenol every  3 hours.    - methylPREDNISolone acetate (DEPO-MEDROL) injection 80 mg - predniSONE (DELTASONE) 20 MG tablet; 2 po at sametime daily for 5 days-  Dispense: 10 tablet; Refill: 0 - azithromycin (ZITHROMAX Z-PAK) 250 MG tablet; As directed  Dispense: 6 tablet; Refill: 0 - albuterol (VENTOLIN HFA) 108 (90 Base) MCG/ACT inhaler; Inhale 2 puffs into the lungs every 6 (six) hours as needed for wheezing or shortness of breath.  Dispense: 8 g; Refill: 0    The above assessment and management plan was discussed with the patient. The patient verbalized understanding of and has agreed to the management plan. Patient is aware to call the clinic if symptoms persist or worsen. Patient is aware when to return to the clinic for a follow-up visit. Patient educated on when it is appropriate to go to the emergency department.   Mary-Margaret Hassell Done, FNP

## 2021-08-30 NOTE — Patient Instructions (Signed)
Community-Acquired Pneumonia, Adult °Pneumonia is an infection of the lungs. It causes irritation and swelling in the airways of the lungs. Mucus and fluid may also build up inside the airways. This may cause coughing and trouble breathing. °One type of pneumonia can happen while you are in a hospital. A different type can happen when you are not in a hospital (community-acquired pneumonia). °What are the causes? °This condition is caused by germs (viruses, bacteria, or fungi). Some types of germs can spread from person to person. Pneumonia is not thought to spread from person to person. °What increases the risk? °You are more likely to develop this condition if: °You have a long-term (chronic) disease, such as: °Disease of the lungs. This may be chronic obstructive pulmonary disease (COPD) or asthma. °Heart failure. °Cystic fibrosis. °Diabetes. °Kidney disease. °Sickle cell disease. °HIV. °You have other health problems, such as: °Your body's defense system (immune system) is weak. °A condition that may cause you to breathe in fluids from your mouth and nose. °You had your spleen taken out. °You do not take good care of your teeth and mouth (poor dental hygiene). °You use or have used tobacco products. °You travel where the germs that cause this illness are common. °You are near certain animals or the places they live. °You are older than 76 years of age. °What are the signs or symptoms? °Symptoms of this condition include: °A cough. °A fever. °Sweating or chills. °Chest pain, often when you breathe deeply or cough. °Breathing problems, such as: °Fast breathing. °Trouble breathing. °Shortness of breath. °Feeling tired (fatigued). °Muscle aches. °How is this treated? °Treatment for this condition depends on many things, such as: °The cause of your illness. °Your medicines. °Your other health problems. °Most adults can be treated at home. Sometimes, treatment must happen in a hospital. °Treatment may include  medicines to kill germs. °Medicines may depend on which germ caused your illness. °Very bad pneumonia is rare. If you get it, you may: °Have a machine to help you breathe. °Have fluid taken away from around your lungs. °Follow these instructions at home: °Medicines °Take over-the-counter and prescription medicines only as told by your doctor. °Take cough medicine only if you are losing sleep. Cough medicine can keep your body from taking mucus away from your lungs. °If you were prescribed an antibiotic medicine, take it as told by your doctor. Do not stop taking the antibiotic even if you start to feel better. °Lifestyle °  °Do not drink alcohol. °Do not use any products that contain nicotine or tobacco, such as cigarettes, e-cigarettes, and chewing tobacco. If you need help quitting, ask your doctor. °Eat a healthy diet. This includes a lot of vegetables, fruits, whole grains, low-fat dairy products, and low-fat (lean) protein. °General instructions ° °Rest a lot. Sleep for at least 8 hours each night. °Sleep with your head and neck raised. Put a few pillows under your head or sleep in a reclining chair. °Return to your normal activities as told by your doctor. Ask your doctor what activities are safe for you. °Drink enough fluid to keep your pee (urine) pale yellow. °If your throat is sore, rinse your mouth often with salt water. To make salt water, dissolve ½-1 tsp (3-6 g) of salt in 1 cup (237 mL) of warm water. °Keep all follow-up visits as told by your doctor. This is important. °How is this prevented? °You can lower your risk of pneumonia by: °Getting the pneumonia shot (vaccine). These shots have different   types and schedules. Ask your doctor what works best for you. Think about getting this shot if: °You are older than 76 years of age. °You are 19-65 years of age and: °You are being treated for cancer. °You have long-term lung disease. °You have other problems that affect your body's defense system. Ask  your doctor if you have one of these. °Getting your flu shot every year. Ask your doctor which type of shot is best for you. °Going to the dentist as often as told. °Washing your hands often with soap and water for at least 20 seconds. If you cannot use soap and water, use hand sanitizer. °Contact a doctor if: °You have a fever. °You lose sleep because your cough medicine does not help. °Get help right away if: °You are short of breath and this gets worse. °You have more chest pain. °Your sickness gets worse. This is very serious if: °You are an older adult. °Your body's defense system is weak. °You cough up blood. °These symptoms may be an emergency. Do not wait to see if the symptoms will go away. Get medical help right away. Call your local emergency services (911 in the [place] Do not drive yourself to the hospital. °Summary °Pneumonia is an infection of the lungs. °Community-acquired pneumonia affects people who have not been in the hospital. Certain germs can cause this infection. °This condition may be treated with medicines that kill germs. °For very bad pneumonia, you may need a hospital stay and treatment to help with breathing. °This information is not intended to replace advice given to you by your health care provider. Make sure you discuss any questions you have with your health care provider. °Document Revised: 06/11/2019 Document Reviewed: 06/11/2019 °Elsevier Patient Education © 2022 [employer] ° °

## 2021-09-20 ENCOUNTER — Ambulatory Visit (INDEPENDENT_AMBULATORY_CARE_PROVIDER_SITE_OTHER): Payer: Medicare HMO | Admitting: *Deleted

## 2021-09-20 DIAGNOSIS — E538 Deficiency of other specified B group vitamins: Secondary | ICD-10-CM | POA: Diagnosis not present

## 2021-09-20 NOTE — Progress Notes (Signed)
B12 injection tolerated well to left deltoid

## 2021-09-27 DIAGNOSIS — Z1231 Encounter for screening mammogram for malignant neoplasm of breast: Secondary | ICD-10-CM | POA: Diagnosis not present

## 2021-10-07 ENCOUNTER — Other Ambulatory Visit: Payer: Self-pay | Admitting: Nurse Practitioner

## 2021-10-07 DIAGNOSIS — E039 Hypothyroidism, unspecified: Secondary | ICD-10-CM

## 2021-10-07 DIAGNOSIS — F3342 Major depressive disorder, recurrent, in full remission: Secondary | ICD-10-CM

## 2021-10-08 ENCOUNTER — Other Ambulatory Visit: Payer: Self-pay | Admitting: Nurse Practitioner

## 2021-10-08 DIAGNOSIS — F411 Generalized anxiety disorder: Secondary | ICD-10-CM

## 2021-10-11 ENCOUNTER — Encounter: Payer: Self-pay | Admitting: Nurse Practitioner

## 2021-10-11 ENCOUNTER — Ambulatory Visit (INDEPENDENT_AMBULATORY_CARE_PROVIDER_SITE_OTHER): Payer: Medicare HMO | Admitting: Nurse Practitioner

## 2021-10-11 VITALS — BP 121/73 | HR 82 | Temp 97.2°F | Resp 20 | Ht 64.0 in | Wt 162.0 lb

## 2021-10-11 DIAGNOSIS — F5101 Primary insomnia: Secondary | ICD-10-CM

## 2021-10-11 DIAGNOSIS — G629 Polyneuropathy, unspecified: Secondary | ICD-10-CM | POA: Diagnosis not present

## 2021-10-11 DIAGNOSIS — G2581 Restless legs syndrome: Secondary | ICD-10-CM | POA: Diagnosis not present

## 2021-10-11 DIAGNOSIS — C50211 Malignant neoplasm of upper-inner quadrant of right female breast: Secondary | ICD-10-CM

## 2021-10-11 DIAGNOSIS — R69 Illness, unspecified: Secondary | ICD-10-CM | POA: Diagnosis not present

## 2021-10-11 DIAGNOSIS — Z6831 Body mass index (BMI) 31.0-31.9, adult: Secondary | ICD-10-CM

## 2021-10-11 DIAGNOSIS — E039 Hypothyroidism, unspecified: Secondary | ICD-10-CM | POA: Diagnosis not present

## 2021-10-11 DIAGNOSIS — E785 Hyperlipidemia, unspecified: Secondary | ICD-10-CM

## 2021-10-11 DIAGNOSIS — F3342 Major depressive disorder, recurrent, in full remission: Secondary | ICD-10-CM

## 2021-10-11 DIAGNOSIS — Z170 Estrogen receptor positive status [ER+]: Secondary | ICD-10-CM

## 2021-10-11 DIAGNOSIS — D649 Anemia, unspecified: Secondary | ICD-10-CM | POA: Diagnosis not present

## 2021-10-11 DIAGNOSIS — Z23 Encounter for immunization: Secondary | ICD-10-CM

## 2021-10-11 DIAGNOSIS — F411 Generalized anxiety disorder: Secondary | ICD-10-CM

## 2021-10-11 MED ORDER — ROPINIROLE HCL 0.25 MG PO TABS: 0.2500 mg | ORAL_TABLET | Freq: Three times a day (TID) | ORAL | 1 refills | Status: DC

## 2021-10-11 MED ORDER — TRAZODONE HCL 50 MG PO TABS: 25.0000 mg | ORAL_TABLET | Freq: Every evening | ORAL | 1 refills | Status: DC | PRN

## 2021-10-11 MED ORDER — SERTRALINE HCL 50 MG PO TABS: 50.0000 mg | ORAL_TABLET | Freq: Every day | ORAL | 1 refills | Status: DC

## 2021-10-11 MED ORDER — EZETIMIBE 10 MG PO TABS: 10.0000 mg | ORAL_TABLET | Freq: Every day | ORAL | 3 refills | Status: DC

## 2021-10-11 MED ORDER — LEVOTHYROXINE SODIUM 75 MCG PO TABS: 75.0000 ug | ORAL_TABLET | Freq: Every day | ORAL | 1 refills | Status: DC

## 2021-10-11 MED ORDER — ALPRAZOLAM 0.25 MG PO TABS: 0.2500 mg | ORAL_TABLET | Freq: Two times a day (BID) | ORAL | 5 refills | Status: DC | PRN

## 2021-10-11 MED ORDER — GABAPENTIN 100 MG PO CAPS: 100.0000 mg | ORAL_CAPSULE | Freq: Three times a day (TID) | ORAL | 1 refills | Status: DC

## 2021-10-11 NOTE — Progress Notes (Signed)
Subjective:    Patient ID: Phyllis Walker, female    DOB: 10/10/1945, 76 y.o.   MRN: [id number]  Chief Complaint: medical management of chronic issues     HPI:  Phyllis Walker is a 76 y.o. who identifies as a female who was assigned female at birth.   Social history: Lives with: husband Work history: retired   Scientist, forensic in today for follow up of the following chronic medical issues:  1. Hyperlipidemia with target LDL less than 100 Does not watch diet as closely as she should. Does no dedicated exercise. Lab Results  Component Value Date   CHOL 221 (H) 04/08/2021   HDL 56 04/08/2021   LDLCALC 148 (H) 04/08/2021   TRIG 96 04/08/2021   CHOLHDL 3.9 04/08/2021     2. Acquired hypothyroidism No problems that she is aware of. Lab Results  Component Value Date   TSH 1.490 04/08/2021   T4TOTAL 10.3 04/08/2021     3. Anemia, unspecified type No c/o fatigue Lab Results  Component Value Date   HGB 11.7 04/08/2021     4. 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 Is on neurontin which helps.  5. Malignant neoplasm of upper-inner quadrant of right breast in female, estrogen receptor positive ([place]) Treatment was completed in 2019. Mammograms are up to date. Sh eis still on femara.  6. GAD (generalized anxiety disorder) Is on xanax bid and is doing well.  GAD 7 : Generalized Anxiety Score 10/11/2021 04/08/2021 10/07/2020 04/06/2020  Nervous, Anxious, on Edge 0 0 0 0  Control/stop worrying 0 0 1 0  Worry too much - different things 0 0 0 0  Trouble relaxing 0 0 0 0  Restless 0 0 0 0  Easily annoyed or irritable 0 0 0 0  Afraid - awful might happen 0 0 0 0  Total GAD 7 Score 0 0 1 0  Anxiety Difficulty Not difficult at all Not difficult at all Not difficult at all Not difficult at all       7. Recurrent major depressive disorder, in full remission [facility]) Has been dong better since she has gotten married. I son zoloft without side effects. Depression screen  [facility] 2/9 10/11/2021 06/17/2021 04/08/2021  Decreased Interest 0 0 0  Down, Depressed, Hopeless 0 0 0  PHQ - 2 Score 0 0 0  Altered sleeping 0 - 0  Tired, decreased energy 0 - 0  Change in appetite 0 - 0  Feeling bad or failure about yourself  0 - 0  Trouble concentrating 0 - 0  Moving slowly or fidgety/restless 0 - 0  Suicidal thoughts 0 - 0  PHQ-9 Score 0 - 0  Difficult doing work/chores Not difficult at all - Not difficult at all  Some recent data might be hidden      8. Restless leg syndrome Is on requip which helps.  9. Primary insomnia Is on traxadone and says she sleeps well at night. Sleeps about 8-9 hours a night  10. BMI 31.0-31.9,adult No recent weight changes Wt Readings from Last 3 Encounters:  10/11/21 162 lb (73.5 kg)  08/30/21 160 lb (72.6 kg)  06/17/21 166 lb (75.3 kg)   BMI Readings from Last 3 Encounters:  10/11/21 27.81 kg/m  08/30/21 27.46 kg/m  06/17/21 28.49 kg/m      New complaints: Use to be on benazepril /hctz and was having dizzy spells so she stopped taking it. Her blood pressure at home  has been running in 158'7 systolic without meds.  Allergies  Allergen Reactions   Amoxicillin-Pot Clavulanate Nausea And Vomiting and Other (See Comments)    STOMACH CRAMPS  Has patient had a PCN reaction causing immediate rash, facial/tongue/throat swelling, SOB or lightheadedness with hypotension: No Has patient had a PCN reaction causing severe rash involving mucus membranes or skin necrosis: No Has patient had a PCN reaction that required hospitalization No Has patient had a PCN reaction occurring within the last 10 years: Yes If all of the above answers are "NO", then may proceed with Cephalosporin use.    Outpatient Encounter Medications as of 10/11/2021  Medication Sig   albuterol (VENTOLIN HFA) 108 (90 Base) MCG/ACT inhaler Inhale 2 puffs into the lungs every 6 (six) hours as needed for wheezing or shortness of breath.   ALPRAZolam (XANAX) 0.25  MG tablet Take 1 tablet (0.25 mg total) by mouth 2 (two) times daily as needed for anxiety.   azithromycin (ZITHROMAX Z-PAK) 250 MG tablet As directed   benazepril-hydrochlorthiazide (LOTENSIN HCT) 20-12.5 MG tablet Take 0.5 tablets by mouth daily.   ezetimibe (ZETIA) 10 MG tablet Take 1 tablet (10 mg total) by mouth daily.   gabapentin (NEURONTIN) 100 MG capsule Take 1 capsule (100 mg total) by mouth 3 (three) times daily.   letrozole (FEMARA) 2.5 MG tablet TAKE 1 TABLET DAILY   levothyroxine (SYNTHROID) 75 MCG tablet TAKE 1 TABLET DAILY   predniSONE (DELTASONE) 20 MG tablet 2 po at sametime daily for 5 days-   rOPINIRole (REQUIP) 0.25 MG tablet Take 1 tablet (0.25 mg total) by mouth 3 (three) times daily.   sertraline (ZOLOFT) 50 MG tablet TAKE 1 TABLET DAILY   traZODone (DESYREL) 50 MG tablet Take 0.5-1 tablets (25-50 mg total) by mouth at bedtime as needed for sleep.   Facility-Administered Encounter Medications as of 10/11/2021  Medication   cyanocobalamin ((VITAMIN B-12)) injection 1,000 mcg   methylPREDNISolone acetate (DEPO-MEDROL) injection 80 mg    Past Surgical History:  Procedure Laterality Date   BREAST LUMPECTOMY  1987   benign fibroid tumor   BREAST LUMPECTOMY WITH RADIOACTIVE SEED AND SENTINEL LYMPH NODE BIOPSY Right 07/05/2018   Procedure: RIGHT BREAST LUMPECTOMY WITH RADIOACTIVE SEED X 2 AND RIGHT AXILLARY SENTINEL LYMPH NODE BIOPSY;  Surgeon: Alphonsa Overall, MD;  Location: [place];  Service: General;  Laterality: Right;   BUNIONECTOMY Bilateral    CATARACT EXTRACTION     COLONOSCOPY     THYROIDECTOMY, PARTIAL  1969   TONSILLECTOMY     TOTAL KNEE ARTHROPLASTY Right 08/15/2016   Procedure: TOTAL KNEE ARTHROPLASTY;  Surgeon: Vickey Huger, MD;  Location: [place];  Service: Orthopedics;  Laterality: Right;  RNFA    Family History  Problem Relation Age of Onset   Cancer Mother        pancreas   Heart attack Father    Hypertension Sister     Hyperlipidemia Sister    Anuerysm Sister    Hypertension Sister    Hyperlipidemia Sister    Early death Sister 74       accident   Ovarian cancer Maternal Aunt       Controlled substance contract: n/a     Review of Systems  Constitutional:  Negative for diaphoresis.  Eyes:  Negative for pain.  Respiratory:  Negative for shortness of breath.   Cardiovascular:  Negative for chest pain, palpitations and leg swelling.  Gastrointestinal:  Negative for abdominal pain.  Endocrine: Negative for polydipsia.  Skin:  Negative for rash.  Neurological:  Negative for dizziness, weakness and headaches.  Hematological:  Does not bruise/bleed easily.  All other systems reviewed and are negative.     Objective:   Physical Exam Vitals and nursing note reviewed.  Constitutional:      General: She is not in acute distress.    Appearance: Normal appearance. She is well-developed.  HENT:     Head: Normocephalic.     Right Ear: Tympanic membrane normal.     Left Ear: Tympanic membrane normal.     Nose: Nose normal.     Mouth/Throat:     Mouth: Mucous membranes are moist.  Eyes:     Pupils: Pupils are equal, round, and reactive to light.  Neck:     Vascular: No carotid bruit or JVD.  Cardiovascular:     Rate and Rhythm: Normal rate and regular rhythm.     Heart sounds: Normal heart sounds.  Pulmonary:     Effort: Pulmonary effort is normal. No respiratory distress.     Breath sounds: Normal breath sounds. No wheezing or rales.  Chest:     Chest wall: No tenderness.  Abdominal:     General: Bowel sounds are normal. There is no distension or abdominal bruit.     Palpations: Abdomen is soft. There is no hepatomegaly, splenomegaly, mass or pulsatile mass.     Tenderness: There is no abdominal tenderness.  Musculoskeletal:        General: Normal range of motion.     Cervical back: Normal range of motion and neck supple.  Lymphadenopathy:     Cervical: No cervical adenopathy.  Skin:     General: Skin is warm and dry.  Neurological:     Mental Status: She is alert and oriented to person, place, and time.     Deep Tendon Reflexes: Reflexes are normal and symmetric.  Psychiatric:        Behavior: Behavior normal.        Thought Content: Thought content normal.        Judgment: Judgment normal.   BP 121/73    Pulse 82    Temp (!) 97.2 F (36.2 C) (Temporal)    Resp 20    Ht _0  (1.626 m)    Wt 162 lb (73.5 kg)    SpO2 94%    BMI 27.81 kg/m         Assessment & Plan:  MADDISON KILNER comes in today with chief complaint of Medical Management of Chronic Issues   Diagnosis and orders addressed:  1. Hyperlipidemia with target LDL less than 100 Low fat deit - ezetimibe (ZETIA) 10 MG tablet; Take 1 tablet (10 mg total) by mouth daily.  Dispense: 90 tablet; Refill: 3 - CBC with Differential/Platelet - CMP14+EGFR - Lipid panel  2. Acquired hypothyroidism Labs pending - levothyroxine (SYNTHROID) 75 MCG tablet; Take 1 tablet (75 mcg total) by mouth daily.  Dispense: 90 tablet; Refill: 1 - Thyroid Panel With TSH  3. Anemia, unspecified type Labs pending  4. Neuropathy Do not go barefooted Check feet daily - gabapentin (NEURONTIN) 100 MG capsule; Take 1 capsule (100 mg total) by mouth 3 (three) times daily.  Dispense: 270 capsule; Refill: 1  5. Malignant neoplasm of upper-inner quadrant of right breast in female, estrogen receptor positive ([place]) Keep up with yearly mammograms  6. GAD (generalized anxiety disorder) Stress manaegement - ALPRAZolam (XANAX) 0.25 MG tablet; Take 1 tablet (0.25 mg total) by mouth  2 (two) times daily as needed for anxiety.  Dispense: 60 tablet; Refill: 5  7. Recurrent major depressive disorder, in full remission ([place]) Stress management - sertraline (ZOLOFT) 50 MG tablet; Take 1 tablet (50 mg total) by mouth daily.  Dispense: 90 tablet; Refill: 1  8. Restless leg syndrome Keep legs warm at night - rOPINIRole (REQUIP) 0.25 MG tablet;  Take 1 tablet (0.25 mg total) by mouth 3 (three) times daily.  Dispense: 270 tablet; Refill: 1  9. Primary insomnia Bedtime routine - traZODone (DESYREL) 50 MG tablet; Take 0.5-1 tablets (25-50 mg total) by mouth at bedtime as needed for sleep.  Dispense: 90 tablet; Refill: 1  10. BMI 31.0-31.9,adult Discussed diet and exercise for person with BMI >25 Will recheck weight in 3-6 months    Labs pending Health Maintenance reviewed Diet and exercise encouraged  Follow up plan: 6 months   Mary-Margaret Hassell Done, FNP

## 2021-10-11 NOTE — Patient Instructions (Signed)
Recombinant Zoster (Shingles) Vaccine: What You Need to Know °1. Why get vaccinated? °Recombinant zoster (shingles) vaccine can prevent shingles. °Shingles (also called herpes zoster, or just zoster) is a painful skin rash, usually with blisters. In addition to the rash, shingles can cause fever, headache, chills, or upset stomach. Rarely, shingles can lead to complications such as pneumonia, hearing problems, blindness, brain inflammation (encephalitis), or death. °The risk of shingles increases with age. The most common complication of shingles is long-term nerve pain called postherpetic neuralgia (PHN). PHN occurs in the areas where the shingles rash was and can last for months or years after the rash goes away. The pain from PHN can be severe and debilitating. °The risk of PHN increases with age. An older adult with shingles is more likely to develop PHN and have longer lasting and more severe pain than a younger person. °People with weakened immune systems also have a higher risk of getting shingles and complications from the disease. °Shingles is caused by varicella-zoster virus, the same virus that causes chickenpox. After you have chickenpox, the virus stays in your body and can cause shingles later in life. Shingles cannot be passed from one person to another, but the virus that causes shingles can spread and cause chickenpox in someone who has never had chickenpox or has never received chickenpox vaccine. °2. Recombinant shingles vaccine °Recombinant shingles vaccine provides strong protection against shingles. By preventing shingles, recombinant shingles vaccine also protects against PHN and other complications. °Recombinant shingles vaccine is recommended for: °Adults 50 years and older °Adults 19 years and older who have a weakened immune system because of disease or treatments °Shingles vaccine is given as a two-dose series. For most people, the second dose should be given 2 to 6 months after the first  dose. Some people who have or will have a weakened immune system can get the second dose 1 to 2 months after the first dose. Ask your health care provider for guidance. °People who have had shingles in the past and people who have received varicella (chickenpox) vaccine are recommended to get recombinant shingles vaccine. The vaccine is also recommended for people who have already gotten another type of shingles vaccine, the live shingles vaccine. There is no live virus in recombinant shingles vaccine. °Shingles vaccine may be given at the same time as other vaccines. °3. Talk with your health care provider °Tell your vaccination provider if the person getting the vaccine: °Has had an allergic reaction after a previous dose of recombinant shingles vaccine, or has any severe, life-threatening allergies °Is currently experiencing an episode of shingles °Is pregnant °In some cases, your health care provider may decide to postpone shingles vaccination until a future visit. °People with minor illnesses, such as a cold, may be vaccinated. People who are moderately or severely ill should usually wait until they recover before getting recombinant shingles vaccine. °Your health care provider can give you more information. °4. Risks of a vaccine reaction °A sore arm with mild or moderate pain is very common after recombinant shingles vaccine. Redness and swelling can also happen at the site of the injection. °Tiredness, muscle pain, headache, shivering, fever, stomach pain, and nausea are common after recombinant shingles vaccine. °These side effects may temporarily prevent a vaccinated person from doing regular activities. Symptoms usually go away on their own in 2 to 3 days. You should still get the second dose of recombinant shingles vaccine even if you had one of these reactions after the first dose. °Guillain-Barré   syndrome (GBS), a serious nervous system disorder, has been reported very rarely after recombinant zoster  vaccine. °People sometimes faint after medical procedures, including vaccination. Tell your provider if you feel dizzy or have vision changes or ringing in the ears. °As with any medicine, there is a very remote chance of a vaccine causing a severe allergic reaction, other serious injury, or death. °5. What if there is a serious problem? °An allergic reaction could occur after the vaccinated person leaves the clinic. If you see signs of a severe allergic reaction (hives, swelling of the face and throat, difficulty breathing, a fast heartbeat, dizziness, or weakness), call 9-1-1 and get the person to the nearest hospital. °For other signs that concern you, call your health care provider. °Adverse reactions should be reported to the Vaccine Adverse Event Reporting System (VAERS). Your health care provider will usually file this report, or you can do it yourself. Visit the VAERS website at www.vaers.hhs.gov or call 1-800-822-7967. VAERS is only for reporting reactions, and VAERS staff members do not give medical advice. °6. How can I learn more? °Ask your health care provider. °Call your local or state health department. °Visit the website of the Food and Drug Administration (FDA) for vaccine package inserts and additional information at www.fda.gov/vaccinesblood-biologics/vaccines. °Contact the Centers for Disease Control and Prevention (CDC): °Call 1-800-232-4636 (1-800-CDC-INFO) or °Visit CDC's website at www.cdc.gov/vaccines. °Vaccine Information Statement Recombinant Zoster Vaccine (10/16/2020) °This information is not intended to replace advice given to you by your health care provider. Make sure you discuss any questions you have with your health care provider. °Document Revised: 05/14/2021 Document Reviewed: 10/30/2020 °Elsevier Patient Education © 2022 Elsevier Inc. ° °

## 2021-10-12 LAB — CBC WITH DIFFERENTIAL/PLATELET
Basophils Absolute: 0.1 10*3/uL (ref 0.0–0.2)
Basos: 2 %
EOS (ABSOLUTE): 0.3 10*3/uL (ref 0.0–0.4)
Eos: 4 %
Hematocrit: 39.3 % (ref 34.0–46.6)
Hemoglobin: 12.7 g/dL (ref 11.1–15.9)
Immature Grans (Abs): 0 10*3/uL (ref 0.0–0.1)
Immature Granulocytes: 0 %
Lymphocytes Absolute: 1.5 10*3/uL (ref 0.7–3.1)
Lymphs: 25 %
MCH: 27.7 pg (ref 26.6–33.0)
MCHC: 32.3 g/dL (ref 31.5–35.7)
MCV: 86 fL (ref 79–97)
Monocytes Absolute: 0.5 10*3/uL (ref 0.1–0.9)
Monocytes: 7 %
Neutrophils Absolute: 3.9 10*3/uL (ref 1.4–7.0)
Neutrophils: 62 %
Platelets: 212 10*3/uL (ref 150–450)
RBC: 4.59 x10E6/uL (ref 3.77–5.28)
RDW: 14.1 % (ref 11.7–15.4)
WBC: 6.3 10*3/uL (ref 3.4–10.8)

## 2021-10-12 LAB — CMP14+EGFR
ALT: 7 IU/L (ref 0–32)
AST: 17 IU/L (ref 0–40)
Albumin/Globulin Ratio: 2 (ref 1.2–2.2)
Albumin: 4.3 g/dL (ref 3.7–4.7)
Alkaline Phosphatase: 97 IU/L (ref 44–121)
BUN/Creatinine Ratio: 19 (ref 12–28)
BUN: 14 mg/dL (ref 8–27)
Bilirubin Total: 0.5 mg/dL (ref 0.0–1.2)
CO2: 27 mmol/L (ref 20–29)
Calcium: 9.1 mg/dL (ref 8.7–10.3)
Chloride: 105 mmol/L (ref 96–106)
Creatinine, Ser: 0.73 mg/dL (ref 0.57–1.00)
Globulin, Total: 2.1 g/dL (ref 1.5–4.5)
Glucose: 92 mg/dL (ref 70–99)
Potassium: 4.2 mmol/L (ref 3.5–5.2)
Sodium: 145 mmol/L — ABNORMAL HIGH (ref 134–144)
Total Protein: 6.4 g/dL (ref 6.0–8.5)
eGFR: 85 mL/min/{1.73_m2} (ref >59)

## 2021-10-12 LAB — LIPID PANEL
Chol/HDL Ratio: 3.5 ratio (ref 0.0–4.4)
Cholesterol, Total: 221 mg/dL — ABNORMAL HIGH (ref 100–199)
HDL: 64 mg/dL (ref >39)
LDL Chol Calc (NIH): 140 mg/dL — ABNORMAL HIGH (ref 0–99)
Triglycerides: 95 mg/dL (ref 0–149)
VLDL Cholesterol Cal: 17 mg/dL (ref 5–40)

## 2021-10-12 LAB — THYROID PANEL WITH TSH
Free Thyroxine Index: 2.4 (ref 1.2–4.9)
T3 Uptake Ratio: 26 % (ref 24–39)
T4, Total: 9.3 ug/dL (ref 4.5–12.0)
TSH: 0.802 u[IU]/mL (ref 0.450–4.500)

## 2021-10-12 NOTE — Addendum Note (Signed)
Addended by: Rolena Infante on: 10/12/2021 01:36 PM   Modules accepted: Orders

## 2021-10-22 ENCOUNTER — Ambulatory Visit (INDEPENDENT_AMBULATORY_CARE_PROVIDER_SITE_OTHER): Payer: Medicare HMO

## 2021-10-22 DIAGNOSIS — E538 Deficiency of other specified B group vitamins: Secondary | ICD-10-CM | POA: Diagnosis not present

## 2021-10-22 NOTE — Progress Notes (Signed)
Pt given Cyanocobalamin  1095mcg/ml IM on left arm. Pt tol well.

## 2021-10-29 DIAGNOSIS — E785 Hyperlipidemia, unspecified: Secondary | ICD-10-CM | POA: Diagnosis not present

## 2021-10-29 DIAGNOSIS — R69 Illness, unspecified: Secondary | ICD-10-CM | POA: Diagnosis not present

## 2021-10-29 DIAGNOSIS — Z87891 Personal history of nicotine dependence: Secondary | ICD-10-CM | POA: Diagnosis not present

## 2021-10-29 DIAGNOSIS — G2581 Restless legs syndrome: Secondary | ICD-10-CM | POA: Diagnosis not present

## 2021-10-29 DIAGNOSIS — G629 Polyneuropathy, unspecified: Secondary | ICD-10-CM | POA: Diagnosis not present

## 2021-10-29 DIAGNOSIS — Z79811 Long term (current) use of aromatase inhibitors: Secondary | ICD-10-CM | POA: Diagnosis not present

## 2021-10-29 DIAGNOSIS — Z853 Personal history of malignant neoplasm of breast: Secondary | ICD-10-CM | POA: Diagnosis not present

## 2021-10-29 DIAGNOSIS — E034 Atrophy of thyroid (acquired): Secondary | ICD-10-CM | POA: Diagnosis not present

## 2021-11-19 ENCOUNTER — Ambulatory Visit (INDEPENDENT_AMBULATORY_CARE_PROVIDER_SITE_OTHER): Payer: Medicare HMO | Admitting: *Deleted

## 2021-11-19 DIAGNOSIS — E538 Deficiency of other specified B group vitamins: Secondary | ICD-10-CM | POA: Diagnosis not present

## 2021-12-20 ENCOUNTER — Ambulatory Visit (INDEPENDENT_AMBULATORY_CARE_PROVIDER_SITE_OTHER): Payer: Medicare HMO | Admitting: Family Medicine

## 2021-12-20 DIAGNOSIS — E538 Deficiency of other specified B group vitamins: Secondary | ICD-10-CM

## 2022-01-03 ENCOUNTER — Ambulatory Visit: Payer: Medicare HMO | Admitting: Family Medicine

## 2022-01-03 ENCOUNTER — Encounter: Payer: Self-pay | Admitting: Family Medicine

## 2022-01-03 ENCOUNTER — Ambulatory Visit (INDEPENDENT_AMBULATORY_CARE_PROVIDER_SITE_OTHER): Payer: Medicare HMO | Admitting: Family Medicine

## 2022-01-03 VITALS — BP 116/71 | HR 121 | Temp 96.0°F | Ht 64.0 in | Wt 161.8 lb

## 2022-01-03 DIAGNOSIS — J0110 Acute frontal sinusitis, unspecified: Secondary | ICD-10-CM

## 2022-01-03 DIAGNOSIS — N3000 Acute cystitis without hematuria: Secondary | ICD-10-CM

## 2022-01-03 DIAGNOSIS — R300 Dysuria: Secondary | ICD-10-CM | POA: Diagnosis not present

## 2022-01-03 LAB — URINALYSIS, ROUTINE W REFLEX MICROSCOPIC
Bilirubin, UA: NEGATIVE
Glucose, UA: NEGATIVE
Nitrite, UA: POSITIVE — AB
Specific Gravity, UA: 1.01 (ref 1.005–1.030)
Urobilinogen, Ur: 4 mg/dL — ABNORMAL HIGH (ref 0.2–1.0)
pH, UA: 6 (ref 5.0–7.5)

## 2022-01-03 LAB — MICROSCOPIC EXAMINATION: WBC, UA: >30 /hpf — AB (ref 0–5)

## 2022-01-03 MED ORDER — DOXYCYCLINE HYCLATE 100 MG PO TABS: 100.0000 mg | ORAL_TABLET | Freq: Two times a day (BID) | ORAL | 0 refills | Status: DC

## 2022-01-03 NOTE — Progress Notes (Signed)
? ?BP 116/71   Pulse (!) 121   Temp (!) 96 ?F (35.6 ?C) (Temporal)   Ht '5\' 4"'$  (1.626 m)   Wt 161 lb 12.8 oz (73.4 kg)   SpO2 93%   BMI 27.77 kg/m?   ? ?Subjective:  ? ?Patient ID: Phyllis Walker, female    DOB: November 26, 1945, 76 y.o.   MRN: [id number] ? ?HPI: ?Phyllis Walker is a 76 y.o. female presenting on 01/03/2022 for Back Pain, vaginal pressure (X 2 days), and Cough (X 2 days. Spiting up green mucus. ) ? ? ?HPI ?Patient is coming in today for 2 different sets of symptoms.  1 she has been having a cough for about 3 or 4 days and it is productive of green sputum and she feels a little hoarse and congestion and sinus pressure and postnasal drainage.  The other set of symptoms is that she has been having dysuria and frequency and urgency and pelvic pressure that is also been going on for the past few days.  She denies any fevers or chills but she does feel just generally ill and feeling somewhat weak because of these illnesses. ? ?Relevant past medical, surgical, family and social history reviewed and updated as indicated. Interim medical history since our last visit reviewed. ?Allergies and medications reviewed and updated. ? ?Review of Systems  ?Constitutional:  Negative for chills and fever.  ?HENT:  Positive for congestion, postnasal drip, rhinorrhea and sinus pressure. Negative for ear discharge, ear pain, sneezing and sore throat.   ?Eyes:  Negative for pain, redness and visual disturbance.  ?Respiratory:  Positive for cough. Negative for chest tightness and shortness of breath.   ?Cardiovascular:  Negative for chest pain and leg swelling.  ?Gastrointestinal:  Positive for abdominal pain. Negative for diarrhea, nausea and vomiting.  ?Genitourinary:  Positive for dysuria, flank pain, frequency and urgency. Negative for difficulty urinating, hematuria, vaginal bleeding, vaginal discharge and vaginal pain.  ?Musculoskeletal:  Negative for back pain and gait problem.  ?Skin:  Negative for rash.  ?Neurological:   Negative for light-headedness and headaches.  ?Psychiatric/Behavioral:  Negative for agitation and behavioral problems.   ?All other systems reviewed and are negative. ? ?Per HPI unless specifically indicated above ? ? ?Allergies as of 01/03/2022   ? ?   Reactions  ? Amoxicillin-pot Clavulanate Nausea And Vomiting, Other (See Comments)  ? STOMACH CRAMPS ?Has patient had a PCN reaction causing immediate rash, facial/tongue/throat swelling, SOB or lightheadedness with hypotension: No ?Has patient had a PCN reaction causing severe rash involving mucus membranes or skin necrosis: No ?Has patient had a PCN reaction that required hospitalization No ?Has patient had a PCN reaction occurring within the last 10 years: Yes ?If all of the above answers are "NO", then may proceed with Cephalosporin use.  ? ?  ? ?  ?Medication List  ?  ? ?  ? Accurate as of January 03, 2022  2:42 PM. If you have any questions, ask your nurse or doctor.  ?  ?  ? ?  ? ?albuterol 108 (90 Base) MCG/ACT inhaler ?Commonly known as: VENTOLIN HFA ?Inhale 2 puffs into the lungs every 6 (six) hours as needed for wheezing or shortness of breath. ?  ?ALPRAZolam 0.25 MG tablet ?Commonly known as: Duanne Moron ?Take 1 tablet (0.25 mg total) by mouth 2 (two) times daily as needed for anxiety. ?  ?doxycycline 100 MG tablet ?Commonly known as: VIBRA-TABS ?Take 1 tablet (100 mg total) by mouth 2 (two) times daily.  1 po bid ?Started by: Worthy Rancher, MD ?  ?ezetimibe 10 MG tablet ?Commonly known as: Zetia ?Take 1 tablet (10 mg total) by mouth daily. ?  ?gabapentin 100 MG capsule ?Commonly known as: NEURONTIN ?Take 1 capsule (100 mg total) by mouth 3 (three) times daily. ?  ?letrozole 2.5 MG tablet ?Commonly known as: [place] ?TAKE 1 TABLET DAILY ?  ?levothyroxine 75 MCG tablet ?Commonly known as: SYNTHROID ?Take 1 tablet (75 mcg total) by mouth daily. ?  ?rOPINIRole 0.25 MG tablet ?Commonly known as: REQUIP ?Take 1 tablet (0.25 mg total) by mouth 3 (three) times  daily. ?  ?sertraline 50 MG tablet ?Commonly known as: ZOLOFT ?Take 1 tablet (50 mg total) by mouth daily. ?  ?traZODone 50 MG tablet ?Commonly known as: DESYREL ?Take 0.5-1 tablets (25-50 mg total) by mouth at bedtime as needed for sleep. ?  ? ?  ? ? ? ?Objective:  ? ?BP 116/71   Pulse (!) 121   Temp (!) 96 ?F (35.6 ?C) (Temporal)   Ht '5\' 4"'$  (1.626 m)   Wt 161 lb 12.8 oz (73.4 kg)   SpO2 93%   BMI 27.77 kg/m?   ?Wt Readings from Last 3 Encounters:  ?01/03/22 161 lb 12.8 oz (73.4 kg)  ?10/11/21 162 lb (73.5 kg)  ?08/30/21 160 lb (72.6 kg)  ?  ?Physical Exam ?Vitals and nursing note reviewed.  ?Constitutional:   ?   General: She is not in acute distress. ?   Appearance: She is well-developed. She is not diaphoretic.  ?HENT:  ?   Right Ear: Tympanic membrane, ear canal and external ear normal.  ?   Left Ear: Tympanic membrane, ear canal and external ear normal.  ?   Nose: Mucosal edema present.  ?   Right Sinus: No maxillary sinus tenderness or frontal sinus tenderness.  ?   Left Sinus: No maxillary sinus tenderness or frontal sinus tenderness.  ?   Mouth/Throat:  ?   Pharynx: Uvula midline. No oropharyngeal exudate or posterior oropharyngeal erythema.  ?   Tonsils: No tonsillar abscesses.  ?Eyes:  ?   Conjunctiva/sclera: Conjunctivae normal.  ?Cardiovascular:  ?   Rate and Rhythm: Normal rate and regular rhythm.  ?   Heart sounds: Normal heart sounds. No murmur heard. ?Pulmonary:  ?   Effort: Pulmonary effort is normal. No respiratory distress.  ?   Breath sounds: Rhonchi present. No wheezing or rales.  ?Chest:  ?   Chest wall: No tenderness.  ?Abdominal:  ?   General: Bowel sounds are normal. There is no distension.  ?   Palpations: Abdomen is soft. Abdomen is not rigid. There is no mass.  ?   Tenderness: There is no abdominal tenderness. There is no guarding or rebound.  ?Musculoskeletal:     ?   General: No tenderness. Normal range of motion.  ?Skin: ?   General: Skin is warm and dry.  ?   Findings: No rash.   ?Neurological:  ?   Mental Status: She is alert and oriented to person, place, and time.  ?   Coordination: Coordination normal.  ?Psychiatric:     ?   Behavior: Behavior normal.  ? ? ?Urinalysis: Greater than 30 WBCs, 0-2 RBCs, 0-10 epithelial cells, many bacteria, nitrite positive, 2+ blood and 2+ leukocytes ? ?Assessment & Plan:  ? ?Problem List Items Addressed This Visit   ?None ?Visit Diagnoses   ? ? Acute cystitis without hematuria    -  Primary  ?  Relevant Medications  ? doxycycline (VIBRA-TABS) 100 MG tablet  ? Other Relevant Orders  ? Urinalysis, Routine w reflex microscopic  ? Urine Culture  ? Acute non-recurrent frontal sinusitis      ? Relevant Medications  ? doxycycline (VIBRA-TABS) 100 MG tablet  ? ?  ?We will treat with doxycycline to cover both the respiratory and urinary symptoms.  Return if not improved ? ?Follow up plan: ?Return if symptoms worsen or fail to improve. ? ?Counseling provided for all of the vaccine components ?Orders Placed This Encounter  ?Procedures  ? Urine Culture  ? Urinalysis, Routine w reflex microscopic  ? ? ?Caryl Pina, MD ?[place] ?01/03/2022, 2:42 PM ? ? ?  ?

## 2022-01-07 LAB — URINE CULTURE

## 2022-01-19 ENCOUNTER — Ambulatory Visit (INDEPENDENT_AMBULATORY_CARE_PROVIDER_SITE_OTHER): Payer: Medicare HMO | Admitting: Emergency Medicine

## 2022-01-19 DIAGNOSIS — E538 Deficiency of other specified B group vitamins: Secondary | ICD-10-CM

## 2022-02-21 ENCOUNTER — Ambulatory Visit (INDEPENDENT_AMBULATORY_CARE_PROVIDER_SITE_OTHER): Payer: Medicare HMO | Admitting: *Deleted

## 2022-02-21 DIAGNOSIS — E538 Deficiency of other specified B group vitamins: Secondary | ICD-10-CM

## 2022-03-23 ENCOUNTER — Ambulatory Visit (INDEPENDENT_AMBULATORY_CARE_PROVIDER_SITE_OTHER): Payer: Medicare HMO

## 2022-03-23 DIAGNOSIS — E538 Deficiency of other specified B group vitamins: Secondary | ICD-10-CM

## 2022-03-23 NOTE — Progress Notes (Signed)
Cyanocobalamin injection given to left deltoid.  Patient tolerated well. 

## 2022-03-24 DIAGNOSIS — Z96651 Presence of right artificial knee joint: Secondary | ICD-10-CM | POA: Diagnosis not present

## 2022-03-30 DIAGNOSIS — Z96651 Presence of right artificial knee joint: Secondary | ICD-10-CM | POA: Diagnosis not present

## 2022-04-02 ENCOUNTER — Other Ambulatory Visit: Payer: Self-pay | Admitting: Nurse Practitioner

## 2022-04-02 DIAGNOSIS — G629 Polyneuropathy, unspecified: Secondary | ICD-10-CM

## 2022-04-02 DIAGNOSIS — F5101 Primary insomnia: Secondary | ICD-10-CM

## 2022-04-08 ENCOUNTER — Ambulatory Visit (INDEPENDENT_AMBULATORY_CARE_PROVIDER_SITE_OTHER): Payer: Medicare HMO | Admitting: Nurse Practitioner

## 2022-04-08 ENCOUNTER — Encounter: Payer: Self-pay | Admitting: Nurse Practitioner

## 2022-04-08 VITALS — BP 117/67 | HR 70 | Temp 97.5°F | Resp 20 | Ht 64.0 in | Wt 164.0 lb

## 2022-04-08 DIAGNOSIS — R69 Illness, unspecified: Secondary | ICD-10-CM | POA: Diagnosis not present

## 2022-04-08 DIAGNOSIS — G629 Polyneuropathy, unspecified: Secondary | ICD-10-CM | POA: Diagnosis not present

## 2022-04-08 DIAGNOSIS — G2581 Restless legs syndrome: Secondary | ICD-10-CM

## 2022-04-08 DIAGNOSIS — M80079D Age-related osteoporosis with current pathological fracture, unspecified ankle and foot, subsequent encounter for fracture with routine healing: Secondary | ICD-10-CM

## 2022-04-08 DIAGNOSIS — D649 Anemia, unspecified: Secondary | ICD-10-CM | POA: Diagnosis not present

## 2022-04-08 DIAGNOSIS — E039 Hypothyroidism, unspecified: Secondary | ICD-10-CM

## 2022-04-08 DIAGNOSIS — E785 Hyperlipidemia, unspecified: Secondary | ICD-10-CM

## 2022-04-08 DIAGNOSIS — F3342 Major depressive disorder, recurrent, in full remission: Secondary | ICD-10-CM

## 2022-04-08 DIAGNOSIS — R300 Dysuria: Secondary | ICD-10-CM | POA: Diagnosis not present

## 2022-04-08 DIAGNOSIS — F411 Generalized anxiety disorder: Secondary | ICD-10-CM

## 2022-04-08 DIAGNOSIS — Z6831 Body mass index (BMI) 31.0-31.9, adult: Secondary | ICD-10-CM

## 2022-04-08 DIAGNOSIS — F5101 Primary insomnia: Secondary | ICD-10-CM

## 2022-04-08 LAB — URINALYSIS, COMPLETE
Bilirubin, UA: NEGATIVE
Glucose, UA: NEGATIVE
Ketones, UA: NEGATIVE
Leukocytes,UA: NEGATIVE
Nitrite, UA: NEGATIVE
Protein,UA: NEGATIVE
Specific Gravity, UA: 1.01 (ref 1.005–1.030)
Urobilinogen, Ur: 0.2 mg/dL (ref 0.2–1.0)
pH, UA: 6 (ref 5.0–7.5)

## 2022-04-08 LAB — MICROSCOPIC EXAMINATION
Bacteria, UA: NONE SEEN
RBC, Urine: NONE SEEN /hpf (ref 0–2)

## 2022-04-08 MED ORDER — SERTRALINE HCL 50 MG PO TABS: 50.0000 mg | ORAL_TABLET | Freq: Every day | ORAL | 1 refills | Status: DC

## 2022-04-08 MED ORDER — ALPRAZOLAM 0.25 MG PO TABS: 0.2500 mg | ORAL_TABLET | Freq: Two times a day (BID) | ORAL | 5 refills | Status: DC | PRN

## 2022-04-08 MED ORDER — DICLOFENAC SODIUM 75 MG PO TBEC: 75.0000 mg | DELAYED_RELEASE_TABLET | Freq: Two times a day (BID) | ORAL | 1 refills | Status: DC

## 2022-04-08 MED ORDER — EZETIMIBE 10 MG PO TABS: 10.0000 mg | ORAL_TABLET | Freq: Every day | ORAL | 1 refills | Status: DC

## 2022-04-08 MED ORDER — LEVOTHYROXINE SODIUM 75 MCG PO TABS: 75.0000 ug | ORAL_TABLET | Freq: Every day | ORAL | 1 refills | Status: DC

## 2022-04-08 MED ORDER — ROPINIROLE HCL 0.25 MG PO TABS: 0.2500 mg | ORAL_TABLET | Freq: Three times a day (TID) | ORAL | 1 refills | Status: DC

## 2022-04-08 MED ORDER — TRAZODONE HCL 50 MG PO TABS: ORAL_TABLET | ORAL | 1 refills | Status: DC

## 2022-04-08 MED ORDER — GABAPENTIN 100 MG PO CAPS: ORAL_CAPSULE | ORAL | 1 refills | Status: DC

## 2022-04-08 MED ORDER — LETROZOLE 2.5 MG PO TABS: 2.5000 mg | ORAL_TABLET | Freq: Every day | ORAL | 1 refills | Status: AC

## 2022-04-08 NOTE — Addendum Note (Signed)
Addended by: Chevis Pretty on: 04/08/2022 08:59 AM   Modules accepted: Orders

## 2022-04-08 NOTE — Progress Notes (Addendum)
Subjective:    Patient ID: Phyllis Walker, female    DOB: [birth year], 76 y.o.   MRN: [id number]   Chief Complaint: Medical Management of Chronic Issues    HPI:  Phyllis Walker is a 76 y.o. who identifies as a female who was assigned female at birth.   Social history: Lives with: husband Work history: retired   Scientist, forensic in today for follow up of the following chronic medical issues:  1. Dysuria Started a couple of days ago.  2. Acquired hypothyroidism No problems that aware of. Lab Results  Component Value Date   TSH 0.802 10/11/2021     3. Hyperlipidemia with target LDL less than 100 Does try to watch diet but does no dedicated exercise. Lab Results  Component Value Date   CHOL 221 (H) 10/11/2021   HDL 64 10/11/2021   LDLCALC 140 (H) 10/11/2021   TRIG 95 10/11/2021   CHOLHDL 3.5 10/11/2021     4. Neuropathy Numbness and tingling in bil feet. If does not take gabapentin then it really bother shim.  5. Anemia, unspecified type Lab Results  Component Value Date   HGB 12.7 10/11/2021     6. Restless leg syndrome Takes requip and that helps. If she did not take it she cannot lkeep her legs still.  7. Recurrent major depressive disorder, in full remission ([place]) Is no longer on anything for depression.    04/08/2022    8:31 AM 01/03/2022    2:27 PM 10/11/2021    8:11 AM  Depression screen PHQ 2/9  Decreased Interest 0 0 0  Down, Depressed, Hopeless 0 0 0  PHQ - 2 Score 0 0 0  Altered sleeping 0 1 0  Tired, decreased energy 1 1 0  Change in appetite 1 1 0  Feeling bad or failure about yourself  0 0 0  Trouble concentrating 0 1 0  Moving slowly or fidgety/restless 0 1 0  Suicidal thoughts 0 0 0  PHQ-9 Score 2 5 0  Difficult doing work/chores Not difficult at all  Not difficult at all     8. Primary insomnia Takes trazadone to sleep and sleeps about 7-8 hours a night.  9. GAD (generalized anxiety disorder) Is on xanax BID.     04/08/2022    8:31 AM 01/03/2022    2:28 PM 10/11/2021    8:11 AM 04/08/2021    8:01 AM  GAD 7 : Generalized Anxiety Score  Nervous, Anxious, on Edge 0 1 0 0  Control/stop worrying 0 0 0 0  Worry too much - different things 0 0 0 0  Trouble relaxing 0 0 0 0  Restless 0 0 0 0  Easily annoyed or irritable 0 0 0 0  Afraid - awful might happen 0 0 0 0  Total GAD 7 Score 0 1 0 0  Anxiety Difficulty Not difficult at all  Not difficult at all Not difficult at all      10. Osteoporosis with pathological fracture of ankle and foot, unspecified laterality, with routine healing, subsequent encounter Last dexascan was done on 09/27/13. She is no longer doing dexascans  11. BMI 31.0-31.9,adult No recent weight changes Wt Readings from Last 3 Encounters:  04/08/22 164 lb (74.4 kg)  01/03/22 161 lb 12.8 oz (73.4 kg)  10/11/21 162 lb (73.5 kg)   BMI Readings from Last 3 Encounters:  04/08/22 28.15 kg/m  01/03/22 27.77 kg/m  10/11/21 27.81 kg/m      New complaints:  None today  Allergies  Allergen Reactions   Amoxicillin-Pot Clavulanate Nausea And Vomiting and Other (See Comments)    STOMACH CRAMPS  Has patient had a PCN reaction causing immediate rash, facial/tongue/throat swelling, SOB or lightheadedness with hypotension: No Has patient had a PCN reaction causing severe rash involving mucus membranes or skin necrosis: No Has patient had a PCN reaction that required hospitalization No Has patient had a PCN reaction occurring within the last 10 years: Yes If all of the above answers are "NO", then may proceed with Cephalosporin use.    Outpatient Encounter Medications as of 04/08/2022  Medication Sig   ALPRAZolam (XANAX) 0.25 MG tablet Take 1 tablet (0.25 mg total) by mouth 2 (two) times daily as needed for anxiety.   ezetimibe (ZETIA) 10 MG tablet Take 1 tablet (10 mg total) by mouth daily.   gabapentin (NEURONTIN) 100 MG capsule TAKE ONE CAPSULE THREE TIMES DAILY   letrozole  (FEMARA) 2.5 MG tablet TAKE 1 TABLET DAILY   levothyroxine (SYNTHROID) 75 MCG tablet Take 1 tablet (75 mcg total) by mouth daily.   rOPINIRole (REQUIP) 0.25 MG tablet Take 1 tablet (0.25 mg total) by mouth 3 (three) times daily.   sertraline (ZOLOFT) 50 MG tablet Take 1 tablet (50 mg total) by mouth daily.   traZODone (DESYREL) 50 MG tablet TAKE ONE-HALF TO 1 TABLET AT BEDTIME AS NEEDED FOR SLEEP   [DISCONTINUED] albuterol (VENTOLIN HFA) 108 (90 Base) MCG/ACT inhaler Inhale 2 puffs into the lungs every 6 (six) hours as needed for wheezing or shortness of breath.   [DISCONTINUED] doxycycline (VIBRA-TABS) 100 MG tablet Take 1 tablet (100 mg total) by mouth 2 (two) times daily. 1 po bid   diclofenac (VOLTAREN) 75 MG EC tablet Take 75 mg by mouth 2 (two) times daily.   Facility-Administered Encounter Medications as of 04/08/2022  Medication   cyanocobalamin ((VITAMIN B-12)) injection 1,000 mcg   methylPREDNISolone acetate (DEPO-MEDROL) injection 80 mg    Past Surgical History:  Procedure Laterality Date   BREAST LUMPECTOMY  1987   benign fibroid tumor   BREAST LUMPECTOMY WITH RADIOACTIVE SEED AND SENTINEL LYMPH NODE BIOPSY Right 07/05/2018   Procedure: RIGHT BREAST LUMPECTOMY WITH RADIOACTIVE SEED X 2 AND RIGHT AXILLARY SENTINEL LYMPH NODE BIOPSY;  Surgeon: Alphonsa Overall, MD;  Location: [facility];  Service: General;  Laterality: Right;   BUNIONECTOMY Bilateral    CATARACT EXTRACTION     COLONOSCOPY     THYROIDECTOMY, PARTIAL  1969   TONSILLECTOMY     TOTAL KNEE ARTHROPLASTY Right 08/15/2016   Procedure: TOTAL KNEE ARTHROPLASTY;  Surgeon: Vickey Huger, MD;  Location: [facility];  Service: Orthopedics;  Laterality: Right;  RNFA    Family History  Problem Relation Age of Onset   Cancer Mother        pancreas   Heart attack Father    Hypertension Sister    Hyperlipidemia Sister    Anuerysm Sister    Hypertension Sister    Hyperlipidemia Sister    Early death Sister 74        accident   Ovarian cancer Maternal Aunt       Controlled substance contract: n/a     Review of Systems  Constitutional:  Negative for diaphoresis.  Eyes:  Negative for pain.  Respiratory:  Negative for shortness of breath.   Cardiovascular:  Negative for chest pain, palpitations and leg swelling.  Gastrointestinal:  Negative for abdominal pain.  Endocrine: Negative for polydipsia.  Skin:  Negative  for rash.  Neurological:  Negative for dizziness, weakness and headaches.  Hematological:  Does not bruise/bleed easily.  All other systems reviewed and are negative.      Objective:   Physical Exam Vitals and nursing note reviewed.  Constitutional:      General: She is not in acute distress.    Appearance: Normal appearance. She is well-developed.  HENT:     Head: Normocephalic.     Right Ear: Tympanic membrane normal.     Left Ear: Tympanic membrane normal.     Nose: Nose normal.     Mouth/Throat:     Mouth: Mucous membranes are moist.  Eyes:     Pupils: Pupils are equal, round, and reactive to light.  Neck:     Vascular: No carotid bruit or JVD.  Cardiovascular:     Rate and Rhythm: Normal rate and regular rhythm.     Heart sounds: Normal heart sounds.  Pulmonary:     Effort: Pulmonary effort is normal. No respiratory distress.     Breath sounds: Normal breath sounds. No wheezing or rales.  Chest:     Chest wall: No tenderness.  Abdominal:     General: Bowel sounds are normal. There is no distension or abdominal bruit.     Palpations: Abdomen is soft. There is no hepatomegaly, splenomegaly, mass or pulsatile mass.     Tenderness: There is no abdominal tenderness.  Musculoskeletal:        General: Normal range of motion.     Cervical back: Normal range of motion and neck supple.  Lymphadenopathy:     Cervical: No cervical adenopathy.  Skin:    General: Skin is warm and dry.  Neurological:     Mental Status: She is alert and oriented to person, place, and time.      Deep Tendon Reflexes: Reflexes are normal and symmetric.  Psychiatric:        Behavior: Behavior normal.        Thought Content: Thought content normal.        Judgment: Judgment normal.    BP 117/67   Pulse 70   Temp (!) 97.5 F (36.4 C) (Temporal)   Resp 20   Ht _0  (1.626 m)   Wt 164 lb (74.4 kg)   SpO2 96%   BMI 28.15 kg/m         Assessment & Plan:   Zakari Couchman comes in today with chief complaint of Medical Management of Chronic Issues   Diagnosis and orders addressed:  1. Dysuria Urine clear Force fluids - Urinalysis, Complete  2. Acquired hypothyroidism Labs pending - levothyroxine (SYNTHROID) 75 MCG tablet; Take 1 tablet (75 mcg total) by mouth daily.  Dispense: 90 tablet; Refill: 1  3. Hyperlipidemia with target LDL less than 100 Low fat diet - ezetimibe (ZETIA) 10 MG tablet; Take 1 tablet (10 mg total) by mouth daily.  Dispense: 90 tablet; Refill: 1  4. Neuropathy Do not go barefooted - gabapentin (NEURONTIN) 100 MG capsule; TAKE ONE CAPSULE THREE TIMES DAILY  Dispense: 270 capsule; Refill: 1  5. Anemia, unspecified type Labs pending  6. Restless leg syndrome Keep legs warm at night - rOPINIRole (REQUIP) 0.25 MG tablet; Take 1 tablet (0.25 mg total) by mouth 3 (three) times daily.  Dispense: 270 tablet; Refill: 1  7. Recurrent major depressive disorder, in full remission ([place]) Stress management - sertraline (ZOLOFT) 50 MG tablet; Take 1 tablet (50 mg total) by mouth daily.  Dispense:  90 tablet; Refill: 1  8. Primary insomnia Bedtime routine - traZODone (DESYREL) 50 MG tablet; TAKE ONE-HALF TO 1 TABLET AT BEDTIME AS NEEDED FOR SLEEP  Dispense: 90 tablet; Refill: 1  9. GAD (generalized anxiety disorder) - ALPRAZolam (XANAX) 0.25 MG tablet; Take 1 tablet (0.25 mg total) by mouth 2 (two) times daily as needed for anxiety.  Dispense: 60 tablet; Refill: 5  10. Osteoporosis with pathological fracture of ankle and foot, unspecified  laterality, with routine healing, subsequent encounter No more dexascans Encouraged weight bearing exercises  11. BMI 31.0-31.9,adult Discussed diet and exercise for person with BMI >25 Will recheck weight in 3-6 months  Orders Placed This Encounter  Procedures   Urinalysis, Complete   CBC with Differential/Platelet   CMP14+EGFR   Lipid panel   Thyroid Panel With TSH     Labs pending Health Maintenance reviewed Diet and exercise encouraged  Follow up plan: 6 months   Arist[place], FNP

## 2022-04-08 NOTE — Patient Instructions (Signed)
Exercising to Stay Healthy To become healthy and stay healthy, it is recommended that you do moderate-intensity and vigorous-intensity exercise. You can tell that you are exercising at a moderate intensity if your heart starts beating faster and you start breathing faster but can still hold a conversation. You can tell that you are exercising at a vigorous intensity if you are breathing much harder and faster and cannot hold a conversation while exercising. How can exercise benefit me? Exercising regularly is important. It has many health benefits, such as: Improving overall fitness, flexibility, and endurance. Increasing bone density. Helping with weight control. Decreasing body fat. Increasing muscle strength and endurance. Reducing stress and tension, anxiety, depression, or anger. Improving overall health. What guidelines should I follow while exercising? Before you start a new exercise program, talk with your health care provider. Do not exercise so much that you hurt yourself, feel dizzy, or get very short of breath. Wear comfortable clothes and wear shoes with good support. Drink plenty of water while you exercise to prevent dehydration or heat stroke. Work out until your breathing and your heartbeat get faster (moderate intensity). How often should I exercise? Choose an activity that you enjoy, and set realistic goals. Your health care provider can help you make an activity plan that is individually designed and works best for you. Exercise regularly as told by your health care provider. This may include: Doing strength training two times a week, such as: Lifting weights. Using resistance bands. Push-ups. Sit-ups. Yoga. Doing a certain intensity of exercise for a given amount of time. Choose from these options: A total of 150 minutes of moderate-intensity exercise every week. A total of 75 minutes of vigorous-intensity exercise every week. A mix of moderate-intensity and  vigorous-intensity exercise every week. Children, pregnant women, people who have not exercised regularly, people who are overweight, and older adults may need to talk with a health care provider about what activities are safe to perform. If you have a medical condition, be sure to talk with your health care provider before you start a new exercise program. What are some exercise ideas? Moderate-intensity exercise ideas include: Walking 1 mile (1.6 km) in about 15 minutes. Biking. Hiking. Golfing. Dancing. Water aerobics. Vigorous-intensity exercise ideas include: Walking 4.5 miles (7.2 km) or more in about 1 hour. Jogging or running 5 miles (8 km) in about 1 hour. Biking 10 miles (16.1 km) or more in about 1 hour. Lap swimming. Roller-skating or in-line skating. Cross-country skiing. Vigorous competitive sports, such as football, basketball, and soccer. Jumping rope. Aerobic dancing. What are some everyday activities that can help me get exercise? Yard work, such as: Pushing a lawn mower. Raking and bagging leaves. Washing your car. Pushing a stroller. Shoveling snow. Gardening. Washing windows or floors. How can I be more active in my day-to-day activities? Use stairs instead of an elevator. Take a walk during your lunch break. If you drive, park your car farther away from your work or school. If you take public transportation, get off one stop early and walk the rest of the way. Stand up or walk around during all of your indoor phone calls. Get up, stretch, and walk around every 30 minutes throughout the day. Enjoy exercise with a friend. Support to continue exercising will help you keep a regular routine of activity. Where to find more information You can find more information about exercising to stay healthy from: U.S. Department of Health and Human Services: www.hhs.gov Centers for Disease Control and Prevention (  CDC): www.cdc.gov Summary Exercising regularly is  important. It will improve your overall fitness, flexibility, and endurance. Regular exercise will also improve your overall health. It can help you control your weight, reduce stress, and improve your bone density. Do not exercise so much that you hurt yourself, feel dizzy, or get very short of breath. Before you start a new exercise program, talk with your health care provider. This information is not intended to replace advice given to you by your health care provider. Make sure you discuss any questions you have with your health care provider. Document Revised: 12/25/2020 Document Reviewed: 12/25/2020 Elsevier Patient Education  2023 Elsevier Inc.  

## 2022-04-08 NOTE — Addendum Note (Signed)
Addended by: Chevis Pretty on: 04/08/2022 09:01 AM   Modules accepted: Orders

## 2022-04-09 LAB — CMP14+EGFR
ALT: 10 IU/L (ref 0–32)
AST: 22 IU/L (ref 0–40)
Albumin/Globulin Ratio: 1.7 (ref 1.2–2.2)
Albumin: 4.1 g/dL (ref 3.8–4.8)
Alkaline Phosphatase: 101 IU/L (ref 44–121)
BUN/Creatinine Ratio: 23 (ref 12–28)
BUN: 21 mg/dL (ref 8–27)
Bilirubin Total: 0.3 mg/dL (ref 0.0–1.2)
CO2: 24 mmol/L (ref 20–29)
Calcium: 9 mg/dL (ref 8.7–10.3)
Chloride: 105 mmol/L (ref 96–106)
Creatinine, Ser: 0.9 mg/dL (ref 0.57–1.00)
Globulin, Total: 2.4 g/dL (ref 1.5–4.5)
Glucose: 86 mg/dL (ref 70–99)
Potassium: 4.4 mmol/L (ref 3.5–5.2)
Sodium: 143 mmol/L (ref 134–144)
Total Protein: 6.5 g/dL (ref 6.0–8.5)
eGFR: 66 mL/min/{1.73_m2} (ref >59)

## 2022-04-09 LAB — THYROID PANEL WITH TSH
Free Thyroxine Index: 1.7 (ref 1.2–4.9)
T3 Uptake Ratio: 23 % — ABNORMAL LOW (ref 24–39)
T4, Total: 7.5 ug/dL (ref 4.5–12.0)
TSH: 1.61 u[IU]/mL (ref 0.450–4.500)

## 2022-04-09 LAB — CBC WITH DIFFERENTIAL/PLATELET
Basophils Absolute: 0.1 10*3/uL (ref 0.0–0.2)
Basos: 1 %
EOS (ABSOLUTE): 0.4 10*3/uL (ref 0.0–0.4)
Eos: 7 %
Hematocrit: 33.5 % — ABNORMAL LOW (ref 34.0–46.6)
Hemoglobin: 10.8 g/dL — ABNORMAL LOW (ref 11.1–15.9)
Immature Grans (Abs): 0 10*3/uL (ref 0.0–0.1)
Immature Granulocytes: 0 %
Lymphocytes Absolute: 1.4 10*3/uL (ref 0.7–3.1)
Lymphs: 25 %
MCH: 28.7 pg (ref 26.6–33.0)
MCHC: 32.2 g/dL (ref 31.5–35.7)
MCV: 89 fL (ref 79–97)
Monocytes Absolute: 0.3 10*3/uL (ref 0.1–0.9)
Monocytes: 6 %
Neutrophils Absolute: 3.6 10*3/uL (ref 1.4–7.0)
Neutrophils: 61 %
Platelets: 227 10*3/uL (ref 150–450)
RBC: 3.76 x10E6/uL — ABNORMAL LOW (ref 3.77–5.28)
RDW: 12.1 % (ref 11.7–15.4)
WBC: 5.8 10*3/uL (ref 3.4–10.8)

## 2022-04-09 LAB — LIPID PANEL
Chol/HDL Ratio: 4.1 ratio (ref 0.0–4.4)
Cholesterol, Total: 214 mg/dL — ABNORMAL HIGH (ref 100–199)
HDL: 52 mg/dL (ref >39)
LDL Chol Calc (NIH): 143 mg/dL — ABNORMAL HIGH (ref 0–99)
Triglycerides: 108 mg/dL (ref 0–149)
VLDL Cholesterol Cal: 19 mg/dL (ref 5–40)

## 2022-04-25 ENCOUNTER — Ambulatory Visit (INDEPENDENT_AMBULATORY_CARE_PROVIDER_SITE_OTHER): Payer: Medicare HMO

## 2022-04-25 DIAGNOSIS — E538 Deficiency of other specified B group vitamins: Secondary | ICD-10-CM

## 2022-04-25 NOTE — Progress Notes (Signed)
Cyanocobalamin injection given to left deltoid.  Patient tolerated well. 

## 2022-05-02 ENCOUNTER — Other Ambulatory Visit: Payer: Self-pay | Admitting: Physician Assistant

## 2022-05-02 DIAGNOSIS — D649 Anemia, unspecified: Secondary | ICD-10-CM | POA: Diagnosis not present

## 2022-05-02 DIAGNOSIS — Z86010 Personal history of colonic polyps: Secondary | ICD-10-CM | POA: Diagnosis not present

## 2022-05-02 DIAGNOSIS — R1310 Dysphagia, unspecified: Secondary | ICD-10-CM

## 2022-05-03 ENCOUNTER — Ambulatory Visit
Admission: RE | Admit: 2022-05-03 | Discharge: 2022-05-03 | Disposition: A | Discharge status: Home / Self Care | Payer: Medicare HMO | Source: Ambulatory Visit | Attending: Physician Assistant | Admitting: Physician Assistant

## 2022-05-03 DIAGNOSIS — R1310 Dysphagia, unspecified: Secondary | ICD-10-CM

## 2022-05-03 DIAGNOSIS — K219 Gastro-esophageal reflux disease without esophagitis: Secondary | ICD-10-CM | POA: Diagnosis not present

## 2022-05-03 DIAGNOSIS — K224 Dyskinesia of esophagus: Secondary | ICD-10-CM | POA: Diagnosis not present

## 2022-05-26 ENCOUNTER — Ambulatory Visit (INDEPENDENT_AMBULATORY_CARE_PROVIDER_SITE_OTHER): Payer: Medicare HMO

## 2022-05-26 DIAGNOSIS — E538 Deficiency of other specified B group vitamins: Secondary | ICD-10-CM | POA: Diagnosis not present

## 2022-05-26 NOTE — Progress Notes (Signed)
B12 injection given to patient and tolerated well.  

## 2022-06-10 ENCOUNTER — Other Ambulatory Visit: Payer: Self-pay | Admitting: Nurse Practitioner

## 2022-06-10 DIAGNOSIS — I10 Essential (primary) hypertension: Secondary | ICD-10-CM

## 2022-06-27 ENCOUNTER — Ambulatory Visit (INDEPENDENT_AMBULATORY_CARE_PROVIDER_SITE_OTHER): Payer: Medicare HMO

## 2022-06-27 ENCOUNTER — Ambulatory Visit (INDEPENDENT_AMBULATORY_CARE_PROVIDER_SITE_OTHER): Payer: Medicare HMO | Admitting: Nurse Practitioner

## 2022-06-27 ENCOUNTER — Encounter: Payer: Self-pay | Admitting: Nurse Practitioner

## 2022-06-27 VITALS — BP 109/69 | HR 78 | Temp 97.5°F | Resp 20 | Ht 64.0 in | Wt 165.0 lb

## 2022-06-27 DIAGNOSIS — Z23 Encounter for immunization: Secondary | ICD-10-CM

## 2022-06-27 DIAGNOSIS — R0989 Other specified symptoms and signs involving the circulatory and respiratory systems: Secondary | ICD-10-CM

## 2022-06-27 DIAGNOSIS — K449 Diaphragmatic hernia without obstruction or gangrene: Secondary | ICD-10-CM | POA: Diagnosis not present

## 2022-06-27 NOTE — Progress Notes (Signed)
   Subjective:    Patient ID: Phyllis Walker, female    DOB: [birth year], 76 y.o.   MRN: [id number]   Chief Complaint: lungs checked  HPI  Patient comes in wanting lungs checked. She had a CT scan which showed resolved infection. She went to see GI to schedule colonoscopy. They sent her for xryas and they thought she suded cogested and they wanted her to be seen by PCP prior to any procedures. She feels fine. Denies any cough. She does have some congestion , but that is normal for her.    Review of Systems  Constitutional:  Negative for diaphoresis.  HENT:  Positive for congestion.   Eyes:  Negative for pain.  Respiratory:  Negative for shortness of breath.   Cardiovascular:  Negative for chest pain, palpitations and leg swelling.  Gastrointestinal:  Negative for abdominal pain.  Endocrine: Negative for polydipsia.  Skin:  Negative for rash.  Neurological:  Negative for dizziness, weakness and headaches.  Hematological:  Does not bruise/bleed easily.  All other systems reviewed and are negative.      Objective:   Physical Exam Vitals reviewed.  Constitutional:      Appearance: Normal appearance.  Cardiovascular:     Rate and Rhythm: Normal rate and regular rhythm.     Heart sounds: Normal heart sounds.  Pulmonary:     Effort: Pulmonary effort is normal.     Breath sounds: Rales (inspiratory rales anteriorly) present.  Skin:    General: Skin is warm.  Neurological:     General: No focal deficit present.     Mental Status: She is alert and oriented to person, place, and time.  Psychiatric:        Mood and Affect: Mood normal.        Behavior: Behavior normal.    BP 109/69   Pulse 78   Temp (!) 97.5 F (36.4 C) (Temporal)   Resp 20   Ht '5\' 4"'$  (1.626 m)   Wt 165 lb (74.8 kg)   SpO2 94%   BMI 28.32 kg/m   Chest xray- normal-Preliminary reading by Ronnald Collum, FNP  [facility]       Assessment & Plan:   Alvis Lemmings Vasil in today with chief complaint of  Check lungs   1. Rales Mucinex OTC Ok for procedure - DG Chest 2 View    The above assessment and management plan was discussed with the patient. The patient verbalized understanding of and has agreed to the management plan. Patient is aware to call the clinic if symptoms persist or worsen. Patient is aware when to return to the clinic for a follow-up visit. Patient educated on when it is appropriate to go to the emergency department.   Mary-Margaret Hassell Done, FNP

## 2022-07-05 ENCOUNTER — Other Ambulatory Visit: Payer: Self-pay | Admitting: Nurse Practitioner

## 2022-07-07 ENCOUNTER — Ambulatory Visit (INDEPENDENT_AMBULATORY_CARE_PROVIDER_SITE_OTHER): Payer: Medicare HMO

## 2022-07-07 DIAGNOSIS — Z0000 Encounter for general adult medical examination without abnormal findings: Secondary | ICD-10-CM | POA: Diagnosis not present

## 2022-07-07 NOTE — Patient Instructions (Signed)
Phyllis Walker , Thank you for taking time to come for your Medicare Wellness Visit. I appreciate your ongoing commitment to your health goals. Please review the following plan we discussed and let me know if I can assist you in the future.   These are the goals we discussed:  Goals       DIET - INCREASE WATER INTAKE      Try to drink 6-8 glasses of water daily.      Exercise 3x per week (30 min per time)      Hyperlipidemia-PharmD Goal (pt-stated)      Current Barriers:  Unable to achieve control of HYPERLIPIDEMIA  Suboptimal therapeutic regimen for HYPERLIPIDEMIA  Pharmacist Clinical Goal(s):  patient will verbalize ability to afford treatment regimen achieve control of HYPERLIPIDEMIA as evidenced by DECREASED LDL IN 6 MONTHS through collaboration with PharmD and provider.   Interventions: 1:1 collaboration with Chevis Pretty, FNP regarding development and update of comprehensive plan of care as evidenced by provider attestation and co-signature Inter-disciplinary care team collaboration (see longitudinal plan of care) Comprehensive medication review performed; medication list updated in electronic medical record  Hyperlipidemia:  New goal. Uncontrolled; current treatment: generic zetia '10mg'$  (started 1 month ago, tolerating well, affordable);  Intolerances: STATINS Risk Factors: HTN, former smoker (67 pk yr)  Family History: HLD (parents) Social History: FORMER smoker (80 PACK YEAR HISTORY)/no alcohol  LDL goal: <100 LFTs wnl Diet: handout provided on dietary recommendations in patient with hyperlipidemia  Exercise: walks Lipid Panel     Component Value Date/Time   CHOL 221 (H) 04/08/2021 0845   CHOL 183 12/21/2012 1056   TRIG 96 04/08/2021 0845   TRIG 97 11/20/2014 1128   TRIG 116 12/21/2012 1056   HDL 56 04/08/2021 0845   HDL 57 11/20/2014 1128   HDL 55 12/21/2012 1056   CHOLHDL 3.9 04/08/2021 0845   LDLCALC 148 (H) 04/08/2021 0845   LDLCALC 88 05/12/2014 0933    LDLCALC 105 (H) 12/21/2012 1056   LABVLDL 17 04/08/2021 0845  1. Hyperlipidemia - Patient refuses statin (including alternative dosing), injectables, etc--she would like to improve diet/lifestyle to aid in lipid lowering Patient agreeable to CONTINUE on ZETIA '10mg'$  daily Will reach out in January to see if additional hyperlipidemia treatment is needed; can attempt to enroll in healthwell grant ( all other HLD options will be costly)   Patient Goals/Self-Care Activities patient will:  - take medications as prescribed collaborate with provider on medication access solutions  Follow Up Plan: Telephone follow up appointment with care management team member scheduled for: 09/2021       Patient Stated      07/07/2022 AWV Goal: Fall Prevention  Over the next year, patient will decrease their risk for falls by: Using assistive devices, such as a cane or walker, as needed Identifying fall risks within their home and correcting them by: Removing throw rugs Adding handrails to stairs or ramps Removing clutter and keeping a clear pathway throughout the home Increasing light, especially at night Adding shower handles/bars Raising toilet seat Identifying potential personal risk factors for falls: Medication side effects Incontinence/urgency Vestibular dysfunction Hearing loss Musculoskeletal disorders Neurological disorders Orthostatic hypotension        Weight (lb) < 160 lb (72.6 kg)      Weight < 170 lb (77.111 kg)        This is a list of the screening recommended for you and due dates:  Health Maintenance  Topic Date Due   Medicare Annual  Wellness Visit  07/17/2022   COVID-19 Vaccine (5 - Moderna risk series) 07/13/2022*   DEXA scan (bone density measurement)  10/11/2022*   Colon Cancer Screening  06/28/2023*   Mammogram  09/27/2022   Tetanus Vaccine  12/30/2026   Pneumonia Vaccine  Completed   Flu Shot  Completed   Hepatitis C Screening: USPSTF Recommendation to screen  - Ages 18-79 yo.  Completed   Zoster (Shingles) Vaccine  Completed   HPV Vaccine  Aged Out  *Topic was postponed. The date shown is not the original due date.

## 2022-07-07 NOTE — Progress Notes (Signed)
Subjective:   Phyllis Walker is a 76 y.o. female who presents for Medicare Annual (Subsequent) preventive examination.  I connected with  Phyllis Walker on 07/07/22 by a audio enabled telemedicine application and verified that I am speaking with the correct person using two identifiers.  Patient Location: Home  Provider Location: Office/Clinic  I discussed the limitations of evaluation and management by telemedicine. The patient expressed understanding and agreed to proceed.   Review of Systems    Defer to PCP  Cardiac Risk Factors include: advanced age (>61mn, >>52women);dyslipidemia;sedentary lifestyle     Objective:    There were no vitals filed for this visit. There is no height or weight on file to calculate BMI.     07/07/2022   11:57 AM 06/17/2021   10:40 AM 03/26/2019    9:53 AM 07/05/2018    6:45 AM 07/02/2018    9:48 AM 06/12/2018    9:37 AM 01/02/2018   11:05 AM  Advanced Directives  Does Patient Have a Medical Advance Directive? No Yes No No No Yes Yes  Type of ASocial research officer, governmentLiving will    Healthcare Power of Attorney Living will  Copy of HFroidin Chart?  No - copy requested    No - copy requested   Would patient like information on creating a medical advance directive? No - Patient declined  No - Patient declined No - Patient declined No - Patient declined No - Patient declined     Current Medications (verified) Outpatient Encounter Medications as of 07/07/2022  Medication Sig   ALPRAZolam (XANAX) 0.25 MG tablet Take 1 tablet (0.25 mg total) by mouth 2 (two) times daily as needed for anxiety.   diclofenac (VOLTAREN) 75 MG EC tablet Take 75 mg by mouth 2 (two) times daily.   diclofenac (VOLTAREN) 75 MG EC tablet Take 1 tablet (75 mg total) by mouth 2 (two) times daily.   ezetimibe (ZETIA) 10 MG tablet Take 1 tablet (10 mg total) by mouth daily.   gabapentin (NEURONTIN) 100 MG capsule TAKE  ONE CAPSULE THREE TIMES DAILY   levothyroxine (SYNTHROID) 75 MCG tablet Take 1 tablet (75 mcg total) by mouth daily.   rOPINIRole (REQUIP) 0.25 MG tablet Take 1 tablet (0.25 mg total) by mouth 3 (three) times daily.   sertraline (ZOLOFT) 50 MG tablet Take 1 tablet (50 mg total) by mouth daily.   traZODone (DESYREL) 50 MG tablet TAKE ONE-HALF TO 1 TABLET AT BEDTIME AS NEEDED FOR SLEEP   letrozole (FEMARA) 2.5 MG tablet Take 1 tablet (2.5 mg total) by mouth daily. (Patient not taking: Reported on 07/07/2022)   Facility-Administered Encounter Medications as of 07/07/2022  Medication   cyanocobalamin ((VITAMIN B-12)) injection 1,000 mcg   methylPREDNISolone acetate (DEPO-MEDROL) injection 80 mg    Allergies (verified) Amoxicillin-pot clavulanate   History: Past Medical History:  Diagnosis Date   Anemia    Anxiety    Arthritis    [place]   Cataract    Depression    Fibroid tumor 1985   in breast   History of hiatal hernia    Hypercholesteremia    Hypertension    Hypothyroidism    Neuropathy    in feet   Restless leg syndrome    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   Past Surgical History:  Procedure Laterality Date   BREAST LUMPECTOMY  1987   benign fibroid tumor   BREAST  LUMPECTOMY WITH RADIOACTIVE SEED AND SENTINEL LYMPH NODE BIOPSY Right 07/05/2018   Procedure: RIGHT BREAST LUMPECTOMY WITH RADIOACTIVE SEED X 2 AND RIGHT AXILLARY SENTINEL LYMPH NODE BIOPSY;  Surgeon: Alphonsa Overall, MD;  Location: [place];  Service: General;  Laterality: Right;   BUNIONECTOMY Bilateral    CATARACT EXTRACTION     COLONOSCOPY     THYROIDECTOMY, PARTIAL  1969   TONSILLECTOMY     TOTAL KNEE ARTHROPLASTY Right 08/15/2016   Procedure: TOTAL KNEE ARTHROPLASTY;  Surgeon: Vickey Huger, MD;  Location: [place];  Service: Orthopedics;  Laterality: Right;  RNFA   Family History  Problem Relation Age of Onset   Cancer Mother        pancreas    Heart attack Father    Hypertension Sister    Hyperlipidemia Sister    Anuerysm Sister    Hypertension Sister    Hyperlipidemia Sister    Early death Sister 91       accident   Ovarian cancer Maternal Aunt    Social History   Socioeconomic History   Marital status: Married    Spouse name: Not on file   Number of children: 2   Years of education: 12   Highest education level: High school graduate  Occupational History   Occupation: retired  Tobacco Use   Smoking status: Former    Packs/day: 2.00    Years: 40.00    Total pack years: 80.00    Types: Cigarettes    Quit date: 12/21/2006    Years since quitting: 15.5   Smokeless tobacco: Never  Vaping Use   Vaping Use: Never used  Substance and Sexual Activity   Alcohol use: No   Drug use: No   Sexual activity: Not Currently  Other Topics Concern   Not on file  Social History Narrative   Lives with her husband has one cat   One child in [place] and one in [place] Strain: Low Risk  (07/07/2022)   Overall Financial Resource Strain (CARDIA)    Difficulty of Paying Living Expenses: Not hard at all  Food Insecurity: No Food Insecurity (07/07/2022)   Hunger Vital Sign    Worried About Running Out of Food in the Last Year: Never true    [place] in the Last Year: Never true  Transportation Needs: No Transportation Needs (07/07/2022)   PRAPARE - Hydrologist (Medical): No    Lack of Transportation (Non-Medical): No  Physical Activity: Inactive (07/07/2022)   Exercise Vital Sign    Days of Exercise per Week: 0 days    Minutes of Exercise per Session: 0 min  Stress: No Stress Concern Present (07/07/2022)   [place]    Feeling of Stress : Not at all  Social Connections: [place] (07/07/2022)   Social Connection and Isolation Panel [NHANES]    Frequency of  Communication with Friends and Family: More than three times a week    Frequency of Social Gatherings with Friends and Family: More than three times a week    Attends Religious Services: More than 4 times per year    Active Member of [employer] or Organizations: Yes    Attends Music therapist: More than 4 times per year    Marital Status: Married    Tobacco Counseling Counseling given: Not Answered   Clinical Intake:  Pre-visit  preparation completed: Yes  Pain : No/denies pain     Nutritional Risks: None Diabetes: No  How often do you need to have someone help you when you read instructions, pamphlets, or other written materials from your doctor or pharmacy?: 1 - Never What is the last grade level you completed in school?: 12th grade  Diabetic? No  Interpreter Needed?: No  Information entered by :: Felicity Coyer, LPN   Activities of Daily Living    07/07/2022   11:57 AM 07/03/2022    7:54 PM  In your present state of health, do you have any difficulty performing the following activities:  Hearing? 0 0  Vision? 0 0  Difficulty concentrating or making decisions? 0 0  Walking or climbing stairs? 0 0  Dressing or bathing? 0 0  Doing errands, shopping? 0 0  Preparing Food and eating ? N N  Using the Toilet? N N  In the past six months, have you accidently leaked urine? N Y  Do you have problems with loss of bowel control? N N  Managing your Medications? N N  Managing your Finances? N N  Housekeeping or managing your Housekeeping? N N    Patient Care Team: Chevis Pretty, FNP as PCP - General (Nurse Practitioner) Vickey Huger, MD as Consulting Physician (Orthopedic Surgery) Huel Cote, NP (Inactive) as Nurse Practitioner (Obstetrics and Gynecology) Nicholas Lose, MD as Consulting Physician (Hematology and Oncology) Alphonsa Overall, MD as Consulting Physician (General Surgery) Kyung Rudd, MD as Consulting Physician (Radiation Oncology) Lavera Guise, [facility] as Pharmacist (Family Medicine)  Indicate any recent Medical Services you may have received from other than [facility] providers in the past year (date may be approximate).     Assessment:   This is a routine wellness examination for Jaylena.  Hearing/Vision screen No results found.  Dietary issues and exercise activities discussed: Current Exercise Habits: The patient does not participate in regular exercise at present, Exercise limited by: None identified   Goals Addressed             This Visit's Progress    Patient Stated       07/07/2022 AWV Goal: Fall Prevention  Over the next year, patient will decrease their risk for falls by: Using assistive devices, such as a cane or walker, as needed Identifying fall risks within their home and correcting them by: Removing throw rugs Adding handrails to stairs or ramps Removing clutter and keeping a clear pathway throughout the home Increasing light, especially at night Adding shower handles/bars Raising toilet seat Identifying potential personal risk factors for falls: Medication side effects Incontinence/urgency Vestibular dysfunction Hearing loss Musculoskeletal disorders Neurological disorders Orthostatic hypotension         Depression Screen    07/07/2022   11:56 AM 06/27/2022    2:33 PM 04/08/2022    8:31 AM 01/03/2022    2:27 PM 10/11/2021    8:11 AM 06/17/2021   10:34 AM 04/08/2021    8:00 AM  PHQ 2/9 Scores  PHQ - 2 Score 0 0 0 0 0 0 0  PHQ- 9 Score  '1 2 5 '$ 0  0    Fall Risk    07/07/2022   12:00 PM 07/03/2022    7:54 PM 06/27/2022    2:33 PM 04/08/2022    8:31 AM 01/03/2022    2:27 PM  Fall Risk   Falls in the past year? 0 0 0 0 0  Number falls in past yr:  0  Injury with Fall?  0     Follow up Falls evaluation completed        FALL RISK PREVENTION PERTAINING TO THE HOME:  Any stairs in or around the home? Yes  If so, are there any without handrails? No  Home free of loose throw  rugs in walkways, pet beds, electrical cords, etc? No  Adequate lighting in your home to reduce risk of falls? No   ASSISTIVE DEVICES UTILIZED TO PREVENT FALLS:  Life alert? No  Use of a cane, walker or w/c? No  Grab bars in the bathroom? Yes  Shower chair or bench in shower? Yes  Elevated toilet seat or a handicapped toilet? Yes   TIMED UP AND GO:  Was the test performed? No .    Cognitive Function:    01/02/2018   11:09 AM 12/29/2016   11:02 AM 05/27/2015    1:13 PM  MMSE - Mini Mental State Exam  Orientation to time '5 5 5  '$ Orientation to Place '5 5 5  '$ Registration '3 3 3  '$ Attention/ Calculation '5 5 5  '$ Recall '3 3 3  '$ Language- name 2 objects '2 2 2  '$ Language- repeat '1 1 1  '$ Language- follow 3 step command '3 3 3  '$ Language- read & follow direction '1 1 1  '$ Write a sentence '1 1 1  '$ Copy design '1 1 1  '$ Total score '30 30 30        '$ 07/07/2022   11:58 AM 03/26/2019    9:55 AM  6CIT Screen  What Year? 0 points 0 points  What month? 0 points 0 points  What time? 0 points 0 points  Count back from 20 0 points 0 points  Months in reverse 0 points 0 points  Repeat phrase 0 points 0 points  Total Score 0 points 0 points    Immunizations Immunization History  Administered Date(s) Administered   Fluad Quad(high Dose 65+) 06/03/2019, 06/09/2020, 06/07/2021, 06/27/2022   Influenza Split 06/18/2013   Influenza,inj,Quad PF,6+ Mos 06/09/2014, 06/19/2015, 06/10/2017, 07/12/2018   Influenza-Unspecified 06/20/2016   Moderna Covid-19 Vaccine Bivalent Booster 79yr & up 06/29/2021   Moderna Sars-Covid-2 Vaccination 10/08/2019, 11/05/2019, 08/11/2020   Pneumococcal Conjugate-13 11/20/2014   Pneumococcal Polysaccharide-23 09/12/2010   Tdap 12/29/2016   Zoster Recombinat (Shingrix) 04/08/2021, 10/11/2021   Zoster, Live 05/24/2013    TDAP status: Up to date  Flu Vaccine status: Up to date  Pneumococcal vaccine status: Up to date  Covid-19 vaccine status: Completed  vaccines  Qualifies for Shingles Vaccine? Yes   Zostavax completed No   Shingrix Completed?: Yes  Screening Tests Health Maintenance  Topic Date Due   Medicare Annual Wellness (AWV)  07/17/2022   COVID-19 Vaccine (5 - Moderna risk series) 07/13/2022 (Originally 08/24/2021)   DEXA SCAN  10/11/2022 (Originally 05/22/2016)   COLONOSCOPY (Pts 45-457yrInsurance coverage will need to be confirmed)  06/28/2023 (Originally 05/17/2022)   MAMMOGRAM  09/27/2022   TETANUS/TDAP  12/30/2026   Pneumonia Vaccine 6540Years old  Completed   INFLUENZA VACCINE  Completed   Hepatitis C Screening  Completed   Zoster Vaccines- Shingrix  Completed   HPV VACCINES  Aged Out    Health Maintenance  Health Maintenance Due  Topic Date Due   Medicare Annual Wellness (AWV)  07/17/2022    Colorectal cancer screening: No longer required.   Mammogram Status:  Up to date, does yearly mammograms due to breast cancer history   Lung Cancer Screening: (Low Dose CT Chest recommended  if Age 41-80 years, 34 pack-year currently smoking OR have quit w/in 15years.) does qualify.   Additional Screening:  Hepatitis C Screening: does not qualify  Vision Screening: Recommended annual ophthalmology exams for early detection of glaucoma and other disorders of the eye. Is the patient up to date with their annual eye exam?  No  Who is the provider or what is the name of the office in which the patient attends annual eye exams? [facility] If pt is not established with a provider, would they like to be referred to a provider to establish care?  N/A .   Dental Screening: Recommended annual dental exams for proper oral hygiene  Community Resource Referral / Chronic Care Management: CRR required this visit?  No   CCM required this visit?  No      Plan:     I have personally reviewed and noted the following in the patient's chart:   Medical and social history Use of alcohol, tobacco or illicit drugs   Current medications and supplements including opioid prescriptions. Patient is not currently taking opioid prescriptions. Functional ability and status Nutritional status Physical activity Advanced directives List of other physicians Hospitalizations, surgeries, and ER visits in previous 12 months Vitals Screenings to include cognitive, depression, and falls Referrals and appointments  In addition, I have reviewed and discussed with patient certain preventive protocols, quality metrics, and best practice recommendations. A written personalized care plan for preventive services as well as general preventive health recommendations were provided to patient.     Felicity Coyer LPN    95/28/4132

## 2022-07-12 ENCOUNTER — Other Ambulatory Visit: Payer: Self-pay | Admitting: Nurse Practitioner

## 2022-07-28 DIAGNOSIS — D509 Iron deficiency anemia, unspecified: Secondary | ICD-10-CM | POA: Diagnosis not present

## 2022-07-28 DIAGNOSIS — K648 Other hemorrhoids: Secondary | ICD-10-CM | POA: Diagnosis not present

## 2022-07-28 DIAGNOSIS — K5730 Diverticulosis of large intestine without perforation or abscess without bleeding: Secondary | ICD-10-CM | POA: Diagnosis not present

## 2022-07-28 DIAGNOSIS — K2970 Gastritis, unspecified, without bleeding: Secondary | ICD-10-CM | POA: Diagnosis not present

## 2022-07-28 DIAGNOSIS — Z09 Encounter for follow-up examination after completed treatment for conditions other than malignant neoplasm: Secondary | ICD-10-CM | POA: Diagnosis not present

## 2022-07-28 DIAGNOSIS — K635 Polyp of colon: Secondary | ICD-10-CM | POA: Diagnosis not present

## 2022-07-28 DIAGNOSIS — K2930 Chronic superficial gastritis without bleeding: Secondary | ICD-10-CM | POA: Diagnosis not present

## 2022-07-28 DIAGNOSIS — Z86010 Personal history of colonic polyps: Secondary | ICD-10-CM | POA: Diagnosis not present

## 2022-07-28 DIAGNOSIS — K449 Diaphragmatic hernia without obstruction or gangrene: Secondary | ICD-10-CM | POA: Diagnosis not present

## 2022-07-28 DIAGNOSIS — R1310 Dysphagia, unspecified: Secondary | ICD-10-CM | POA: Diagnosis not present

## 2022-07-28 DIAGNOSIS — K222 Esophageal obstruction: Secondary | ICD-10-CM | POA: Diagnosis not present

## 2022-07-29 ENCOUNTER — Ambulatory Visit (INDEPENDENT_AMBULATORY_CARE_PROVIDER_SITE_OTHER): Payer: Medicare HMO | Admitting: *Deleted

## 2022-07-29 DIAGNOSIS — E538 Deficiency of other specified B group vitamins: Secondary | ICD-10-CM

## 2022-07-29 NOTE — Progress Notes (Signed)
Vitamin b12 injection given and patient tolerated well.  

## 2022-08-02 DIAGNOSIS — K2930 Chronic superficial gastritis without bleeding: Secondary | ICD-10-CM | POA: Diagnosis not present

## 2022-08-02 DIAGNOSIS — K635 Polyp of colon: Secondary | ICD-10-CM | POA: Diagnosis not present

## 2022-08-30 ENCOUNTER — Ambulatory Visit (INDEPENDENT_AMBULATORY_CARE_PROVIDER_SITE_OTHER): Payer: Medicare HMO | Admitting: *Deleted

## 2022-08-30 DIAGNOSIS — E538 Deficiency of other specified B group vitamins: Secondary | ICD-10-CM

## 2022-09-01 DIAGNOSIS — K219 Gastro-esophageal reflux disease without esophagitis: Secondary | ICD-10-CM | POA: Diagnosis not present

## 2022-09-01 DIAGNOSIS — K222 Esophageal obstruction: Secondary | ICD-10-CM | POA: Diagnosis not present

## 2022-09-01 DIAGNOSIS — D508 Other iron deficiency anemias: Secondary | ICD-10-CM | POA: Diagnosis not present

## 2022-10-03 ENCOUNTER — Ambulatory Visit (INDEPENDENT_AMBULATORY_CARE_PROVIDER_SITE_OTHER): Payer: Medicare HMO | Admitting: *Deleted

## 2022-10-03 DIAGNOSIS — E538 Deficiency of other specified B group vitamins: Secondary | ICD-10-CM | POA: Diagnosis not present

## 2022-10-03 NOTE — Progress Notes (Signed)
Vitamin b12 injection given in left deltoid and patient tolerated well.

## 2022-10-10 ENCOUNTER — Encounter: Payer: Self-pay | Admitting: Nurse Practitioner

## 2022-10-10 ENCOUNTER — Ambulatory Visit (INDEPENDENT_AMBULATORY_CARE_PROVIDER_SITE_OTHER): Payer: Medicare HMO | Admitting: Nurse Practitioner

## 2022-10-10 VITALS — BP 119/75 | HR 80 | Temp 96.8°F | Resp 20 | Ht 64.0 in | Wt 163.0 lb

## 2022-10-10 DIAGNOSIS — E785 Hyperlipidemia, unspecified: Secondary | ICD-10-CM

## 2022-10-10 DIAGNOSIS — F3342 Major depressive disorder, recurrent, in full remission: Secondary | ICD-10-CM

## 2022-10-10 DIAGNOSIS — Z6831 Body mass index (BMI) 31.0-31.9, adult: Secondary | ICD-10-CM | POA: Diagnosis not present

## 2022-10-10 DIAGNOSIS — M80079D Age-related osteoporosis with current pathological fracture, unspecified ankle and foot, subsequent encounter for fracture with routine healing: Secondary | ICD-10-CM | POA: Diagnosis not present

## 2022-10-10 DIAGNOSIS — G629 Polyneuropathy, unspecified: Secondary | ICD-10-CM

## 2022-10-10 DIAGNOSIS — F5101 Primary insomnia: Secondary | ICD-10-CM

## 2022-10-10 DIAGNOSIS — E039 Hypothyroidism, unspecified: Secondary | ICD-10-CM

## 2022-10-10 DIAGNOSIS — R0602 Shortness of breath: Secondary | ICD-10-CM

## 2022-10-10 DIAGNOSIS — G2581 Restless legs syndrome: Secondary | ICD-10-CM | POA: Diagnosis not present

## 2022-10-10 DIAGNOSIS — R69 Illness, unspecified: Secondary | ICD-10-CM | POA: Diagnosis not present

## 2022-10-10 DIAGNOSIS — D649 Anemia, unspecified: Secondary | ICD-10-CM

## 2022-10-10 DIAGNOSIS — F411 Generalized anxiety disorder: Secondary | ICD-10-CM

## 2022-10-10 DIAGNOSIS — Z79891 Long term (current) use of opiate analgesic: Secondary | ICD-10-CM | POA: Diagnosis not present

## 2022-10-10 LAB — LIPID PANEL

## 2022-10-10 MED ORDER — ROPINIROLE HCL 0.25 MG PO TABS: 0.2500 mg | ORAL_TABLET | Freq: Three times a day (TID) | ORAL | 1 refills | Status: DC

## 2022-10-10 MED ORDER — SERTRALINE HCL 50 MG PO TABS: 50.0000 mg | ORAL_TABLET | Freq: Every day | ORAL | 1 refills | Status: DC

## 2022-10-10 MED ORDER — ALPRAZOLAM 0.25 MG PO TABS: 0.2500 mg | ORAL_TABLET | Freq: Two times a day (BID) | ORAL | 5 refills | Status: DC | PRN

## 2022-10-10 MED ORDER — GABAPENTIN 100 MG PO CAPS: ORAL_CAPSULE | ORAL | 1 refills | Status: DC

## 2022-10-10 MED ORDER — LEVOTHYROXINE SODIUM 75 MCG PO TABS: 75.0000 ug | ORAL_TABLET | Freq: Every day | ORAL | 1 refills | Status: DC

## 2022-10-10 MED ORDER — EZETIMIBE 10 MG PO TABS: 10.0000 mg | ORAL_TABLET | Freq: Every day | ORAL | 1 refills | Status: DC

## 2022-10-10 MED ORDER — TRAZODONE HCL 50 MG PO TABS: ORAL_TABLET | ORAL | 1 refills | Status: DC

## 2022-10-10 NOTE — Progress Notes (Signed)
Subjective:    Patient ID: Phyllis Walker, female    DOB: 12-13-1945, 77 y.o.   MRN: [id number]   Chief Complaint: medical management of chronic issues     HPI:  Phyllis Walker is a 77 y.o. who identifies as a female who was assigned female at birth.   Social history: Lives with: husband Work history: retired   Scientist, forensic in today for follow up of the following chronic medical issues:  1. Hyperlipidemia with target LDL less than 100 Die snot really watch diet and does no dedicated execise Lab Results  Component Value Date   CHOL 214 (H) 04/08/2022   HDL 52 04/08/2022   LDLCALC 143 (H) 04/08/2022   TRIG 108 04/08/2022   CHOLHDL 4.1 04/08/2022     2. Acquired hypothyroidism O problems that Phyllis Walker is aware of Lab Results  Component Value Date   TSH 1.610 04/08/2022     3. Neuropathy Mainly in her feet. Bothers her daily. Neurotin helps.  4. Anemia, unspecified type No c/o fatigue Lab Results  Component Value Date   HGB 10.8 (L) 04/08/2022     5. Recurrent major depressive disorder, in full remission [facility]) Patient has been on zoloft for several years. Is doing well.  6. GAD (generalized anxiety disorder) Takes xanax 2x a day and has for many years. Her husband had brain surgery in NOvember for brain tumor.    10/10/2022    7:59 AM 06/27/2022    2:33 PM 04/08/2022    8:31 AM 01/03/2022    2:28 PM  GAD 7 : Generalized Anxiety Score  Nervous, Anxious, on Edge 2 0 0 1  Control/stop worrying 2 0 0 0  Worry too much - different things 2 0 0 0  Trouble relaxing 1 0 0 0  Restless 1 0 0 0  Easily annoyed or irritable 1 0 0 0  Afraid - awful might happen 1 0 0 0  Total GAD 7 Score 10 0 0 1  Anxiety Difficulty Somewhat difficult Not difficult at all Not difficult at all       7. Primary insomnia Is on trazadone to sleep. Sleeps about 7-8 hours a night  8. Osteoporosis with pathological fracture of ankle and foot, unspecified laterality, with routine  healing, subsequent encounter Last dexascan was done years ago and patient does not want to repeat.  9. BMI 31.0-31.9,adult No recent weight changes  Wt Readings from Last 3 Encounters:  10/10/22 163 lb (73.9 kg)  06/27/22 165 lb (74.8 kg)  04/08/22 164 lb (74.4 kg)   BMI Readings from Last 3 Encounters:  10/10/22 27.98 kg/m  06/27/22 28.32 kg/m  04/08/22 28.15 kg/m     New complaints: Has sob if walks to the mailbox  Allergies  Allergen Reactions   Amoxicillin-Pot Clavulanate Nausea And Vomiting and Other (See Comments)    STOMACH CRAMPS  Has patient had a PCN reaction causing immediate rash, facial/tongue/throat swelling, SOB or lightheadedness with hypotension: No Has patient had a PCN reaction causing severe rash involving mucus membranes or skin necrosis: No Has patient had a PCN reaction that required hospitalization No Has patient had a PCN reaction occurring within the last 10 years: Yes If all of the above answers are "NO", then may proceed with Cephalosporin use.    Outpatient Encounter Medications as of 10/10/2022  Medication Sig   ALPRAZolam (XANAX) 0.25 MG tablet Take 1 tablet (0.25 mg total) by mouth 2 (two) times daily as needed for anxiety.  diclofenac (VOLTAREN) 75 MG EC tablet TAKE ONE TABLET TWICE DAILY   ezetimibe (ZETIA) 10 MG tablet Take 1 tablet (10 mg total) by mouth daily.   gabapentin (NEURONTIN) 100 MG capsule TAKE ONE CAPSULE THREE TIMES DAILY   letrozole (FEMARA) 2.5 MG tablet Take 1 tablet (2.5 mg total) by mouth daily. (Patient not taking: Reported on 07/07/2022)   levothyroxine (SYNTHROID) 75 MCG tablet Take 1 tablet (75 mcg total) by mouth daily.   rOPINIRole (REQUIP) 0.25 MG tablet Take 1 tablet (0.25 mg total) by mouth 3 (three) times daily.   sertraline (ZOLOFT) 50 MG tablet Take 1 tablet (50 mg total) by mouth daily.   traZODone (DESYREL) 50 MG tablet TAKE ONE-HALF TO 1 TABLET AT BEDTIME AS NEEDED FOR SLEEP   Facility-Administered  Encounter Medications as of 10/10/2022  Medication   cyanocobalamin ((VITAMIN B-12)) injection 1,000 mcg   methylPREDNISolone acetate (DEPO-MEDROL) injection 80 mg    Past Surgical History:  Procedure Laterality Date   BREAST LUMPECTOMY  1987   benign fibroid tumor   BREAST LUMPECTOMY WITH RADIOACTIVE SEED AND SENTINEL LYMPH NODE BIOPSY Right 07/05/2018   Procedure: RIGHT BREAST LUMPECTOMY WITH RADIOACTIVE SEED X 2 AND RIGHT AXILLARY SENTINEL LYMPH NODE BIOPSY;  Surgeon: Alphonsa Overall, MD;  Location: [place];  Service: General;  Laterality: Right;   BUNIONECTOMY Bilateral    CATARACT EXTRACTION     COLONOSCOPY     THYROIDECTOMY, PARTIAL  1969   TONSILLECTOMY     TOTAL KNEE ARTHROPLASTY Right 08/15/2016   Procedure: TOTAL KNEE ARTHROPLASTY;  Surgeon: Vickey Huger, MD;  Location: [place];  Service: Orthopedics;  Laterality: Right;  RNFA    Family History  Problem Relation Age of Onset   Cancer Mother        pancreas   Heart attack Father    Hypertension Sister    Hyperlipidemia Sister    Anuerysm Sister    Hypertension Sister    Hyperlipidemia Sister    Early death Sister 58       accident   Ovarian cancer Maternal Aunt       Controlled substance contract: n/a     Review of Systems  Constitutional:  Negative for diaphoresis.  Eyes:  Negative for pain.  Respiratory:  Negative for shortness of breath.   Cardiovascular:  Negative for chest pain, palpitations and leg swelling.  Gastrointestinal:  Negative for abdominal pain.  Endocrine: Negative for polydipsia.  Skin:  Negative for rash.  Neurological:  Negative for dizziness, weakness and headaches.  Hematological:  Does not bruise/bleed easily.  All other systems reviewed and are negative.      Objective:   Physical Exam Vitals and nursing note reviewed.  Constitutional:      General: Phyllis Walker is not in acute distress.    Appearance: Normal appearance. Phyllis Walker is well-developed.  HENT:     Head:  Normocephalic.     Right Ear: Tympanic membrane normal.     Left Ear: Tympanic membrane normal.     Nose: Nose normal.     Mouth/Throat:     Mouth: Mucous membranes are moist.  Eyes:     Pupils: Pupils are equal, round, and reactive to light.  Neck:     Vascular: No carotid bruit or JVD.  Cardiovascular:     Rate and Rhythm: Normal rate and regular rhythm.     Heart sounds: Normal heart sounds.  Pulmonary:     Effort: Pulmonary effort is normal. No respiratory distress.  Breath sounds: Normal breath sounds. No wheezing or rales.  Chest:     Chest wall: No tenderness.  Abdominal:     General: Bowel sounds are normal. There is no distension or abdominal bruit.     Palpations: Abdomen is soft. There is no hepatomegaly, splenomegaly, mass or pulsatile mass.     Tenderness: There is no abdominal tenderness.  Musculoskeletal:        General: Normal range of motion.     Cervical back: Normal range of motion and neck supple.  Lymphadenopathy:     Cervical: No cervical adenopathy.  Skin:    General: Skin is warm and dry.  Neurological:     Mental Status: Phyllis Walker is alert and oriented to person, place, and time.     Deep Tendon Reflexes: Reflexes are normal and symmetric.  Psychiatric:        Behavior: Behavior normal.        Thought Content: Thought content normal.        Judgment: Judgment normal.    BP 119/75   Pulse 80   Temp (!) 96.8 F (36 C) (Temporal)   Resp 20   Ht '5\' 4"'$  (1.626 m)   Wt 163 lb (73.9 kg)   SpO2 93%   BMI 27.98 kg/m         Assessment & Plan:  Shandrea Lusk comes in today with chief complaint of Medical Management of Chronic Issues (Wants albuterol)   Diagnosis and orders addressed:  1. Hyperlipidemia with target LDL less than 100 Low fat diet - ezetimibe (ZETIA) 10 MG tablet; Take 1 tablet (10 mg total) by mouth daily.  Dispense: 90 tablet; Refill: 1 - CBC with Differential/Platelet - CMP14+EGFR - Lipid panel  2. Acquired  hypothyroidism Labs pending - levothyroxine (SYNTHROID) 75 MCG tablet; Take 1 tablet (75 mcg total) by mouth daily.  Dispense: 90 tablet; Refill: 1 - Thyroid Panel With TSH  3. Neuropathy Do not go barefooted - gabapentin (NEURONTIN) 100 MG capsule; TAKE ONE CAPSULE THREE TIMES DAILY  Dispense: 270 capsule; Refill: 1  4. Anemia, unspecified type Labs pendig  5. Recurrent major depressive disorder, in full remission ([facility]) Stress management - sertraline (ZOLOFT) 50 MG tablet; Take 1 tablet (50 mg total) by mouth daily.  Dispense: 90 tablet; Refill: 1  6. GAD (generalized anxiety disorder) - ToxASSURE Select 13 (MW), Urine - ALPRAZolam (XANAX) 0.25 MG tablet; Take 1 tablet (0.25 mg total) by mouth 2 (two) times daily as needed for anxiety.  Dispense: 60 tablet; Refill: 5  7. Primary insomnia Bedtime routine - traZODone (DESYREL) 50 MG tablet; TAKE ONE-HALF TO 1 TABLET AT BEDTIME AS NEEDED FOR SLEEP  Dispense: 90 tablet; Refill: 1  8. Osteoporosis with pathological fracture of ankle and foot, unspecified laterality, with routine healing, subsequent encounter Weight bearing exercises  9. BMI 31.0-31.9,adult Discussed diet and exercise for person with BMI >25 Will recheck weight in 3-6 months   10. Restless leg syndrome Keep legs warm at night - rOPINIRole (REQUIP) 0.25 MG tablet; Take 1 tablet (0.25 mg total) by mouth 3 (three) times daily.  Dispense: 270 tablet; Refill: 1  11. SOB Referral to cardiology  Labs pending Health Maintenance reviewed Diet and exercise encouraged  Follow up plan: 3 months   Mary-Margaret Hassell Done, FNP

## 2022-10-10 NOTE — Patient Instructions (Signed)

## 2022-10-11 LAB — LIPID PANEL
Chol/HDL Ratio: 5.6 ratio — ABNORMAL HIGH (ref 0.0–4.4)
Cholesterol, Total: 223 mg/dL — ABNORMAL HIGH (ref 100–199)
HDL: 40 mg/dL (ref >39)
LDL Chol Calc (NIH): 158 mg/dL — ABNORMAL HIGH (ref 0–99)
Triglycerides: 135 mg/dL (ref 0–149)
VLDL Cholesterol Cal: 25 mg/dL (ref 5–40)

## 2022-10-11 LAB — CMP14+EGFR
ALT: 12 IU/L (ref 0–32)
AST: 19 IU/L (ref 0–40)
Albumin/Globulin Ratio: 1.8 (ref 1.2–2.2)
Albumin: 4.1 g/dL (ref 3.8–4.8)
Alkaline Phosphatase: 118 IU/L (ref 44–121)
BUN/Creatinine Ratio: 19 (ref 12–28)
BUN: 20 mg/dL (ref 8–27)
Bilirubin Total: 0.5 mg/dL (ref 0.0–1.2)
CO2: 23 mmol/L (ref 20–29)
Calcium: 8.8 mg/dL (ref 8.7–10.3)
Chloride: 102 mmol/L (ref 96–106)
Creatinine, Ser: 1.07 mg/dL — ABNORMAL HIGH (ref 0.57–1.00)
Globulin, Total: 2.3 g/dL (ref 1.5–4.5)
Glucose: 87 mg/dL (ref 70–99)
Potassium: 3.6 mmol/L (ref 3.5–5.2)
Sodium: 143 mmol/L (ref 134–144)
Total Protein: 6.4 g/dL (ref 6.0–8.5)
eGFR: 53 mL/min/{1.73_m2} — ABNORMAL LOW (ref >59)

## 2022-10-11 LAB — CBC WITH DIFFERENTIAL/PLATELET
Basophils Absolute: 0.1 10*3/uL (ref 0.0–0.2)
Basos: 1 %
EOS (ABSOLUTE): 0.4 10*3/uL (ref 0.0–0.4)
Eos: 8 %
Hematocrit: 35.2 % (ref 34.0–46.6)
Hemoglobin: 11.9 g/dL (ref 11.1–15.9)
Immature Grans (Abs): 0 10*3/uL (ref 0.0–0.1)
Immature Granulocytes: 0 %
Lymphocytes Absolute: 1 10*3/uL (ref 0.7–3.1)
Lymphs: 19 %
MCH: 30.1 pg (ref 26.6–33.0)
MCHC: 33.8 g/dL (ref 31.5–35.7)
MCV: 89 fL (ref 79–97)
Monocytes Absolute: 0.3 10*3/uL (ref 0.1–0.9)
Monocytes: 6 %
Neutrophils Absolute: 3.5 10*3/uL (ref 1.4–7.0)
Neutrophils: 66 %
Platelets: 188 10*3/uL (ref 150–450)
RBC: 3.96 x10E6/uL (ref 3.77–5.28)
RDW: 12.5 % (ref 11.7–15.4)
WBC: 5.3 10*3/uL (ref 3.4–10.8)

## 2022-10-11 LAB — THYROID PANEL WITH TSH
Free Thyroxine Index: 2.6 (ref 1.2–4.9)
T3 Uptake Ratio: 29 % (ref 24–39)
T4, Total: 8.9 ug/dL (ref 4.5–12.0)
TSH: 1.67 u[IU]/mL (ref 0.450–4.500)

## 2022-10-12 ENCOUNTER — Other Ambulatory Visit: Payer: Self-pay | Admitting: Nurse Practitioner

## 2022-10-12 LAB — TOXASSURE SELECT 13 (MW), URINE

## 2022-10-25 ENCOUNTER — Ambulatory Visit: Payer: Medicare HMO | Attending: Cardiology | Admitting: Cardiology

## 2022-10-25 ENCOUNTER — Ambulatory Visit (HOSPITAL_BASED_OUTPATIENT_CLINIC_OR_DEPARTMENT_OTHER)
Admission: RE | Admit: 2022-10-25 | Discharge: 2022-10-25 | Disposition: A | Discharge status: Home / Self Care | Payer: Medicare HMO | Source: Ambulatory Visit | Attending: Cardiology | Admitting: Cardiology

## 2022-10-25 ENCOUNTER — Encounter: Payer: Self-pay | Admitting: Cardiology

## 2022-10-25 VITALS — BP 130/78 | HR 81 | Ht 64.0 in | Wt 158.4 lb

## 2022-10-25 DIAGNOSIS — J069 Acute upper respiratory infection, unspecified: Secondary | ICD-10-CM | POA: Diagnosis not present

## 2022-10-25 DIAGNOSIS — R0602 Shortness of breath: Secondary | ICD-10-CM | POA: Diagnosis not present

## 2022-10-25 DIAGNOSIS — R059 Cough, unspecified: Secondary | ICD-10-CM | POA: Diagnosis not present

## 2022-10-25 MED ORDER — ALBUTEROL SULFATE HFA 108 (90 BASE) MCG/ACT IN AERS: 2.0000 | INHALATION_SPRAY | Freq: Four times a day (QID) | RESPIRATORY_TRACT | 2 refills | Status: DC | PRN

## 2022-10-25 MED ORDER — PREDNISONE 10 MG (21) PO TBPK: ORAL_TABLET | ORAL | 0 refills | Status: DC

## 2022-10-25 NOTE — Patient Instructions (Addendum)
Medication Instructions:    You can  use Albuterol inhaler as needed  Nasonex  inhaler as needed  Mucinex DM  over the counter   Take prednisone -Pak as directed  - this has been sent to the pharamcy.   *If you need a refill on your cardiac medications before your next appointment, please call your pharmacy*   Lab Work: Not needed    Testing/Procedures:    Please go to the [facility] in [place] A chest x-ray takes a picture of the organs and structures inside the chest, including the heart, lungs, and blood vessels. This test can show several things, including, whether the heart is enlarges; whether fluid is building up in the lungs; and whether pacemaker / defibrillator leads are still in place.   Follow-Up: At [facility], you and your health needs are our priority.  As part of our continuing mission to provide you with exceptional heart care, we have created designated Provider Care Teams.  These Care Teams include your primary Cardiologist (physician) and Advanced Practice Providers (APPs -  Physician Assistants and Nurse Practitioners) who all work together to provide you with the care you need, when you need it.     Your next appointment:   2 month(s)  The format for your next appointment:   Virtual Visit   Provider:   Glenetta Hew, MD    Other Instructions   Follow up with your primary  in 2 weeks

## 2022-10-25 NOTE — Progress Notes (Signed)
Primary Care Provider: Chevis Pretty, [place] Cardiologist: Glenetta Hew, MD Electrophysiologist: None  Clinic Note: Chief Complaint  Patient presents with   New Patient (Initial Visit)    Referred for shortness of breath    ===================================  ASSESSMENT/PLAN   Problem List Items Addressed This Visit     Shortness of breath    This is the referring complaint, and she indicates that this is really only associated with her active URI type symptoms.  For now we will treat her URI and reassess in 2 months. Check 2D x-ray to exclude pneumonia or bronchitis Refill albuterol inhaler Prednisone Dosepak      Recurrent upper respiratory tract infection - Primary    Ongoing coughing and wheezing with somewhat productive cough but no fevers or chills.  Where she did develop fevers or chills I think she may benefit from antibiotics, but for now reasonable to refill her albuterol, Flonase and steroid taper.  Would like to reevaluate her once this is cleared up as it seems like her symptoms are mostly associated with her ongoing illness, and not necessarily cardiac in nature.      Relevant Orders   EKG 12-Lead (Completed)   DG Chest 2 View (Completed)   ===================================  HPI:    Phyllis Walker is an obese 77 y.o. female for HLD, Hypothyroidism, Neuropathy, Depression, and vitamin B12 deficiency with insomnia is being seen today for the evaluation of SOB at the request of Chevis Pretty, *FNP  Alvis Lemmings Delprado was just seen by Chevis Pretty, FNP on January 29 for routine medical management of hypertension, hypothyroidism, neuropathy, anemia, depression/anxiety and insomnia.  Review of symptoms was negative for shortness of breath, chest pain, palpitations and leg swelling, however in the assessment and plan, SOB: Refer to cardiology noted..  Recent Hospitalizations: None  Reviewed  CV studies:     The following studies were reviewed today: (if available, images/films reviewed: From Epic Chart or Care Everywhere) None:  Interval History:   Phyllis Walker presents here today for cardiology evaluation.  She does note that her father had an MI at age 63 and died his complications.  Older sister had a thoracic aortic aneurysm.  She herself was not exactly sure of the reason for her being here.  She says that when she saw her PCP she was dealing with a cold, and just finished a prednisone taper from back in December.  She noted some shortness of breath as well cough.  But no real fevers and chills.  She has some sinus congestion, but only noted some chest comfort with coughing.  She had been visiting her husband in the hospital traveling back and forth in and out of the hospital and got sick herself.  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 She said for about 2 weeks she was coughing quite a bit and coughing up phlegm.  While this was going on she was noting some dyspnea, but has since started feeling better and is no longer noticing any real symptoms of chest pain or dyspnea to the same extent.  She has been using her husband's albuterol and an old fluticasone prescription from a couple years ago which has helped quite a bit.  Prior to having a cold she was feeling fine, is now already starting to feel better.  CV Review of Symptoms (Summary): positive  for - chest pain, shortness of breath, and associated with URI symptoms-cough and congestion/sinus and chest negative for - edema, irregular heartbeat, orthopnea, palpitations, paroxysmal nocturnal dyspnea, rapid heart rate, or lightheadedness, dizziness or wooziness except for with vigorous coughing, syncope/near syncope or TIA/amaurosis fugax, claudication  REVIEWED OF SYSTEMS   Review of Systems  Constitutional:  Positive for  chills and malaise/fatigue (Worn out from URI symptoms and all the x-ray added due to use of being cared for for her husband). Negative for fever.  HENT:  Positive for congestion, sinus pain and sore throat. Negative for nosebleeds.   Respiratory:  Positive for cough, sputum production (She coughed up green phlegm for about 2 with's.  Finally started to clear up a little bit.), shortness of breath (Associated with cough) and wheezing.   Gastrointestinal:  Negative for blood in stool and melena.  Genitourinary:  Negative for hematuria.  Musculoskeletal:  Positive for joint pain and myalgias (Associated with URI symptoms.).  Neurological:  Positive for dizziness (With coughing spells.). Negative for focal weakness and weakness.  Psychiatric/Behavioral:  Positive for depression (Seems to be pretty well-controlled). Negative for memory loss. The patient is nervous/anxious (Relatively well-controlled) and has insomnia (Controlled).     I have reviewed and (if needed) personally updated the patient's problem list, medications, allergies, past medical and surgical history, social and family history.   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    PAST SURGICAL HISTORY   Past Surgical History:  Procedure Laterality Date   BREAST LUMPECTOMY  1987   benign fibroid tumor   BREAST LUMPECTOMY WITH RADIOACTIVE SEED AND SENTINEL LYMPH NODE BIOPSY Right 07/05/2018   Procedure: RIGHT BREAST LUMPECTOMY WITH RADIOACTIVE SEED X 2 AND RIGHT AXILLARY SENTINEL LYMPH NODE BIOPSY;  Surgeon: Alphonsa Overall, MD;  Location: [facility];  Service: General;   Laterality: Right;   BUNIONECTOMY Bilateral    CATARACT EXTRACTION     COLONOSCOPY     THYROIDECTOMY, PARTIAL  1969   TONSILLECTOMY     TOTAL KNEE ARTHROPLASTY Right 08/15/2016   Procedure: TOTAL KNEE ARTHROPLASTY;  Surgeon: Vickey Huger, MD;  Location: [facility];  Service: Orthopedics;  Laterality: Right;  RNFA    MEDICATIONS/ALLERGIES   Current Meds  Medication Sig   ALPRAZolam (XANAX) 0.25 MG tablet Take 1 tablet (0.25 mg total) by mouth 2 (two) times daily as needed for anxiety.   benazepril-hydrochlorthiazide (LOTENSIN HCT) 20-12.5 MG tablet 1/2 tablet Orally Once a day   diclofenac (VOLTAREN) 75 MG EC tablet TAKE ONE TABLET TWICE DAILY   ezetimibe (ZETIA) 10 MG tablet Take 1 tablet (10 mg total) by mouth daily.   Ferrous Sulfate (IRON) 325 (65 Fe) MG TABS 1 tablet Orally once a day for 30 day(s)   gabapentin (NEURONTIN) 100 MG capsule TAKE ONE CAPSULE THREE TIMES DAILY   letrozole (FEMARA) 2.5 MG tablet Take 1 tablet (2.5 mg total) by mouth daily.   levothyroxine (SYNTHROID) 75 MCG tablet Take 1 tablet (75 mcg total) by mouth daily.  pantoprazole (PROTONIX) 20 MG tablet 1 tablet Orally Once a day 30 minutes before breakfast for 90 days   pregabalin (LYRICA) 50 MG capsule 1 capsule Orally Twice a day   rOPINIRole (REQUIP) 0.25 MG tablet Take 1 tablet (0.25 mg total) by mouth 3 (three) times daily.   sertraline (ZOLOFT) 50 MG tablet Take 1 tablet (50 mg total) by mouth daily.   simvastatin (ZOCOR) 20 MG tablet 1 tablet every evening Orally Once a day for 30 day(s)   traZODone (DESYREL) 50 MG tablet TAKE ONE-HALF TO 1 TABLET AT BEDTIME AS NEEDED FOR SLEEP    Allergies  Allergen Reactions   Amoxicillin-Pot Clavulanate Nausea And Vomiting and Other (See Comments)    STOMACH CRAMPS  Has patient had a PCN reaction causing immediate rash, facial/tongue/throat swelling, SOB or lightheadedness with hypotension: No Has patient had a PCN reaction causing severe rash involving mucus membranes  or skin necrosis: No Has patient had a PCN reaction that required hospitalization No Has patient had a PCN reaction occurring within the last 10 years: Yes If all of the above answers are "NO", then may proceed with Cephalosporin use.     SOCIAL HISTORY/FAMILY HISTORY   Reviewed in Epic:   Social History   Tobacco Use   Smoking status: Former    Packs/day: 2.00    Years: 40.00    Total pack years: 80.00    Types: Cigarettes    Quit date: 12/21/2006    Years since quitting: 15.8   Smokeless tobacco: Never  Vaping Use   Vaping Use: Never used  Substance Use Topics   Alcohol use: No   Drug use: No   Social History   Social History Narrative   Lives with her husband has one cat   One child in [place] and one in [place]   Family History  Problem Relation Age of Onset   Cancer Mother        pancreas   Heart attack Father    Hypertension Sister    Hyperlipidemia Sister    Anuerysm Sister    Hypertension Sister    Hyperlipidemia Sister    Early death Sister 5       accident   Ovarian cancer Maternal Aunt     OBJCTIVE -PE, EKG, labs   Wt Readings from Last 3 Encounters:  10/25/22 158 lb 6.4 oz (71.8 kg)  10/10/22 163 lb (73.9 kg)  06/27/22 165 lb (74.8 kg)    Physical Exam: BP 130/78   Pulse 81   Ht 5' 4"$  (1.626 m)   Wt 158 lb 6.4 oz (71.8 kg)   SpO2 90%   BMI 27.19 kg/m  Physical Exam Vitals reviewed.  Constitutional:      Appearance: She is normal weight. She is ill-appearing (She seems somewhat tired and worn out.). She is not toxic-appearing.     Comments: Well-nourished and well-groomed.  HENT:     Head: Normocephalic and atraumatic.  Neck:     Vascular: No carotid bruit.  Cardiovascular:     Rate and Rhythm: Normal rate and regular rhythm.     Pulses: Normal pulses.     Heart sounds: Normal heart sounds. No murmur heard.    No friction rub. No gallop.  Pulmonary:     Effort: Pulmonary effort is normal.     Breath sounds: Wheezing and rhonchi  (Mild bibasal) present. No rales.  Chest:     Chest wall: Tenderness present.  Musculoskeletal:  General: Normal range of motion.     Cervical back: Normal range of motion and neck supple.  Skin:    General: Skin is warm and dry.  Neurological:     General: No focal deficit present.     Mental Status: She is alert and oriented to person, place, and time.  Psychiatric:        Mood and Affect: Mood normal.        Behavior: Behavior normal.        Thought Content: Thought content normal.        Judgment: Judgment normal.     Comments: Flat affect     Adult ECG Report  Rate: 81 ;  Rhythm: normal sinus rhythm and normal axis, intervals and durations. ;   Narrative Interpretation: Normal  Recent Labs: Reviewed Lab Results  Component Value Date   CHOL 223 (H) 10/10/2022   HDL 40 10/10/2022   LDLCALC 158 (H) 10/10/2022   TRIG 135 10/10/2022   CHOLHDL 5.6 (H) 10/10/2022   Lab Results  Component Value Date   CREATININE 1.07 (H) 10/10/2022   BUN 20 10/10/2022   NA 143 10/10/2022   K 3.6 10/10/2022   CL 102 10/10/2022   CO2 23 10/10/2022      Latest Ref Rng & Units 10/10/2022    8:34 AM 04/08/2022   10:02 AM 10/11/2021    8:57 AM  CBC  WBC 3.4 - 10.8 x10E3/uL 5.3  5.8  6.3   Hemoglobin 11.1 - 15.9 g/dL 11.9  10.8  12.7   Hematocrit 34.0 - 46.6 % 35.2  33.5  39.3   Platelets 150 - 450 x10E3/uL 188  227  212     Lab Results  Component Value Date   HGBA1C 5.0 10/07/2020   Lab Results  Component Value Date   TSH 1.670 10/10/2022    ================================================== I spent a total of 28 minutes with the patient spent in direct patient consultation.  Additional time spent with chart review  / charting (studies, outside notes, etc): 21 min Total Time: 49 min  Current medicines are reviewed at length with the patient today.  (+/- concerns) N/A  Notice: This dictation was prepared with Dragon dictation along with smart phrase technology. Any  transcriptional errors that result from this process are unintentional and may not be corrected upon review.   Studies Ordered:  Orders Placed This Encounter  Procedures   DG Chest 2 View   EKG 12-Lead   Meds ordered this encounter  Medications   albuterol (VENTOLIN HFA) 108 (90 Base) MCG/ACT inhaler    Sig: Inhale 2 puffs into the lungs every 6 (six) hours as needed for wheezing or shortness of breath.    Dispense:  8 g    Refill:  2   predniSONE (STERAPRED UNI-PAK 21 TAB) 10 MG (21) TBPK tablet    Sig: Follow direction on taper pak    Dispense:  21 tablet    Refill:  0    Patient Instructions / Medication Changes & Studies & Tests Ordered   Patient Instructions  Medication Instructions:    You can  use Albuterol inhaler as needed  Nasonex  inhaler as needed  Mucinex DM  over the counter   Take prednisone -Pak as directed  - this has been sent to the pharamcy.   *If you need a refill on your cardiac medications before your next appointment, please call your pharmacy*   Lab Work: Not needed  Testing/Procedures:    Please go to the [facility] at [facility] in [place] A chest x-ray takes a picture of the organs and structures inside the chest, including the heart, lungs, and blood vessels. This test can show several things, including, whether the heart is enlarges; whether fluid is building up in the lungs; and whether pacemaker / defibrillator leads are still in place.   Follow-Up: At [facility], you and your health needs are our priority.  As part of our continuing mission to provide you with exceptional heart care, we have created designated Provider Care Teams.  These Care Teams include your primary Cardiologist (physician) and Advanced Practice Providers (APPs -  Physician Assistants and Nurse Practitioners) who all work together to provide you with the care you need, when you need it.     Your next appointment:   2 month(s)  The format for your next  appointment:   Virtual Visit   Provider:   Glenetta Hew, MD    Other Instructions   Follow up with your primary  in 2 weeks     Leonie Man, MD, MS Glenetta Hew, M.D., M.S. Interventional Cardiologist  [facility]  Pager # [phone] Phone # [phone] [address] [place], [place] [address]   Thank you for choosing [facility]!!

## 2022-10-28 ENCOUNTER — Telehealth (INDEPENDENT_AMBULATORY_CARE_PROVIDER_SITE_OTHER): Payer: Medicare HMO | Admitting: Nurse Practitioner

## 2022-10-28 ENCOUNTER — Encounter: Payer: Self-pay | Admitting: Nurse Practitioner

## 2022-10-28 DIAGNOSIS — J40 Bronchitis, not specified as acute or chronic: Secondary | ICD-10-CM

## 2022-10-28 MED ORDER — AZITHROMYCIN 250 MG PO TABS: ORAL_TABLET | ORAL | 0 refills | Status: DC

## 2022-10-28 MED ORDER — BENZONATATE 100 MG PO CAPS: 100.0000 mg | ORAL_CAPSULE | Freq: Three times a day (TID) | ORAL | 0 refills | Status: DC | PRN

## 2022-10-28 NOTE — Progress Notes (Signed)
Virtual Visit Consent   Phyllis Walker, you are scheduled for a virtual visit with Mary-Margaret Hassell Done, FNP, a [facility] provider, today.     Just as with appointments in the office, your consent must be obtained to participate.  Your consent will be active for this visit and any virtual visit you may have with one of our providers in the next 365 days.     If you have a MyChart account, a copy of this consent can be sent to you electronically.  All virtual visits are billed to your insurance company just like a traditional visit in the office.    As this is a virtual visit, video technology does not allow for your provider to perform a traditional examination.  This may limit your provider's ability to fully assess your condition.  If your provider identifies any concerns that need to be evaluated in person or the need to arrange testing (such as labs, EKG, etc.), we will make arrangements to do so.     Although advances in technology are sophisticated, we cannot ensure that it will always work on either your end or our end.  If the connection with a video visit is poor, the visit may have to be switched to a telephone visit.  With either a video or telephone visit, we are not always able to ensure that we have a secure connection.     I need to obtain your verbal consent now.   Are you willing to proceed with your visit today? YES   Phyllis Walker has provided verbal consent on 10/28/2022 for a virtual visit (video or telephone).   Mary-Margaret Hassell Done, FNP   Date: 10/28/2022 12:46 PM   Virtual Visit via Video Note   I, Mary-Margaret Hassell Done, connected with Phyllis Walker ([id number], [birth year]) on 10/28/22 at 12:20 PM EST by a video-enabled telemedicine application and verified that I am speaking with the correct person using two identifiers.  Location: Patient: Virtual Visit Location Patient: Home Provider: Virtual Visit Location Provider: Mobile   I  discussed the limitations of evaluation and management by telemedicine and the availability of in person appointments. The patient expressed understanding and agreed to proceed.    History of Present Illness: Phyllis Walker is a 77 y.o. who identifies as a female who was assigned female at birth, and is being seen today for cough.  HPI: Cough This is a new problem. The current episode started in the past 7 days. The problem has been waxing and waning. The problem occurs every few minutes. The cough is Productive of sputum. Associated symptoms include rhinorrhea. Pertinent negatives include no ear congestion, ear pain or fever. Nothing aggravates the symptoms. Treatments tried: steroids were given to her by cardiology. The treatment provided mild relief.   * she has been running back and forth to the hospital with her husband to get radiation treatments  Patient has multiple chronic medical problems listed below Review of Systems  Constitutional:  Negative for fever.  HENT:  Positive for rhinorrhea. Negative for ear pain.   Respiratory:  Positive for cough.      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 05/22/2014   Hypothyroidism 05/12/2014   Hyperlipidemia with target LDL less than 100 05/12/2014   Restless leg syndrome 02/21/2014   Neuropathy 02/21/2014   Unilateral primary osteoarthritis, right knee 05/23/2013   GAD (generalized anxiety disorder) 12/21/2012   Depression 12/21/2012    Allergies:  Allergies  Allergen Reactions    Amoxicillin-Pot Clavulanate Nausea And Vomiting and Other (See Comments)    STOMACH CRAMPS  Has patient had a PCN reaction causing immediate rash, facial/tongue/throat swelling, SOB or lightheadedness with hypotension: No Has patient had a PCN reaction causing severe rash involving mucus membranes or skin necrosis: No Has patient had a PCN reaction that required hospitalization No Has patient had a PCN reaction occurring within the last 10 years: Yes If all of the above answers are "NO", then may proceed with Cephalosporin use.    Medications:  Current Outpatient Medications:    albuterol (VENTOLIN HFA) 108 (90 Base) MCG/ACT inhaler, Inhale 2 puffs into the lungs every 6 (six) hours as needed for wheezing or shortness of breath., Disp: 8 g, Rfl: 2   ALPRAZolam (XANAX) 0.25 MG tablet, Take 1 tablet (0.25 mg total) by mouth 2 (two) times daily as needed for anxiety., Disp: 60 tablet, Rfl: 5   benazepril-hydrochlorthiazide (LOTENSIN HCT) 20-12.5 MG tablet, 1/2 tablet Orally Once a day, Disp: , Rfl:    diclofenac (VOLTAREN) 75 MG EC tablet, TAKE ONE TABLET TWICE DAILY, Disp: 90 tablet, Rfl: 1   ezetimibe (ZETIA) 10 MG tablet, Take 1 tablet (10 mg total) by mouth daily., Disp: 90 tablet, Rfl: 1   Ferrous Sulfate (IRON) 325 (65 Fe) MG TABS, 1 tablet Orally once a day for 30 day(s), Disp: , Rfl:    gabapentin (NEURONTIN) 100 MG capsule, TAKE ONE CAPSULE THREE TIMES DAILY, Disp: 270 capsule, Rfl: 1   letrozole (FEMARA) 2.5 MG tablet, Take 1 tablet (2.5 mg total) by mouth daily., Disp: 90 tablet, Rfl: 1   levothyroxine (SYNTHROID) 75 MCG tablet, Take 1 tablet (75 mcg total) by mouth daily., Disp: 90 tablet, Rfl: 1   pantoprazole (PROTONIX) 20 MG tablet, 1 tablet Orally Once a day 30 minutes before breakfast for 90 days, Disp: , Rfl:    predniSONE (STERAPRED UNI-PAK 21 TAB) 10 MG (21) TBPK tablet, Follow direction on taper pak, Disp: 21 tablet, Rfl: 0   pregabalin (LYRICA) 50 MG capsule, 1 capsule  Orally Twice a day, Disp: , Rfl:    rOPINIRole (REQUIP) 0.25 MG tablet, Take 1 tablet (0.25 mg total) by mouth 3 (three) times daily., Disp: 270 tablet, Rfl: 1   sertraline (ZOLOFT) 50 MG tablet, Take 1 tablet (50 mg total) by mouth daily., Disp: 90 tablet, Rfl: 1   simvastatin (ZOCOR) 20 MG tablet, 1 tablet every evening Orally Once a day for 30 day(s), Disp: , Rfl:    traZODone (DESYREL) 50 MG tablet, TAKE ONE-HALF TO 1 TABLET AT BEDTIME AS NEEDED FOR SLEEP, Disp: 90 tablet, Rfl: 1  Current Facility-Administered Medications:    cyanocobalamin ((VITAMIN B-12)) injection 1,000 mcg, 1,000 mcg, Intramuscular, Q30 days, Hassell Done, Mary-Margaret, FNP, 1,000 mcg at 10/03/22 0854   methylPREDNISolone acetate (DEPO-MEDROL) injection 80 mg, 80 mg, Intramuscular, Once, Hassell Done, Mary-Margaret, FNP  Observations/Objective: Patient is well-developed, well-nourished in no acute distress.  Resting comfortably  at home.  Head is normocephalic, atraumatic.  No labored breathing.  Speech is clear and coherent with logical content.  Patient is alert  and oriented at baseline.  Deep cough  Assessment and Plan:  Phyllis Walker in today with chief complaint of Cough   1. Bronchitis 1. Take meds as prescribed 2. Use a cool mist humidifier especially during the winter months and when heat has been humid. 3. Use saline nose sprays frequently 4. Saline irrigations of the nose can be very helpful if done frequently.  * 4X daily for 1 week*  * Use of a nettie pot can be helpful with this. Follow directions with this* 5. Drink plenty of fluids 6. Keep thermostat turn down low 7.For any cough or congestion- tessalon perles 8. For fever or aces or pains- take tylenol or ibuprofen appropriate for age and weight.  * for fevers greater than 101 orally you may alternate ibuprofen and tylenol every  3 hours.    Meds ordered this encounter  Medications   azithromycin (ZITHROMAX Z-PAK) 250 MG tablet    Sig: As  directed    Dispense:  6 tablet    Refill:  0    Order Specific Question:   Supervising Provider    Answer:   Caryl Pina A [id number]   benzonatate (TESSALON PERLES) 100 MG capsule    Sig: Take 1 capsule (100 mg total) by mouth 3 (three) times daily as needed for cough.    Dispense:  20 capsule    Refill:  0    Order Specific Question:   Supervising Provider    Answer:   Caryl Pina A [id number]      Follow Up Instructions: I discussed the assessment and treatment plan with the patient. The patient was provided an opportunity to ask questions and all were answered. The patient agreed with the plan and demonstrated an understanding of the instructions.  A copy of instructions were sent to the patient via MyChart.  The patient was advised to call back or seek an in-person evaluation if the symptoms worsen or if the condition fails to improve as anticipated.  Time:  I spent 7 minutes with the patient via telehealth technology discussing the above problems/concerns.    Mary-Margaret Hassell Done, FNP

## 2022-10-28 NOTE — Patient Instructions (Signed)
Phyllis Walker, thank you for joining Chevis Pretty, FNP for today's virtual visit.  While this provider is not your primary care provider (PCP), if your PCP is located in our provider database this encounter information will be shared with them immediately following your visit.   [facility] account gives you access to today's visit and all your visits, tests, and labs performed at [facility] " click here if you don't have a [facility] account or go to [email or web address]  Consent: (Patient) Phyllis Walker provided verbal consent for this virtual visit at the beginning of the encounter.  Current Medications:  Current Outpatient Medications:    azithromycin (ZITHROMAX Z-PAK) 250 MG tablet, As directed, Disp: 6 tablet, Rfl: 0   benzonatate (TESSALON PERLES) 100 MG capsule, Take 1 capsule (100 mg total) by mouth 3 (three) times daily as needed for cough., Disp: 20 capsule, Rfl: 0   albuterol (VENTOLIN HFA) 108 (90 Base) MCG/ACT inhaler, Inhale 2 puffs into the lungs every 6 (six) hours as needed for wheezing or shortness of breath., Disp: 8 g, Rfl: 2   ALPRAZolam (XANAX) 0.25 MG tablet, Take 1 tablet (0.25 mg total) by mouth 2 (two) times daily as needed for anxiety., Disp: 60 tablet, Rfl: 5   benazepril-hydrochlorthiazide (LOTENSIN HCT) 20-12.5 MG tablet, 1/2 tablet Orally Once a day, Disp: , Rfl:    diclofenac (VOLTAREN) 75 MG EC tablet, TAKE ONE TABLET TWICE DAILY, Disp: 90 tablet, Rfl: 1   ezetimibe (ZETIA) 10 MG tablet, Take 1 tablet (10 mg total) by mouth daily., Disp: 90 tablet, Rfl: 1   Ferrous Sulfate (IRON) 325 (65 Fe) MG TABS, 1 tablet Orally once a day for 30 day(s), Disp: , Rfl:    gabapentin (NEURONTIN) 100 MG capsule, TAKE ONE CAPSULE THREE TIMES DAILY, Disp: 270 capsule, Rfl: 1   letrozole (FEMARA) 2.5 MG tablet, Take 1 tablet (2.5 mg total) by mouth daily., Disp: 90 tablet, Rfl: 1   levothyroxine (SYNTHROID) 75 MCG  tablet, Take 1 tablet (75 mcg total) by mouth daily., Disp: 90 tablet, Rfl: 1   pantoprazole (PROTONIX) 20 MG tablet, 1 tablet Orally Once a day 30 minutes before breakfast for 90 days, Disp: , Rfl:    predniSONE (STERAPRED UNI-PAK 21 TAB) 10 MG (21) TBPK tablet, Follow direction on taper pak, Disp: 21 tablet, Rfl: 0   pregabalin (LYRICA) 50 MG capsule, 1 capsule Orally Twice a day, Disp: , Rfl:    rOPINIRole (REQUIP) 0.25 MG tablet, Take 1 tablet (0.25 mg total) by mouth 3 (three) times daily., Disp: 270 tablet, Rfl: 1   sertraline (ZOLOFT) 50 MG tablet, Take 1 tablet (50 mg total) by mouth daily., Disp: 90 tablet, Rfl: 1   simvastatin (ZOCOR) 20 MG tablet, 1 tablet every evening Orally Once a day for 30 day(s), Disp: , Rfl:    traZODone (DESYREL) 50 MG tablet, TAKE ONE-HALF TO 1 TABLET AT BEDTIME AS NEEDED FOR SLEEP, Disp: 90 tablet, Rfl: 1  Current Facility-Administered Medications:    cyanocobalamin ((VITAMIN B-12)) injection 1,000 mcg, 1,000 mcg, Intramuscular, Q30 days, Hassell Done, Mary-Margaret, FNP, 1,000 mcg at 10/03/22 0854   methylPREDNISolone acetate (DEPO-MEDROL) injection 80 mg, 80 mg, Intramuscular, Once, Hassell Done, Mary-Margaret, FNP   Medications ordered in this encounter:  Meds ordered this encounter  Medications   azithromycin (ZITHROMAX Z-PAK) 250 MG tablet    Sig: As directed    Dispense:  6 tablet    Refill:  0    Order Specific Question:  Supervising Provider    Answer:   Caryl Pina A [[id number]]   benzonatate (TESSALON PERLES) 100 MG capsule    Sig: Take 1 capsule (100 mg total) by mouth 3 (three) times daily as needed for cough.    Dispense:  20 capsule    Refill:  0    Order Specific Question:   Supervising Provider    Answer:   Caryl Pina A [id number]     *If you need refills on other medications prior to your next appointment, please contact your pharmacy*  Follow-Up: Call back or seek an in-person evaluation if the symptoms worsen or if the condition  fails to improve as anticipated.  [place]  Other Instructions 1. Take meds as prescribed 2. Use a cool mist humidifier especially during the winter months and when heat has been humid. 3. Use saline nose sprays frequently 4. Saline irrigations of the nose can be very helpful if done frequently.  * 4X daily for 1 week*  * Use of a nettie pot can be helpful with this. Follow directions with this* 5. Drink plenty of fluids 6. Keep thermostat turn down low 7.For any cough or congestion- tessalon perles 8. For fever or aces or pains- take tylenol or ibuprofen appropriate for age and weight.  * for fevers greater than 101 orally you may alternate ibuprofen and tylenol every  3 hours.      If you have been instructed to have an in-person evaluation today at a local Urgent Care facility, please use the link below. It will take you to a list of all of our available [facility], including address, phone number and hours of operation. Please do not delay care.  [facility]  If you or a family member do not have a primary care provider, use the link below to schedule a visit and establish care. When you choose a [facility] primary care physician or advanced practice provider, you gain a long-term partner in health. Find a Primary Care Provider  Learn more about [facility]'s in-office and virtual care options: [facility]

## 2022-11-03 ENCOUNTER — Encounter: Payer: Self-pay | Admitting: Cardiology

## 2022-11-03 ENCOUNTER — Ambulatory Visit (INDEPENDENT_AMBULATORY_CARE_PROVIDER_SITE_OTHER): Payer: Medicare HMO | Admitting: *Deleted

## 2022-11-03 DIAGNOSIS — R0602 Shortness of breath: Secondary | ICD-10-CM | POA: Insufficient documentation

## 2022-11-03 DIAGNOSIS — E538 Deficiency of other specified B group vitamins: Secondary | ICD-10-CM | POA: Diagnosis not present

## 2022-11-03 NOTE — Assessment & Plan Note (Signed)
Ongoing coughing and wheezing with somewhat productive cough but no fevers or chills.  Where she did develop fevers or chills I think she may benefit from antibiotics, but for now reasonable to refill her albuterol, Flonase and steroid taper.  Would like to reevaluate her once this is cleared up as it seems like her symptoms are mostly associated with her ongoing illness, and not necessarily cardiac in nature.

## 2022-11-03 NOTE — Progress Notes (Signed)
Pt given B12 injection IM left deltoid and tolerated well. °

## 2022-11-03 NOTE — Assessment & Plan Note (Addendum)
This is the referring complaint, and she indicates that this is really only associated with her active URI type symptoms.  For now we will treat her URI and reassess in 2 months. Check 2D x-ray to exclude pneumonia or bronchitis Refill albuterol inhaler Prednisone Dosepak

## 2022-11-15 ENCOUNTER — Ambulatory Visit (INDEPENDENT_AMBULATORY_CARE_PROVIDER_SITE_OTHER): Payer: Medicare HMO | Admitting: Family Medicine

## 2022-11-15 ENCOUNTER — Encounter: Payer: Self-pay | Admitting: Family Medicine

## 2022-11-15 VITALS — BP 113/67 | HR 107 | Temp 95.8°F | Ht 64.0 in | Wt 153.2 lb

## 2022-11-15 DIAGNOSIS — R5383 Other fatigue: Secondary | ICD-10-CM

## 2022-11-15 DIAGNOSIS — R509 Fever, unspecified: Secondary | ICD-10-CM

## 2022-11-15 DIAGNOSIS — R5381 Other malaise: Secondary | ICD-10-CM | POA: Diagnosis not present

## 2022-11-15 DIAGNOSIS — J189 Pneumonia, unspecified organism: Secondary | ICD-10-CM | POA: Diagnosis not present

## 2022-11-15 DIAGNOSIS — J069 Acute upper respiratory infection, unspecified: Secondary | ICD-10-CM | POA: Diagnosis not present

## 2022-11-15 MED ORDER — LEVOFLOXACIN 750 MG PO TABS: 750.0000 mg | ORAL_TABLET | Freq: Every day | ORAL | 0 refills | Status: AC

## 2022-11-15 MED ORDER — ALBUTEROL SULFATE HFA 108 (90 BASE) MCG/ACT IN AERS: 2.0000 | INHALATION_SPRAY | Freq: Four times a day (QID) | RESPIRATORY_TRACT | 2 refills | Status: DC | PRN

## 2022-11-15 MED ORDER — PREDNISONE 20 MG PO TABS: 40.0000 mg | ORAL_TABLET | Freq: Every day | ORAL | 0 refills | Status: AC

## 2022-11-15 MED ORDER — PROMETHAZINE-DM 6.25-15 MG/5ML PO SYRP: 2.5000 mL | ORAL_SOLUTION | Freq: Four times a day (QID) | ORAL | 0 refills | Status: DC | PRN

## 2022-11-15 NOTE — Progress Notes (Signed)
Subjective:  Patient ID: Phyllis Walker, female    DOB: 1945-12-19, 77 y.o.   MRN: [id number]  Patient Care Team: Chevis Pretty, FNP as PCP - General (Family Medicine) Leonie Man, MD as PCP - Cardiology (Cardiology) Vickey Huger, MD as Consulting Physician (Orthopedic Surgery) Huel Cote, NP (Inactive) as Nurse Practitioner (Obstetrics and Gynecology) Nicholas Lose, MD as Consulting Physician (Hematology and Oncology) Alphonsa Overall, MD as Consulting Physician (General Surgery) Kyung Rudd, MD as Consulting Physician (Radiation Oncology) Lavera Guise, [facility] as Pharmacist (Family Medicine)   Chief Complaint:  Shortness of Breath, Nasal Congestion, and Cough (Patient was seen on 2/16 and states she is no better )   HPI: Phyllis Walker is a 77 y.o. female presenting on 11/15/2022 for Shortness of Breath, Nasal Congestion, and Cough (Patient was seen on 2/16 and states she is no better )   Pt presents today for continue URI with cough and congestion. Was treated on 10/28/2022 with Z-Pack and tessalon. She states she has not improved, continues to get worse.   Shortness of Breath This is a new problem. The current episode started 1 to 4 weeks ago. The problem has been waxing and waning. Associated symptoms include a fever, sputum production and wheezing. Pertinent negatives include no abdominal pain, chest pain, claudication, coryza, ear pain, headaches, hemoptysis, leg pain, leg swelling, neck pain, orthopnea, PND, rash, rhinorrhea, sore throat, swollen glands, syncope or vomiting. The symptoms are aggravated by any activity. The patient has no known risk factors for DVT/PE. She has tried beta agonist inhalers, oral steroids, prescription cough suppressants and OTC cough suppressants for the symptoms. The treatment provided mild relief.  Cough The current episode started 1 to 4 weeks ago. The problem has been waxing and waning. The problem occurs every few  minutes. The cough is Productive of purulent sputum. Associated symptoms include a fever, nasal congestion, shortness of breath and wheezing. Pertinent negatives include no chest pain, chills, ear congestion, ear pain, headaches, heartburn, hemoptysis, myalgias, postnasal drip, rash, rhinorrhea, sore throat, sweats or weight loss. She has tried a beta-agonist inhaler, oral steroids, OTC cough suppressant and prescription cough suppressant for the symptoms. The treatment provided mild relief.    Relevant past medical, surgical, family, and social history reviewed and updated as indicated.  Allergies and medications reviewed and updated. Data reviewed: Chart in Epic.   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    Past Surgical History:  Procedure Laterality Date   BREAST LUMPECTOMY  1987   benign fibroid tumor   BREAST LUMPECTOMY WITH RADIOACTIVE SEED AND SENTINEL LYMPH NODE BIOPSY Right 07/05/2018   Procedure: RIGHT BREAST LUMPECTOMY WITH RADIOACTIVE SEED X 2 AND RIGHT AXILLARY SENTINEL LYMPH NODE BIOPSY;  Surgeon: Alphonsa Overall, MD;  Location: [facility];  Service: General;  Laterality: Right;   BUNIONECTOMY Bilateral    CATARACT EXTRACTION     COLONOSCOPY     THYROIDECTOMY, PARTIAL  1969   TONSILLECTOMY     TOTAL KNEE ARTHROPLASTY Right 08/15/2016  Procedure: TOTAL KNEE ARTHROPLASTY;  Surgeon: Vickey Huger, MD;  Location: [facility];  Service: Orthopedics;  Laterality: Right;  RNFA    Social History   Socioeconomic History   Marital status: Married    Spouse name: Not on file   Number of children: 2   Years of education: 12   Highest education level:  High school graduate  Occupational History   Occupation: retired  Tobacco Use   Smoking status: Former    Packs/day: 2.00    Years: 40.00    Total pack years: 80.00    Types: Cigarettes    Quit date: 12/21/2006    Years since quitting: 15.9   Smokeless tobacco: Never  Vaping Use   Vaping Use: Never used  Substance and Sexual Activity   Alcohol use: No   Drug use: No   Sexual activity: Not Currently  Other Topics Concern   Not on file  Social History Narrative   Lives with her husband has one cat   One child in [place] and one in [place] Strain: [place]  (07/07/2022)   Overall Financial Resource Strain (CARDIA)    Difficulty of Paying Living Expenses: Not hard at all  Food Insecurity: No [place] (07/07/2022)   Hunger Vital Sign    Worried About Running Out of Food in the Last Year: Never true    [place] in the Last Year: Never true  Transportation Needs: No Transportation Needs (07/07/2022)   PRAPARE - Hydrologist (Medical): No    Lack of Transportation (Non-Medical): No  Physical Activity: Inactive (07/07/2022)   Exercise Vital Sign    Days of Exercise per Week: 0 days    Minutes of Exercise per Session: 0 min  Stress: No Stress Concern Present (07/07/2022)   [place]    Feeling of Stress : Not at all  Social Connections: [place] (07/07/2022)   Social Connection and Isolation Panel [NHANES]    Frequency of Communication with Friends and Family: More than three times a week    Frequency of Social Gatherings with Friends and Family: More than three times a week    Attends Religious Services: More than 4 times per year    Active Member of [employer] or Organizations: Yes    Attends Music therapist: More than 4 times per year    Marital Status: Married  Human resources officer Violence: Not At  Risk (07/07/2022)   Humiliation, Afraid, Rape, and Kick questionnaire    Fear of Current or Ex-Partner: No    Emotionally Abused: No    Physically Abused: No    Sexually Abused: No    Outpatient Encounter Medications as of 11/15/2022  Medication Sig   ALPRAZolam (XANAX) 0.25 MG tablet Take 1 tablet (0.25 mg total) by mouth 2 (two) times daily as needed for anxiety.   benazepril-hydrochlorthiazide (LOTENSIN HCT) 20-12.5 MG tablet 1/2 tablet Orally Once a day   diclofenac (VOLTAREN) 75 MG EC tablet TAKE ONE TABLET TWICE DAILY   ezetimibe (ZETIA) 10 MG tablet Take 1 tablet (10 mg total) by mouth daily.   Ferrous Sulfate (IRON) 325 (65 Fe) MG TABS 1 tablet Orally once a day for 30 day(s)   gabapentin (NEURONTIN) 100 MG capsule TAKE ONE CAPSULE THREE TIMES DAILY   letrozole (FEMARA) 2.5 MG tablet Take 1 tablet (2.5 mg total) by mouth daily.  levofloxacin (LEVAQUIN) 750 MG tablet Take 1 tablet (750 mg total) by mouth daily for 5 days.   levothyroxine (SYNTHROID) 75 MCG tablet Take 1 tablet (75 mcg total) by mouth daily.   pantoprazole (PROTONIX) 20 MG tablet 1 tablet Orally Once a day 30 minutes before breakfast for 90 days   predniSONE (DELTASONE) 20 MG tablet Take 2 tablets (40 mg total) by mouth daily with breakfast for 5 days. 2 po daily for 5 days   pregabalin (LYRICA) 50 MG capsule 1 capsule Orally Twice a day   promethazine-dextromethorphan (PROMETHAZINE-DM) 6.25-15 MG/5ML syrup Take 2.5-5 mLs by mouth 4 (four) times daily as needed for cough.   rOPINIRole (REQUIP) 0.25 MG tablet Take 1 tablet (0.25 mg total) by mouth 3 (three) times daily.   sertraline (ZOLOFT) 50 MG tablet Take 1 tablet (50 mg total) by mouth daily.   simvastatin (ZOCOR) 20 MG tablet 1 tablet every evening Orally Once a day for 30 day(s)   traZODone (DESYREL) 50 MG tablet TAKE ONE-HALF TO 1 TABLET AT BEDTIME AS NEEDED FOR SLEEP   [DISCONTINUED] albuterol (VENTOLIN HFA) 108 (90 Base) MCG/ACT inhaler Inhale 2 puffs into  the lungs every 6 (six) hours as needed for wheezing or shortness of breath.   albuterol (VENTOLIN HFA) 108 (90 Base) MCG/ACT inhaler Inhale 2 puffs into the lungs every 6 (six) hours as needed for wheezing or shortness of breath.   [DISCONTINUED] azithromycin (ZITHROMAX Z-PAK) 250 MG tablet As directed   [DISCONTINUED] benzonatate (TESSALON PERLES) 100 MG capsule Take 1 capsule (100 mg total) by mouth 3 (three) times daily as needed for cough.   [DISCONTINUED] predniSONE (STERAPRED UNI-PAK 21 TAB) 10 MG (21) TBPK tablet Follow direction on taper pak   Facility-Administered Encounter Medications as of 11/15/2022  Medication   cyanocobalamin ((VITAMIN B-12)) injection 1,000 mcg   methylPREDNISolone acetate (DEPO-MEDROL) injection 80 mg    Allergies  Allergen Reactions   Amoxicillin-Pot Clavulanate Nausea And Vomiting and Other (See Comments)    STOMACH CRAMPS  Has patient had a PCN reaction causing immediate rash, facial/tongue/throat swelling, SOB or lightheadedness with hypotension: No Has patient had a PCN reaction causing severe rash involving mucus membranes or skin necrosis: No Has patient had a PCN reaction that required hospitalization No Has patient had a PCN reaction occurring within the last 10 years: Yes If all of the above answers are "NO", then may proceed with Cephalosporin use.     Review of Systems  Constitutional:  Positive for activity change, appetite change, fatigue and fever. Negative for chills, diaphoresis, unexpected weight change and weight loss.  HENT:  Positive for congestion. Negative for dental problem, drooling, ear discharge, ear pain, facial swelling, hearing loss, mouth sores, nosebleeds, postnasal drip, rhinorrhea, sinus pressure, sinus pain, sneezing, sore throat, tinnitus, trouble swallowing and voice change.   Eyes:  Negative for photophobia and visual disturbance.  Respiratory:  Positive for cough, sputum production, shortness of breath and wheezing.  Negative for apnea, hemoptysis, choking, chest tightness and stridor.   Cardiovascular:  Negative for chest pain, orthopnea, claudication, leg swelling, syncope and PND.  Gastrointestinal:  Negative for abdominal pain, heartburn and vomiting.  Genitourinary:  Negative for decreased urine volume and difficulty urinating.  Musculoskeletal:  Negative for myalgias and neck pain.  Skin:  Negative for rash.  Neurological:  Negative for dizziness, tremors, seizures, syncope, facial asymmetry, speech difficulty, weakness, light-headedness, numbness and headaches.  Psychiatric/Behavioral:  Negative for confusion.   All other systems reviewed and are negative.  Objective:  BP 113/67   Pulse (!) 107   Temp (!) 95.8 F (35.4 C) (Temporal)   Ht '5\' 4"'$  (1.626 m)   Wt 153 lb 3.2 oz (69.5 kg)   SpO2 93%   BMI 26.30 kg/m    Wt Readings from Last 3 Encounters:  11/15/22 153 lb 3.2 oz (69.5 kg)  10/25/22 158 lb 6.4 oz (71.8 kg)  10/10/22 163 lb (73.9 kg)    Physical Exam Vitals and nursing note reviewed.  Constitutional:      General: She is not in acute distress.    Appearance: Normal appearance. She is well-developed. She is ill-appearing. She is not toxic-appearing or diaphoretic.  HENT:     Head: Normocephalic and atraumatic.     Mouth/Throat:     Mouth: Mucous membranes are moist.     Pharynx: Oropharynx is clear.  Eyes:     Extraocular Movements: Extraocular movements intact.     Conjunctiva/sclera: Conjunctivae normal.     Pupils: Pupils are equal, round, and reactive to light.  Cardiovascular:     Rate and Rhythm: Normal rate and regular rhythm.  Pulmonary:     Effort: Pulmonary effort is normal.     Breath sounds: Examination of the right-lower field reveals decreased breath sounds, wheezing and rhonchi. Decreased breath sounds, wheezing and rhonchi present. No rales.  Musculoskeletal:     Cervical back: Normal range of motion and neck supple.     Right lower leg: No  edema.     Left lower leg: No edema.  Skin:    General: Skin is warm and dry.     Capillary Refill: Capillary refill takes less than 2 seconds.  Neurological:     General: No focal deficit present.     Mental Status: She is alert and oriented to person, place, and time.  Psychiatric:        Mood and Affect: Mood normal.        Behavior: Behavior normal.        Thought Content: Thought content normal.        Judgment: Judgment normal.     Results for orders placed or performed in visit on 10/10/22  ToxASSURE Select 13 (MW), Urine  Result Value Ref Range   Summary Note   CBC with Differential/Platelet  Result Value Ref Range   WBC 5.3 3.4 - 10.8 x10E3/uL   RBC 3.96 3.77 - 5.28 x10E6/uL   Hemoglobin 11.9 11.1 - 15.9 g/dL   Hematocrit 35.2 34.0 - 46.6 %   MCV 89 79 - 97 fL   MCH 30.1 26.6 - 33.0 pg   MCHC 33.8 31.5 - 35.7 g/dL   RDW 12.5 11.7 - 15.4 %   Platelets 188 150 - 450 x10E3/uL   Neutrophils 66 Not Estab. %   Lymphs 19 Not Estab. %   Monocytes 6 Not Estab. %   Eos 8 Not Estab. %   Basos 1 Not Estab. %   Neutrophils Absolute 3.5 1.4 - 7.0 x10E3/uL   Lymphocytes Absolute 1.0 0.7 - 3.1 x10E3/uL   Monocytes Absolute 0.3 0.1 - 0.9 x10E3/uL   EOS (ABSOLUTE) 0.4 0.0 - 0.4 x10E3/uL   Basophils Absolute 0.1 0.0 - 0.2 x10E3/uL   Immature Granulocytes 0 Not Estab. %   Immature Grans (Abs) 0.0 0.0 - 0.1 x10E3/uL  CMP14+EGFR  Result Value Ref Range   Glucose 87 70 - 99 mg/dL   BUN 20 8 - 27 mg/dL   Creatinine, Ser 1.07 (H)  0.57 - 1.00 mg/dL   eGFR 53 (L) >59 mL/min/1.73   BUN/Creatinine Ratio 19 12 - 28   Sodium 143 134 - 144 mmol/L   Potassium 3.6 3.5 - 5.2 mmol/L   Chloride 102 96 - 106 mmol/L   CO2 23 20 - 29 mmol/L   Calcium 8.8 8.7 - 10.3 mg/dL   Total Protein 6.4 6.0 - 8.5 g/dL   Albumin 4.1 3.8 - 4.8 g/dL   Globulin, Total 2.3 1.5 - 4.5 g/dL   Albumin/Globulin Ratio 1.8 1.2 - 2.2   Bilirubin Total 0.5 0.0 - 1.2 mg/dL   Alkaline Phosphatase 118 44 - 121 IU/L    AST 19 0 - 40 IU/L   ALT 12 0 - 32 IU/L  Lipid panel  Result Value Ref Range   Cholesterol, Total 223 (H) 100 - 199 mg/dL   Triglycerides 135 0 - 149 mg/dL   HDL 40 >39 mg/dL   VLDL Cholesterol Cal 25 5 - 40 mg/dL   LDL Chol Calc (NIH) 158 (H) 0 - 99 mg/dL   Chol/HDL Ratio 5.6 (H) 0.0 - 4.4 ratio  Thyroid Panel With TSH  Result Value Ref Range   TSH 1.670 0.450 - 4.500 uIU/mL   T4, Total 8.9 4.5 - 12.0 ug/dL   T3 Uptake Ratio 29 24 - 39 %   Free Thyroxine Index 2.6 1.2 - 4.9       Pertinent labs & imaging results that were available during my care of the patient were reviewed by me and considered in my medical decision making.  Assessment & Plan:  Phyllis Walker was seen today for shortness of breath, nasal congestion and cough.  Diagnoses and all orders for this visit:  URI with cough and congestion Malaise and fatigue Fever and chills Community acquired pneumonia of right lower lobe of lung Ongoing and worsening with adventitious lung sounds in RLL. No Xray available in office today but s/s and clinical exam consistent with CAP of RLL. Will treat as such with below. Pt aware of red flags which require emergent treatment. Aware to follow up in office in 1-2 weeks for reevaluation.  -     levofloxacin (LEVAQUIN) 750 MG tablet; Take 1 tablet (750 mg total) by mouth daily for 5 days. -     promethazine-dextromethorphan (PROMETHAZINE-DM) 6.25-15 MG/5ML syrup; Take 2.5-5 mLs by mouth 4 (four) times daily as needed for cough. -     albuterol (VENTOLIN HFA) 108 (90 Base) MCG/ACT inhaler; Inhale 2 puffs into the lungs every 6 (six) hours as needed for wheezing or shortness of breath. -     predniSONE (DELTASONE) 20 MG tablet; Take 2 tablets (40 mg total) by mouth daily with breakfast for 5 days. 2 po daily for 5 days     Continue all other maintenance medications.  Follow up plan: Return in about 2 weeks (around 11/29/2022), or if symptoms worsen or fail to improve, for CAP.   Continue  healthy lifestyle choices, including diet (rich in fruits, vegetables, and lean proteins, and low in salt and simple carbohydrates) and exercise (at least 30 minutes of moderate physical activity daily).  Educational handout given for CAP  The above assessment and management plan was discussed with the patient. The patient verbalized understanding of and has agreed to the management plan. Patient is aware to call the clinic if they develop any new symptoms or if symptoms persist or worsen. Patient is aware when to return to the clinic for a follow-up visit. Patient  educated on when it is appropriate to go to the emergency department.   Monia Pouch, FNP-C [facility] [phone]

## 2022-11-29 ENCOUNTER — Encounter: Payer: Self-pay | Admitting: Nurse Practitioner

## 2022-11-29 ENCOUNTER — Ambulatory Visit (INDEPENDENT_AMBULATORY_CARE_PROVIDER_SITE_OTHER): Payer: Medicare HMO | Admitting: Nurse Practitioner

## 2022-11-29 VITALS — BP 124/81 | HR 74 | Temp 98.2°F | Resp 20 | Ht 64.8 in | Wt 152.0 lb

## 2022-11-29 DIAGNOSIS — J189 Pneumonia, unspecified organism: Secondary | ICD-10-CM | POA: Diagnosis not present

## 2022-11-29 NOTE — Progress Notes (Signed)
Subjective:    Patient ID: Phyllis Walker, female    DOB: 1946-02-08, 77 y.o.   MRN: [id number]   Chief Complaint: 2 week recheck   HPI Patient saw M. Rakes on 11/15/22. No xray was available that day and sounded like se may have some pneumonia. She was treated with levaquin  and prednisone. She is much better, but feels weak. Still has runny nose. Patient Active Problem List   Diagnosis Date Noted   Shortness of breath 11/03/2022   Recurrent upper respiratory tract infection 10/25/2022   Malignant neoplasm of upper-inner quadrant of right breast in female, estrogen receptor positive ([place]) 05/31/2018   Chronic right shoulder pain 04/11/2017   S/P total knee replacement 08/15/2016   Other seasonal allergic rhinitis 12/21/2015   BMI 31.0-31.9,adult 06/19/2015   Anemia 05/27/2015   Insomnia 03/09/2015   Osteoporosis with pathological fracture of ankle and foot 05/22/2014   Hypothyroidism 05/12/2014   Hyperlipidemia with target LDL less than 100 05/12/2014   Restless leg syndrome 02/21/2014   Neuropathy 02/21/2014   Unilateral primary osteoarthritis, right knee 05/23/2013   GAD (generalized anxiety disorder) 12/21/2012   Depression 12/21/2012       Review of Systems  Constitutional:  Negative for diaphoresis.  Eyes:  Negative for pain.  Respiratory:  Negative for shortness of breath.   Cardiovascular:  Negative for chest pain, palpitations and leg swelling.  Gastrointestinal:  Negative for abdominal pain.  Endocrine: Negative for polydipsia.  Skin:  Negative for rash.  Neurological:  Negative for dizziness, weakness and headaches.  Hematological:  Does not bruise/bleed easily.  All other systems reviewed and are negative.      Objective:   Physical Exam Vitals and nursing note reviewed.  Constitutional:      General: She is not in acute distress.    Appearance: Normal appearance. She is well-developed.  HENT:     Head: Normocephalic.     Right Ear: Tympanic  membrane normal.     Left Ear: Tympanic membrane normal.     Nose: Nose normal.     Mouth/Throat:     Mouth: Mucous membranes are moist.  Eyes:     Pupils: Pupils are equal, round, and reactive to light.  Neck:     Vascular: No carotid bruit or JVD.  Cardiovascular:     Rate and Rhythm: Normal rate and regular rhythm.     Heart sounds: Normal heart sounds.  Pulmonary:     Effort: Pulmonary effort is normal. No respiratory distress.     Breath sounds: Normal breath sounds. No wheezing or rales.  Chest:     Chest wall: No tenderness.  Abdominal:     General: Bowel sounds are normal. There is no distension or abdominal bruit.     Palpations: Abdomen is soft. There is no hepatomegaly, splenomegaly, mass or pulsatile mass.     Tenderness: There is no abdominal tenderness.  Musculoskeletal:        General: Normal range of motion.     Cervical back: Normal range of motion and neck supple.  Lymphadenopathy:     Cervical: No cervical adenopathy.  Skin:    General: Skin is warm and dry.  Neurological:     Mental Status: She is alert and oriented to person, place, and time.     Deep Tendon Reflexes: Reflexes are normal and symmetric.  Psychiatric:        Behavior: Behavior normal.        Thought Content: Thought  content normal.        Judgment: Judgment normal.    BP 124/81   Pulse 74   Temp 98.2 F (36.8 C) (Temporal)   Resp 20   Ht 5' 4.8" (1.646 m)   Wt 152 lb (68.9 kg)   SpO2 97%   BMI 25.45 kg/m         Assessment & Plan:   Phyllis Walker in today with chief complaint of 2 week recheck   1. Pneumonia due to infectious organism, unspecified laterality, unspecified part of lung Resolved Continue to force fluids Rest  RTO prn    The above assessment and management plan was discussed with the patient. The patient verbalized understanding of and has agreed to the management plan. Patient is aware to call the clinic if symptoms persist or worsen. Patient is  aware when to return to the clinic for a follow-up visit. Patient educated on when it is appropriate to go to the emergency department.   Mary-Margaret Hassell Done, FNP

## 2022-11-29 NOTE — Patient Instructions (Signed)
Our records indicate that you are due for your screening mammogram.  Please call the imaging center that does your yearly mammograms to make an appointment for a mammogram at your earliest convenience. Our office also has a mobile unit through the [facility] that comes to our location. Please call our office if you would like to make an appointment.   

## 2022-12-06 ENCOUNTER — Ambulatory Visit (INDEPENDENT_AMBULATORY_CARE_PROVIDER_SITE_OTHER): Payer: Medicare HMO

## 2022-12-06 DIAGNOSIS — E538 Deficiency of other specified B group vitamins: Secondary | ICD-10-CM

## 2022-12-06 NOTE — Progress Notes (Signed)
Cyanocobalamin injection given to left deltoid.  Patient tolerated well. 

## 2022-12-15 DIAGNOSIS — Z1231 Encounter for screening mammogram for malignant neoplasm of breast: Secondary | ICD-10-CM | POA: Diagnosis not present

## 2022-12-27 ENCOUNTER — Encounter: Payer: Self-pay | Admitting: Nurse Practitioner

## 2023-01-06 ENCOUNTER — Other Ambulatory Visit: Payer: Medicare HMO

## 2023-01-06 ENCOUNTER — Other Ambulatory Visit: Payer: Self-pay

## 2023-01-06 ENCOUNTER — Ambulatory Visit (INDEPENDENT_AMBULATORY_CARE_PROVIDER_SITE_OTHER): Payer: Medicare HMO

## 2023-01-06 DIAGNOSIS — E538 Deficiency of other specified B group vitamins: Secondary | ICD-10-CM

## 2023-01-06 DIAGNOSIS — E785 Hyperlipidemia, unspecified: Secondary | ICD-10-CM

## 2023-01-06 DIAGNOSIS — E039 Hypothyroidism, unspecified: Secondary | ICD-10-CM

## 2023-01-06 NOTE — Progress Notes (Signed)
Cyanocobalamin injection given to left deltoid.  Patient tolerated well. 

## 2023-01-07 LAB — CMP14+EGFR
ALT: 7 IU/L (ref 0–32)
AST: 14 IU/L (ref 0–40)
Albumin/Globulin Ratio: 2 (ref 1.2–2.2)
Albumin: 3.9 g/dL (ref 3.8–4.8)
Alkaline Phosphatase: 110 IU/L (ref 44–121)
BUN/Creatinine Ratio: 15 (ref 12–28)
BUN: 23 mg/dL (ref 8–27)
Bilirubin Total: 0.5 mg/dL (ref 0.0–1.2)
CO2: 22 mmol/L (ref 20–29)
Calcium: 9 mg/dL (ref 8.7–10.3)
Chloride: 104 mmol/L (ref 96–106)
Creatinine, Ser: 1.5 mg/dL — ABNORMAL HIGH (ref 0.57–1.00)
Globulin, Total: 2 g/dL (ref 1.5–4.5)
Glucose: 86 mg/dL (ref 70–99)
Potassium: 4.3 mmol/L (ref 3.5–5.2)
Sodium: 144 mmol/L (ref 134–144)
Total Protein: 5.9 g/dL — ABNORMAL LOW (ref 6.0–8.5)
eGFR: 36 mL/min/{1.73_m2} — ABNORMAL LOW (ref >59)

## 2023-01-07 LAB — CBC WITH DIFFERENTIAL/PLATELET
Basophils Absolute: 0.1 10*3/uL (ref 0.0–0.2)
Basos: 2 %
EOS (ABSOLUTE): 0.4 10*3/uL (ref 0.0–0.4)
Eos: 9 %
Hematocrit: 34.5 % (ref 34.0–46.6)
Hemoglobin: 11.3 g/dL (ref 11.1–15.9)
Immature Grans (Abs): 0 10*3/uL (ref 0.0–0.1)
Immature Granulocytes: 0 %
Lymphocytes Absolute: 1 10*3/uL (ref 0.7–3.1)
Lymphs: 21 %
MCH: 29.7 pg (ref 26.6–33.0)
MCHC: 32.8 g/dL (ref 31.5–35.7)
MCV: 91 fL (ref 79–97)
Monocytes Absolute: 0.3 10*3/uL (ref 0.1–0.9)
Monocytes: 6 %
Neutrophils Absolute: 3 10*3/uL (ref 1.4–7.0)
Neutrophils: 62 %
Platelets: 153 10*3/uL (ref 150–450)
RBC: 3.81 x10E6/uL (ref 3.77–5.28)
RDW: 13.2 % (ref 11.7–15.4)
WBC: 4.8 10*3/uL (ref 3.4–10.8)

## 2023-01-07 LAB — LIPID PANEL
Chol/HDL Ratio: 4.6 ratio — ABNORMAL HIGH (ref 0.0–4.4)
Cholesterol, Total: 205 mg/dL — ABNORMAL HIGH (ref 100–199)
HDL: 45 mg/dL (ref >39)
LDL Chol Calc (NIH): 135 mg/dL — ABNORMAL HIGH (ref 0–99)
Triglycerides: 142 mg/dL (ref 0–149)
VLDL Cholesterol Cal: 25 mg/dL (ref 5–40)

## 2023-01-07 LAB — THYROID PANEL WITH TSH
Free Thyroxine Index: 1.7 (ref 1.2–4.9)
T3 Uptake Ratio: 22 % — ABNORMAL LOW (ref 24–39)
T4, Total: 7.7 ug/dL (ref 4.5–12.0)
TSH: 0.573 u[IU]/mL (ref 0.450–4.500)

## 2023-01-10 ENCOUNTER — Ambulatory Visit: Payer: Medicare HMO | Admitting: Cardiology

## 2023-01-13 ENCOUNTER — Ambulatory Visit (INDEPENDENT_AMBULATORY_CARE_PROVIDER_SITE_OTHER): Payer: Medicare HMO | Admitting: Nurse Practitioner

## 2023-01-13 ENCOUNTER — Encounter: Payer: Self-pay | Admitting: Nurse Practitioner

## 2023-01-13 ENCOUNTER — Other Ambulatory Visit: Payer: Self-pay

## 2023-01-13 VITALS — BP 128/67 | HR 63 | Temp 96.9°F | Resp 20 | Ht 64.0 in | Wt 151.0 lb

## 2023-01-13 DIAGNOSIS — F3342 Major depressive disorder, recurrent, in full remission: Secondary | ICD-10-CM

## 2023-01-13 DIAGNOSIS — I10 Essential (primary) hypertension: Secondary | ICD-10-CM

## 2023-01-13 DIAGNOSIS — E039 Hypothyroidism, unspecified: Secondary | ICD-10-CM

## 2023-01-13 DIAGNOSIS — F411 Generalized anxiety disorder: Secondary | ICD-10-CM | POA: Diagnosis not present

## 2023-01-13 DIAGNOSIS — G2581 Restless legs syndrome: Secondary | ICD-10-CM

## 2023-01-13 DIAGNOSIS — D649 Anemia, unspecified: Secondary | ICD-10-CM

## 2023-01-13 DIAGNOSIS — E785 Hyperlipidemia, unspecified: Secondary | ICD-10-CM | POA: Diagnosis not present

## 2023-01-13 DIAGNOSIS — M80079D Age-related osteoporosis with current pathological fracture, unspecified ankle and foot, subsequent encounter for fracture with routine healing: Secondary | ICD-10-CM | POA: Diagnosis not present

## 2023-01-13 DIAGNOSIS — F5101 Primary insomnia: Secondary | ICD-10-CM

## 2023-01-13 DIAGNOSIS — Z6831 Body mass index (BMI) 31.0-31.9, adult: Secondary | ICD-10-CM | POA: Diagnosis not present

## 2023-01-13 DIAGNOSIS — J189 Pneumonia, unspecified organism: Secondary | ICD-10-CM

## 2023-01-13 DIAGNOSIS — G629 Polyneuropathy, unspecified: Secondary | ICD-10-CM

## 2023-01-13 DIAGNOSIS — J069 Acute upper respiratory infection, unspecified: Secondary | ICD-10-CM

## 2023-01-13 MED ORDER — ALBUTEROL SULFATE HFA 108 (90 BASE) MCG/ACT IN AERS: 2.0000 | INHALATION_SPRAY | Freq: Four times a day (QID) | RESPIRATORY_TRACT | 2 refills | Status: AC | PRN

## 2023-01-13 MED ORDER — ALPRAZOLAM 0.25 MG PO TABS: 0.2500 mg | ORAL_TABLET | Freq: Two times a day (BID) | ORAL | 5 refills | Status: DC | PRN

## 2023-01-13 MED ORDER — IRON 325 (65 FE) MG PO TABS: ORAL_TABLET | ORAL | 1 refills | Status: DC

## 2023-01-13 MED ORDER — EZETIMIBE 10 MG PO TABS: 10.0000 mg | ORAL_TABLET | Freq: Every day | ORAL | 1 refills | Status: DC

## 2023-01-13 MED ORDER — BENAZEPRIL-HYDROCHLOROTHIAZIDE 20-12.5 MG PO TABS: ORAL_TABLET | ORAL | 1 refills | Status: DC

## 2023-01-13 MED ORDER — SIMVASTATIN 20 MG PO TABS: ORAL_TABLET | ORAL | 1 refills | Status: DC

## 2023-01-13 MED ORDER — SERTRALINE HCL 50 MG PO TABS: 50.0000 mg | ORAL_TABLET | Freq: Every day | ORAL | 1 refills | Status: DC

## 2023-01-13 MED ORDER — PANTOPRAZOLE SODIUM 20 MG PO TBEC: DELAYED_RELEASE_TABLET | ORAL | 1 refills | Status: DC

## 2023-01-13 MED ORDER — GABAPENTIN 100 MG PO CAPS: ORAL_CAPSULE | ORAL | 1 refills | Status: DC

## 2023-01-13 MED ORDER — ROPINIROLE HCL 0.25 MG PO TABS: 0.2500 mg | ORAL_TABLET | Freq: Three times a day (TID) | ORAL | 1 refills | Status: DC

## 2023-01-13 MED ORDER — LEVOTHYROXINE SODIUM 75 MCG PO TABS: 75.0000 ug | ORAL_TABLET | Freq: Every day | ORAL | 1 refills | Status: DC

## 2023-01-13 MED ORDER — PREGABALIN 50 MG PO CAPS: ORAL_CAPSULE | ORAL | 1 refills | Status: DC

## 2023-01-13 MED ORDER — TRAZODONE HCL 50 MG PO TABS: ORAL_TABLET | ORAL | 1 refills | Status: DC

## 2023-01-13 NOTE — Patient Instructions (Signed)
Fall Prevention in the Home, Adult Falls can cause injuries and can happen to people of all ages. There are many things you can do to make your home safer and to help prevent falls. What actions can I take to prevent falls? General information Use good lighting in all rooms. Make sure to: Replace any light bulbs that burn out. Turn on the lights in dark areas and use night-lights. Keep items that you use often in easy-to-reach places. Lower the shelves around your home if needed. Move furniture so that there are clear paths around it. Do not use throw rugs or other things on the floor that can make you trip. If any of your floors are uneven, fix them. Add color or contrast paint or tape to clearly mark and help you see: Grab bars or handrails. First and last steps of staircases. Where the edge of each step is. If you use a ladder or stepladder: Make sure that it is fully opened. Do not climb a closed ladder. Make sure the sides of the ladder are locked in place. Have someone hold the ladder while you use it. Know where your pets are as you move through your home. What can I do in the bathroom?     Keep the floor dry. Clean up any water on the floor right away. Remove soap buildup in the bathtub or shower. Buildup makes bathtubs and showers slippery. Use non-skid mats or decals on the floor of the bathtub or shower. Attach bath mats securely with double-sided, non-slip rug tape. If you need to sit down in the shower, use a non-slip stool. Install grab bars by the toilet and in the bathtub and shower. Do not use towel bars as grab bars. What can I do in the bedroom? Make sure that you have a light by your bed that is easy to reach. Do not use any sheets or blankets on your bed that hang to the floor. Have a firm chair or bench with side arms that you can use for support when you get dressed. What can I do in the kitchen? Clean up any spills right away. If you need to reach something  above you, use a step stool with a grab bar. Keep electrical cords out of the way. Do not use floor polish or wax that makes floors slippery. What can I do with my stairs? Do not leave anything on the stairs. Make sure that you have a light switch at the top and the bottom of the stairs. Make sure that there are handrails on both sides of the stairs. Fix handrails that are broken or loose. Install non-slip stair treads on all your stairs if they do not have carpet. Avoid having throw rugs at the top or bottom of the stairs. Choose a carpet that does not hide the edge of the steps on the stairs. Make sure that the carpet is firmly attached to the stairs. Fix carpet that is loose or worn. What can I do on the outside of my home? Use bright outdoor lighting. Fix the edges of walkways and driveways and fix any cracks. Clear paths of anything that can make you trip, such as tools or rocks. Add color or contrast paint or tape to clearly mark and help you see anything that might make you trip as you walk through a door, such as a raised step or threshold. Trim any bushes or trees on paths to your home. Check to see if handrails are loose   or broken and that both sides of all steps have handrails. Install guardrails along the edges of any raised decks and porches. Have leaves, snow, or ice cleared regularly. Use sand, salt, or ice melter on paths if you live where there is ice and snow during the winter. Clean up any spills in your garage right away. This includes grease or oil spills. What other actions can I take? Review your medicines with your doctor. Some medicines can cause dizziness or changes in blood pressure, which increase your risk of falling. Wear shoes that: Have a low heel. Do not wear high heels. Have rubber bottoms and are closed at the toe. Feel good on your feet and fit well. Use tools that help you move around if needed. These include: Canes. Walkers. Scooters. Crutches. Ask  your doctor what else you can do to help prevent falls. This may include seeing a physical therapist to learn to do exercises to move better and get stronger. Where to find more information Centers for Disease Control and Prevention, [employer]: cdc.gov National Institute on Aging: nia.nih.gov National Institute on Aging: nia.nih.gov Contact a doctor if: You are afraid of falling at home. You feel weak, drowsy, or dizzy at home. You fall at home. Get help right away if you: Lose consciousness or have trouble moving after a fall. Have a fall that causes a head injury. These symptoms may be an emergency. Get help right away. Call 911. Do not wait to see if the symptoms will go away. Do not drive yourself to the hospital. This information is not intended to replace advice given to you by your health care provider. Make sure you discuss any questions you have with your health care provider. Document Revised: 05/02/2022 Document Reviewed: 05/02/2022 Elsevier Patient Education  2023 Elsevier Inc.  

## 2023-01-13 NOTE — Progress Notes (Signed)
Subjective:    Patient ID: Phyllis Walker, female    DOB: 1946-09-05, 77 y.o.   MRN: [id number]   Chief Complaint: medical management of chronic issues     HPI:  Phyllis Walker is a 77 y.o. who identifies as a female who was assigned female at birth.   Social history: Lives with: husband Work history: retired   Water engineer in today for follow up of the following chronic medical issues:  1. Acquired hypothyroidism No issues that sheis aware of. Lab Results  Component Value Date   TSH 0.573 01/06/2023     2. Hyperlipidemia with target LDL less than 100 Does try  to watch diet but is currently doing no exercise. Lab Results  Component Value Date   CHOL 205 (H) 01/06/2023   HDL 45 01/06/2023   LDLCALC 135 (H) 01/06/2023   TRIG 142 01/06/2023   CHOLHDL 4.6 (H) 01/06/2023     3. Primary insomnia Is currently taking no sleep aids and is sleeping well.  4. Neuropathy Has neuropathy form breast cancer treatments. Mainly in her feet  5. Osteoporosis with pathological fracture of ankle and foot, unspecified laterality, with routine healing, subsequent encounter Last dexasan was done in 2015. Patient has refused repeat scan.  6. Anemia, unspecified type Does c/o some fatigue. Lab Results  Component Value Date   HGB 11.3 01/06/2023     7. Recurrent major depressive disorder, in full remission (HCC) 8. GAD (generalized anxiety disorder) Patient has been under a lot of stress due to her husbands health. He is undergoig cancer treatments. Her church has been helping  to take him to an from treatments.    01/13/2023    8:31 AM 11/29/2022    1:59 PM 10/10/2022    7:59 AM 06/27/2022    2:33 PM  GAD 7 : Generalized Anxiety Score  Nervous, Anxious, on Edge 0 1 2 0  Control/stop worrying 0 0 2 0  Worry too much - different things 0 1 2 0  Trouble relaxing 0 0 1 0  Restless 0 0 1 0  Easily annoyed or irritable 0 0 1 0  Afraid - awful might happen 0 0 1 0  Total  GAD 7 Score 0 2 10 0  Anxiety Difficulty Not difficult at all  Somewhat difficult Not difficult at all      9. BMI 31.0-31.9,adult No recent weight changes Wt Readings from Last 3 Encounters:  01/13/23 151 lb (68.5 kg)  11/29/22 152 lb (68.9 kg)  11/15/22 153 lb 3.2 oz (69.5 kg)   BMI Readings from Last 3 Encounters:  01/13/23 25.92 kg/m  11/29/22 25.45 kg/m  11/15/22 26.30 kg/m     New complaints: None today  Allergies  Allergen Reactions   Amoxicillin-Pot Clavulanate Nausea And Vomiting and Other (See Comments)    STOMACH CRAMPS  Has patient had a PCN reaction causing immediate rash, facial/tongue/throat swelling, SOB or lightheadedness with hypotension: No Has patient had a PCN reaction causing severe rash involving mucus membranes or skin necrosis: No Has patient had a PCN reaction that required hospitalization No Has patient had a PCN reaction occurring within the last 10 years: Yes If all of the above answers are "NO", then may proceed with Cephalosporin use.    Outpatient Encounter Medications as of 01/13/2023  Medication Sig   albuterol (VENTOLIN HFA) 108 (90 Base) MCG/ACT inhaler Inhale 2 puffs into the lungs every 6 (six) hours as needed for wheezing or shortness of breath.  ALPRAZolam (XANAX) 0.25 MG tablet Take 1 tablet (0.25 mg total) by mouth 2 (two) times daily as needed for anxiety.   benazepril-hydrochlorthiazide (LOTENSIN HCT) 20-12.5 MG tablet 1/2 tablet Orally Once a day   diclofenac (VOLTAREN) 75 MG EC tablet TAKE ONE TABLET TWICE DAILY   ezetimibe (ZETIA) 10 MG tablet Take 1 tablet (10 mg total) by mouth daily.   Ferrous Sulfate (IRON) 325 (65 Fe) MG TABS 1 tablet Orally once a day for 30 day(s)   gabapentin (NEURONTIN) 100 MG capsule TAKE ONE CAPSULE THREE TIMES DAILY   letrozole (FEMARA) 2.5 MG tablet Take 1 tablet (2.5 mg total) by mouth daily.   levothyroxine (SYNTHROID) 75 MCG tablet Take 1 tablet (75 mcg total) by mouth daily.   pantoprazole  (PROTONIX) 20 MG tablet 1 tablet Orally Once a day 30 minutes before breakfast for 90 days   pregabalin (LYRICA) 50 MG capsule 1 capsule Orally Twice a day   promethazine-dextromethorphan (PROMETHAZINE-DM) 6.25-15 MG/5ML syrup Take 2.5-5 mLs by mouth 4 (four) times daily as needed for cough.   rOPINIRole (REQUIP) 0.25 MG tablet Take 1 tablet (0.25 mg total) by mouth 3 (three) times daily.   sertraline (ZOLOFT) 50 MG tablet Take 1 tablet (50 mg total) by mouth daily.   simvastatin (ZOCOR) 20 MG tablet 1 tablet every evening Orally Once a day for 30 day(s)   traZODone (DESYREL) 50 MG tablet TAKE ONE-HALF TO 1 TABLET AT BEDTIME AS NEEDED FOR SLEEP   Facility-Administered Encounter Medications as of 01/13/2023  Medication   cyanocobalamin ((VITAMIN B-12)) injection 1,000 mcg   methylPREDNISolone acetate (DEPO-MEDROL) injection 80 mg    Past Surgical History:  Procedure Laterality Date   BREAST LUMPECTOMY  1987   benign fibroid tumor   BREAST LUMPECTOMY WITH RADIOACTIVE SEED AND SENTINEL LYMPH NODE BIOPSY Right 07/05/2018   Procedure: RIGHT BREAST LUMPECTOMY WITH RADIOACTIVE SEED X 2 AND RIGHT AXILLARY SENTINEL LYMPH NODE BIOPSY;  Surgeon: Ovidio Kin, MD;  Location: [facility];  Service: General;  Laterality: Right;   BUNIONECTOMY Bilateral    CATARACT EXTRACTION     COLONOSCOPY     THYROIDECTOMY, PARTIAL  1969   TONSILLECTOMY     TOTAL KNEE ARTHROPLASTY Right 08/15/2016   Procedure: TOTAL KNEE ARTHROPLASTY;  Surgeon: Dannielle Huh, MD;  Location: [facility] OR;  Service: Orthopedics;  Laterality: Right;  RNFA    Family History  Problem Relation Age of Onset   Cancer Mother        pancreas   Heart attack Father    Hypertension Sister    Hyperlipidemia Sister    Anuerysm Sister    Hypertension Sister    Hyperlipidemia Sister    Early death Sister 5       accident   Ovarian cancer Maternal Aunt       Controlled substance contract: n/a     Review of Systems   Constitutional:  Negative for diaphoresis.  Eyes:  Negative for pain.  Respiratory:  Negative for shortness of breath.   Cardiovascular:  Negative for chest pain, palpitations and leg swelling.  Gastrointestinal:  Negative for abdominal pain.  Endocrine: Negative for polydipsia.  Skin:  Negative for rash.  Neurological:  Negative for dizziness, weakness and headaches.  Hematological:  Does not bruise/bleed easily.  All other systems reviewed and are negative.      Objective:   Physical Exam Vitals and nursing note reviewed.  Constitutional:      General: She is not in acute distress.  Appearance: Normal appearance. She is well-developed.  HENT:     Head: Normocephalic.     Right Ear: Tympanic membrane normal.     Left Ear: Tympanic membrane normal.     Nose: Nose normal.     Mouth/Throat:     Mouth: Mucous membranes are moist.  Eyes:     Pupils: Pupils are equal, round, and reactive to light.  Neck:     Vascular: No carotid bruit or JVD.  Cardiovascular:     Rate and Rhythm: Normal rate and regular rhythm.     Heart sounds: Normal heart sounds.  Pulmonary:     Effort: Pulmonary effort is normal. No respiratory distress.     Breath sounds: Normal breath sounds. No wheezing or rales.  Chest:     Chest wall: No tenderness.  Abdominal:     General: Bowel sounds are normal. There is no distension or abdominal bruit.     Palpations: Abdomen is soft. There is no hepatomegaly, splenomegaly, mass or pulsatile mass.     Tenderness: There is no abdominal tenderness.  Musculoskeletal:        General: Normal range of motion.     Cervical back: Normal range of motion and neck supple.  Lymphadenopathy:     Cervical: No cervical adenopathy.  Skin:    General: Skin is warm and dry.  Neurological:     Mental Status: She is alert and oriented to person, place, and time.     Deep Tendon Reflexes: Reflexes are normal and symmetric.  Psychiatric:        Behavior: Behavior normal.         Thought Content: Thought content normal.        Judgment: Judgment normal.    BP 128/67   Pulse 63   Temp (!) 96.9 F (36.1 C) (Temporal)   Resp 20   Ht 5\' 4"  (1.626 m)   Wt 151 lb (68.5 kg)   SpO2 98%   BMI 25.92 kg/m         Assessment & Plan:   Phyllis Walker comes in today with chief complaint of Medical Management of Chronic Issues   Diagnosis and orders addressed:  1. Acquired hypothyroidism Labs pending - levothyroxine (SYNTHROID) 75 MCG tablet; Take 1 tablet (75 mcg total) by mouth daily.  Dispense: 90 tablet; Refill: 1  2. Hyperlipidemia with target LDL less than 100 Low fat diet - ezetimibe (ZETIA) 10 MG tablet; Take 1 tablet (10 mg total) by mouth daily.  Dispense: 90 tablet; Refill: 1 - simvastatin (ZOCOR) 20 MG tablet; 1 tablet every evening Orally Once a day for 30 day(s)  Dispense: 90 tablet; Refill: 1  3. Primary insomnia bedtime - traZODone (DESYREL) 50 MG tablet; TAKE ONE-HALF TO 1 TABLET AT BEDTIME AS NEEDED FOR SLEEP  Dispense: 90 tablet; Refill: 1  4. Neuropathy Do not go bare footed - gabapentin (NEURONTIN) 100 MG capsule; TAKE ONE CAPSULE THREE TIMES DAILY  Dispense: 270 capsule; Refill: 1 - pregabalin (LYRICA) 50 MG capsule; 1 capsule Orally Twice a day  Dispense: 90 capsule; Refill: 1  5. Osteoporosis with pathological fracture of ankle and foot, unspecified laterality, with routine healing, subsequent encounter Weight bearing exercises  6. Anemia, unspecified type Labs oending - Ferrous Sulfate (IRON) 325 (65 Fe) MG TABS; 1 tablet Orally once a day for 30 day(s)  Dispense: 90 tablet; Refill: 1  7. Recurrent major depressive disorder, in full remission (HCC) Stress manaegment - sertraline (ZOLOFT) 50 MG  tablet; Take 1 tablet (50 mg total) by mouth daily.  Dispense: 90 tablet; Refill: 1  8. GAD (generalized anxiety disorder) - ALPRAZolam (XANAX) 0.25 MG tablet; Take 1 tablet (0.25 mg total) by mouth 2 (two) times daily as  needed for anxiety.  Dispense: 60 tablet; Refill: 5  9. BMI 31.0-31.9,adult Discussed diet and exercise for person with BMI >25 Will recheck weight in 3-6 months  10. Restless leg syndrome Keep legs warm at night - rOPINIRole (REQUIP) 0.25 MG tablet; Take 1 tablet (0.25 mg total) by mouth 3 (three) times daily.  Dispense: 270 tablet; Refill: 1  11. Primary hypertension Dash diet - benazepril-hydrochlorthiazide (LOTENSIN HCT) 20-12.5 MG tablet; 1/2 tablet Orally Once a day  Dispense: 90 tablet; Refill: 1   Labs pending Health Maintenance reviewed Diet and exercise encouraged  Follow up plan: 6 months   Mary-Margaret Daphine Deutscher, FNP

## 2023-01-18 ENCOUNTER — Other Ambulatory Visit: Payer: Self-pay | Admitting: Nurse Practitioner

## 2023-01-23 ENCOUNTER — Telehealth: Payer: Self-pay

## 2023-01-23 NOTE — Telephone Encounter (Signed)
Phyllis Walker (Key: [id number]) PA Case ID #: [id number] Rx #: [id number] Need Help? Call us at [phone] Status sent iconSent to Plan today Drug Pregabalin 50MG  capsules ePA cloud logo Form [employer] Electronic PA Form [phone] NCPDP) Original Claim Info [id number] PRECERT REQ BY MD [phone] REQUIRES PRIOR AUTHORIZATION(PHARMACY HELP DESK [phone]) For RxLocal Coupon Price of: $34.04 submit to BIN: [id number] PCN: CP Group: [facility] --Service provided at no cost and no switch fee to the pharmacy--

## 2023-01-24 ENCOUNTER — Other Ambulatory Visit (HOSPITAL_COMMUNITY): Payer: Self-pay

## 2023-01-24 ENCOUNTER — Other Ambulatory Visit: Payer: Self-pay | Admitting: Nurse Practitioner

## 2023-01-24 ENCOUNTER — Ambulatory Visit (INDEPENDENT_AMBULATORY_CARE_PROVIDER_SITE_OTHER): Payer: Medicare HMO

## 2023-01-24 DIAGNOSIS — M810 Age-related osteoporosis without current pathological fracture: Secondary | ICD-10-CM

## 2023-01-24 DIAGNOSIS — Z780 Asymptomatic menopausal state: Secondary | ICD-10-CM

## 2023-01-24 NOTE — Telephone Encounter (Signed)
Patient Advocate Encounter  Prior Authorization for Pregabalin 50MG  capsules has been approved with [employer].    PA# [id number] Effective dates: 09/12/22 through 09/12/23

## 2023-01-25 MED ORDER — ALENDRONATE SODIUM 70 MG PO TABS: 70.0000 mg | ORAL_TABLET | ORAL | 11 refills | Status: DC

## 2023-02-03 ENCOUNTER — Ambulatory Visit (INDEPENDENT_AMBULATORY_CARE_PROVIDER_SITE_OTHER): Payer: Medicare HMO

## 2023-02-03 DIAGNOSIS — E538 Deficiency of other specified B group vitamins: Secondary | ICD-10-CM | POA: Diagnosis not present

## 2023-02-03 NOTE — Progress Notes (Signed)
Cyanocobalamin injection given to left deltoid.  Patient tolerated well. 

## 2023-03-08 ENCOUNTER — Ambulatory Visit (INDEPENDENT_AMBULATORY_CARE_PROVIDER_SITE_OTHER): Payer: Medicare HMO

## 2023-03-08 DIAGNOSIS — E538 Deficiency of other specified B group vitamins: Secondary | ICD-10-CM

## 2023-03-08 NOTE — Progress Notes (Signed)
B12 injection given in left deltoid - patient tolerated well  

## 2023-04-07 ENCOUNTER — Ambulatory Visit (INDEPENDENT_AMBULATORY_CARE_PROVIDER_SITE_OTHER): Payer: Medicare HMO

## 2023-04-07 DIAGNOSIS — E538 Deficiency of other specified B group vitamins: Secondary | ICD-10-CM

## 2023-04-07 NOTE — Progress Notes (Signed)
Cyanocobalamin injection given to left deltoid.  Patient tolerated well. 

## 2023-04-12 ENCOUNTER — Other Ambulatory Visit: Payer: Self-pay | Admitting: Nurse Practitioner

## 2023-05-08 ENCOUNTER — Ambulatory Visit (INDEPENDENT_AMBULATORY_CARE_PROVIDER_SITE_OTHER): Payer: Medicare HMO

## 2023-05-08 DIAGNOSIS — E538 Deficiency of other specified B group vitamins: Secondary | ICD-10-CM

## 2023-05-08 NOTE — Progress Notes (Signed)
Cyanocobalamin injection given to left deltoid.  Patient tolerated well. 

## 2023-05-24 DIAGNOSIS — G629 Polyneuropathy, unspecified: Secondary | ICD-10-CM | POA: Diagnosis not present

## 2023-05-24 DIAGNOSIS — Z008 Encounter for other general examination: Secondary | ICD-10-CM | POA: Diagnosis not present

## 2023-05-24 DIAGNOSIS — G2581 Restless legs syndrome: Secondary | ICD-10-CM | POA: Diagnosis not present

## 2023-05-24 DIAGNOSIS — I10 Essential (primary) hypertension: Secondary | ICD-10-CM | POA: Diagnosis not present

## 2023-05-24 DIAGNOSIS — I2510 Atherosclerotic heart disease of native coronary artery without angina pectoris: Secondary | ICD-10-CM | POA: Diagnosis not present

## 2023-05-24 DIAGNOSIS — F411 Generalized anxiety disorder: Secondary | ICD-10-CM | POA: Diagnosis not present

## 2023-05-24 DIAGNOSIS — F324 Major depressive disorder, single episode, in partial remission: Secondary | ICD-10-CM | POA: Diagnosis not present

## 2023-05-24 DIAGNOSIS — J309 Allergic rhinitis, unspecified: Secondary | ICD-10-CM | POA: Diagnosis not present

## 2023-05-24 DIAGNOSIS — E039 Hypothyroidism, unspecified: Secondary | ICD-10-CM | POA: Diagnosis not present

## 2023-05-24 DIAGNOSIS — E785 Hyperlipidemia, unspecified: Secondary | ICD-10-CM | POA: Diagnosis not present

## 2023-05-24 DIAGNOSIS — M1990 Unspecified osteoarthritis, unspecified site: Secondary | ICD-10-CM | POA: Diagnosis not present

## 2023-05-24 DIAGNOSIS — R32 Unspecified urinary incontinence: Secondary | ICD-10-CM | POA: Diagnosis not present

## 2023-05-24 DIAGNOSIS — K219 Gastro-esophageal reflux disease without esophagitis: Secondary | ICD-10-CM | POA: Diagnosis not present

## 2023-06-08 ENCOUNTER — Ambulatory Visit (INDEPENDENT_AMBULATORY_CARE_PROVIDER_SITE_OTHER): Payer: Medicare HMO | Admitting: *Deleted

## 2023-06-08 DIAGNOSIS — E538 Deficiency of other specified B group vitamins: Secondary | ICD-10-CM

## 2023-06-08 NOTE — Progress Notes (Signed)
PATIENT IN OFFICE THIS MORNING FOR MONTHLY B 12 INJECTION. PATIENT TOLERATED WELL.

## 2023-07-06 ENCOUNTER — Ambulatory Visit (INDEPENDENT_AMBULATORY_CARE_PROVIDER_SITE_OTHER): Payer: Medicare HMO | Admitting: *Deleted

## 2023-07-06 DIAGNOSIS — E538 Deficiency of other specified B group vitamins: Secondary | ICD-10-CM

## 2023-07-06 NOTE — Progress Notes (Signed)
Pt in today for B12 shot, pt tolerated well.

## 2023-07-10 ENCOUNTER — Other Ambulatory Visit: Payer: Self-pay | Admitting: Nurse Practitioner

## 2023-07-10 DIAGNOSIS — E785 Hyperlipidemia, unspecified: Secondary | ICD-10-CM

## 2023-07-11 ENCOUNTER — Other Ambulatory Visit: Payer: Self-pay | Admitting: Nurse Practitioner

## 2023-07-11 DIAGNOSIS — D649 Anemia, unspecified: Secondary | ICD-10-CM

## 2023-07-12 ENCOUNTER — Ambulatory Visit: Payer: Medicare HMO

## 2023-07-12 VITALS — Ht 64.0 in | Wt 145.0 lb

## 2023-07-12 DIAGNOSIS — Z0000 Encounter for general adult medical examination without abnormal findings: Secondary | ICD-10-CM | POA: Diagnosis not present

## 2023-07-12 NOTE — Patient Instructions (Signed)
[name] , Thank you for taking time to come for your Medicare Wellness Visit. I appreciate your ongoing commitment to your health goals. Please review the following plan we discussed and let me know if I can assist you in the future.   Referrals/Orders/Follow-Ups/Clinician Recommendations: Aim for 30 minutes of exercise or brisk walking, 6-8 glasses of water, and 5 servings of fruits and vegetables each day.   This is a list of the screening recommended for you and due dates:  Health Maintenance  Topic Date Due   COVID-19 Vaccine (5 - 2023-24 season) 05/14/2023   Flu Shot  12/11/2023*   Mammogram  12/15/2023   Medicare Annual Wellness Visit  07/11/2024   DEXA scan (bone density measurement)  01/23/2025   DTaP/Tdap/Td vaccine (2 - Td or Tdap) 12/30/2026   Colon Cancer Screening  07/29/2027   Pneumonia Vaccine  Completed   Hepatitis C Screening  Completed   Zoster (Shingles) Vaccine  Completed   HPV Vaccine  Aged Out  *Topic was postponed. The date shown is not the original due date.    Advanced directives: (Provided) Advance directive discussed with you today. I have provided a copy for you to complete at home and have notarized. Once this is complete, please bring a copy in to our office so we can scan it into your chart. Information on Advanced Care Planning can be found at [facility] [place] Advance Health Care Directives Advance Health Care Directives ([email or web address]    Next Medicare Annual Wellness Visit scheduled for next year: Yes  Insert Preventive Care attachment Insert FALL PREVENTION attachment if needed

## 2023-07-12 NOTE — Progress Notes (Signed)
Subjective:   Phyllis Walker is a 77 y.o. female who presents for Medicare Annual (Subsequent) preventive examination.  Visit Complete: Virtual I connected with  Phyllis Walker on 07/12/23 by a audio enabled telemedicine application and verified that I am speaking with the correct person using two identifiers.  Patient Location: Home  Provider Location: Home Office  I discussed the limitations of evaluation and management by telemedicine. The patient expressed understanding and agreed to proceed.  Vital Signs: Because this visit was a virtual/telehealth visit, some criteria may be missing or patient reported. Any vitals not documented were not able to be obtained and vitals that have been documented are patient reported.  Patient Medicare AWV questionnaire was completed by the patient on 07/12/2023; I have confirmed that all information answered by patient is correct and no changes since this date.  Cardiac Risk Factors include: advanced age (>13men, >36 women);dyslipidemia     Objective:    Today's Vitals   07/12/23 1028  Weight: 145 lb (65.8 kg)  Height: 5\' 4"  (1.626 m)   Body mass index is 24.89 kg/m.     07/12/2023   10:32 AM 07/07/2022   11:57 AM 06/17/2021   10:40 AM 03/26/2019    9:53 AM 07/05/2018    6:45 AM 07/02/2018    9:48 AM 06/12/2018    9:37 AM  Advanced Directives  Does Patient Have a Medical Advance Directive? No No Yes No No No Yes  Type of Surveyor, minerals;Living will    Healthcare Power of Attorney  Copy of Healthcare Power of Attorney in Chart?   No - copy requested    No - copy requested  Would patient like information on creating a medical advance directive? Yes (MAU/Ambulatory/Procedural Areas - Information given) No - Patient declined  No - Patient declined No - Patient declined No - Patient declined No - Patient declined    Current Medications (verified) Outpatient Encounter Medications as of  07/12/2023  Medication Sig   albuterol (VENTOLIN HFA) 108 (90 Base) MCG/ACT inhaler Inhale 2 puffs into the lungs every 6 (six) hours as needed for wheezing or shortness of breath.   alendronate (FOSAMAX) 70 MG tablet Take 1 tablet (70 mg total) by mouth every 7 (seven) days. Take with a full glass of water on an empty stomach.   ALPRAZolam (XANAX) 0.25 MG tablet Take 1 tablet (0.25 mg total) by mouth 2 (two) times daily as needed for anxiety.   benazepril-hydrochlorthiazide (LOTENSIN HCT) 20-12.5 MG tablet 1/2 tablet Orally Once a day   diclofenac (VOLTAREN) 75 MG EC tablet TAKE ONE TABLET TWICE DAILY   ezetimibe (ZETIA) 10 MG tablet Take 1 tablet (10 mg total) by mouth daily.   ferrous sulfate (FEROSUL) 325 (65 FE) MG tablet TAKE ONE TABLET ONCE DAILY   gabapentin (NEURONTIN) 100 MG capsule TAKE ONE CAPSULE THREE TIMES DAILY   letrozole (FEMARA) 2.5 MG tablet Take 1 tablet (2.5 mg total) by mouth daily.   levothyroxine (SYNTHROID) 75 MCG tablet Take 1 tablet (75 mcg total) by mouth daily.   pantoprazole (PROTONIX) 20 MG tablet 1 tablet Orally Once a day 30 minutes before breakfast for 90 days   pregabalin (LYRICA) 50 MG capsule 1 capsule Orally Twice a day   promethazine-dextromethorphan (PROMETHAZINE-DM) 6.25-15 MG/5ML syrup Take 2.5-5 mLs by mouth 4 (four) times daily as needed for cough.   rOPINIRole (REQUIP) 0.25 MG tablet Take 1 tablet (0.25 mg total) by mouth 3 (three)  times daily.   sertraline (ZOLOFT) 50 MG tablet Take 1 tablet (50 mg total) by mouth daily.   simvastatin (ZOCOR) 20 MG tablet TAKE ONE TABLET EVERY EVENING ONCE DAILY   traZODone (DESYREL) 50 MG tablet TAKE ONE-HALF TO 1 TABLET AT BEDTIME AS NEEDED FOR SLEEP   Facility-Administered Encounter Medications as of 07/12/2023  Medication   cyanocobalamin ((VITAMIN B-12)) injection 1,000 mcg   methylPREDNISolone acetate (DEPO-MEDROL) injection 80 mg    Allergies (verified) Amoxicillin-pot clavulanate   History: Past  Medical History:  Diagnosis Date   Anemia    Anxiety    Arthritis    Breast cancer, right breast (HCC) 06/2018   Cataract    Depression    Fibroid tumor 1985   in breast   History of hiatal hernia    Hypercholesteremia    Hypertension    Hypothyroidism    Neuropathy    in feet   Restless leg syndrome    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   Past Surgical History:  Procedure Laterality Date   BREAST LUMPECTOMY  1987   benign fibroid tumor   BREAST LUMPECTOMY WITH RADIOACTIVE SEED AND SENTINEL LYMPH NODE BIOPSY Right 07/05/2018   Procedure: RIGHT BREAST LUMPECTOMY WITH RADIOACTIVE SEED X 2 AND RIGHT AXILLARY SENTINEL LYMPH NODE BIOPSY;  Surgeon: Ovidio Kin, MD;  Location:  [facility];  Service: General;  Laterality: Right;   BUNIONECTOMY Bilateral    CATARACT EXTRACTION     COLONOSCOPY     THYROIDECTOMY, PARTIAL  1969   TONSILLECTOMY     TOTAL KNEE ARTHROPLASTY Right 08/15/2016   Procedure: TOTAL KNEE ARTHROPLASTY;  Surgeon: Dannielle Huh, MD;  Location: [facility] OR;  Service: Orthopedics;  Laterality: Right;  RNFA   Family History  Problem Relation Age of Onset   Cancer Mother        pancreas   Heart attack Father    Hypertension Sister    Hyperlipidemia Sister    Anuerysm Sister    Hypertension Sister    Hyperlipidemia Sister    Early death Sister 5       accident   Ovarian cancer Maternal Aunt    Social History   Socioeconomic History   Marital status: Married    Spouse name: Not on file   Number of children: 2   Years of education: 12   Highest education level: 12th grade  Occupational History   Occupation: retired  Tobacco Use   Smoking status: Former    Current packs/day: 0.00    Average packs/day: 2.0 packs/day for 40.0 years (80.0 ttl pk-yrs)    Types: Cigarettes    Start date: 12/21/1966    Quit date: 12/21/2006    Years since quitting: 16.5   Smokeless tobacco: Never  Vaping Use   Vaping status: Never Used  Substance and Sexual  Activity   Alcohol use: No   Drug use: No   Sexual activity: Not Currently  Other Topics Concern   Not on file  Social History Narrative   Lives with her husband has one cat   One child in [place] and one in [place]   Social Determinants of Health   Financial Resource Strain: Low Risk  (07/12/2023)   Overall Financial Resource Strain (CARDIA)    Difficulty of Paying Living Expenses: Not hard at all  Food Insecurity: No Food Insecurity (07/12/2023)   Hunger Vital Sign    Worried About Running Out of Food in the Last Year: Never true  Ran Out of Food in the Last Year: Never true  Transportation Needs: No Transportation Needs (07/12/2023)   PRAPARE - Administrator, Civil Service (Medical): No    Lack of Transportation (Non-Medical): No  Physical Activity: Insufficiently Active (07/12/2023)   Exercise Vital Sign    Days of Exercise per Week: 3 days    Minutes of Exercise per Session: 30 min  Stress: No Stress Concern Present (07/12/2023)   [employer] of Occupational Health - Occupational Stress Questionnaire    Feeling of Stress : Not at all  Social Connections: Moderately Integrated (07/12/2023)   Social Connection and Isolation Panel [NHANES]    Frequency of Communication with Friends and Family: More than three times a week    Frequency of Social Gatherings with Friends and Family: More than three times a week    Attends Religious Services: More than 4 times per year    Active Member of [employer] or Organizations: No    Attends Engineer, structural: Never    Marital Status: Married    Tobacco Counseling Counseling given: Not Answered   Clinical Intake:  Pre-visit preparation completed: Yes  Pain : No/denies pain     Nutritional Risks: None Diabetes: No  How often do you need to have someone help you when you read instructions, pamphlets, or other written materials from your doctor or pharmacy?: 1 - Never  Interpreter Needed?:  No  Information entered by :: Renie Ora, LPN   Activities of Daily Living    07/12/2023   10:32 AM  In your present state of health, do you have any difficulty performing the following activities:  Hearing? 0  Vision? 0  Difficulty concentrating or making decisions? 0  Walking or climbing stairs? 0  Dressing or bathing? 0  Doing errands, shopping? 0  Preparing Food and eating ? N  Using the Toilet? N  In the past six months, have you accidently leaked urine? N  Do you have problems with loss of bowel control? N  Managing your Medications? N  Managing your Finances? N  Housekeeping or managing your Housekeeping? N    Patient Care Team: Bennie Pierini, FNP as PCP - General (Family Medicine) Marykay Lex, MD as PCP - Cardiology (Cardiology) Dannielle Huh, MD as Consulting Physician (Orthopedic Surgery) Harrington Challenger, NP (Inactive) as Nurse Practitioner (Obstetrics and Gynecology) Serena Croissant, MD as Consulting Physician (Hematology and Oncology) Ovidio Kin, MD as Consulting Physician (General Surgery) Dorothy Puffer, MD as Consulting Physician (Radiation Oncology) Danella Maiers, [facility] as Pharmacist (Family Medicine)  Indicate any recent Medical Services you may have received from other than [facility] providers in the past year (date may be approximate).     Assessment:   This is a routine wellness examination for Phyllis Walker.  Hearing/Vision screen Vision Screening - Comments:: Wears rx glasses - up to date with routine eye exams with  Dr.Davis   Goals Addressed             This Visit's Progress    DIET - INCREASE WATER INTAKE   On track    Try to drink 6-8 glasses of water daily.       Depression Screen    07/12/2023   10:31 AM 01/13/2023    8:30 AM 11/29/2022    1:58 PM 10/10/2022    7:59 AM 07/07/2022   12:00 PM 07/07/2022   11:56 AM 06/27/2022    2:33 PM  PHQ 2/9 Scores  PHQ - 2  Score 0 0 0 2 0 0 0  PHQ- 9 Score  0 2 6   1     Fall Risk     07/12/2023   10:29 AM 01/13/2023    8:30 AM 11/29/2022    1:58 PM 10/10/2022    7:59 AM 07/07/2022   12:00 PM  Fall Risk   Falls in the past year? 0 0 0 0 0  Number falls in past yr: 0  0    Injury with Fall? 0  0    Risk for fall due to : No Fall Risks      Follow up Falls prevention discussed    Falls evaluation completed    MEDICARE RISK AT HOME: Medicare Risk at Home Any stairs in or around the home?: Yes If so, are there any without handrails?: No Home free of loose throw rugs in walkways, pet beds, electrical cords, etc?: Yes Adequate lighting in your home to reduce risk of falls?: Yes Life alert?: No Use of a cane, walker or w/c?: No Grab bars in the bathroom?: Yes Shower chair or bench in shower?: Yes Elevated toilet seat or a handicapped toilet?: Yes  TIMED UP AND GO:  Was the test performed?  No    Cognitive Function:    01/02/2018   11:09 AM 12/29/2016   11:02 AM 05/27/2015    1:13 PM  MMSE - Mini Mental State Exam  Orientation to time 5 5 5   Orientation to Place 5 5 5   Registration 3 3 3   Attention/ Calculation 5 5 5   Recall 3 3 3   Language- name 2 objects 2 2 2   Language- repeat 1 1 1   Language- follow 3 step command 3 3 3   Language- read & follow direction 1 1 1   Write a sentence 1 1 1   Copy design 1 1 1   Total score 30 30 30         07/12/2023   10:32 AM 07/07/2022   11:58 AM 03/26/2019    9:55 AM  6CIT Screen  What Year? 0 points 0 points 0 points  What month? 0 points 0 points 0 points  What time? 0 points 0 points 0 points  Count back from 20 0 points 0 points 0 points  Months in reverse 0 points 0 points 0 points  Repeat phrase 0 points 0 points 0 points  Total Score 0 points 0 points 0 points    Immunizations Immunization History  Administered Date(s) Administered   Fluad Quad(high Dose 65+) 06/03/2019, 06/09/2020, 06/07/2021, 06/27/2022   Influenza Split 06/18/2013   Influenza,inj,Quad PF,6+ Mos 06/09/2014, 06/19/2015, 06/10/2017,  07/12/2018   Influenza-Unspecified 06/20/2016   Moderna Covid-19 Vaccine Bivalent Booster 57yrs & up 06/29/2021   Moderna Sars-Covid-2 Vaccination 10/08/2019, 11/05/2019, 08/11/2020   PNEUMOCOCCAL CONJUGATE-20 04/13/2023   Pneumococcal Conjugate-13 11/20/2014   Pneumococcal Polysaccharide-23 09/12/2010   Respiratory Syncytial Virus Vaccine,Recomb Aduvanted(Arexvy) 09/20/2022   Tdap 12/29/2016   Zoster Recombinant(Shingrix) 04/08/2021, 10/11/2021   Zoster, Live 05/24/2013    TDAP status: Up to date  Flu Vaccine status: Due, Education has been provided regarding the importance of this vaccine. Advised may receive this vaccine at local pharmacy or Health Dept. Aware to provide a copy of the vaccination record if obtained from local pharmacy or Health Dept. Verbalized acceptance and understanding.  Pneumococcal vaccine status: Up to date  Covid-19 vaccine status: Completed vaccines  Qualifies for Shingles Vaccine? Yes   Zostavax completed Yes   Shingrix Completed?: Yes  Screening Tests  Health Maintenance  Topic Date Due   COVID-19 Vaccine (5 - 2023-24 season) 05/14/2023   INFLUENZA VACCINE  12/11/2023 (Originally 04/13/2023)   MAMMOGRAM  12/15/2023   Medicare Annual Wellness (AWV)  07/11/2024   DEXA SCAN  01/23/2025   DTaP/Tdap/Td (2 - Td or Tdap) 12/30/2026   Colonoscopy  07/29/2027   Pneumonia Vaccine 39+ Years old  Completed   Hepatitis C Screening  Completed   Zoster Vaccines- Shingrix  Completed   HPV VACCINES  Aged Out    Health Maintenance  Health Maintenance Due  Topic Date Due   COVID-19 Vaccine (5 - 2023-24 season) 05/14/2023    Colorectal cancer screening: No longer required.   Mammogram status: No longer required due to age.  Bone Density status: Completed 01/24/2023. Results reflect: Bone density results: OSTEOPOROSIS. Repeat every 2 years.  Lung Cancer Screening: (Low Dose CT Chest recommended if Age 51-80 years, 20 pack-year currently smoking OR have  quit w/in 15years.) does not qualify.   Lung Cancer Screening Referral: n/a  Additional Screening:  Hepatitis C Screening: does not qualify;  Vision Screening: Recommended annual ophthalmology exams for early detection of glaucoma and other disorders of the eye. Is the patient up to date with their annual eye exam?  Yes  Who is the provider or what is the name of the office in which the patient attends annual eye exams? Dr.Davis If pt is not established with a provider, would they like to be referred to a provider to establish care? No .   Dental Screening: Recommended annual dental exams for proper oral hygiene   Community Resource Referral / Chronic Care Management: CRR required this visit?  No   CCM required this visit?  No     Plan:     I have personally reviewed and noted the following in the patient's chart:   Medical and social history Use of alcohol, tobacco or illicit drugs  Current medications and supplements including opioid prescriptions. Patient is not currently taking opioid prescriptions. Functional ability and status Nutritional status Physical activity Advanced directives List of other physicians Hospitalizations, surgeries, and ER visits in previous 12 months Vitals Screenings to include cognitive, depression, and falls Referrals and appointments  In addition, I have reviewed and discussed with patient certain preventive protocols, quality metrics, and best practice recommendations. A written personalized care plan for preventive services as well as general preventive health recommendations were provided to patient.     Lorrene Reid, LPN   [id number]/06/[id number]   After Visit Summary: (MyChart) Due to this being a telephonic visit, the after visit summary with patients personalized plan was offered to patient via MyChart   Nurse Notes: none

## 2023-07-14 ENCOUNTER — Other Ambulatory Visit: Payer: Self-pay | Admitting: Nurse Practitioner

## 2023-07-14 DIAGNOSIS — F411 Generalized anxiety disorder: Secondary | ICD-10-CM

## 2023-07-17 ENCOUNTER — Ambulatory Visit (INDEPENDENT_AMBULATORY_CARE_PROVIDER_SITE_OTHER): Payer: Medicare HMO | Admitting: Nurse Practitioner

## 2023-07-17 ENCOUNTER — Encounter: Payer: Self-pay | Admitting: Nurse Practitioner

## 2023-07-17 VITALS — BP 116/70 | HR 74 | Temp 97.1°F | Resp 20 | Ht 64.0 in | Wt 147.0 lb

## 2023-07-17 DIAGNOSIS — Z6831 Body mass index (BMI) 31.0-31.9, adult: Secondary | ICD-10-CM | POA: Diagnosis not present

## 2023-07-17 DIAGNOSIS — G629 Polyneuropathy, unspecified: Secondary | ICD-10-CM | POA: Diagnosis not present

## 2023-07-17 DIAGNOSIS — E785 Hyperlipidemia, unspecified: Secondary | ICD-10-CM | POA: Diagnosis not present

## 2023-07-17 DIAGNOSIS — F411 Generalized anxiety disorder: Secondary | ICD-10-CM

## 2023-07-17 DIAGNOSIS — Z23 Encounter for immunization: Secondary | ICD-10-CM

## 2023-07-17 DIAGNOSIS — E039 Hypothyroidism, unspecified: Secondary | ICD-10-CM | POA: Diagnosis not present

## 2023-07-17 DIAGNOSIS — I10 Essential (primary) hypertension: Secondary | ICD-10-CM | POA: Diagnosis not present

## 2023-07-17 DIAGNOSIS — G2581 Restless legs syndrome: Secondary | ICD-10-CM | POA: Diagnosis not present

## 2023-07-17 DIAGNOSIS — D649 Anemia, unspecified: Secondary | ICD-10-CM

## 2023-07-17 DIAGNOSIS — F3342 Major depressive disorder, recurrent, in full remission: Secondary | ICD-10-CM | POA: Diagnosis not present

## 2023-07-17 DIAGNOSIS — F5101 Primary insomnia: Secondary | ICD-10-CM

## 2023-07-17 DIAGNOSIS — M810 Age-related osteoporosis without current pathological fracture: Secondary | ICD-10-CM

## 2023-07-17 DIAGNOSIS — M80079D Age-related osteoporosis with current pathological fracture, unspecified ankle and foot, subsequent encounter for fracture with routine healing: Secondary | ICD-10-CM | POA: Diagnosis not present

## 2023-07-17 MED ORDER — TRAZODONE HCL 50 MG PO TABS: ORAL_TABLET | ORAL | 1 refills | Status: DC

## 2023-07-17 MED ORDER — ALENDRONATE SODIUM 70 MG PO TABS: 70.0000 mg | ORAL_TABLET | ORAL | 11 refills | Status: DC

## 2023-07-17 MED ORDER — EZETIMIBE 10 MG PO TABS: 10.0000 mg | ORAL_TABLET | Freq: Every day | ORAL | 1 refills | Status: DC

## 2023-07-17 MED ORDER — SIMVASTATIN 20 MG PO TABS: ORAL_TABLET | ORAL | 1 refills | Status: DC

## 2023-07-17 MED ORDER — FERROUS SULFATE 325 (65 FE) MG PO TABS: 325.0000 mg | ORAL_TABLET | Freq: Every day | ORAL | 1 refills | Status: DC

## 2023-07-17 MED ORDER — SERTRALINE HCL 50 MG PO TABS: 50.0000 mg | ORAL_TABLET | Freq: Every day | ORAL | 1 refills | Status: DC

## 2023-07-17 MED ORDER — PREGABALIN 50 MG PO CAPS: ORAL_CAPSULE | ORAL | 1 refills | Status: DC

## 2023-07-17 MED ORDER — ROPINIROLE HCL 0.25 MG PO TABS: 0.2500 mg | ORAL_TABLET | Freq: Three times a day (TID) | ORAL | 1 refills | Status: DC

## 2023-07-17 MED ORDER — PANTOPRAZOLE SODIUM 20 MG PO TBEC: DELAYED_RELEASE_TABLET | ORAL | 1 refills | Status: DC

## 2023-07-17 MED ORDER — BENAZEPRIL-HYDROCHLOROTHIAZIDE 20-12.5 MG PO TABS: ORAL_TABLET | ORAL | 1 refills | Status: DC

## 2023-07-17 MED ORDER — ALPRAZOLAM 0.25 MG PO TABS: 0.2500 mg | ORAL_TABLET | Freq: Two times a day (BID) | ORAL | 5 refills | Status: DC | PRN

## 2023-07-17 MED ORDER — LEVOTHYROXINE SODIUM 75 MCG PO TABS: 75.0000 ug | ORAL_TABLET | Freq: Every day | ORAL | 1 refills | Status: DC

## 2023-07-17 MED ORDER — GABAPENTIN 100 MG PO CAPS: ORAL_CAPSULE | ORAL | 1 refills | Status: DC

## 2023-07-17 NOTE — Patient Instructions (Signed)
Exercising to Stay Healthy To become healthy and stay healthy, it is recommended that you do moderate-intensity and vigorous-intensity exercise. You can tell that you are exercising at a moderate intensity if your heart starts beating faster and you start breathing faster but can still hold a conversation. You can tell that you are exercising at a vigorous intensity if you are breathing much harder and faster and cannot hold a conversation while exercising. How can exercise benefit me? Exercising regularly is important. It has many health benefits, such as: Improving overall fitness, flexibility, and endurance. Increasing bone density. Helping with weight control. Decreasing body fat. Increasing muscle strength and endurance. Reducing stress and tension, anxiety, depression, or anger. Improving overall health. What guidelines should I follow while exercising? Before you start a new exercise program, talk with your health care provider. Do not exercise so much that you hurt yourself, feel dizzy, or get very short of breath. Wear comfortable clothes and wear shoes with good support. Drink plenty of water while you exercise to prevent dehydration or heat stroke. Work out until your breathing and your heartbeat get faster (moderate intensity). How often should I exercise? Choose an activity that you enjoy, and set realistic goals. Your health care provider can help you make an activity plan that is individually designed and works best for you. Exercise regularly as told by your health care provider. This may include: Doing strength training two times a week, such as: Lifting weights. Using resistance bands. Push-ups. Sit-ups. Yoga. Doing a certain intensity of exercise for a given amount of time. Choose from these options: A total of 150 minutes of moderate-intensity exercise every week. A total of 75 minutes of vigorous-intensity exercise every week. A mix of moderate-intensity and  vigorous-intensity exercise every week. Children, pregnant women, people who have not exercised regularly, people who are overweight, and older adults may need to talk with a health care provider about what activities are safe to perform. If you have a medical condition, be sure to talk with your health care provider before you start a new exercise program. What are some exercise ideas? Moderate-intensity exercise ideas include: Walking 1 mile (1.6 km) in about 15 minutes. Biking. Hiking. Golfing. Dancing. Water aerobics. Vigorous-intensity exercise ideas include: Walking 4.5 miles (7.2 km) or more in about 1 hour. Jogging or running 5 miles (8 km) in about 1 hour. Biking 10 miles (16.1 km) or more in about 1 hour. Lap swimming. Roller-skating or in-line skating. Cross-country skiing. Vigorous competitive sports, such as football, basketball, and soccer. Jumping rope. Aerobic dancing. What are some everyday activities that can help me get exercise? Yard work, such as: Pushing a lawn mower. Raking and bagging leaves. Washing your car. Pushing a stroller. Shoveling snow. Gardening. Washing windows or floors. How can I be more active in my day-to-day activities? Use stairs instead of an elevator. Take a walk during your lunch break. If you drive, park your car farther away from your work or school. If you take public transportation, get off one stop early and walk the rest of the way. Stand up or walk around during all of your indoor phone calls. Get up, stretch, and walk around every 30 minutes throughout the day. Enjoy exercise with a friend. Support to continue exercising will help you keep a regular routine of activity. Where to find more information You can find more information about exercising to stay healthy from: U.S. Department of Health and Human Services: www.hhs.gov Centers for Disease Control and Prevention (  CDC): www.cdc.gov Summary Exercising regularly is  important. It will improve your overall fitness, flexibility, and endurance. Regular exercise will also improve your overall health. It can help you control your weight, reduce stress, and improve your bone density. Do not exercise so much that you hurt yourself, feel dizzy, or get very short of breath. Before you start a new exercise program, talk with your health care provider. This information is not intended to replace advice given to you by your health care provider. Make sure you discuss any questions you have with your health care provider. Document Revised: 12/25/2020 Document Reviewed: 12/25/2020 Elsevier Patient Education  2024 [employer]  

## 2023-07-17 NOTE — Progress Notes (Signed)
Subjective:    Patient ID: Phyllis Walker, female    DOB: 26-Feb-1946, 77 y.o.   MRN: [id number]   Chief Complaint: medical management of chronic issues     HPI:  Phyllis Walker is a 77 y.o. who identifies as a female who was assigned female at birth.   Social history: Lives with: husband- he has been taking treatments for cancer , so she has had a lot on her. Work history: retired   Water engineer in today for follow up of the following chronic medical issues:  1. Primary hypertension No c/o chest pain, sob or headache. Does not check blood pressure at home. BP Readings from Last 3 Encounters:  01/13/23 128/67  11/29/22 124/81  11/15/22 113/67    2. Hyperlipidemia with target LDL less than 100 Does not really watch diet and does no dedicated exercise. Lab Results  Component Value Date   CHOL 205 (H) 01/06/2023   HDL 45 01/06/2023   LDLCALC 135 (H) 01/06/2023   TRIG 142 01/06/2023   CHOLHDL 4.6 (H) 01/06/2023   The 10-year ASCVD risk score (Arnett DK, et al., 2019) is: 21.2%   3. Acquired hypothyroidism No issues that she is aware of. Lab Results  Component Value Date   TSH 0.573 01/06/2023     4. Recurrent major depressive disorder, in full remission (HC She has been on zoloft for several years. Is doing ok.    07/17/2023    7:56 AM 07/12/2023   10:31 AM 01/13/2023    8:30 AM  Depression screen PHQ 2/9  Decreased Interest 0 0 0  Down, Depressed, Hopeless 0 0 0  PHQ - 2 Score 0 0 0  Altered sleeping 0  0  Tired, decreased energy 0  0  Change in appetite 0  0  Feeling bad or failure about yourself  0  0  Trouble concentrating 0  0  Moving slowly or fidgety/restless 0  0  Suicidal thoughts 0  0  PHQ-9 Score 0  0  Difficult doing work/chores Not difficult at all  Not difficult at all    5. GAD (generalized anxiety disorder) Is on xanax BID. Her stress level has neen higher since her hsband has been on caner treatments.    07/17/2023    7:56 AM  01/13/2023    8:31 AM 11/29/2022    1:59 PM 10/10/2022    7:59 AM  GAD 7 : Generalized Anxiety Score  Nervous, Anxious, on Edge 0 0 1 2  Control/stop worrying 0 0 0 2  Worry too much - different things 0 0 1 2  Trouble relaxing 0 0 0 1  Restless 0 0 0 1  Easily annoyed or irritable 0 0 0 1  Afraid - awful might happen 0 0 0 1  Total GAD 7 Score 0 0 2 10  Anxiety Difficulty Not difficult at all Not difficult at all  Somewhat difficult      6. Anemia, unspecified type Is on daily iron supplement Lab Results  Component Value Date   HGB 11.3 01/06/2023     7. Primary insomnia Xanax usually helps her to sleep at night  8. Neuropathy Is on lyrica and still has lower ext pain at times  9. Restless leg syndrome Is on requip which is working well for her  10. Osteoporosis with pathological fracture of ankle and foot, unspecified laterality, with routine healing, subsequent encounter No weight bearing exercises. Last dexascan was done on 01/24/23. T score  was -3.3.  11. BMI 31.0-31.9,adult No recent weight changes Wt Readings from Last 3 Encounters:  07/17/23 147 lb (66.7 kg)  07/12/23 145 lb (65.8 kg)  01/13/23 151 lb (68.5 kg)   BMI Readings from Last 3 Encounters:  07/17/23 25.23 kg/m  07/12/23 24.89 kg/m  01/13/23 25.92 kg/m     New complaints: None today  Allergies  Allergen Reactions   Amoxicillin-Pot Clavulanate Nausea And Vomiting and Other (See Comments)    STOMACH CRAMPS  Has patient had a PCN reaction causing immediate rash, facial/tongue/throat swelling, SOB or lightheadedness with hypotension: No Has patient had a PCN reaction causing severe rash involving mucus membranes or skin necrosis: No Has patient had a PCN reaction that required hospitalization No Has patient had a PCN reaction occurring within the last 10 years: Yes If all of the above answers are "NO", then may proceed with Cephalosporin use.    Outpatient Encounter Medications as of  07/17/2023  Medication Sig   albuterol (VENTOLIN HFA) 108 (90 Base) MCG/ACT inhaler Inhale 2 puffs into the lungs every 6 (six) hours as needed for wheezing or shortness of breath.   alendronate (FOSAMAX) 70 MG tablet Take 1 tablet (70 mg total) by mouth every 7 (seven) days. Take with a full glass of water on an empty stomach.   ALPRAZolam (XANAX) 0.25 MG tablet Take 1 tablet (0.25 mg total) by mouth 2 (two) times daily as needed for anxiety.   benazepril-hydrochlorthiazide (LOTENSIN HCT) 20-12.5 MG tablet 1/2 tablet Orally Once a day   diclofenac (VOLTAREN) 75 MG EC tablet TAKE ONE TABLET TWICE DAILY   ezetimibe (ZETIA) 10 MG tablet Take 1 tablet (10 mg total) by mouth daily.   ferrous sulfate (FEROSUL) 325 (65 FE) MG tablet TAKE ONE TABLET ONCE DAILY   gabapentin (NEURONTIN) 100 MG capsule TAKE ONE CAPSULE THREE TIMES DAILY   letrozole (FEMARA) 2.5 MG tablet Take 1 tablet (2.5 mg total) by mouth daily.   levothyroxine (SYNTHROID) 75 MCG tablet Take 1 tablet (75 mcg total) by mouth daily.   pantoprazole (PROTONIX) 20 MG tablet 1 tablet Orally Once a day 30 minutes before breakfast for 90 days   pregabalin (LYRICA) 50 MG capsule 1 capsule Orally Twice a day   promethazine-dextromethorphan (PROMETHAZINE-DM) 6.25-15 MG/5ML syrup Take 2.5-5 mLs by mouth 4 (four) times daily as needed for cough.   rOPINIRole (REQUIP) 0.25 MG tablet Take 1 tablet (0.25 mg total) by mouth 3 (three) times daily.   sertraline (ZOLOFT) 50 MG tablet Take 1 tablet (50 mg total) by mouth daily.   simvastatin (ZOCOR) 20 MG tablet TAKE ONE TABLET EVERY EVENING ONCE DAILY   traZODone (DESYREL) 50 MG tablet TAKE ONE-HALF TO 1 TABLET AT BEDTIME AS NEEDED FOR SLEEP   Facility-Administered Encounter Medications as of 07/17/2023  Medication   cyanocobalamin ((VITAMIN B-12)) injection 1,000 mcg   methylPREDNISolone acetate (DEPO-MEDROL) injection 80 mg    Past Surgical History:  Procedure Laterality Date   BREAST LUMPECTOMY   1987   benign fibroid tumor   BREAST LUMPECTOMY WITH RADIOACTIVE SEED AND SENTINEL LYMPH NODE BIOPSY Right 07/05/2018   Procedure: RIGHT BREAST LUMPECTOMY WITH RADIOACTIVE SEED X 2 AND RIGHT AXILLARY SENTINEL LYMPH NODE BIOPSY;  Surgeon: Ovidio Kin, MD;  Location: [facility];  Service: General;  Laterality: Right;   BUNIONECTOMY Bilateral    CATARACT EXTRACTION     COLONOSCOPY     THYROIDECTOMY, PARTIAL  1969   TONSILLECTOMY     TOTAL KNEE ARTHROPLASTY  Right 08/15/2016   Procedure: TOTAL KNEE ARTHROPLASTY;  Surgeon: Dannielle Huh, MD;  Location: [facility];  Service: Orthopedics;  Laterality: Right;  RNFA    Family History  Problem Relation Age of Onset   Cancer Mother        pancreas   Heart attack Father    Hypertension Sister    Hyperlipidemia Sister    Anuerysm Sister    Hypertension Sister    Hyperlipidemia Sister    Early death Sister 5       accident   Ovarian cancer Maternal Aunt       Controlled substance contract: n/a     Review of Systems  Constitutional:  Negative for diaphoresis.  Eyes:  Negative for pain.  Respiratory:  Negative for shortness of breath.   Cardiovascular:  Negative for chest pain, palpitations and leg swelling.  Gastrointestinal:  Negative for abdominal pain.  Endocrine: Negative for polydipsia.  Skin:  Negative for rash.  Neurological:  Negative for dizziness, weakness and headaches.  Hematological:  Does not bruise/bleed easily.  All other systems reviewed and are negative.      Objective:   Physical Exam Vitals and nursing note reviewed.  Constitutional:      General: She is not in acute distress.    Appearance: Normal appearance. She is well-developed.  HENT:     Head: Normocephalic.     Right Ear: Tympanic membrane normal.     Left Ear: Tympanic membrane normal.     Nose: Nose normal.     Mouth/Throat:     Mouth: Mucous membranes are moist.  Eyes:     Pupils: Pupils are equal, round, and reactive to light.   Neck:     Vascular: No carotid bruit or JVD.  Cardiovascular:     Rate and Rhythm: Normal rate and regular rhythm.     Heart sounds: Normal heart sounds.  Pulmonary:     Effort: Pulmonary effort is normal. No respiratory distress.     Breath sounds: Normal breath sounds. No wheezing or rales.  Chest:     Chest wall: No tenderness.  Abdominal:     General: Bowel sounds are normal. There is no distension or abdominal bruit.     Palpations: Abdomen is soft. There is no hepatomegaly, splenomegaly, mass or pulsatile mass.     Tenderness: There is no abdominal tenderness.  Musculoskeletal:        General: Normal range of motion.     Cervical back: Normal range of motion and neck supple.  Lymphadenopathy:     Cervical: No cervical adenopathy.  Skin:    General: Skin is warm and dry.  Neurological:     Mental Status: She is alert and oriented to person, place, and time.     Deep Tendon Reflexes: Reflexes are normal and symmetric.  Psychiatric:        Behavior: Behavior normal.        Thought Content: Thought content normal.        Judgment: Judgment normal.     BP 116/70   Pulse 74   Temp (!) 97.1 F (36.2 C) (Temporal)   Resp 20   Ht 5\' 4"  (1.626 m)   Wt 147 lb (66.7 kg)   SpO2 96%   BMI 25.23 kg/m        Assessment & Plan:   Phyllis Walker comes in today with chief complaint of Medical Management of Chronic Issues   Diagnosis and orders addressed:  1.  Primary hypertension Low sodium diet - benazepril-hydrochlorthiazide (LOTENSIN HCT) 20-12.5 MG tablet; 1/2 tablet Orally Once a day  Dispense: 90 tablet; Refill: 1 - CBC with Differential/Platelet - CMP14+EGFR  2. Hyperlipidemia with target LDL less than 100 Low fat diet - ezetimibe (ZETIA) 10 MG tablet; Take 1 tablet (10 mg total) by mouth daily.  Dispense: 90 tablet; Refill: 1 - simvastatin (ZOCOR) 20 MG tablet; TAKE ONE TABLET EVERY EVENING ONCE DAILY  Dispense: 90 tablet; Refill: 1 - Lipid panel  3.  Acquired hypothyroidism Labs pending  - levothyroxine (SYNTHROID) 75 MCG tablet; Take 1 tablet (75 mcg total) by mouth daily.  Dispense: 90 tablet; Refill: 1 - Thyroid Panel With TSH  4. Recurrent major depressive disorder, in full remission (HCC) Stress management - sertraline (ZOLOFT) 50 MG tablet; Take 1 tablet (50 mg total) by mouth daily.  Dispense: 90 tablet; Refill: 1  5. GAD (generalized anxiety disorder) - ALPRAZolam (XANAX) 0.25 MG tablet; Take 1 tablet (0.25 mg total) by mouth 2 (two) times daily as needed for anxiety.  Dispense: 60 tablet; Refill: 5  6. Anemia, unspecified type Labs pending - ferrous sulfate (FEROSUL) 325 (65 FE) MG tablet; Take 1 tablet (325 mg total) by mouth daily.  Dispense: 90 tablet; Refill: 1  7. Primary insomnia Bedtime routine - traZODone (DESYREL) 50 MG tablet; TAKE ONE-HALF TO 1 TABLET AT BEDTIME AS NEEDED FOR SLEEP  Dispense: 90 tablet; Refill: 1  8. Neuropathy Fdo not go barefooted - gabapentin (NEURONTIN) 100 MG capsule; TAKE ONE CAPSULE THREE TIMES DAILY  Dispense: 270 capsule; Refill: 1 - pregabalin (LYRICA) 50 MG capsule; 1 capsule Orally Twice a day  Dispense: 90 capsule; Refill: 1  9. Osteoporosis with pathological fracture of ankle and foot, unspecified laterality, with routine healing, subsequent encounter Weight bearing exercises - alendronate (FOSAMAX) 70 MG tablet; Take 1 tablet (70 mg total) by mouth every 7 (seven) days. Take with a full glass of water on an empty stomach.  Dispense: 4 tablet; Refill: 11  10. BMI 31.0-31.9,adult Discussed diet and exercise for person with BMI >25 Will recheck weight in 3-6 months   11. Restless leg syndrome Keep  legs warm at night - rOPINIRole (REQUIP) 0.25 MG tablet; Take 1 tablet (0.25 mg total) by mouth 3 (three) times daily.  Dispense: 270 tablet; Refill: 1   Labs pending Health Maintenance reviewed Diet and exercise encouraged  Follow up plan: 6 months   Mary-Margaret Daphine Deutscher,  FNP

## 2023-07-18 LAB — CMP14+EGFR
ALT: 11 IU/L (ref 0–32)
AST: 16 [IU]/L (ref 0–40)
Albumin: 4.3 g/dL (ref 3.8–4.8)
Alkaline Phosphatase: 113 IU/L (ref 44–121)
BUN/Creatinine Ratio: 20 (ref 12–28)
BUN: 29 mg/dL — ABNORMAL HIGH (ref 8–27)
Bilirubin Total: 0.4 mg/dL (ref 0.0–1.2)
CO2: 22 mmol/L (ref 20–29)
Calcium: 8.7 mg/dL (ref 8.7–10.3)
Chloride: 107 mmol/L — ABNORMAL HIGH (ref 96–106)
Creatinine, Ser: 1.42 mg/dL — ABNORMAL HIGH (ref 0.57–1.00)
Globulin, Total: 1.9 g/dL (ref 1.5–4.5)
Glucose: 85 mg/dL (ref 70–99)
Potassium: 5.1 mmol/L (ref 3.5–5.2)
Sodium: 144 mmol/L (ref 134–144)
Total Protein: 6.2 g/dL (ref 6.0–8.5)
eGFR: 38 mL/min/{1.73_m2} — ABNORMAL LOW (ref >59)

## 2023-07-18 LAB — CBC WITH DIFFERENTIAL/PLATELET
Basophils Absolute: 0.1 10*3/uL (ref 0.0–0.2)
Basos: 1 %
EOS (ABSOLUTE): 0.4 10*3/uL (ref 0.0–0.4)
Eos: 7 %
Hematocrit: 37.2 % (ref 34.0–46.6)
Hemoglobin: 11.4 g/dL (ref 11.1–15.9)
Immature Grans (Abs): 0 10*3/uL (ref 0.0–0.1)
Immature Granulocytes: 0 %
Lymphocytes Absolute: 1.3 10*3/uL (ref 0.7–3.1)
Lymphs: 23 %
MCH: 29.3 pg (ref 26.6–33.0)
MCHC: 30.6 g/dL — ABNORMAL LOW (ref 31.5–35.7)
MCV: 96 fL (ref 79–97)
Monocytes Absolute: 0.3 10*3/uL (ref 0.1–0.9)
Monocytes: 6 %
Neutrophils Absolute: 3.4 10*3/uL (ref 1.4–7.0)
Neutrophils: 63 %
Platelets: 176 10*3/uL (ref 150–450)
RBC: 3.89 x10E6/uL (ref 3.77–5.28)
RDW: 12.2 % (ref 11.7–15.4)
WBC: 5.4 10*3/uL (ref 3.4–10.8)

## 2023-07-18 LAB — THYROID PANEL WITH TSH
Free Thyroxine Index: 1.7 (ref 1.2–4.9)
T3 Uptake Ratio: 24 % (ref 24–39)
T4, Total: 7.2 ug/dL (ref 4.5–12.0)
TSH: 1.48 u[IU]/mL (ref 0.450–4.500)

## 2023-07-18 LAB — LIPID PANEL
Chol/HDL Ratio: 3.8 ratio (ref 0.0–4.4)
Cholesterol, Total: 163 mg/dL (ref 100–199)
HDL: 43 mg/dL (ref >39)
LDL Chol Calc (NIH): 99 mg/dL (ref 0–99)
Triglycerides: 113 mg/dL (ref 0–149)
VLDL Cholesterol Cal: 21 mg/dL (ref 5–40)

## 2023-08-08 ENCOUNTER — Ambulatory Visit: Payer: Medicare HMO

## 2023-08-08 ENCOUNTER — Ambulatory Visit (INDEPENDENT_AMBULATORY_CARE_PROVIDER_SITE_OTHER): Payer: Medicare HMO

## 2023-08-08 DIAGNOSIS — E538 Deficiency of other specified B group vitamins: Secondary | ICD-10-CM

## 2023-08-08 NOTE — Progress Notes (Signed)
Cyanocobalamin injection given to left deltoid.  Patient tolerated well. 

## 2023-09-05 ENCOUNTER — Other Ambulatory Visit: Payer: Self-pay | Admitting: Nurse Practitioner

## 2023-09-07 ENCOUNTER — Ambulatory Visit (INDEPENDENT_AMBULATORY_CARE_PROVIDER_SITE_OTHER): Payer: Medicare HMO | Admitting: *Deleted

## 2023-09-07 DIAGNOSIS — E538 Deficiency of other specified B group vitamins: Secondary | ICD-10-CM | POA: Diagnosis not present

## 2023-09-07 NOTE — Progress Notes (Signed)
Pt given B12 injection IM left deltoid and tolerated well. °

## 2023-09-07 NOTE — Telephone Encounter (Signed)
Aware refill sent to pharmacy ?

## 2023-09-07 NOTE — Telephone Encounter (Signed)
Pt here at office asking about this refill request. Explained that request was sent to [facility] and we are waiting for her to advise. Pt voiced understanding.

## 2023-10-09 ENCOUNTER — Ambulatory Visit: Payer: Medicare HMO

## 2023-10-10 ENCOUNTER — Ambulatory Visit (INDEPENDENT_AMBULATORY_CARE_PROVIDER_SITE_OTHER): Payer: Medicare HMO

## 2023-10-10 DIAGNOSIS — E538 Deficiency of other specified B group vitamins: Secondary | ICD-10-CM | POA: Diagnosis not present

## 2023-10-10 NOTE — Progress Notes (Signed)
Patient is in office today for a nurse visit for B12 Injection. Patient Injection was given in the  Left deltoid. Patient tolerated injection well.

## 2023-10-18 DIAGNOSIS — Z809 Family history of malignant neoplasm, unspecified: Secondary | ICD-10-CM | POA: Diagnosis not present

## 2023-10-18 DIAGNOSIS — J449 Chronic obstructive pulmonary disease, unspecified: Secondary | ICD-10-CM | POA: Diagnosis not present

## 2023-10-18 DIAGNOSIS — K219 Gastro-esophageal reflux disease without esophagitis: Secondary | ICD-10-CM | POA: Diagnosis not present

## 2023-10-18 DIAGNOSIS — Z008 Encounter for other general examination: Secondary | ICD-10-CM | POA: Diagnosis not present

## 2023-10-18 DIAGNOSIS — Z8249 Family history of ischemic heart disease and other diseases of the circulatory system: Secondary | ICD-10-CM | POA: Diagnosis not present

## 2023-10-18 DIAGNOSIS — M810 Age-related osteoporosis without current pathological fracture: Secondary | ICD-10-CM | POA: Diagnosis not present

## 2023-10-18 DIAGNOSIS — M1990 Unspecified osteoarthritis, unspecified site: Secondary | ICD-10-CM | POA: Diagnosis not present

## 2023-10-18 DIAGNOSIS — R32 Unspecified urinary incontinence: Secondary | ICD-10-CM | POA: Diagnosis not present

## 2023-10-18 DIAGNOSIS — F324 Major depressive disorder, single episode, in partial remission: Secondary | ICD-10-CM | POA: Diagnosis not present

## 2023-10-18 DIAGNOSIS — E785 Hyperlipidemia, unspecified: Secondary | ICD-10-CM | POA: Diagnosis not present

## 2023-10-18 DIAGNOSIS — Z87891 Personal history of nicotine dependence: Secondary | ICD-10-CM | POA: Diagnosis not present

## 2023-10-18 DIAGNOSIS — N1831 Chronic kidney disease, stage 3a: Secondary | ICD-10-CM | POA: Diagnosis not present

## 2023-10-18 DIAGNOSIS — I129 Hypertensive chronic kidney disease with stage 1 through stage 4 chronic kidney disease, or unspecified chronic kidney disease: Secondary | ICD-10-CM | POA: Diagnosis not present

## 2023-10-23 ENCOUNTER — Other Ambulatory Visit: Payer: Self-pay | Admitting: Family

## 2023-11-10 ENCOUNTER — Ambulatory Visit (INDEPENDENT_AMBULATORY_CARE_PROVIDER_SITE_OTHER): Payer: Medicare HMO

## 2023-11-10 DIAGNOSIS — E538 Deficiency of other specified B group vitamins: Secondary | ICD-10-CM

## 2023-11-10 NOTE — Progress Notes (Addendum)
 Patient is in office today for a nurse visit for B12 Injection. Patient Injection was given in the  Left deltoid. Patient tolerated injection well.

## 2023-12-11 ENCOUNTER — Ambulatory Visit (INDEPENDENT_AMBULATORY_CARE_PROVIDER_SITE_OTHER): Payer: Medicare HMO

## 2023-12-11 DIAGNOSIS — E538 Deficiency of other specified B group vitamins: Secondary | ICD-10-CM

## 2023-12-11 NOTE — Progress Notes (Signed)
 Patient is in office today for a nurse visit for B12 Injection. Patient Injection was given in the  Left deltoid. Patient tolerated injection well.

## 2023-12-18 ENCOUNTER — Ambulatory Visit (INDEPENDENT_AMBULATORY_CARE_PROVIDER_SITE_OTHER): Admitting: Nurse Practitioner

## 2023-12-18 ENCOUNTER — Encounter: Payer: Self-pay | Admitting: Nurse Practitioner

## 2023-12-18 ENCOUNTER — Ambulatory Visit: Payer: Self-pay

## 2023-12-18 VITALS — BP 119/65 | HR 81 | Temp 97.4°F | Ht 64.0 in | Wt 143.0 lb

## 2023-12-18 DIAGNOSIS — F411 Generalized anxiety disorder: Secondary | ICD-10-CM

## 2023-12-18 DIAGNOSIS — R300 Dysuria: Secondary | ICD-10-CM | POA: Diagnosis not present

## 2023-12-18 DIAGNOSIS — N3001 Acute cystitis with hematuria: Secondary | ICD-10-CM

## 2023-12-18 LAB — MICROSCOPIC EXAMINATION
Epithelial Cells (non renal): NONE SEEN /HPF (ref 0–10)
Renal Epithel, UA: NONE SEEN /HPF
WBC, UA: >30 /HPF — AB (ref 0–5)
Yeast, UA: NONE SEEN

## 2023-12-18 LAB — URINALYSIS, ROUTINE W REFLEX MICROSCOPIC
Bilirubin, UA: NEGATIVE
Glucose, UA: NEGATIVE
Nitrite, UA: NEGATIVE
Specific Gravity, UA: 1.015 (ref 1.005–1.030)
Urobilinogen, Ur: 1 mg/dL (ref 0.2–1.0)
pH, UA: 6 (ref 5.0–7.5)

## 2023-12-18 MED ORDER — ALPRAZOLAM 0.25 MG PO TABS: 0.2500 mg | ORAL_TABLET | Freq: Two times a day (BID) | ORAL | 5 refills | Status: DC | PRN

## 2023-12-18 MED ORDER — SULFAMETHOXAZOLE-TRIMETHOPRIM 800-160 MG PO TABS: 1.0000 | ORAL_TABLET | Freq: Two times a day (BID) | ORAL | 0 refills | Status: DC

## 2023-12-18 NOTE — Patient Instructions (Signed)

## 2023-12-18 NOTE — Telephone Encounter (Signed)
  Chief Complaint: bladder pressure Symptoms: urgency, pressure in bladder, low back pain Frequency: started yesterday Pertinent Negatives: Patient denies fever, N/V/D, ABD pain, blood in urine, pain with urination Disposition: [] ED /[] Urgent Care (no appt availability in office) / [x] Appointment(In office/virtual)/ []  [facility]/ [] Home Care/ [] Refused Recommended Disposition /[] [facility]/ []  Follow-up with PCP Additional Notes: Pt states that she has hx of UTI. Pt states that this started yesterday and currently her s/s are: low back pain, urgency-states she feels like she has to go then only minimal amount comes out, and she also feels bladder pressure. Pt sched with PCP today.  Copied from CRM [id number]. Topic: Clinical - Medication Question >> Dec 18, 2023  8:28 AM Alcus Dad wrote: Reason for CRM: Patient needs something for bladder infection Reason for Disposition  Side (flank) or lower back pain present  Answer Assessment - Initial Assessment Questions 1. SYMPTOM: "What's the main symptom you're concerned about?" (e.g., frequency, incontinence)     Pressure in bladder 2. ONSET: "When did the  pressure in bladder  start?"     yesterday 3. PAIN: "Is there any pain?" If Yes, ask: "How bad is it?" (Scale: 1-10; mild, moderate, severe)     Low back pain, 5 4. CAUSE: "What do you think is causing the symptoms?"     UTI 5. OTHER SYMPTOMS: "Do you have any other symptoms?" (e.g., blood in urine, fever, flank pain, pain with urination)     denies  Protocols used: Urinary Symptoms-A-AH

## 2023-12-18 NOTE — Addendum Note (Signed)
 Addended by: Bennie Pierini on: 12/18/2023 09:55 AM   Modules accepted: Orders

## 2023-12-18 NOTE — Progress Notes (Signed)
   Subjective:    Patient ID: Phyllis Walker, female    DOB: [birth year], 78 y.o.   MRN: [id number]   Chief Complaint: dysuria  Dysuria  This is a new problem. The current episode started in the past 7 days. The quality of the pain is described as burning. The pain is at a severity of 5/10. The pain is moderate. There has been no fever. She is Not sexually active. Associated symptoms include frequency, hesitancy and urgency. She has tried nothing for the symptoms. The treatment provided mild relief.    Patient Active Problem List   Diagnosis Date Noted   Malignant neoplasm of upper-inner quadrant of right breast in female, estrogen receptor positive (HCC) 05/31/2018   Chronic right shoulder pain 04/11/2017   S/P total knee replacement 08/15/2016   BMI 31.0-31.9,adult 06/19/2015   Anemia 05/27/2015   Insomnia 03/09/2015   Osteoporosis with pathological fracture of ankle and foot 05/22/2014   Hypothyroidism 05/12/2014   Hyperlipidemia with target LDL less than 100 05/12/2014   Restless leg syndrome 02/21/2014   Neuropathy 02/21/2014   Unilateral primary osteoarthritis, right knee 05/23/2013   GAD (generalized anxiety disorder) 12/21/2012   Primary hypertension 12/21/2012   Depression 12/21/2012       Review of Systems  Genitourinary:  Positive for dysuria, frequency, hesitancy and urgency.       Objective:   Physical Exam Constitutional:      Appearance: Normal appearance.  Cardiovascular:     Rate and Rhythm: Normal rate and regular rhythm.     Heart sounds: Normal heart sounds.  Pulmonary:     Effort: Pulmonary effort is normal.     Breath sounds: Normal breath sounds.  Abdominal:     Tenderness: There is abdominal tenderness (mild suprapubic pain on palpation). There is no right CVA tenderness or left CVA tenderness.  Skin:    General: Skin is warm.  Neurological:     General: No focal deficit present.     Mental Status: She is alert and oriented to person,  place, and time.  Psychiatric:        Mood and Affect: Mood normal.        Behavior: Behavior normal.     BP 119/65   Pulse 81   Temp (!) 97.4 F (36.3 C) (Temporal)   Ht 5\' 4"  (1.626 m)   Wt 143 lb (64.9 kg)   BMI 24.55 kg/m        Assessment & Plan:   Phyllis Walker in today with chief complaint of No chief complaint on file.   1. Dysuria (Primary)  - Urinalysis, Routine w reflex microscopic - Urine Culture  2. Acute cystitis with hematuria Take medication as prescribe Cotton underwear Take shower not bath Cranberry juice, yogurt Force fluids AZO over the counter X2 days Culture pending RTO prn  - sulfamethoxazole-trimethoprim (BACTRIM DS) 800-160 MG tablet; Take 1 tablet by mouth 2 (two) times daily.  Dispense: 20 tablet; Refill: 0    The above assessment and management plan was discussed with the patient. The patient verbalized understanding of and has agreed to the management plan. Patient is aware to call the clinic if symptoms persist or worsen. Patient is aware when to return to the clinic for a follow-up visit. Patient educated on when it is appropriate to go to the emergency department.   Mary-Margaret Daphine Deutscher, FNP

## 2023-12-20 LAB — URINE CULTURE

## 2023-12-25 ENCOUNTER — Other Ambulatory Visit: Payer: Self-pay | Admitting: Nurse Practitioner

## 2023-12-25 ENCOUNTER — Ambulatory Visit
Admission: RE | Admit: 2023-12-25 | Discharge: 2023-12-25 | Disposition: A | Discharge status: Home / Self Care | Source: Ambulatory Visit | Attending: Nurse Practitioner | Admitting: Nurse Practitioner

## 2023-12-25 DIAGNOSIS — Z1231 Encounter for screening mammogram for malignant neoplasm of breast: Secondary | ICD-10-CM | POA: Diagnosis not present

## 2024-01-11 ENCOUNTER — Ambulatory Visit (INDEPENDENT_AMBULATORY_CARE_PROVIDER_SITE_OTHER)

## 2024-01-11 DIAGNOSIS — E538 Deficiency of other specified B group vitamins: Secondary | ICD-10-CM

## 2024-01-11 NOTE — Progress Notes (Signed)
 Patient is in office today for a nurse visit for B12 Injection. Patient Injection was given in the  Left deltoid. Patient tolerated injection well.

## 2024-01-15 ENCOUNTER — Ambulatory Visit (INDEPENDENT_AMBULATORY_CARE_PROVIDER_SITE_OTHER): Payer: Medicare HMO | Admitting: Nurse Practitioner

## 2024-01-15 ENCOUNTER — Encounter: Payer: Self-pay | Admitting: Nurse Practitioner

## 2024-01-15 VITALS — BP 112/66 | HR 75 | Temp 97.5°F | Ht 64.0 in | Wt 142.0 lb

## 2024-01-15 DIAGNOSIS — M80079D Age-related osteoporosis with current pathological fracture, unspecified ankle and foot, subsequent encounter for fracture with routine healing: Secondary | ICD-10-CM

## 2024-01-15 DIAGNOSIS — G629 Polyneuropathy, unspecified: Secondary | ICD-10-CM

## 2024-01-15 DIAGNOSIS — D649 Anemia, unspecified: Secondary | ICD-10-CM | POA: Diagnosis not present

## 2024-01-15 DIAGNOSIS — Z6831 Body mass index (BMI) 31.0-31.9, adult: Secondary | ICD-10-CM | POA: Diagnosis not present

## 2024-01-15 DIAGNOSIS — I10 Essential (primary) hypertension: Secondary | ICD-10-CM | POA: Diagnosis not present

## 2024-01-15 DIAGNOSIS — E039 Hypothyroidism, unspecified: Secondary | ICD-10-CM

## 2024-01-15 DIAGNOSIS — G2581 Restless legs syndrome: Secondary | ICD-10-CM

## 2024-01-15 DIAGNOSIS — E785 Hyperlipidemia, unspecified: Secondary | ICD-10-CM

## 2024-01-15 DIAGNOSIS — F411 Generalized anxiety disorder: Secondary | ICD-10-CM

## 2024-01-15 DIAGNOSIS — F5101 Primary insomnia: Secondary | ICD-10-CM

## 2024-01-15 DIAGNOSIS — Z0000 Encounter for general adult medical examination without abnormal findings: Secondary | ICD-10-CM

## 2024-01-15 DIAGNOSIS — M810 Age-related osteoporosis without current pathological fracture: Secondary | ICD-10-CM

## 2024-01-15 DIAGNOSIS — Z0001 Encounter for general adult medical examination with abnormal findings: Secondary | ICD-10-CM

## 2024-01-15 DIAGNOSIS — F3342 Major depressive disorder, recurrent, in full remission: Secondary | ICD-10-CM | POA: Diagnosis not present

## 2024-01-15 MED ORDER — BENAZEPRIL-HYDROCHLOROTHIAZIDE 20-12.5 MG PO TABS: ORAL_TABLET | ORAL | 1 refills | Status: DC

## 2024-01-15 MED ORDER — TRAZODONE HCL 50 MG PO TABS: ORAL_TABLET | ORAL | 1 refills | Status: DC

## 2024-01-15 MED ORDER — GABAPENTIN 100 MG PO CAPS
ORAL_CAPSULE | ORAL | 1 refills | Status: DC
Start: 2024-01-15 — End: 2024-07-16

## 2024-01-15 MED ORDER — ALPRAZOLAM 0.25 MG PO TABS: 0.2500 mg | ORAL_TABLET | Freq: Two times a day (BID) | ORAL | 5 refills | Status: DC | PRN

## 2024-01-15 MED ORDER — FERROUS SULFATE 325 (65 FE) MG PO TABS: 325.0000 mg | ORAL_TABLET | Freq: Every day | ORAL | 1 refills | Status: DC

## 2024-01-15 MED ORDER — SERTRALINE HCL 50 MG PO TABS: 50.0000 mg | ORAL_TABLET | Freq: Every day | ORAL | 1 refills | Status: DC

## 2024-01-15 MED ORDER — ROPINIROLE HCL 0.25 MG PO TABS: 0.2500 mg | ORAL_TABLET | Freq: Three times a day (TID) | ORAL | 1 refills | Status: DC

## 2024-01-15 MED ORDER — EZETIMIBE 10 MG PO TABS: 10.0000 mg | ORAL_TABLET | Freq: Every day | ORAL | 1 refills | Status: DC

## 2024-01-15 MED ORDER — SIMVASTATIN 20 MG PO TABS: ORAL_TABLET | ORAL | 1 refills | Status: DC

## 2024-01-15 MED ORDER — LEVOTHYROXINE SODIUM 75 MCG PO TABS: 75.0000 ug | ORAL_TABLET | Freq: Every day | ORAL | 1 refills | Status: DC

## 2024-01-15 MED ORDER — PANTOPRAZOLE SODIUM 20 MG PO TBEC
DELAYED_RELEASE_TABLET | ORAL | 1 refills | Status: DC
Start: 2024-01-15 — End: 2024-07-16

## 2024-01-15 MED ORDER — ALENDRONATE SODIUM 70 MG PO TABS: 70.0000 mg | ORAL_TABLET | ORAL | 11 refills | Status: DC

## 2024-01-15 NOTE — Patient Instructions (Signed)
Restless Legs Syndrome Restless legs syndrome is a condition that causes uncomfortable feelings or sensations in the legs, especially while sitting or lying down. The sensations usually cause an overwhelming urge to move the legs. The arms can also sometimes be affected. The condition can range from mild to severe. The symptoms often interfere with a person's ability to sleep. What are the causes? The cause of this condition is not known. What increases the risk? The following factors may make you more likely to develop this condition: Being older than 50. Pregnancy. Being a woman. In general, the condition is more common in women than in men. A family history of the condition. Having iron deficiency. Overuse of caffeine, nicotine, or alcohol. Certain medical conditions, such as kidney disease, Parkinson's disease, or nerve damage. Certain medicines, such as those for high blood pressure, nausea, colds, allergies, depression, and some heart conditions. What are the signs or symptoms? The main symptom of this condition is uncomfortable sensations in the legs, such as: Pulling. Tingling. Prickling. Throbbing. Crawling. Burning. Usually, the sensations: Affect both sides of the body. Are worse when you sit or lie down. Are worse at night. These may make it difficult to fall asleep. Make you have a strong urge to move your legs. Are temporarily relieved by moving your legs or standing. The arms can also be affected, but this is rare. People who have this condition often have tiredness during the day because of their lack of sleep at night. How is this diagnosed? This condition may be diagnosed based on: Your symptoms. Blood tests. In some cases, you may be monitored in a sleep lab by a specialist (a sleep study). This can detect any disruptions in your sleep. How is this treated? This condition is treated by managing the symptoms. This may include: Lifestyle changes, such as  exercising, using relaxation techniques, and avoiding caffeine, alcohol, or tobacco. Iron supplements. Medicines. Parkinson's medications may be tried first. Anti-seizure medications can also be helpful. Follow these instructions at home: General instructions Take over-the-counter and prescription medicines only as told by your health care provider. Use methods to help relieve the uncomfortable sensations, such as: Massaging your legs. Walking or stretching. Taking a cold or hot bath. Keep all follow-up visits. This is important. Lifestyle     Practice good sleep habits. For example, go to bed and get up at the same time every day. Most adults should get 7-9 hours of sleep each night. Exercise regularly. Try to get at least 30 minutes of exercise most days of the week. Practice ways of relaxing, such as yoga or meditation. Avoid caffeine and alcohol. Do not use any products that contain nicotine or tobacco. These products include cigarettes, chewing tobacco, and vaping devices, such as e-cigarettes. If you need help quitting, ask your health care provider. Where to find more information National Institute of Neurological Disorders and Stroke: www.ninds.nih.gov Contact a health care provider if: Your symptoms get worse or they do not improve with treatment. Summary Restless legs syndrome is a condition that causes uncomfortable feelings or sensations in the legs, especially while sitting or lying down. The symptoms often interfere with your ability to sleep. This condition is treated by managing the symptoms. You may need to make lifestyle changes or take medicines. This information is not intended to replace advice given to you by your health care provider. Make sure you discuss any questions you have with your health care provider. Document Revised: 04/11/2021 Document Reviewed: 04/11/2021 Elsevier Patient Education    2024 [employer]  

## 2024-01-15 NOTE — Progress Notes (Signed)
 Subjective:    Patient ID: Phyllis Walker, female    DOB: Dec 07, 1945, 78 y.o.   MRN: [id number]   Chief Complaint: annual physical    HPI:  Phyllis Walker is a 78 y.o. who identifies as a female who was assigned female at birth.   Social history: Lives with: husband- he has been taking treatments for cancer , so she has had a lot on her. Work history: retired   Water engineer in today for follow up of the following chronic medical issues:  1. Primary hypertension No c/o chest pain, sob or headache. Does not check blood pressure at home. BP Readings from Last 3 Encounters:  12/18/23 119/65  07/17/23 116/70  01/13/23 128/67    2. Hyperlipidemia with target LDL less than 100 Does not really watch diet and does no dedicated exercise. Lab Results  Component Value Date   CHOL 163 07/17/2023   HDL 43 07/17/2023   LDLCALC 99 07/17/2023   TRIG 113 07/17/2023   CHOLHDL 3.8 07/17/2023   The 10-year ASCVD risk score (Arnett DK, et al., 2019) is: 24.3%   3. Acquired hypothyroidism No issues that she is aware of. Lab Results  Component Value Date   TSH 1.480 07/17/2023     4. Recurrent major depressive disorder, in full remission (HC She has been on zoloft  for several years. Is doing ok.    12/18/2023    9:45 AM 07/17/2023    7:56 AM 07/12/2023   10:31 AM  Depression screen PHQ 2/9  Decreased Interest 0 0 0  Down, Depressed, Hopeless 1 0 0  PHQ - 2 Score 1 0 0  Altered sleeping  0   Tired, decreased energy  0   Change in appetite  0   Feeling bad or failure about yourself   0   Trouble concentrating  0   Moving slowly or fidgety/restless  0   Suicidal thoughts  0   PHQ-9 Score  0   Difficult doing work/chores  Not difficult at all     5. GAD (generalized anxiety disorder) Is on xanax  BID. Her stress level has neen higher since her hsband has been on caner treatments.    07/17/2023    7:56 AM 01/13/2023    8:31 AM 11/29/2022    1:59 PM 10/10/2022    7:59  AM  GAD 7 : Generalized Anxiety Score  Nervous, Anxious, on Edge 0 0 1 2  Control/stop worrying 0 0 0 2  Worry too much - different things 0 0 1 2  Trouble relaxing 0 0 0 1  Restless 0 0 0 1  Easily annoyed or irritable 0 0 0 1  Afraid - awful might happen 0 0 0 1  Total GAD 7 Score 0 0 2 10  Anxiety Difficulty Not difficult at all Not difficult at all  Somewhat difficult      6. Anemia, unspecified type Is on daily iron  supplement Lab Results  Component Value Date   HGB 11.4 07/17/2023     7. Primary insomnia Xanax  usually helps her to sleep at night  8. Neuropathy Is on lyrica  and still has lower ext pain at times  9. Restless leg syndrome Is on requip  which is working well for her  10. Osteoporosis with pathological fracture of ankle and foot, unspecified laterality, with routine healing, subsequent encounter No weight bearing exercises. Last dexascan was done on 01/24/23. T score was -3.3.  11. BMI 31.0-31.9,adult Weight is down 4lbs  Wt Readings from Last 3 Encounters:  12/18/23 143 lb (64.9 kg)  07/17/23 147 lb (66.7 kg)  07/12/23 145 lb (65.8 kg)   BMI Readings from Last 3 Encounters:  12/18/23 24.55 kg/m  07/17/23 25.23 kg/m  07/12/23 24.89 kg/m     New complaints: None today  Allergies  Allergen Reactions   Amoxicillin-Pot Clavulanate Nausea And Vomiting and Other (See Comments)    STOMACH CRAMPS  Has patient had a PCN reaction causing immediate rash, facial/tongue/throat swelling, SOB or lightheadedness with hypotension: No Has patient had a PCN reaction causing severe rash involving mucus membranes or skin necrosis: No Has patient had a PCN reaction that required hospitalization No Has patient had a PCN reaction occurring within the last 10 years: Yes If all of the above answers are "NO", then may proceed with Cephalosporin use.    Outpatient Encounter Medications as of 01/15/2024  Medication Sig   albuterol  (VENTOLIN  HFA) 108 (90 Base)  MCG/ACT inhaler Inhale 2 puffs into the lungs every 6 (six) hours as needed for wheezing or shortness of breath.   alendronate  (FOSAMAX ) 70 MG tablet Take 1 tablet (70 mg total) by mouth every 7 (seven) days. Take with a full glass of water on an empty stomach.   ALPRAZolam  (XANAX ) 0.25 MG tablet Take 1 tablet (0.25 mg total) by mouth 2 (two) times daily as needed for anxiety.   benazepril -hydrochlorthiazide (LOTENSIN  HCT) 20-12.5 MG tablet 1/2 tablet Orally Once a day   diclofenac  (VOLTAREN ) 75 MG EC tablet TAKE ONE TABLET TWICE DAILY   ezetimibe  (ZETIA ) 10 MG tablet Take 1 tablet (10 mg total) by mouth daily.   ferrous sulfate  (FEROSUL) 325 (65 FE) MG tablet Take 1 tablet (325 mg total) by mouth daily.   gabapentin  (NEURONTIN ) 100 MG capsule TAKE ONE CAPSULE THREE TIMES DAILY   letrozole  (FEMARA ) 2.5 MG tablet Take 1 tablet (2.5 mg total) by mouth daily.   levothyroxine  (SYNTHROID ) 75 MCG tablet Take 1 tablet (75 mcg total) by mouth daily.   pantoprazole  (PROTONIX ) 20 MG tablet 1 tablet Orally Once a day 30 minutes before breakfast for 90 days   pregabalin  (LYRICA ) 50 MG capsule 1 capsule Orally Twice a day   rOPINIRole  (REQUIP ) 0.25 MG tablet Take 1 tablet (0.25 mg total) by mouth 3 (three) times daily.   sertraline  (ZOLOFT ) 50 MG tablet Take 1 tablet (50 mg total) by mouth daily.   simvastatin  (ZOCOR ) 20 MG tablet TAKE ONE TABLET EVERY EVENING ONCE DAILY   sulfamethoxazole -trimethoprim  (BACTRIM  DS) 800-160 MG tablet Take 1 tablet by mouth 2 (two) times daily.   traZODone  (DESYREL ) 50 MG tablet TAKE ONE-HALF TO 1 TABLET AT BEDTIME AS NEEDED FOR SLEEP   Facility-Administered Encounter Medications as of 01/15/2024  Medication   cyanocobalamin  ((VITAMIN B-12)) injection 1,000 mcg   methylPREDNISolone  acetate (DEPO-MEDROL ) injection 80 mg    Past Surgical History:  Procedure Laterality Date   BREAST LUMPECTOMY  1987   benign fibroid tumor   BREAST LUMPECTOMY WITH RADIOACTIVE SEED AND  SENTINEL LYMPH NODE BIOPSY Right 07/05/2018   Procedure: RIGHT BREAST LUMPECTOMY WITH RADIOACTIVE SEED X 2 AND RIGHT AXILLARY SENTINEL LYMPH NODE BIOPSY;  Surgeon: Juanita Norlander, MD;  Location: [facility];  Service: General;  Laterality: Right;   BUNIONECTOMY Bilateral    CATARACT EXTRACTION     COLONOSCOPY     THYROIDECTOMY, PARTIAL  1969   TONSILLECTOMY     TOTAL KNEE ARTHROPLASTY Right 08/15/2016   Procedure: TOTAL KNEE ARTHROPLASTY;  Surgeon: Christie Cox, MD;  Location: [facility];  Service: Orthopedics;  Laterality: Right;  RNFA    Family History  Problem Relation Age of Onset   Cancer Mother        pancreas   Heart attack Father    Hypertension Sister    Hyperlipidemia Sister    Anuerysm Sister    Hypertension Sister    Hyperlipidemia Sister    Early death Sister 5       accident   Ovarian cancer Maternal Aunt    Breast cancer Neg Hx       Controlled substance contract: n/a     Review of Systems  Constitutional:  Negative for diaphoresis.  Eyes:  Negative for pain.  Respiratory:  Negative for shortness of breath.   Cardiovascular:  Negative for chest pain, palpitations and leg swelling.  Gastrointestinal:  Negative for abdominal pain.  Endocrine: Negative for polydipsia.  Skin:  Negative for rash.  Neurological:  Negative for dizziness, weakness and headaches.  Hematological:  Does not bruise/bleed easily.  All other systems reviewed and are negative.      Objective:   Physical Exam Vitals and nursing note reviewed.  Constitutional:      General: She is not in acute distress.    Appearance: Normal appearance. She is well-developed.  HENT:     Head: Normocephalic.     Right Ear: Tympanic membrane normal.     Left Ear: Tympanic membrane normal.     Nose: Nose normal.     Mouth/Throat:     Mouth: Mucous membranes are moist.  Eyes:     Pupils: Pupils are equal, round, and reactive to light.  Neck:     Vascular: No carotid bruit or JVD.   Cardiovascular:     Rate and Rhythm: Normal rate and regular rhythm.     Heart sounds: Normal heart sounds.  Pulmonary:     Effort: Pulmonary effort is normal. No respiratory distress.     Breath sounds: Normal breath sounds. No wheezing or rales.  Chest:     Chest wall: No tenderness.  Abdominal:     General: Bowel sounds are normal. There is no distension or abdominal bruit.     Palpations: Abdomen is soft. There is no hepatomegaly, splenomegaly, mass or pulsatile mass.     Tenderness: There is no abdominal tenderness.  Musculoskeletal:        General: Normal range of motion.     Cervical back: Normal range of motion and neck supple.  Lymphadenopathy:     Cervical: No cervical adenopathy.  Skin:    General: Skin is warm and dry.  Neurological:     Mental Status: She is alert and oriented to person, place, and time.     Deep Tendon Reflexes: Reflexes are normal and symmetric.  Psychiatric:        Behavior: Behavior normal.        Thought Content: Thought content normal.        Judgment: Judgment normal.     BP 112/66   Pulse 75   Temp (!) 97.5 F (36.4 C) (Temporal)   Ht 5\' 4"  (1.626 m)   Wt 142 lb (64.4 kg)   SpO2 98%   BMI 24.37 kg/m         Assessment & Plan:   Phyllis Walker comes in today with chief complaint of annual physical  Diagnosis and orders addressed:  1. Primary hypertension Low sodium diet - benazepril -hydrochlorthiazide (LOTENSIN  HCT)  20-12.5 MG tablet; 1/2 tablet Orally Once a day  Dispense: 90 tablet; Refill: 1 - CBC with Differential/Platelet - CMP14+EGFR  2. Hyperlipidemia with target LDL less than 100 Low fat diet - ezetimibe  (ZETIA ) 10 MG tablet; Take 1 tablet (10 mg total) by mouth daily.  Dispense: 90 tablet; Refill: 1 - simvastatin  (ZOCOR ) 20 MG tablet; TAKE ONE TABLET EVERY EVENING ONCE DAILY  Dispense: 90 tablet; Refill: 1 - Lipid panel  3. Acquired hypothyroidism Labs pending  - levothyroxine  (SYNTHROID ) 75 MCG  tablet; Take 1 tablet (75 mcg total) by mouth daily.  Dispense: 90 tablet; Refill: 1 - Thyroid  Panel With TSH  4. Recurrent major depressive disorder, in full remission (HCC) Stress management - sertraline  (ZOLOFT ) 50 MG tablet; Take 1 tablet (50 mg total) by mouth daily.  Dispense: 90 tablet; Refill: 1  5. GAD (generalized anxiety disorder) - ALPRAZolam  (XANAX ) 0.25 MG tablet; Take 1 tablet (0.25 mg total) by mouth 2 (two) times daily as needed for anxiety.  Dispense: 60 tablet; Refill: 5  6. Anemia, unspecified type Labs pending - ferrous sulfate  (FEROSUL) 325 (65 FE) MG tablet; Take 1 tablet (325 mg total) by mouth daily.  Dispense: 90 tablet; Refill: 1  7. Primary insomnia Bedtime routine - traZODone  (DESYREL ) 50 MG tablet; TAKE ONE-HALF TO 1 TABLET AT BEDTIME AS NEEDED FOR SLEEP  Dispense: 90 tablet; Refill: 1  8. Neuropathy Fdo not go barefooted - gabapentin  (NEURONTIN ) 100 MG capsule; TAKE ONE CAPSULE THREE TIMES DAILY  Dispense: 270 capsule; Refill: 1 - pregabalin  (LYRICA ) 50 MG capsule; 1 capsule Orally Twice a day  Dispense: 90 capsule; Refill: 1  9. Osteoporosis with pathological fracture of ankle and foot, unspecified laterality, with routine healing, subsequent encounter Weight bearing exercises - alendronate  (FOSAMAX ) 70 MG tablet; Take 1 tablet (70 mg total) by mouth every 7 (seven) days. Take with a full glass of water on an empty stomach.  Dispense: 4 tablet; Refill: 11  10. BMI 31.0-31.9,adult Discussed diet and exercise for person with BMI >25 Will recheck weight in 3-6 months   11. Restless leg syndrome Keep  legs warm at night - rOPINIRole  (REQUIP ) 0.25 MG tablet; Take 1 tablet (0.25 mg total) by mouth 3 (three) times daily.  Dispense: 270 tablet; Refill: 1   Labs pending Health Maintenance reviewed Diet and exercise encouraged  Follow up plan: 6 months   Mary-Margaret Gaylyn Keas, FNP

## 2024-01-18 LAB — TOXASSURE SELECT 13 (MW), URINE

## 2024-01-19 ENCOUNTER — Telehealth: Payer: Self-pay | Admitting: Nurse Practitioner

## 2024-01-19 NOTE — Telephone Encounter (Signed)
 Our office has not received any fax from insurance and I spoke with patient regarding this because we cannot send any records to anyone without the patients approval, and the patient has not spoken with anyone from insurance about this.    Copied from CRM [id number]. Topic: Medical Record Request - Provider/Facility Request >> Jan 19, 2024 11:07 AM Felicitas Horse wrote: Reason for CRM: Lavonia Powers called in to verify pt was a pt of Carolann Chum and also to confirm fax number   Fax request will be sent over of pt  last 5 visit records.   [phone] - callback

## 2024-01-31 ENCOUNTER — Other Ambulatory Visit: Payer: Self-pay | Admitting: Nurse Practitioner

## 2024-02-07 ENCOUNTER — Other Ambulatory Visit: Payer: Self-pay | Admitting: *Deleted

## 2024-02-07 MED ORDER — DICLOFENAC SODIUM 75 MG PO TBEC
75.0000 mg | DELAYED_RELEASE_TABLET | Freq: Two times a day (BID) | ORAL | 0 refills | Status: DC
Start: 2024-02-07 — End: 2024-03-19

## 2024-02-07 NOTE — Telephone Encounter (Signed)
 Pt came in today, was seen the other week and did not get a refill on her Diclofenac  Refill sent to [facility]

## 2024-02-13 ENCOUNTER — Ambulatory Visit (INDEPENDENT_AMBULATORY_CARE_PROVIDER_SITE_OTHER)

## 2024-02-13 DIAGNOSIS — E538 Deficiency of other specified B group vitamins: Secondary | ICD-10-CM

## 2024-02-13 NOTE — Progress Notes (Signed)
 Patient is in office today for a nurse visit for B12 Injection. Patient injection was given in the left deltoid. Patient tolerated injection well.

## 2024-03-19 ENCOUNTER — Ambulatory Visit (INDEPENDENT_AMBULATORY_CARE_PROVIDER_SITE_OTHER)

## 2024-03-19 ENCOUNTER — Telehealth: Payer: Self-pay

## 2024-03-19 DIAGNOSIS — D649 Anemia, unspecified: Secondary | ICD-10-CM

## 2024-03-19 DIAGNOSIS — E538 Deficiency of other specified B group vitamins: Secondary | ICD-10-CM | POA: Diagnosis not present

## 2024-03-19 MED ORDER — DICLOFENAC SODIUM 75 MG PO TBEC: 75.0000 mg | DELAYED_RELEASE_TABLET | Freq: Two times a day (BID) | ORAL | 0 refills | Status: DC

## 2024-03-19 NOTE — Telephone Encounter (Signed)
 Spoke to pharmacy and confirmed refills are on file.  I informed the patient that she can refill Xanax  on 07/14, Ferusol  on 07/27. And she can pick up her diclofenac  now.

## 2024-03-19 NOTE — Telephone Encounter (Signed)
 Patient is in office today for her B12 injection, patient would like to know if refills can be sent in for Xanax , Diclofenac , and Ferosul. Patient was last seen in April, next appointment isn't until November, please advise.

## 2024-03-19 NOTE — Telephone Encounter (Signed)
 Refilled diclofenac  Patient had refills on xanax  and ferusol already

## 2024-03-19 NOTE — Progress Notes (Signed)
 Patient is in office today for a nurse visit for B12 Injection. Patient Injection was given in the  Left deltoid. Patient tolerated injection well.

## 2024-03-19 NOTE — Addendum Note (Signed)
 Addended by: GLADIS MUSTARD on: 03/19/2024 12:59 PM   Modules accepted: Orders

## 2024-04-23 ENCOUNTER — Other Ambulatory Visit: Payer: Self-pay | Admitting: Nurse Practitioner

## 2024-04-23 ENCOUNTER — Ambulatory Visit (INDEPENDENT_AMBULATORY_CARE_PROVIDER_SITE_OTHER): Admitting: *Deleted

## 2024-04-23 DIAGNOSIS — E785 Hyperlipidemia, unspecified: Secondary | ICD-10-CM

## 2024-04-23 DIAGNOSIS — E538 Deficiency of other specified B group vitamins: Secondary | ICD-10-CM

## 2024-04-23 MED ORDER — SIMVASTATIN 20 MG PO TABS: ORAL_TABLET | ORAL | 1 refills | Status: DC

## 2024-04-23 NOTE — Progress Notes (Signed)
 Patient is in office today for a nurse visit for B12 Injection. Patient Injection was given in the  Left deltoid. Patient tolerated injection well.

## 2024-05-24 ENCOUNTER — Ambulatory Visit (INDEPENDENT_AMBULATORY_CARE_PROVIDER_SITE_OTHER)

## 2024-05-24 DIAGNOSIS — E538 Deficiency of other specified B group vitamins: Secondary | ICD-10-CM | POA: Diagnosis not present

## 2024-05-24 NOTE — Progress Notes (Signed)
 Patient is in office today for a nurse visit for B12 Injection. Patient Injection was given in the  Left deltoid. Patient tolerated injection well.

## 2024-05-25 LAB — VITAMIN B12: Vitamin B-12: 553 pg/mL (ref 232–1245)

## 2024-05-27 ENCOUNTER — Ambulatory Visit: Payer: Self-pay | Admitting: Nurse Practitioner

## 2024-06-17 ENCOUNTER — Encounter: Payer: Self-pay | Admitting: Family

## 2024-06-17 ENCOUNTER — Ambulatory Visit: Payer: Self-pay | Admitting: Nurse Practitioner

## 2024-06-17 ENCOUNTER — Ambulatory Visit: Admitting: Family

## 2024-06-17 ENCOUNTER — Ambulatory Visit: Payer: Self-pay | Admitting: Family

## 2024-06-17 VITALS — BP 99/63 | HR 94 | Temp 97.4°F | Ht 64.0 in | Wt 146.0 lb

## 2024-06-17 DIAGNOSIS — R300 Dysuria: Secondary | ICD-10-CM

## 2024-06-17 DIAGNOSIS — R399 Unspecified symptoms and signs involving the genitourinary system: Secondary | ICD-10-CM | POA: Diagnosis not present

## 2024-06-17 LAB — URINALYSIS, COMPLETE
Bilirubin, UA: NEGATIVE
Glucose, UA: NEGATIVE
Nitrite, UA: POSITIVE — AB
Specific Gravity, UA: 1.015 (ref 1.005–1.030)
Urobilinogen, Ur: 1 mg/dL (ref 0.2–1.0)
pH, UA: 5.5 (ref 5.0–7.5)

## 2024-06-17 MED ORDER — CEPHALEXIN 500 MG PO CAPS: 500.0000 mg | ORAL_CAPSULE | Freq: Two times a day (BID) | ORAL | 0 refills | Status: DC

## 2024-06-17 NOTE — Progress Notes (Signed)
 Subjective:    Patient ID: Phyllis Walker, female    DOB: [birth year], 78 y.o.   MRN: [id number]  Chief Complaint  Patient presents with   pelvic pressure    urine urgency     Urinary Frequency  This is a new problem. The current episode started yesterday. The problem occurs every urination. The problem has been gradually worsening. Quality: pressure in lower abdominal. The pain is at a severity of 3/10. The pain is mild. There has been no fever. Associated symptoms include frequency and urgency. Pertinent negatives include no flank pain, hematuria, hesitancy, nausea or vomiting. She has tried increased fluids for the symptoms. The treatment provided mild relief.      Review of Systems  Gastrointestinal:  Negative for nausea and vomiting.  Genitourinary:  Positive for frequency and urgency. Negative for flank pain, hematuria and hesitancy.  All other systems reviewed and are negative.   Social History   Socioeconomic History   Marital status: Married    Spouse name: Not on file   Number of children: 2   Years of education: 12   Highest education level: 12th grade  Occupational History   Occupation: retired  Tobacco Use   Smoking status: Former    Current packs/day: 0.00    Average packs/day: 2.0 packs/day for 40.0 years (80.0 ttl pk-yrs)    Types: Cigarettes    Start date: 12/21/1966    Quit date: 12/21/2006    Years since quitting: 17.5   Smokeless tobacco: Never  Vaping Use   Vaping status: Never Used  Substance and Sexual Activity   Alcohol use: No   Drug use: No   Sexual activity: Not Currently  Other Topics Concern   Not on file  Social History Narrative   Lives with her husband has one cat   One child in [place] and one in [place]   Social Drivers of Health   Financial Resource Strain: Low Risk  (07/12/2023)   Overall Financial Resource Strain (CARDIA)    Difficulty of Paying Living Expenses: Not hard at all  Food Insecurity: No Food Insecurity  (07/12/2023)   Hunger Vital Sign    Worried About Running Out of Food in the Last Year: Never true    Ran Out of Food in the Last Year: Never true  Transportation Needs: No Transportation Needs (07/12/2023)   PRAPARE - Administrator, Civil Service (Medical): No    Lack of Transportation (Non-Medical): No  Physical Activity: Insufficiently Active (07/12/2023)   Exercise Vital Sign    Days of Exercise per Week: 3 days    Minutes of Exercise per Session: 30 min  Stress: No Stress Concern Present (07/12/2023)   [employer] of Occupational Health - Occupational Stress Questionnaire    Feeling of Stress : Not at all  Social Connections: Moderately Integrated (07/12/2023)   Social Connection and Isolation Panel    Frequency of Communication with Friends and Family: More than three times a week    Frequency of Social Gatherings with Friends and Family: More than three times a week    Attends Religious Services: More than 4 times per year    Active Member of [employer] or Organizations: No    Attends Banker Meetings: Never    Marital Status: Married   Family History  Problem Relation Age of Onset   Cancer Mother        pancreas   Heart attack Father    Hypertension Sister  Hyperlipidemia Sister    Anuerysm Sister    Hypertension Sister    Hyperlipidemia Sister    Early death Sister 5       accident   Ovarian cancer Maternal Aunt    Breast cancer Neg Hx         Objective:   Physical Exam Vitals reviewed.  Constitutional:      General: She is not in acute distress.    Appearance: She is well-developed.  HENT:     Head: Normocephalic and atraumatic.  Eyes:     Pupils: Pupils are equal, round, and reactive to light.  Neck:     Thyroid : No thyromegaly.  Cardiovascular:     Rate and Rhythm: Normal rate and regular rhythm.     Heart sounds: Normal heart sounds. No murmur heard. Pulmonary:     Effort: Pulmonary effort is normal. No respiratory  distress.     Breath sounds: Normal breath sounds. No wheezing.  Abdominal:     General: Bowel sounds are normal. There is no distension.     Palpations: Abdomen is soft.     Tenderness: There is no abdominal tenderness.  Musculoskeletal:        General: No tenderness. Normal range of motion.     Cervical back: Normal range of motion and neck supple.  Skin:    General: Skin is warm and dry.  Neurological:     Mental Status: She is alert and oriented to person, place, and time.     Cranial Nerves: No cranial nerve deficit.     Deep Tendon Reflexes: Reflexes are normal and symmetric.  Psychiatric:        Behavior: Behavior normal.        Thought Content: Thought content normal.        Judgment: Judgment normal.       BP 99/63   Pulse 94   Temp (!) 97.4 F (36.3 C) (Temporal)   Ht 5' 4 (1.626 m)   Wt 146 lb (66.2 kg)   BMI 25.06 kg/m      Assessment & Plan:  Phyllis Walker comes in today with chief complaint of pelvic pressure  and urine urgency    Diagnosis and orders addressed:  1. Dysuria (Primary) - Urinalysis, Complete - Urine Culture  2. UTI symptoms Force fluids AZO over the counter X2 days RTO prn Culture pending Follow up if symptoms worsen or do not improve  - cephALEXin  (KEFLEX ) 500 MG capsule; Take 1 capsule (500 mg total) by mouth 2 (two) times daily.  Dispense: 14 capsule; Refill: 0    Bari Learn, FNP

## 2024-06-17 NOTE — Telephone Encounter (Signed)
 APPT MADE

## 2024-06-17 NOTE — Telephone Encounter (Signed)
 FYI Only or Action Required?: FYI only for provider.  Patient was last seen in primary care on 01/15/2024 by Gladis Mustard, FNP.  Called Nurse Triage reporting Urinary Tract Infection.  Symptoms began several days ago.   Triage Disposition: See Physician Within 24 Hours  Patient/caregiver understands and will follow disposition?: Yes             Copied from CRM #[id number]. Topic: Clinical - Red Word Triage >> Jun 17, 2024  8:23 AM Miquel SAILOR wrote: Red Word that prompted transfer to Nurse Triage: bladdar or kidney infection since 10/04 wokeup during night when sleeping due to pressure 4 times Reason for Disposition  Urinating more frequently than usual (i.e., frequency) OR new-onset of the feeling of an urgent need to urinate (i.e., urgency)  Answer Assessment - Initial Assessment Questions This RN scheduled pt for an appt in office today with a different provider as PCP not available today.    SYMPTOM: What's the main symptom you're concerned about? (e.g., frequency, incontinence)     Urinary frequency, urinary pressure throughout night waking pt up  ONSET: When did the  symptoms  start?     Two days ago  CAUSE: What do you think is causing the symptoms?     UTI  Denies burning with urination, fever, flank pain, blood in urine  Protocols used: Urinary Symptoms-A-AH

## 2024-06-17 NOTE — Patient Instructions (Signed)

## 2024-06-20 LAB — URINE CULTURE

## 2024-06-24 ENCOUNTER — Ambulatory Visit (INDEPENDENT_AMBULATORY_CARE_PROVIDER_SITE_OTHER): Admitting: *Deleted

## 2024-06-24 DIAGNOSIS — E538 Deficiency of other specified B group vitamins: Secondary | ICD-10-CM

## 2024-06-24 NOTE — Progress Notes (Signed)
 Patient is in office today for a nurse visit for B12 Injection. Patient Injection was given in the  Left deltoid. Patient tolerated injection well.

## 2024-07-01 ENCOUNTER — Ambulatory Visit (INDEPENDENT_AMBULATORY_CARE_PROVIDER_SITE_OTHER): Admitting: Nurse Practitioner

## 2024-07-01 ENCOUNTER — Encounter: Payer: Self-pay | Admitting: Nurse Practitioner

## 2024-07-01 VITALS — BP 112/70 | HR 98 | Temp 97.1°F | Ht 64.0 in | Wt 143.0 lb

## 2024-07-01 DIAGNOSIS — N3000 Acute cystitis without hematuria: Secondary | ICD-10-CM | POA: Diagnosis not present

## 2024-07-01 DIAGNOSIS — R300 Dysuria: Secondary | ICD-10-CM | POA: Diagnosis not present

## 2024-07-01 LAB — URINALYSIS, ROUTINE W REFLEX MICROSCOPIC
Bilirubin, UA: NEGATIVE
Nitrite, UA: POSITIVE — AB
Specific Gravity, UA: 1.01 (ref 1.005–1.030)
Urobilinogen, Ur: 4 mg/dL — ABNORMAL HIGH (ref 0.2–1.0)
pH, UA: 5.5 (ref 5.0–7.5)

## 2024-07-01 LAB — MICROSCOPIC EXAMINATION
Renal Epithel, UA: NONE SEEN /HPF
WBC, UA: >30 /HPF — AB (ref 0–5)
Yeast, UA: NONE SEEN

## 2024-07-01 MED ORDER — CIPROFLOXACIN HCL 500 MG PO TABS
500.0000 mg | ORAL_TABLET | Freq: Two times a day (BID) | ORAL | 0 refills | Status: DC
Start: 2024-07-01 — End: 2024-07-16

## 2024-07-01 MED ORDER — DICLOFENAC SODIUM 75 MG PO TBEC: 75.0000 mg | DELAYED_RELEASE_TABLET | Freq: Two times a day (BID) | ORAL | 0 refills | Status: DC

## 2024-07-01 NOTE — Patient Instructions (Signed)
 Take medication as prescribe Cotton underwear Take shower not bath Cranberry juice, yogurt Force fluids AZO over the counter X2 days Culture pending RTO prn

## 2024-07-01 NOTE — Progress Notes (Signed)
   Subjective:    Patient ID: Phyllis Walker, female    DOB: 1946/08/14, 78 y.o.   MRN: [id number]   Chief Complaint: Dysuria (Seen 2 weeks ago and was given Keflex . Still no better)   Dysuria  Associated symptoms include frequency and urgency.    Patient was seen in office 2 weeks ago with UTI. Was given keflex . She says she is still having burning and frequency. Patient Active Problem List   Diagnosis Date Noted   Malignant neoplasm of upper-inner quadrant of right breast in female, estrogen receptor positive (HCC) 05/31/2018   Chronic right shoulder pain 04/11/2017   S/P total knee replacement 08/15/2016   BMI 31.0-31.9,adult 06/19/2015   Anemia 05/27/2015   Insomnia 03/09/2015   Osteoporosis with pathological fracture of ankle and foot 05/22/2014   Hypothyroidism 05/12/2014   Hyperlipidemia with target LDL less than 100 05/12/2014   Restless leg syndrome 02/21/2014   Neuropathy 02/21/2014   Unilateral primary osteoarthritis, right knee 05/23/2013   GAD (generalized anxiety disorder) 12/21/2012   Primary hypertension 12/21/2012   Depression 12/21/2012       Review of Systems  Genitourinary:  Positive for dysuria, frequency and urgency.       Objective:   Physical Exam Constitutional:      Appearance: Normal appearance.  Cardiovascular:     Rate and Rhythm: Normal rate and regular rhythm.     Heart sounds: Normal heart sounds.  Pulmonary:     Breath sounds: Normal breath sounds.  Neurological:     General: No focal deficit present.     Mental Status: She is alert and oriented to person, place, and time.  Psychiatric:        Mood and Affect: Mood normal.        Behavior: Behavior normal.    BP 112/70   Pulse 98   Temp (!) 97.1 F (36.2 C) (Temporal)   Ht 5' 4 (1.626 m)   Wt 143 lb (64.9 kg)   SpO2 95%   BMI 24.55 kg/m         Assessment & Plan:   Phyllis Shermon Kimmel in today with chief complaint of Dysuria (Seen 2 weeks ago and was  given Keflex . Still no better)   1. Dysuria (Primary)  - Urinalysis, Routine w reflex microscopic - Urine Culture  2. Acute cystitis without hematuria Take medication as prescribe Cotton underwear Take shower not bath Cranberry juice, yogurt Force fluids AZO over the counter X2 days Culture pending RTO prn Meds ordered this encounter  Medications   ciprofloxacin  (CIPRO ) 500 MG tablet    Sig: Take 1 tablet (500 mg total) by mouth 2 (two) times daily.    Dispense:  10 tablet    Refill:  0    Supervising Provider:   MARYANNE CHEW A [[id number]]       The above assessment and management plan was discussed with the patient. The patient verbalized understanding of and has agreed to the management plan. Patient is aware to call the clinic if symptoms persist or worsen. Patient is aware when to return to the clinic for a follow-up visit. Patient educated on when it is appropriate to go to the emergency department.   Phyllis Gladis, FNP

## 2024-07-02 ENCOUNTER — Ambulatory Visit: Payer: Self-pay | Admitting: Nurse Practitioner

## 2024-07-04 LAB — URINE CULTURE

## 2024-07-12 ENCOUNTER — Ambulatory Visit: Payer: Medicare HMO

## 2024-07-12 VITALS — BP 112/70 | HR 98 | Ht 64.0 in | Wt 143.0 lb

## 2024-07-12 DIAGNOSIS — Z0000 Encounter for general adult medical examination without abnormal findings: Secondary | ICD-10-CM

## 2024-07-12 NOTE — Progress Notes (Signed)
 Subjective:   Phyllis Walker is a 78 y.o. who presents for a Medicare Wellness preventive visit.  As a reminder, Annual Wellness Visits don't include a physical exam, and some assessments may be limited, especially if this visit is performed virtually. We may recommend an in-person follow-up visit with your provider if needed.  Visit Complete: Virtual I connected with  Phyllis Walker on 07/12/24 by a audio enabled telemedicine application and verified that I am speaking with the correct person using two identifiers.  Patient Location: Home  Provider Location: Home Office  I discussed the limitations of evaluation and management by telemedicine. The patient expressed understanding and agreed to proceed.  Vital Signs: Because this visit was a virtual/telehealth visit, some criteria may be missing or patient reported. Any vitals not documented were not able to be obtained and vitals that have been documented are patient reported.  VideoDeclined- This patient declined Librarian, academic. Therefore the visit was completed with audio only.  Persons Participating in Visit: Patient.  AWV Questionnaire: No: Patient Medicare AWV questionnaire was not completed prior to this visit.  Cardiac Risk Factors include: advanced age (>30men, >15 women);dyslipidemia;hypertension;smoking/ tobacco exposure     Objective:    Today's Vitals   07/12/24 0851  BP: 112/70  Pulse: 98  Weight: 143 lb (64.9 kg)  Height: 5' 4 (1.626 m)   Body mass index is 24.55 kg/m.     07/12/2024    8:48 AM 07/12/2023   10:32 AM 07/07/2022   11:57 AM 06/17/2021   10:40 AM 03/26/2019    9:53 AM 07/05/2018    6:45 AM 07/02/2018    9:48 AM  Advanced Directives  Does Patient Have a Medical Advance Directive? No No No Yes No No  No   Type of Theme Park Manager;Living will     Copy of Healthcare Power of Attorney in Chart?    No - copy requested      Would patient like information on creating a medical advance directive?  Yes (MAU/Ambulatory/Procedural Areas - Information given) No - Patient declined  No - Patient declined  No - Patient declined  No - Patient declined      Data saved with a previous flowsheet row definition    Current Medications (verified) Outpatient Encounter Medications as of 07/12/2024  Medication Sig   albuterol  (VENTOLIN  HFA) 108 (90 Base) MCG/ACT inhaler Inhale 2 puffs into the lungs every 6 (six) hours as needed for wheezing or shortness of breath.   alendronate  (FOSAMAX ) 70 MG tablet Take 1 tablet (70 mg total) by mouth every 7 (seven) days. Take with a full glass of water on an empty stomach.   ALPRAZolam  (XANAX ) 0.25 MG tablet Take 1 tablet (0.25 mg total) by mouth 2 (two) times daily as needed for anxiety.   benazepril -hydrochlorthiazide (LOTENSIN  HCT) 20-12.5 MG tablet 1/2 tablet Orally Once a day   cephALEXin  (KEFLEX ) 500 MG capsule Take 1 capsule (500 mg total) by mouth 2 (two) times daily.   diclofenac  (VOLTAREN ) 75 MG EC tablet Take 1 tablet (75 mg total) by mouth 2 (two) times daily.   ezetimibe  (ZETIA ) 10 MG tablet Take 1 tablet (10 mg total) by mouth daily.   ferrous sulfate  (FEROSUL) 325 (65 FE) MG tablet Take 1 tablet (325 mg total) by mouth daily.   gabapentin  (NEURONTIN ) 100 MG capsule TAKE ONE CAPSULE THREE TIMES DAILY   letrozole  (FEMARA ) 2.5 MG tablet Take 1 tablet (  2.5 mg total) by mouth daily.   levothyroxine  (SYNTHROID ) 75 MCG tablet Take 1 tablet (75 mcg total) by mouth daily.   pantoprazole  (PROTONIX ) 20 MG tablet 1 tablet Orally Once a day 30 minutes before breakfast for 90 days   rOPINIRole  (REQUIP ) 0.25 MG tablet Take 1 tablet (0.25 mg total) by mouth 3 (three) times daily.   sertraline  (ZOLOFT ) 50 MG tablet Take 1 tablet (50 mg total) by mouth daily.   simvastatin  (ZOCOR ) 20 MG tablet TAKE ONE TABLET EVERY EVENING ONCE DAILY   traZODone  (DESYREL ) 50 MG tablet TAKE ONE-HALF TO 1  TABLET AT BEDTIME AS NEEDED FOR SLEEP   ciprofloxacin  (CIPRO ) 500 MG tablet Take 1 tablet (500 mg total) by mouth 2 (two) times daily. (Patient not taking: Reported on 07/12/2024)   Facility-Administered Encounter Medications as of 07/12/2024  Medication   cyanocobalamin  ((VITAMIN B-12)) injection 1,000 mcg   methylPREDNISolone  acetate (DEPO-MEDROL ) injection 80 mg    Allergies (verified) Amoxicillin-pot clavulanate   History: Past Medical History:  Diagnosis Date   Anemia    Anxiety    Arthritis    Breast cancer, right breast (HCC) 06/2018   Cataract    Depression    Fibroid tumor 1985   in breast   History of hiatal hernia    Hypercholesteremia    Hypertension Low dose   Hypothyroidism    Neuropathy    in feet   Restless leg syndrome    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   Past Surgical History:  Procedure Laterality Date   BREAST LUMPECTOMY  1987   benign fibroid tumor   BREAST LUMPECTOMY WITH RADIOACTIVE SEED AND SENTINEL LYMPH NODE BIOPSY Right 07/05/2018   Procedure: RIGHT BREAST LUMPECTOMY WITH RADIOACTIVE SEED X 2 AND RIGHT AXILLARY SENTINEL LYMPH NODE BIOPSY;  Surgeon: Ethyl Lenis, MD;  Location: [facility];  Service: General;  Laterality: Right;   BUNIONECTOMY Bilateral    CATARACT EXTRACTION     COLONOSCOPY     THYROIDECTOMY, PARTIAL  1969   TONSILLECTOMY     TOTAL KNEE ARTHROPLASTY Right 08/15/2016   Procedure: TOTAL KNEE ARTHROPLASTY;  Surgeon: Marcey Raman, MD;  Location: [facility];  Service: Orthopedics;  Laterality: Right;  RNFA   Family History  Problem Relation Age of Onset   Cancer Mother        pancreas   Heart attack Father        Died age 65   Hypertension Sister    Hyperlipidemia Sister    Anuerysm Sister    Hypertension Sister    Hyperlipidemia Sister    Early death Sister 5       accident   Ovarian cancer Maternal Aunt    Breast cancer Neg Hx    Social History   Socioeconomic History   Marital status:  Married    Spouse name: Not on file   Number of children: 2   Years of education: 12   Highest education level: 12th grade  Occupational History   Occupation: retired  Tobacco Use   Smoking status: Former    Current packs/day: 0.00    Average packs/day: 2.0 packs/day for 40.0 years (80.0 ttl pk-yrs)    Types: Cigarettes    Start date: 12/21/1966    Quit date: 12/21/2006    Years since quitting: 17.5   Smokeless tobacco: Never  Vaping Use   Vaping status: Never Used  Substance and Sexual Activity   Alcohol use: No   Drug use: No  Sexual activity: Not Currently  Other Topics Concern   Not on file  Social History Narrative   Lives with her husband has one cat   One child in [place] and one in [place]   Social Drivers of Health   Financial Resource Strain: Low Risk  (07/12/2024)   Overall Financial Resource Strain (CARDIA)    Difficulty of Paying Living Expenses: Not hard at all  Food Insecurity: No Food Insecurity (07/12/2024)   Hunger Vital Sign    Worried About Running Out of Food in the Last Year: Never true    Ran Out of Food in the Last Year: Never true  Transportation Needs: No Transportation Needs (07/12/2024)   PRAPARE - Administrator, Civil Service (Medical): No    Lack of Transportation (Non-Medical): No  Physical Activity: Insufficiently Active (07/12/2024)   Exercise Vital Sign    Days of Exercise per Week: 3 days    Minutes of Exercise per Session: 30 min  Stress: No Stress Concern Present (07/12/2024)   [employer] of Occupational Health - Occupational Stress Questionnaire    Feeling of Stress: Not at all  Social Connections: Moderately Integrated (07/12/2024)   Social Connection and Isolation Panel    Frequency of Communication with Friends and Family: More than three times a week    Frequency of Social Gatherings with Friends and Family: More than three times a week    Attends Religious Services: More than 4 times per year    Active  Member of [employer] or Organizations: No    Attends Engineer, Structural: Never    Marital Status: Married    Tobacco Counseling Counseling given: Yes    Clinical Intake:  Pre-visit preparation completed: Yes  Pain : No/denies pain     BMI - recorded: 24.55 Nutritional Risks: None Diabetes: No  Lab Results  Component Value Date   HGBA1C 5.0 10/07/2020     How often do you need to have someone help you when you read instructions, pamphlets, or other written materials from your doctor or pharmacy?: 1 - Never  Interpreter Needed?: No  Information entered by :: alia t/cma   Activities of Daily Living     07/12/2024    8:47 AM  In your present state of health, do you have any difficulty performing the following activities:  Hearing? 0  Vision? 0  Difficulty concentrating or making decisions? 0  Walking or climbing stairs? 0  Dressing or bathing? 0  Doing errands, shopping? 0  Preparing Food and eating ? N  Using the Toilet? N  In the past six months, have you accidently leaked urine? N  Do you have problems with loss of bowel control? N  Managing your Medications? N  Managing your Finances? N  Housekeeping or managing your Housekeeping? N    Patient Care Team: Gladis Mustard, FNP as PCP - General (Family Medicine) Anner Alm ORN, MD as PCP - Cardiology (Cardiology) Rubie Kemps, MD as Consulting Physician (Orthopedic Surgery) Neysa Inocente PARAS, NP (Inactive) as Nurse Practitioner (Obstetrics and Gynecology) Odean Potts, MD as Consulting Physician (Hematology and Oncology) Ethyl Alm, MD as Consulting Physician (General Surgery) Dewey Rush, MD as Consulting Physician (Radiation Oncology) Billee Mliss BIRCH, [facility] as Pharmacist (Family Medicine)  I have updated your Care Teams any recent Medical Services you may have received from other providers in the past year.     Assessment:   This is a routine wellness examination for  Jadaya.  Hearing/Vision screen  Hearing Screening - Comments:: Pt denies hearing Vision Screening - Comments:: Pt denies vision dif/pt has not been to eye dr in years.   Goals Addressed             This Visit's Progress    Patient Stated   On track    07/07/2022 AWV Goal: Fall Prevention  Over the next year, patient will decrease their risk for falls by: Using assistive devices, such as a cane or walker, as needed Identifying fall risks within their home and correcting them by: Removing throw rugs Adding handrails to stairs or ramps Removing clutter and keeping a clear pathway throughout the home Increasing light, especially at night Adding shower handles/bars Raising toilet seat Identifying potential personal risk factors for falls: Medication side effects Incontinence/urgency Vestibular dysfunction Hearing loss Musculoskeletal disorders Neurological disorders Orthostatic hypotension         Depression Screen     07/12/2024    8:48 AM 06/17/2024    1:35 PM 01/15/2024    9:39 AM 12/18/2023    9:45 AM 07/17/2023    7:56 AM 07/12/2023   10:31 AM 01/13/2023    8:30 AM  PHQ 2/9 Scores  PHQ - 2 Score 0 2 2 1  0 0 0  PHQ- 9 Score 0 5 7  0  0    Fall Risk     07/12/2024    8:46 AM 06/17/2024    1:34 PM 01/15/2024    9:39 AM 12/18/2023    9:45 AM 07/17/2023    7:56 AM  Fall Risk   Falls in the past year? 0 0 0 0 0  Number falls in past yr: 0 0     Injury with Fall? 0 0     Risk for fall due to : No Fall Risks No Fall Risks     Follow up Falls evaluation completed;Education provided Falls evaluation completed       MEDICARE RISK AT HOME:  Medicare Risk at Home Any stairs in or around the home?: No If so, are there any without handrails?: No Home free of loose throw rugs in walkways, pet beds, electrical cords, etc?: Yes Adequate lighting in your home to reduce risk of falls?: Yes Life alert?: No Use of a cane, walker or w/c?: No Grab bars in the bathroom?:  Yes Shower chair or bench in shower?: Yes Elevated toilet seat or a handicapped toilet?: Yes  TIMED UP AND GO:  Was the test performed?  no  Cognitive Function: 6CIT completed    01/02/2018   11:09 AM 12/29/2016   11:02 AM 05/27/2015    1:13 PM  MMSE - Mini Mental State Exam  Orientation to time 5 5  5    Orientation to Place 5 5  5    Registration 3 3  3    Attention/ Calculation 5 5  5    Recall 3 3  3    Language- name 2 objects 2 2  2    Language- repeat 1 1 1   Language- follow 3 step command 3 3  3    Language- read & follow direction 1 1  1    Write a sentence 1 1  1    Copy design 1 1  1    Total score 30 30  30       Data saved with a previous flowsheet row definition        07/12/2024    8:51 AM 07/12/2023   10:32 AM 07/07/2022   11:58 AM 03/26/2019  9:55 AM  6CIT Screen  What Year? 0 points 0 points 0 points 0 points  What month? 0 points 0 points 0 points 0 points  What time? 0 points 0 points 0 points 0 points  Count back from 20 0 points 0 points 0 points 0 points  Months in reverse 0 points 0 points 0 points 0 points  Repeat phrase 0 points 0 points 0 points 0 points  Total Score 0 points 0 points 0 points 0 points    Immunizations Immunization History  Administered Date(s) Administered   Fluad Quad(high Dose 65+) 06/03/2019, 06/09/2020, 06/07/2021, 06/27/2022   Fluad Trivalent(High Dose 65+) 07/17/2023   Influenza Split 06/18/2013   Influenza,inj,Quad PF,6+ Mos 06/09/2014, 06/19/2015, 06/10/2017, 07/12/2018   Influenza-Unspecified 06/20/2016, 05/29/2024   Moderna Covid-19 Vaccine Bivalent Booster 57yrs & up 06/29/2021   Moderna Sars-Covid-2 Vaccination 10/08/2019, 11/05/2019, 08/11/2020   PNEUMOCOCCAL CONJUGATE-20 04/13/2023   Pneumococcal Conjugate-13 11/20/2014   Pneumococcal Polysaccharide-23 09/12/2010   Respiratory Syncytial Virus Vaccine,Recomb Aduvanted(Arexvy) 09/20/2022   Tdap 12/29/2016   Zoster Recombinant(Shingrix) 04/08/2021, 10/11/2021    Zoster, Live 05/24/2013    Screening Tests Health Maintenance  Topic Date Due   COVID-19 Vaccine (5 - 2025-26 season) 07/13/2025 (Originally 05/13/2024)   Mammogram  12/24/2024   DEXA SCAN  01/23/2025   Medicare Annual Wellness (AWV)  07/12/2025   DTaP/Tdap/Td (2 - Td or Tdap) 12/30/2026   Colonoscopy  07/29/2027   Pneumococcal Vaccine: 50+ Years  Completed   Influenza Vaccine  Completed   Hepatitis C Screening  Completed   Zoster Vaccines- Shingrix  Completed   Meningococcal B Vaccine  Aged Out    Health Maintenance Items Addressed: See Nurse Notes at the end of this note  Additional Screening:  Vision Screening: Recommended annual ophthalmology exams for early detection of glaucoma and other disorders of the eye. Is the patient up to date with their annual eye exam?  No  Who is the provider or what is the name of the office in which the patient attends annual eye exams? none  Dental Screening: Recommended annual dental exams for proper oral hygiene  Community Resource Referral / Chronic Care Management: CRR required this visit?  No   CCM required this visit?  No   Plan:    I have personally reviewed and noted the following in the patient's chart:   Medical and social history Use of alcohol, tobacco or illicit drugs  Current medications and supplements including opioid prescriptions. Patient is not currently taking opioid prescriptions. Functional ability and status Nutritional status Physical activity Advanced directives List of other physicians Hospitalizations, surgeries, and ER visits in previous 12 months Vitals Screenings to include cognitive, depression, and falls Referrals and appointments  In addition, I have reviewed and discussed with patient certain preventive protocols, quality metrics, and best practice recommendations. A written personalized care plan for preventive services as well as general preventive health recommendations were provided to  patient.   Ozie Ned, CMA   07/12/2024   After Visit Summary: (MyChart) Due to this being a telephonic visit, the after visit summary with patients personalized plan was offered to patient via MyChart   Notes: Nothing significant to report at this time.

## 2024-07-12 NOTE — Patient Instructions (Signed)
 Phyllis Walker,  Thank you for taking the time for your Medicare Wellness Visit. I appreciate your continued commitment to your health goals. Please review the care plan we discussed, and feel free to reach out if I can assist you further.  Medicare recommends these wellness visits once per year to help you and your care team stay ahead of potential health issues. These visits are designed to focus on prevention, allowing your provider to concentrate on managing your acute and chronic conditions during your regular appointments.  Please note that Annual Wellness Visits do not include a physical exam. Some assessments may be limited, especially if the visit was conducted virtually. If needed, we may recommend a separate in-person follow-up with your provider.  Ongoing Care Seeing your primary care provider every 3 to 6 months helps us  monitor your health and provide consistent, personalized care.   Referrals If a referral was made during today's visit and you haven't received any updates within two weeks, please contact the referred provider directly to check on the status.  Recommended Screenings:  Health Maintenance  Topic Date Due   Medicare Annual Wellness Visit  07/11/2024   COVID-19 Vaccine (5 - 2025-26 season) 07/13/2025*   Breast Cancer Screening  12/24/2024   DEXA scan (bone density measurement)  01/23/2025   DTaP/Tdap/Td vaccine (2 - Td or Tdap) 12/30/2026   Colon Cancer Screening  07/29/2027   Pneumococcal Vaccine for age over 31  Completed   Flu Shot  Completed   Hepatitis C Screening  Completed   Zoster (Shingles) Vaccine  Completed   Meningitis B Vaccine  Aged Out  *Topic was postponed. The date shown is not the original due date.       07/12/2024    8:48 AM  Advanced Directives  Does Patient Have a Medical Advance Directive? No   Advance Care Planning is important because it: Ensures you receive medical care that aligns with your values, goals, and  preferences. Provides guidance to your family and loved ones, reducing the emotional burden of decision-making during critical moments.  Vision: Annual vision screenings are recommended for early detection of glaucoma, cataracts, and diabetic retinopathy. These exams can also reveal signs of chronic conditions such as diabetes and high blood pressure.  Dental: Annual dental screenings help detect early signs of oral cancer, gum disease, and other conditions linked to overall health, including heart disease and diabetes.  Please see the attached documents for additional preventive care recommendations.

## 2024-07-16 ENCOUNTER — Ambulatory Visit: Payer: Self-pay | Admitting: Nurse Practitioner

## 2024-07-16 ENCOUNTER — Encounter: Payer: Self-pay | Admitting: Nurse Practitioner

## 2024-07-16 VITALS — BP 108/67 | HR 77 | Temp 97.1°F | Ht 64.0 in | Wt 147.0 lb

## 2024-07-16 DIAGNOSIS — E039 Hypothyroidism, unspecified: Secondary | ICD-10-CM

## 2024-07-16 DIAGNOSIS — F5101 Primary insomnia: Secondary | ICD-10-CM | POA: Diagnosis not present

## 2024-07-16 DIAGNOSIS — F411 Generalized anxiety disorder: Secondary | ICD-10-CM

## 2024-07-16 DIAGNOSIS — I10 Essential (primary) hypertension: Secondary | ICD-10-CM

## 2024-07-16 DIAGNOSIS — F3342 Major depressive disorder, recurrent, in full remission: Secondary | ICD-10-CM | POA: Diagnosis not present

## 2024-07-16 DIAGNOSIS — M80079D Age-related osteoporosis with current pathological fracture, unspecified ankle and foot, subsequent encounter for fracture with routine healing: Secondary | ICD-10-CM | POA: Diagnosis not present

## 2024-07-16 DIAGNOSIS — E785 Hyperlipidemia, unspecified: Secondary | ICD-10-CM

## 2024-07-16 DIAGNOSIS — G629 Polyneuropathy, unspecified: Secondary | ICD-10-CM

## 2024-07-16 DIAGNOSIS — G2581 Restless legs syndrome: Secondary | ICD-10-CM

## 2024-07-16 DIAGNOSIS — K219 Gastro-esophageal reflux disease without esophagitis: Secondary | ICD-10-CM | POA: Diagnosis not present

## 2024-07-16 LAB — LIPID PANEL

## 2024-07-16 MED ORDER — BENAZEPRIL-HYDROCHLOROTHIAZIDE 20-12.5 MG PO TABS: ORAL_TABLET | ORAL | 1 refills | Status: AC

## 2024-07-16 MED ORDER — TRAZODONE HCL 50 MG PO TABS: ORAL_TABLET | ORAL | 1 refills | Status: AC

## 2024-07-16 MED ORDER — EZETIMIBE 10 MG PO TABS: 10.0000 mg | ORAL_TABLET | Freq: Every day | ORAL | 1 refills | Status: AC

## 2024-07-16 MED ORDER — ALPRAZOLAM 0.25 MG PO TABS: 0.2500 mg | ORAL_TABLET | Freq: Two times a day (BID) | ORAL | 5 refills | Status: AC | PRN

## 2024-07-16 MED ORDER — LEVOTHYROXINE SODIUM 75 MCG PO TABS: 75.0000 ug | ORAL_TABLET | Freq: Every day | ORAL | 1 refills | Status: AC

## 2024-07-16 MED ORDER — PANTOPRAZOLE SODIUM 20 MG PO TBEC: DELAYED_RELEASE_TABLET | ORAL | 1 refills | Status: AC

## 2024-07-16 MED ORDER — SIMVASTATIN 20 MG PO TABS: ORAL_TABLET | ORAL | 1 refills | Status: AC

## 2024-07-16 MED ORDER — ROPINIROLE HCL 0.25 MG PO TABS: 0.2500 mg | ORAL_TABLET | Freq: Three times a day (TID) | ORAL | 1 refills | Status: AC

## 2024-07-16 MED ORDER — SERTRALINE HCL 50 MG PO TABS: 50.0000 mg | ORAL_TABLET | Freq: Every day | ORAL | 1 refills | Status: AC

## 2024-07-16 MED ORDER — GABAPENTIN 100 MG PO CAPS: ORAL_CAPSULE | ORAL | 1 refills | Status: AC

## 2024-07-16 NOTE — Patient Instructions (Signed)
 Fall Prevention in the Home, Adult Falls can cause injuries and can happen to people of all ages. There are many things you can do to make your home safer and to help prevent falls. What actions can I take to prevent falls? General information Use good lighting in all rooms. Make sure to: Replace any light bulbs that burn out. Turn on the lights in dark areas and use night-lights. Keep items that you use often in easy-to-reach places. Lower the shelves around your home if needed. Move furniture so that there are clear paths around it. Do not use throw rugs or other things on the floor that can make you trip. If any of your floors are uneven, fix them. Add color or contrast paint or tape to clearly mark and help you see: Grab bars or handrails. First and last steps of staircases. Where the edge of each step is. If you use a ladder or stepladder: Make sure that it is fully opened. Do not climb a closed ladder. Make sure the sides of the ladder are locked in place. Have someone hold the ladder while you use it. Know where your pets are as you move through your home. What can I do in the bathroom?     Keep the floor dry. Clean up any water on the floor right away. Remove soap buildup in the bathtub or shower. Buildup makes bathtubs and showers slippery. Use non-skid mats or decals on the floor of the bathtub or shower. Attach bath mats securely with double-sided, non-slip rug tape. If you need to sit down in the shower, use a non-slip stool. Install grab bars by the toilet and in the bathtub and shower. Do not use towel bars as grab bars. What can I do in the bedroom? Make sure that you have a light by your bed that is easy to reach. Do not use any sheets or blankets on your bed that hang to the floor. Have a firm chair or bench with side arms that you can use for support when you get dressed. What can I do in the kitchen? Clean up any spills right away. If you need to reach something  above you, use a step stool with a grab bar. Keep electrical cords out of the way. Do not use floor polish or wax that makes floors slippery. What can I do with my stairs? Do not leave anything on the stairs. Make sure that you have a light switch at the top and the bottom of the stairs. Make sure that there are handrails on both sides of the stairs. Fix handrails that are broken or loose. Install non-slip stair treads on all your stairs if they do not have carpet. Avoid having throw rugs at the top or bottom of the stairs. Choose a carpet that does not hide the edge of the steps on the stairs. Make sure that the carpet is firmly attached to the stairs. Fix carpet that is loose or worn. What can I do on the outside of my home? Use bright outdoor lighting. Fix the edges of walkways and driveways and fix any cracks. Clear paths of anything that can make you trip, such as tools or rocks. Add color or contrast paint or tape to clearly mark and help you see anything that might make you trip as you walk through a door, such as a raised step or threshold. Trim any bushes or trees on paths to your home. Check to see if handrails are loose  or broken and that both sides of all steps have handrails. Install guardrails along the edges of any raised decks and porches. Have leaves, snow, or ice cleared regularly. Use sand, salt, or ice melter on paths if you live where there is ice and snow during the winter. Clean up any spills in your garage right away. This includes grease or oil spills. What other actions can I take? Review your medicines with your doctor. Some medicines can cause dizziness or changes in blood pressure, which increase your risk of falling. Wear shoes that: Have a low heel. Do not wear high heels. Have rubber bottoms and are closed at the toe. Feel good on your feet and fit well. Use tools that help you move around if needed. These include: Canes. Walkers. Scooters. Crutches. Ask  your doctor what else you can do to help prevent falls. This may include seeing a physical therapist to learn to do exercises to move better and get stronger. Where to find more information Centers for Disease Control and Prevention, STEADI: TonerPromos.no [employer] on Aging: BaseRingTones.pl National Institute on Aging: BaseRingTones.pl Contact a doctor if: You are afraid of falling at home. You feel weak, drowsy, or dizzy at home. You fall at home. Get help right away if you: Lose consciousness or have trouble moving after a fall. Have a fall that causes a head injury. These symptoms may be an emergency. Get help right away. Call 911. Do not wait to see if the symptoms will go away. Do not drive yourself to the hospital. This information is not intended to replace advice given to you by your health care provider. Make sure you discuss any questions you have with your health care provider. Document Revised: 05/02/2022 Document Reviewed: 05/02/2022 Elsevier Patient Education  2024 [employer].

## 2024-07-16 NOTE — Progress Notes (Signed)
 Subjective:    Patient ID: Phyllis Walker, female    DOB: 11-14-1945, 78 y.o.   MRN: [id number]   Chief Complaint: medical management of chronic issues      HPI:  Phyllis Walker is a 78 y.o. who identifies as a female who was assigned female at birth.   Social history: Lives with: husband- he has been taking treatments for cancer , so she has had a lot on her. Work history: retired   Water Engineer in today for follow up of the following chronic medical issues:  1. Primary hypertension No c/o chest pain, sob or headache. Does not check blood pressure at home. BP Readings from Last 3 Encounters:  07/12/24 112/70  07/01/24 112/70  06/17/24 99/63    2. Hyperlipidemia with target LDL less than 100 Does not really watch diet and does no dedicated exercise. Lab Results  Component Value Date   CHOL 163 07/17/2023   HDL 43 07/17/2023   LDLCALC 99 07/17/2023   TRIG 113 07/17/2023   CHOLHDL 3.8 07/17/2023   The 10-year ASCVD risk score (Arnett DK, et al., 2019) is: 21.8%   3. Acquired hypothyroidism No issues that she is aware of. Lab Results  Component Value Date   TSH 1.480 07/17/2023     4. Recurrent major depressive disorder, in full remission ([facility] She has been on zoloft  for several years. Is doing ok.    07/12/2024    8:48 AM 06/17/2024    1:35 PM 01/15/2024    9:39 AM  Depression screen PHQ 2/9  Decreased Interest 0 1 1  Down, Depressed, Hopeless 0 1 1  PHQ - 2 Score 0 2 2  Altered sleeping 0 1 0  Tired, decreased energy 0 1 2  Change in appetite 0 1 2  Feeling bad or failure about yourself  0 0 1  Trouble concentrating 0 0 0  Moving slowly or fidgety/restless 0 0 0  Suicidal thoughts 0 0 0  PHQ-9 Score 0 5 7  Difficult doing work/chores Not difficult at all Somewhat difficult Not difficult at all    5. GAD (generalized anxiety disorder) Is on xanax  BID. Her stress level has neen higher since her hsband has been on caner treatments.     06/17/2024    1:35 PM 01/15/2024    9:40 AM 07/17/2023    7:56 AM 01/13/2023    8:31 AM  GAD 7 : Generalized Anxiety Score  Nervous, Anxious, on Edge 1 1 0 0  Control/stop worrying 0 1 0 0  Worry too much - different things 0 1 0 0  Trouble relaxing 0 0 0 0  Restless 0 1 0 0  Easily annoyed or irritable 1 1 0 0  Afraid - awful might happen 0 1 0 0  Total GAD 7 Score 2 6 0 0  Anxiety Difficulty Somewhat difficult Not difficult at all Not difficult at all Not difficult at all      6. Anemia, unspecified type Is on daily iron  supplement Lab Results  Component Value Date   HGB 11.4 07/17/2023     7. Primary insomnia Xanax  usually helps her to sleep at night  8. Neuropathy Is on lyrica  and still has lower ext pain at times  9. Restless leg syndrome Is on requip  which is working well for her  10. Osteoporosis with pathological fracture of ankle and foot, unspecified laterality, with routine healing, subsequent encounter No weight bearing exercises. Last dexascan was done on  01/24/23. T score was -3.3.  11. BMI 31.0-31.9,adult Weight is down 4lbs Wt Readings from Last 3 Encounters:  07/12/24 143 lb (64.9 kg)  07/01/24 143 lb (64.9 kg)  06/17/24 146 lb (66.2 kg)   BMI Readings from Last 3 Encounters:  07/12/24 24.55 kg/m  07/01/24 24.55 kg/m  06/17/24 25.06 kg/m     New complaints: None today  Allergies  Allergen Reactions   Amoxicillin-Pot Clavulanate Nausea And Vomiting and Other (See Comments)    STOMACH CRAMPS  Has patient had a PCN reaction causing immediate rash, facial/tongue/throat swelling, SOB or lightheadedness with hypotension: No Has patient had a PCN reaction causing severe rash involving mucus membranes or skin necrosis: No Has patient had a PCN reaction that required hospitalization No Has patient had a PCN reaction occurring within the last 10 years: Yes If all of the above answers are NO, then may proceed with Cephalosporin use.     Outpatient Encounter Medications as of 07/16/2024  Medication Sig   albuterol  (VENTOLIN  HFA) 108 (90 Base) MCG/ACT inhaler Inhale 2 puffs into the lungs every 6 (six) hours as needed for wheezing or shortness of breath.   alendronate  (FOSAMAX ) 70 MG tablet Take 1 tablet (70 mg total) by mouth every 7 (seven) days. Take with a full glass of water on an empty stomach.   ALPRAZolam  (XANAX ) 0.25 MG tablet Take 1 tablet (0.25 mg total) by mouth 2 (two) times daily as needed for anxiety.   benazepril -hydrochlorthiazide (LOTENSIN  HCT) 20-12.5 MG tablet 1/2 tablet Orally Once a day   cephALEXin  (KEFLEX ) 500 MG capsule Take 1 capsule (500 mg total) by mouth 2 (two) times daily.   ciprofloxacin  (CIPRO ) 500 MG tablet Take 1 tablet (500 mg total) by mouth 2 (two) times daily. (Patient not taking: Reported on 07/12/2024)   diclofenac  (VOLTAREN ) 75 MG EC tablet Take 1 tablet (75 mg total) by mouth 2 (two) times daily.   ezetimibe  (ZETIA ) 10 MG tablet Take 1 tablet (10 mg total) by mouth daily.   ferrous sulfate  (FEROSUL) 325 (65 FE) MG tablet Take 1 tablet (325 mg total) by mouth daily.   gabapentin  (NEURONTIN ) 100 MG capsule TAKE ONE CAPSULE THREE TIMES DAILY   letrozole  (FEMARA ) 2.5 MG tablet Take 1 tablet (2.5 mg total) by mouth daily.   levothyroxine  (SYNTHROID ) 75 MCG tablet Take 1 tablet (75 mcg total) by mouth daily.   pantoprazole  (PROTONIX ) 20 MG tablet 1 tablet Orally Once a day 30 minutes before breakfast for 90 days   rOPINIRole  (REQUIP ) 0.25 MG tablet Take 1 tablet (0.25 mg total) by mouth 3 (three) times daily.   sertraline  (ZOLOFT ) 50 MG tablet Take 1 tablet (50 mg total) by mouth daily.   simvastatin  (ZOCOR ) 20 MG tablet TAKE ONE TABLET EVERY EVENING ONCE DAILY   traZODone  (DESYREL ) 50 MG tablet TAKE ONE-HALF TO 1 TABLET AT BEDTIME AS NEEDED FOR SLEEP   Facility-Administered Encounter Medications as of 07/16/2024  Medication   cyanocobalamin  ((VITAMIN B-12)) injection 1,000 mcg    methylPREDNISolone  acetate (DEPO-MEDROL ) injection 80 mg    Past Surgical History:  Procedure Laterality Date   BREAST LUMPECTOMY  1987   benign fibroid tumor   BREAST LUMPECTOMY WITH RADIOACTIVE SEED AND SENTINEL LYMPH NODE BIOPSY Right 07/05/2018   Procedure: RIGHT BREAST LUMPECTOMY WITH RADIOACTIVE SEED X 2 AND RIGHT AXILLARY SENTINEL LYMPH NODE BIOPSY;  Surgeon: Ethyl Lenis, MD;  Location: [facility];  Service: General;  Laterality: Right;   BUNIONECTOMY Bilateral  CATARACT EXTRACTION     COLONOSCOPY     THYROIDECTOMY, PARTIAL  1969   TONSILLECTOMY     TOTAL KNEE ARTHROPLASTY Right 08/15/2016   Procedure: TOTAL KNEE ARTHROPLASTY;  Surgeon: Marcey Raman, MD;  Location: [facility] OR;  Service: Orthopedics;  Laterality: Right;  RNFA    Family History  Problem Relation Age of Onset   Cancer Mother        pancreas   Heart attack Father        Died age 8   Hypertension Sister    Hyperlipidemia Sister    Anuerysm Sister    Hypertension Sister    Hyperlipidemia Sister    Early death Sister 5       accident   Ovarian cancer Maternal Aunt    Breast cancer Neg Hx       Controlled substance contract: n/a     Review of Systems  Constitutional:  Negative for diaphoresis.  Eyes:  Negative for pain.  Respiratory:  Negative for shortness of breath.   Cardiovascular:  Negative for chest pain, palpitations and leg swelling.  Gastrointestinal:  Negative for abdominal pain.  Endocrine: Negative for polydipsia.  Skin:  Negative for rash.  Neurological:  Negative for dizziness, weakness and headaches.  Hematological:  Does not bruise/bleed easily.  All other systems reviewed and are negative.      Objective:   Physical Exam Vitals and nursing note reviewed.  Constitutional:      General: She is not in acute distress.    Appearance: Normal appearance. She is well-developed.  HENT:     Head: Normocephalic.     Right Ear: Tympanic membrane normal.     Left Ear:  Tympanic membrane normal.     Nose: Nose normal.     Mouth/Throat:     Mouth: Mucous membranes are moist.  Eyes:     Pupils: Pupils are equal, round, and reactive to light.  Neck:     Vascular: No carotid bruit or JVD.  Cardiovascular:     Rate and Rhythm: Normal rate and regular rhythm.     Heart sounds: Normal heart sounds.  Pulmonary:     Effort: Pulmonary effort is normal. No respiratory distress.     Breath sounds: Normal breath sounds. No wheezing or rales.  Chest:     Chest wall: No tenderness.  Abdominal:     General: Bowel sounds are normal. There is no distension or abdominal bruit.     Palpations: Abdomen is soft. There is no hepatomegaly, splenomegaly, mass or pulsatile mass.     Tenderness: There is no abdominal tenderness.  Musculoskeletal:        General: Normal range of motion.     Cervical back: Normal range of motion and neck supple.  Lymphadenopathy:     Cervical: No cervical adenopathy.  Skin:    General: Skin is warm and dry.  Neurological:     Mental Status: She is alert and oriented to person, place, and time.     Deep Tendon Reflexes: Reflexes are normal and symmetric.  Psychiatric:        Behavior: Behavior normal.        Thought Content: Thought content normal.        Judgment: Judgment normal.     BP 108/67   Pulse 77   Temp (!) 97.1 F (36.2 C) (Temporal)   Ht 5' 4 (1.626 m)   Wt 147 lb (66.7 kg)   SpO2 96%   BMI  25.23 kg/m          Assessment & Plan:   Phyllis Walker comes in today with chief complaint of annual physical  Diagnosis and orders addressed:  1. Primary hypertension Low sodium diet - benazepril -hydrochlorthiazide (LOTENSIN  HCT) 20-12.5 MG tablet; 1/2 tablet Orally Once a day  Dispense: 90 tablet; Refill: 1 - CBC with Differential/Platelet - CMP14+EGFR  2. Hyperlipidemia with target LDL less than 100 Low fat diet - ezetimibe  (ZETIA ) 10 MG tablet; Take 1 tablet (10 mg total) by mouth daily.  Dispense: 90  tablet; Refill: 1 - simvastatin  (ZOCOR ) 20 MG tablet; TAKE ONE TABLET EVERY EVENING ONCE DAILY  Dispense: 90 tablet; Refill: 1 - Lipid panel  3. Acquired hypothyroidism Labs pending  - levothyroxine  (SYNTHROID ) 75 MCG tablet; Take 1 tablet (75 mcg total) by mouth daily.  Dispense: 90 tablet; Refill: 1 - Thyroid  Panel With TSH  4. Recurrent major depressive disorder, in full remission (HCC) Stress management - sertraline  (ZOLOFT ) 50 MG tablet; Take 1 tablet (50 mg total) by mouth daily.  Dispense: 90 tablet; Refill: 1  5. GAD (generalized anxiety disorder) - ALPRAZolam  (XANAX ) 0.25 MG tablet; Take 1 tablet (0.25 mg total) by mouth 2 (two) times daily as needed for anxiety.  Dispense: 60 tablet; Refill: 5  6. Anemia, unspecified type Labs pending - ferrous sulfate  (FEROSUL) 325 (65 FE) MG tablet; Take 1 tablet (325 mg total) by mouth daily.  Dispense: 90 tablet; Refill: 1  7. Primary insomnia Bedtime routine - traZODone  (DESYREL ) 50 MG tablet; TAKE ONE-HALF TO 1 TABLET AT BEDTIME AS NEEDED FOR SLEEP  Dispense: 90 tablet; Refill: 1  8. Neuropathy Fdo not go barefooted - gabapentin  (NEURONTIN ) 100 MG capsule; TAKE ONE CAPSULE THREE TIMES DAILY  Dispense: 270 capsule; Refill: 1 - pregabalin  (LYRICA ) 50 MG capsule; 1 capsule Orally Twice a day  Dispense: 90 capsule; Refill: 1  9. Osteoporosis with pathological fracture of ankle and foot, unspecified laterality, with routine healing, subsequent encounter Weight bearing exercises - alendronate  (FOSAMAX ) 70 MG tablet; Take 1 tablet (70 mg total) by mouth every 7 (seven) days. Take with a full glass of water on an empty stomach.  Dispense: 4 tablet; Refill: 11  10. BMI 31.0-31.9,adult Discussed diet and exercise for person with BMI >25 Will recheck weight in 3-6 months   11. Restless leg syndrome Keep  legs warm at night - rOPINIRole  (REQUIP ) 0.25 MG tablet; Take 1 tablet (0.25 mg total) by mouth 3 (three) times daily.  Dispense: 270  tablet; Refill: 1   Labs pending Health Maintenance reviewed Diet and exercise encouraged  Follow up plan: 6 months   Mary-Margaret Gladis, FNP

## 2024-07-17 LAB — LIPID PANEL
Cholesterol, Total: 135 mg/dL (ref 100–199)
HDL: 53 mg/dL (ref >39)
LDL CALC COMMENT:: 2.5 ratio (ref 0.0–4.4)
LDL Chol Calc (NIH): 68 mg/dL (ref 0–99)
Triglycerides: 66 mg/dL (ref 0–149)
VLDL Cholesterol Cal: 14 mg/dL (ref 5–40)

## 2024-07-17 LAB — CBC WITH DIFFERENTIAL/PLATELET
Basophils Absolute: 0.1 x10E3/uL (ref 0.0–0.2)
Basos: 1 %
EOS (ABSOLUTE): 0.4 x10E3/uL (ref 0.0–0.4)
Eos: 6 %
Hematocrit: 33.8 % — ABNORMAL LOW (ref 34.0–46.6)
Hemoglobin: 11.1 g/dL (ref 11.1–15.9)
Immature Grans (Abs): 0 x10E3/uL (ref 0.0–0.1)
Immature Granulocytes: 0 %
Lymphocytes Absolute: 1.3 x10E3/uL (ref 0.7–3.1)
Lymphs: 23 %
MCH: 31.4 pg (ref 26.6–33.0)
MCHC: 32.8 g/dL (ref 31.5–35.7)
MCV: 96 fL (ref 79–97)
Monocytes Absolute: 0.4 x10E3/uL (ref 0.1–0.9)
Monocytes: 6 %
Neutrophils Absolute: 3.7 x10E3/uL (ref 1.4–7.0)
Neutrophils: 64 %
Platelets: 160 x10E3/uL (ref 150–450)
RBC: 3.54 x10E6/uL — ABNORMAL LOW (ref 3.77–5.28)
RDW: 12.2 % (ref 11.7–15.4)
WBC: 5.8 x10E3/uL (ref 3.4–10.8)

## 2024-07-17 LAB — CMP14+EGFR
ALT: 10 IU/L (ref 0–32)
AST: 17 IU/L (ref 0–40)
Albumin: 4.1 g/dL (ref 3.8–4.8)
Alkaline Phosphatase: 72 IU/L (ref 49–135)
BUN/Creatinine Ratio: 16 (ref 12–28)
BUN: 21 mg/dL (ref 8–27)
Bilirubin Total: 0.4 mg/dL (ref 0.0–1.2)
CO2: 23 mmol/L (ref 20–29)
Calcium: 8.6 mg/dL — AB (ref 8.7–10.3)
Chloride: 109 mmol/L — AB (ref 96–106)
Creatinine, Ser: 1.33 mg/dL — AB (ref 0.57–1.00)
Globulin, Total: 1.8 g/dL (ref 1.5–4.5)
Glucose: 104 mg/dL — AB (ref 70–99)
Potassium: 4.4 mmol/L (ref 3.5–5.2)
Sodium: 147 mmol/L — AB (ref 134–144)
Total Protein: 5.9 g/dL — AB (ref 6.0–8.5)
eGFR: 41 mL/min/1.73 — AB (ref >59)

## 2024-07-17 LAB — THYROID PANEL WITH TSH
Free Thyroxine Index: 1.9 (ref 1.2–4.9)
T3 Uptake Ratio: 24 % (ref 24–39)
T4, Total: 8 ug/dL (ref 4.5–12.0)
TSH: 1.41 u[IU]/mL (ref 0.450–4.500)

## 2024-07-18 ENCOUNTER — Ambulatory Visit: Payer: Self-pay | Admitting: Nurse Practitioner

## 2024-07-25 ENCOUNTER — Ambulatory Visit: Admitting: Nurse Practitioner

## 2024-07-26 ENCOUNTER — Ambulatory Visit: Payer: Self-pay

## 2024-07-26 DIAGNOSIS — E538 Deficiency of other specified B group vitamins: Secondary | ICD-10-CM

## 2024-07-26 NOTE — Progress Notes (Signed)
 Patient is in office today for a nurse visit for B12 Injection. Injection was given in the  Left deltoid. Patient tolerated injection well.

## 2024-08-26 ENCOUNTER — Ambulatory Visit (INDEPENDENT_AMBULATORY_CARE_PROVIDER_SITE_OTHER): Admitting: *Deleted

## 2024-08-26 DIAGNOSIS — E538 Deficiency of other specified B group vitamins: Secondary | ICD-10-CM

## 2024-08-26 NOTE — Progress Notes (Signed)
 Patient is in office today for a nurse visit for B12 Injection. Patient Injection was given in the  Left deltoid. Patient tolerated injection well.

## 2024-08-27 ENCOUNTER — Ambulatory Visit

## 2024-09-18 ENCOUNTER — Other Ambulatory Visit: Payer: Self-pay | Admitting: Nurse Practitioner

## 2024-09-26 ENCOUNTER — Ambulatory Visit (INDEPENDENT_AMBULATORY_CARE_PROVIDER_SITE_OTHER): Admitting: *Deleted

## 2024-09-26 DIAGNOSIS — E538 Deficiency of other specified B group vitamins: Secondary | ICD-10-CM

## 2024-09-26 NOTE — Progress Notes (Signed)
 Patient is in office today for a nurse visit for B12 Injection. Patient Injection was given in the  Left deltoid. Patient tolerated injection well.

## 2024-10-08 ENCOUNTER — Other Ambulatory Visit: Payer: Self-pay | Admitting: Nurse Practitioner

## 2024-10-08 DIAGNOSIS — D649 Anemia, unspecified: Secondary | ICD-10-CM

## 2024-10-28 ENCOUNTER — Ambulatory Visit

## 2024-12-12 ENCOUNTER — Ambulatory Visit: Admitting: Nurse Practitioner
